# Patient Record
Sex: Male | Born: 1945 | Race: White | Hispanic: No | Smoking: Former smoker
Health system: Southern US, Community
[De-identification: ages and names within clinical notes are randomized; demographics above are authoritative.]

## PROBLEM LIST (undated history)

## (undated) DIAGNOSIS — I252 Old myocardial infarction: Secondary | ICD-10-CM

## (undated) DIAGNOSIS — I509 Heart failure, unspecified: Secondary | ICD-10-CM

## (undated) DIAGNOSIS — I739 Peripheral vascular disease, unspecified: Secondary | ICD-10-CM

## (undated) DIAGNOSIS — K5730 Diverticulosis of large intestine without perforation or abscess without bleeding: Secondary | ICD-10-CM

## (undated) DIAGNOSIS — Z860100 Personal history of colon polyps, unspecified: Secondary | ICD-10-CM

## (undated) DIAGNOSIS — C61 Malignant neoplasm of prostate: Secondary | ICD-10-CM

## (undated) DIAGNOSIS — H9190 Unspecified hearing loss, unspecified ear: Secondary | ICD-10-CM

## (undated) DIAGNOSIS — I2510 Atherosclerotic heart disease of native coronary artery without angina pectoris: Secondary | ICD-10-CM

## (undated) DIAGNOSIS — Z8719 Personal history of other diseases of the digestive system: Secondary | ICD-10-CM

## (undated) DIAGNOSIS — M1990 Unspecified osteoarthritis, unspecified site: Secondary | ICD-10-CM

## (undated) DIAGNOSIS — E049 Nontoxic goiter, unspecified: Secondary | ICD-10-CM

## (undated) DIAGNOSIS — Z955 Presence of coronary angioplasty implant and graft: Secondary | ICD-10-CM

## (undated) DIAGNOSIS — I10 Essential (primary) hypertension: Secondary | ICD-10-CM

## (undated) DIAGNOSIS — Z87442 Personal history of urinary calculi: Secondary | ICD-10-CM

## (undated) DIAGNOSIS — R7989 Other specified abnormal findings of blood chemistry: Secondary | ICD-10-CM

## (undated) DIAGNOSIS — H02409 Unspecified ptosis of unspecified eyelid: Secondary | ICD-10-CM

## (undated) DIAGNOSIS — J449 Chronic obstructive pulmonary disease, unspecified: Secondary | ICD-10-CM

## (undated) HISTORY — DX: Malignant neoplasm of prostate: C61

## (undated) HISTORY — DX: Peripheral vascular disease, unspecified: I73.9

## (undated) HISTORY — DX: Chronic obstructive pulmonary disease, unspecified: J44.9

## (undated) HISTORY — DX: Unspecified ptosis of unspecified eyelid: H02.409

## (undated) HISTORY — PX: CORONARY ARTERY BYPASS GRAFT: SHX141

## (undated) HISTORY — PX: TONSILLECTOMY: SUR1361

## (undated) HISTORY — PX: CARDIOVASCULAR STRESS TEST: SHX262

## (undated) HISTORY — DX: Atherosclerotic heart disease of native coronary artery without angina pectoris: I25.10

## (undated) HISTORY — DX: Essential (primary) hypertension: I10

## (undated) HISTORY — PX: PROSTATE BIOPSY: SHX241

## (undated) HISTORY — DX: Other specified abnormal findings of blood chemistry: R79.89

## (undated) HISTORY — PX: CORONARY ANGIOPLASTY WITH STENT PLACEMENT: SHX49

## (undated) HISTORY — PX: CARDIAC CATHETERIZATION: SHX172

---

## 1971-01-12 HISTORY — PX: APPENDECTOMY: SHX54

## 1981-01-11 HISTORY — PX: TOTAL HIP ARTHROPLASTY: SHX124

## 1988-09-11 HISTORY — PX: OTHER SURGICAL HISTORY: SHX169

## 1994-01-11 HISTORY — PX: EYE SURGERY: SHX253

## 2000-03-26 ENCOUNTER — Emergency Department (HOSPITAL_COMMUNITY): Admission: EM | Admit: 2000-03-26 | Discharge: 2000-03-26 | Payer: Self-pay | Admitting: *Deleted

## 2004-01-12 HISTORY — PX: CATARACT EXTRACTION W/ INTRAOCULAR LENS  IMPLANT, BILATERAL: SHX1307

## 2004-07-17 ENCOUNTER — Inpatient Hospital Stay (HOSPITAL_COMMUNITY): Admission: AD | Admit: 2004-07-17 | Discharge: 2004-07-30 | Payer: Self-pay | Admitting: Cardiology

## 2004-07-17 ENCOUNTER — Encounter: Payer: Self-pay | Admitting: Emergency Medicine

## 2004-07-19 ENCOUNTER — Ambulatory Visit: Payer: Self-pay | Admitting: Cardiology

## 2004-07-20 ENCOUNTER — Encounter: Payer: Self-pay | Admitting: Internal Medicine

## 2004-07-21 ENCOUNTER — Ambulatory Visit: Payer: Self-pay | Admitting: Pulmonary Disease

## 2004-08-12 ENCOUNTER — Ambulatory Visit (HOSPITAL_COMMUNITY): Admission: RE | Admit: 2004-08-12 | Discharge: 2004-08-12 | Payer: Self-pay | Admitting: *Deleted

## 2004-08-12 ENCOUNTER — Ambulatory Visit: Payer: Self-pay | Admitting: Cardiology

## 2004-08-23 ENCOUNTER — Inpatient Hospital Stay (HOSPITAL_COMMUNITY): Admission: EM | Admit: 2004-08-23 | Discharge: 2004-08-29 | Payer: Self-pay | Admitting: Emergency Medicine

## 2004-08-23 ENCOUNTER — Encounter: Payer: Self-pay | Admitting: Emergency Medicine

## 2004-08-23 ENCOUNTER — Ambulatory Visit: Payer: Self-pay | Admitting: Internal Medicine

## 2004-08-27 ENCOUNTER — Encounter: Payer: Self-pay | Admitting: Cardiology

## 2004-09-01 ENCOUNTER — Ambulatory Visit: Payer: Self-pay | Admitting: Cardiology

## 2004-09-04 ENCOUNTER — Ambulatory Visit: Payer: Self-pay | Admitting: *Deleted

## 2004-09-15 ENCOUNTER — Encounter (HOSPITAL_COMMUNITY): Admission: RE | Admit: 2004-09-15 | Discharge: 2004-10-09 | Payer: Self-pay | Admitting: Cardiology

## 2004-10-12 ENCOUNTER — Encounter (HOSPITAL_COMMUNITY): Admission: RE | Admit: 2004-10-12 | Discharge: 2004-11-11 | Payer: Self-pay | Admitting: Cardiology

## 2004-11-13 ENCOUNTER — Encounter (HOSPITAL_COMMUNITY): Admission: RE | Admit: 2004-11-13 | Discharge: 2004-12-13 | Payer: Self-pay

## 2004-12-14 ENCOUNTER — Encounter (HOSPITAL_COMMUNITY): Admission: RE | Admit: 2004-12-14 | Discharge: 2004-12-23 | Payer: Self-pay | Admitting: Cardiology

## 2005-03-01 ENCOUNTER — Emergency Department (HOSPITAL_COMMUNITY): Admission: EM | Admit: 2005-03-01 | Discharge: 2005-03-01 | Payer: Self-pay | Admitting: Emergency Medicine

## 2005-07-09 ENCOUNTER — Ambulatory Visit: Payer: Self-pay | Admitting: Internal Medicine

## 2005-07-13 ENCOUNTER — Ambulatory Visit (HOSPITAL_COMMUNITY): Admission: RE | Admit: 2005-07-13 | Discharge: 2005-07-13 | Payer: Self-pay | Admitting: Internal Medicine

## 2005-08-06 ENCOUNTER — Ambulatory Visit (HOSPITAL_COMMUNITY): Admission: RE | Admit: 2005-08-06 | Discharge: 2005-08-06 | Payer: Self-pay | Admitting: Internal Medicine

## 2005-08-06 ENCOUNTER — Ambulatory Visit: Payer: Self-pay | Admitting: Internal Medicine

## 2005-09-08 ENCOUNTER — Ambulatory Visit: Payer: Self-pay | Admitting: Internal Medicine

## 2005-09-09 ENCOUNTER — Ambulatory Visit: Payer: Self-pay | Admitting: Cardiology

## 2005-12-08 ENCOUNTER — Emergency Department (HOSPITAL_COMMUNITY): Admission: EM | Admit: 2005-12-08 | Discharge: 2005-12-09 | Payer: Self-pay | Admitting: Emergency Medicine

## 2006-10-26 ENCOUNTER — Ambulatory Visit: Payer: Self-pay | Admitting: Cardiology

## 2007-02-07 IMAGING — CR DG CHEST 1V PORT
1 series · 1 of 1 positions shown · non-contrast
Comparison: 07/24/04

CLINICAL DATA: Post CABG
 PORTABLE CHEST ? ONE VIEW:

[view not recorded]
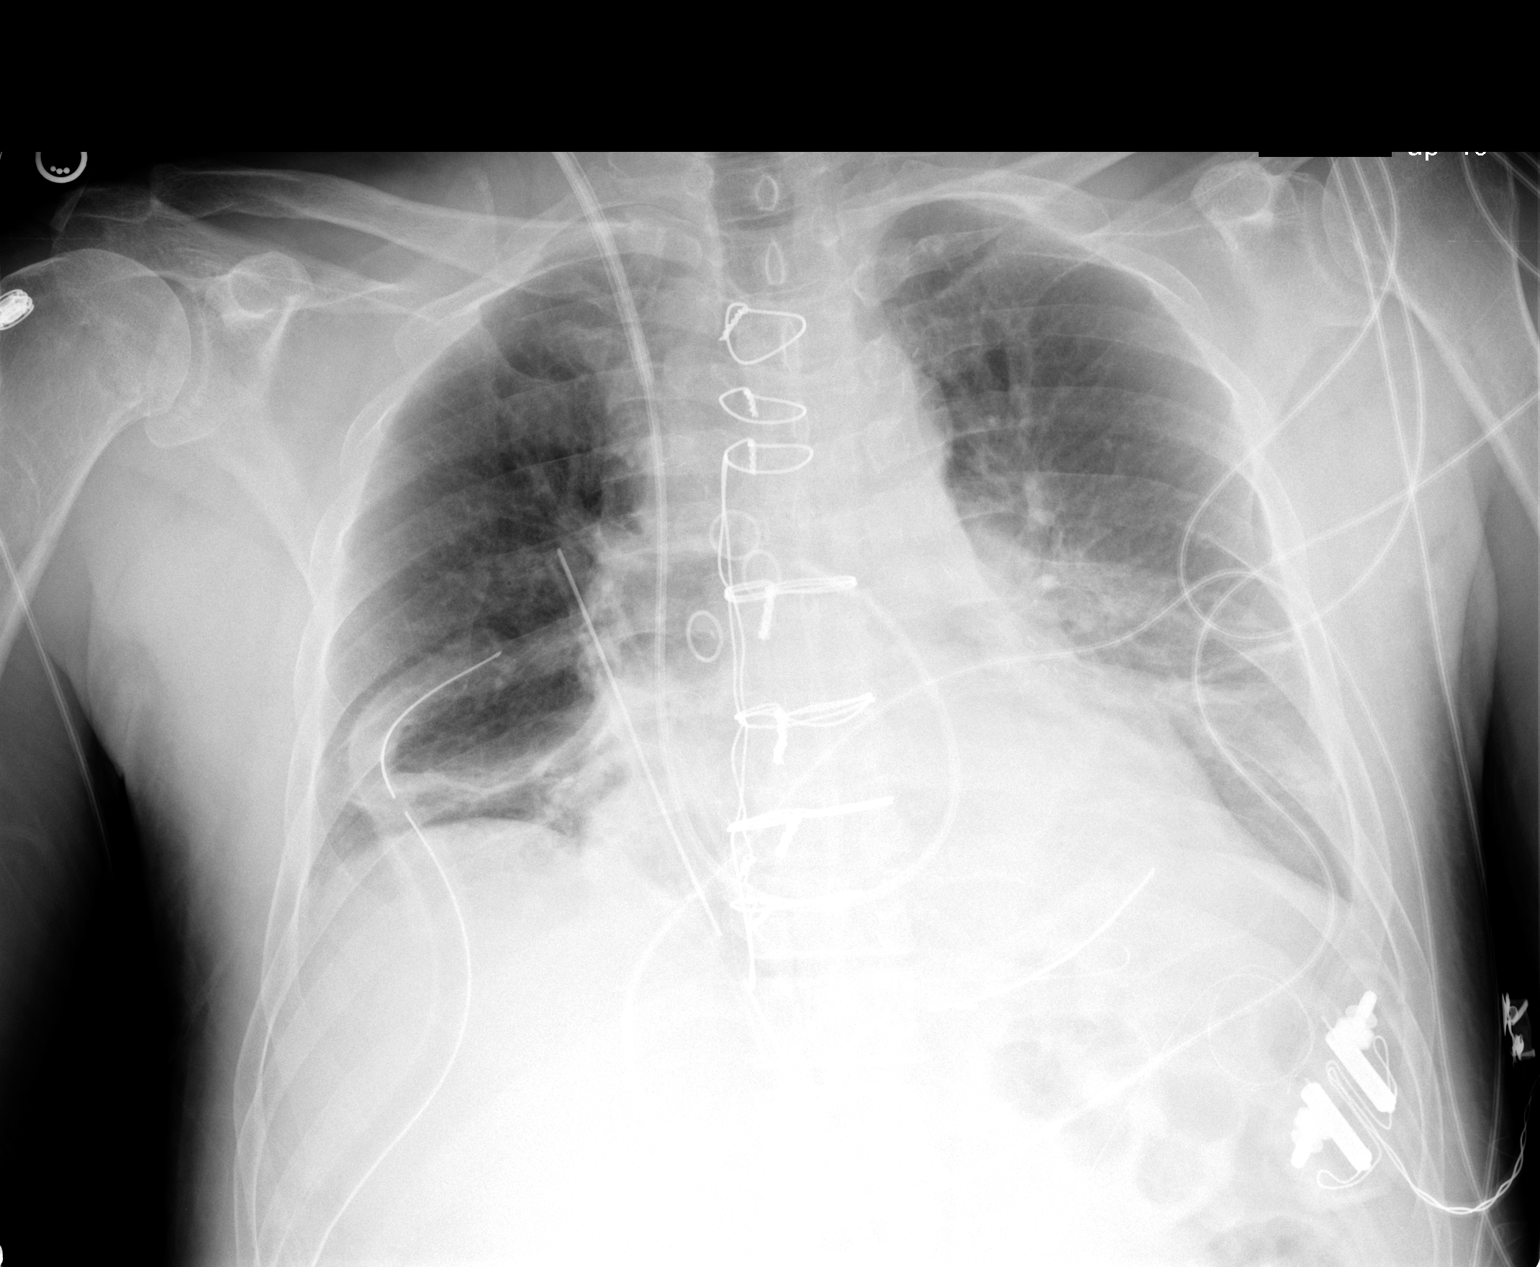

[1 of 1 positions shown; findings below may reference images not displayed]

FINDINGS: Endotracheal tube and NG tube have been removed with increasing basilar atelectasis.  Cardiomegaly, CABG, Swan Ganz catheter, and mediastinal thoracostomy tubes are stable.  Pulmonary vascular congestion and mild interstitial edema is slightly increased.  No evidence of pneumothorax.
IMPRESSION: Endotracheal tube and NG tube removal with mild increase in basilar atelectasis and interstitial edema.

## 2007-02-08 IMAGING — CR DG CHEST 1V PORT
1 series · 1 of 1 positions shown · non-contrast
Comparison: 07/25/04.

CLINICAL DATA: CABG.  
 PORTABLE CHEST - 1 VIEW 07/26/04:

[view not recorded]
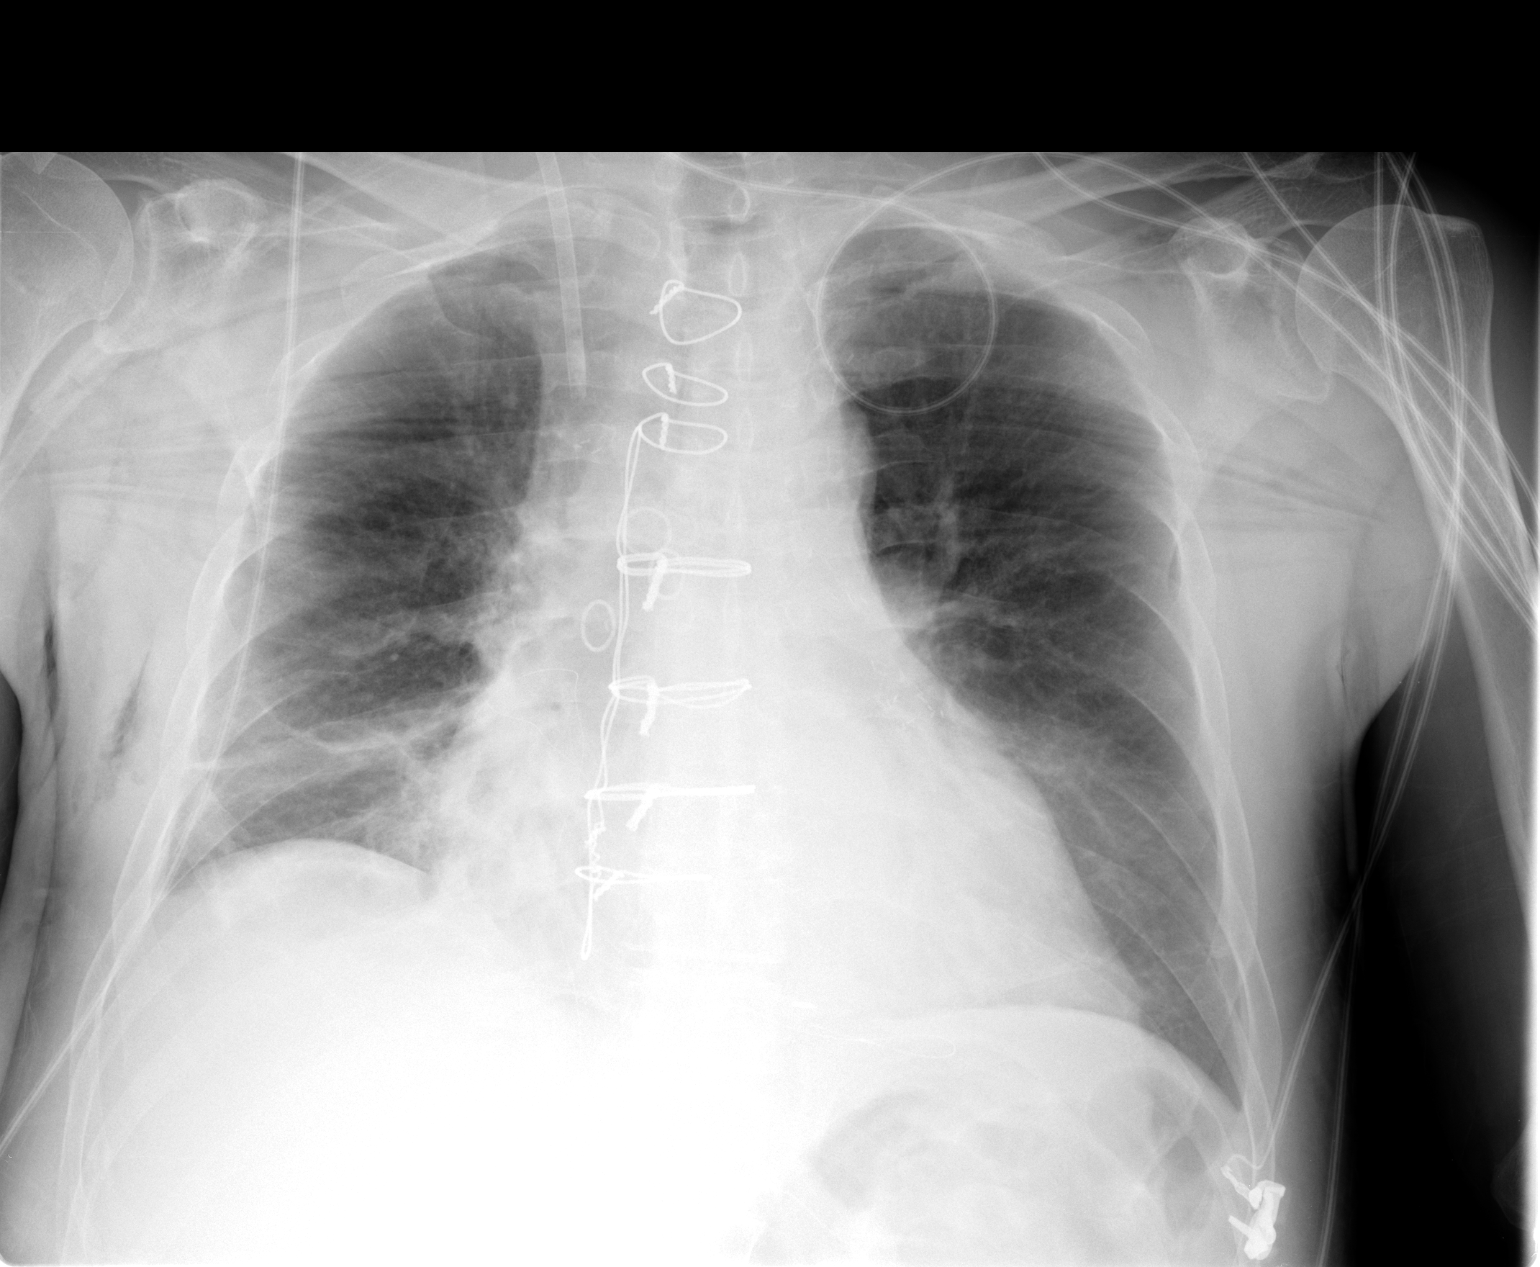

[1 of 1 positions shown; findings below may reference images not displayed]

FINDINGS: Cardiomegaly.  CABG noted.  Improved aeration of the lung bases.  Right thoracostomy tube has been removed.  No evidence of pneumothorax.  Bibasilar subcutaneous emphysema again noted.
IMPRESSION: 1.  Right thoracostomy tube removal with improved basilar aeration. 
 2.  No evidence of pneumothorax.  
 3.  Stable bilateral subcutaneous emphysema.

## 2007-02-09 IMAGING — CR DG CHEST 1V PORT
1 series · 1 of 1 positions shown · non-contrast
Comparison: 07/26/04.

CLINICAL DATA: Coronary artery disease.  Postop from coronary artery bypass grafting.
 PORTABLE [id number] VIEW:

[view not recorded]
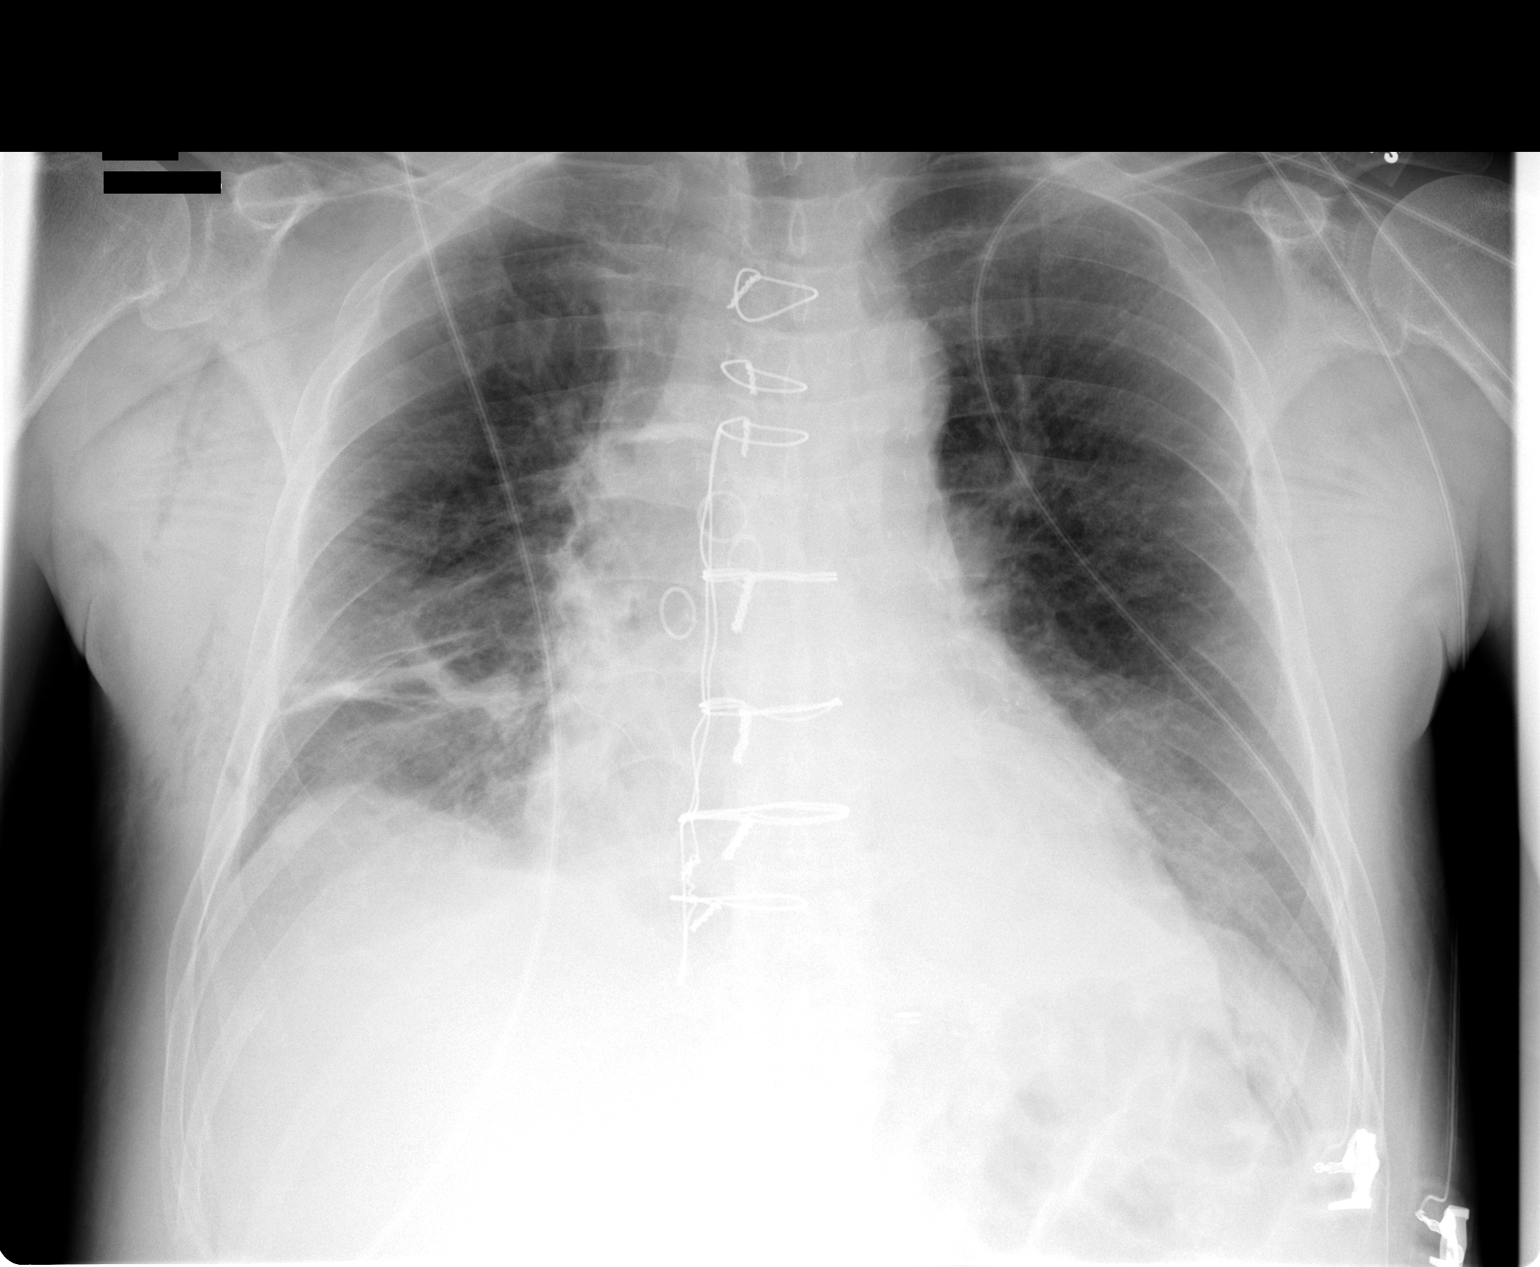

[1 of 1 positions shown; findings below may reference images not displayed]

Mild worsening of atelectasis is seen in the medial left lung base since the prior study.  Right lower lung atelectasis is unchanged.  
 Cardiomegaly is stable.  There is no evidence of congestive heart failure.  No pneumothorax identified.  Right jugular Cordis has been removed.
IMPRESSION: 1.  Mild worsening of atelectasis in the left retrocardiac lung base.  
 2.  Stable right lower lung atelectasis.

## 2007-02-10 IMAGING — CR DG CHEST 2V
2 series · 2 of 2 positions shown · non-contrast
Comparison: 07/27/04.

CLINICAL DATA: CABG.  Chest congestion.  Cough. 
 CHEST - 2 VIEW:

[w chest pa]
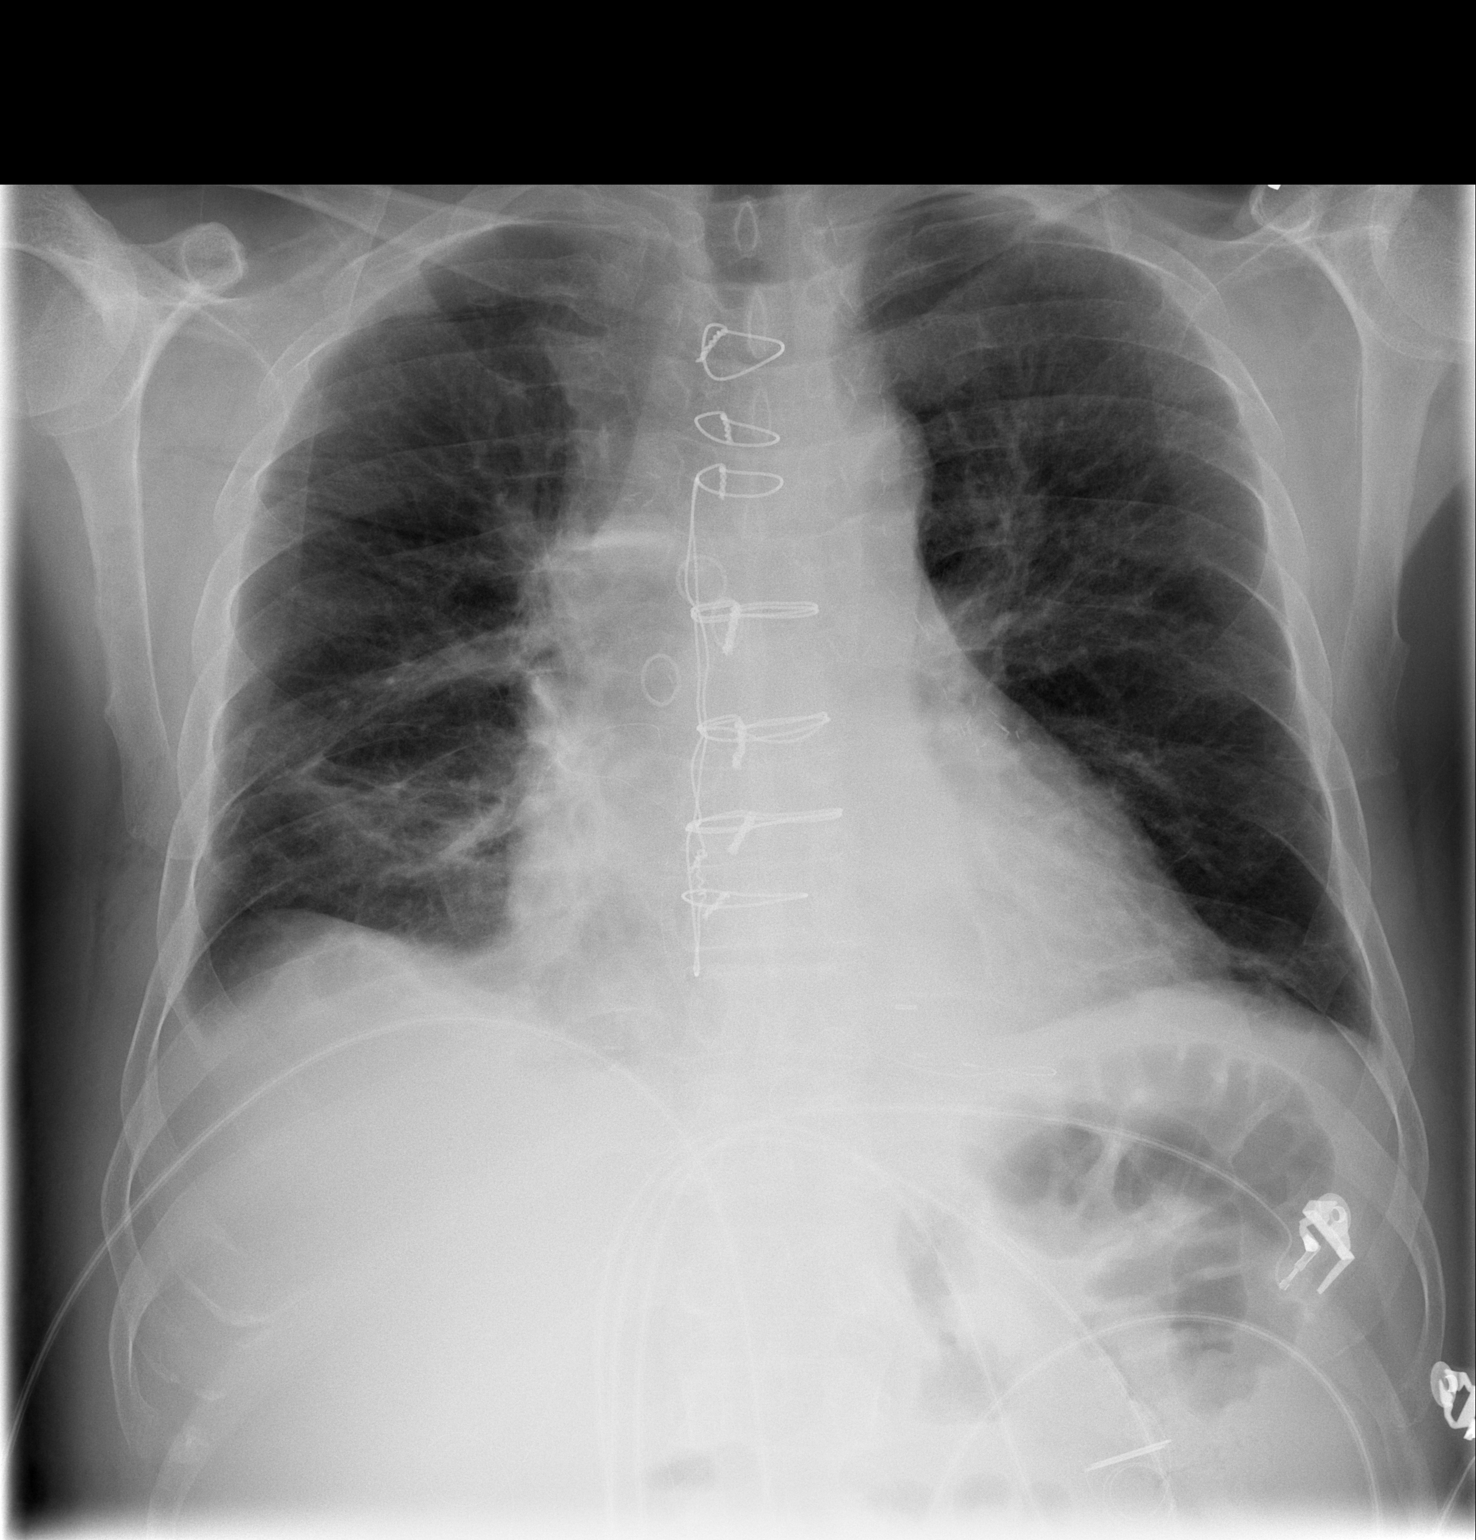

[w chest lat]
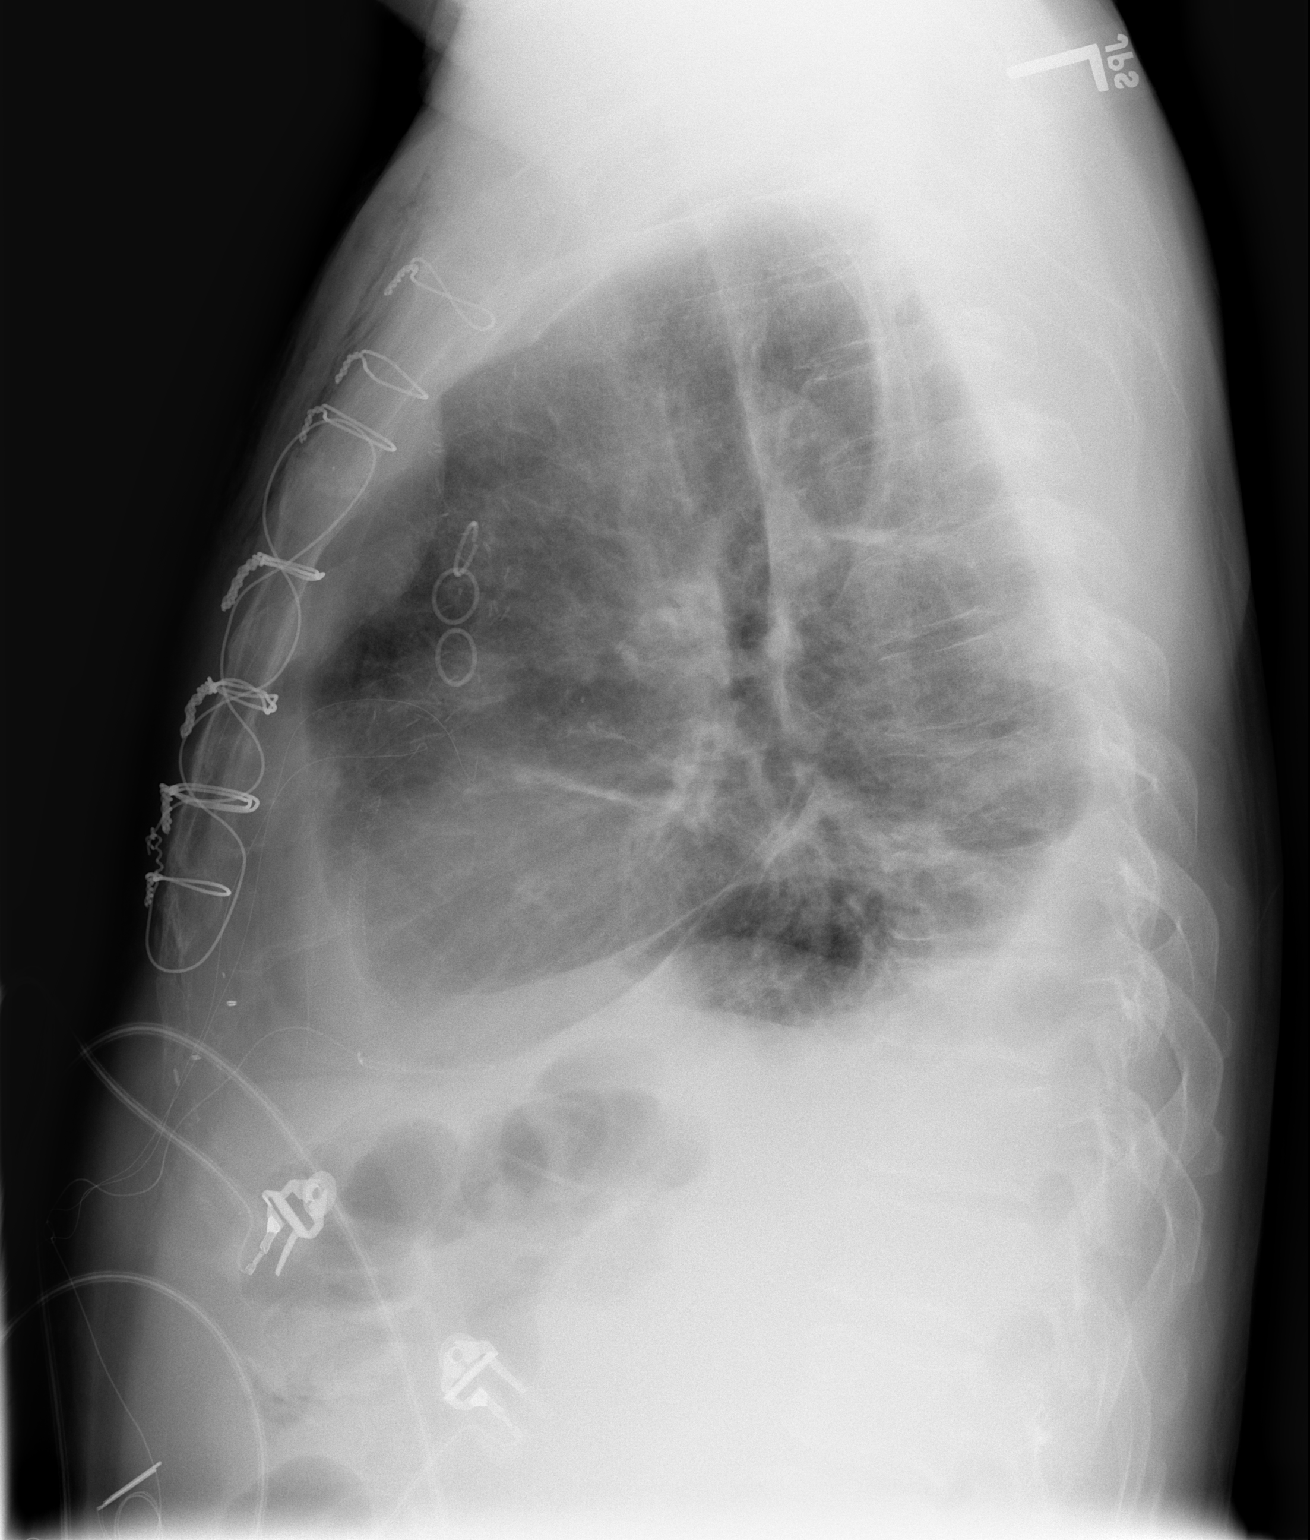

[2 of 2 positions shown; findings below may reference images not displayed]

The patient has had a previous median sternotomy and coronary artery bypass grafting.  There is mild basilar atelectasis, right more than left.  There are small bilateral pleural effusions.  Little change since yesterday's exam.
IMPRESSION: No significant change in effusion and basilar atelectasis.

## 2007-11-14 ENCOUNTER — Ambulatory Visit: Payer: Self-pay | Admitting: Cardiology

## 2008-10-08 ENCOUNTER — Encounter: Payer: Self-pay | Admitting: Cardiology

## 2008-11-04 ENCOUNTER — Encounter (INDEPENDENT_AMBULATORY_CARE_PROVIDER_SITE_OTHER): Payer: Self-pay | Admitting: *Deleted

## 2008-11-04 ENCOUNTER — Encounter: Payer: Self-pay | Admitting: Cardiology

## 2008-11-04 LAB — CONVERTED CEMR LAB
Alkaline Phosphatase: 78 units/L
Alkaline Phosphatase: 78 units/L (ref 39–117)
BUN: 17 mg/dL
CO2: 23 meq/L
Chloride: 106 meq/L
Cholesterol: 92 mg/dL
Cholesterol: 92 mg/dL (ref 0–200)
Creatinine, Ser: 0.99 mg/dL
Glucose, Bld: 97 mg/dL
Glucose, Bld: 97 mg/dL (ref 70–99)
HDL: 38 mg/dL — ABNORMAL LOW (ref >39)
Indirect Bilirubin: 0.7 mg/dL (ref 0.0–0.9)
Sodium: 140 meq/L (ref 135–145)
Total Bilirubin: 1 mg/dL (ref 0.3–1.2)
Total CHOL/HDL Ratio: 2.4
Total Protein: 6.7 g/dL (ref 6.0–8.3)

## 2008-11-06 ENCOUNTER — Encounter (INDEPENDENT_AMBULATORY_CARE_PROVIDER_SITE_OTHER): Payer: Self-pay | Admitting: *Deleted

## 2008-11-25 ENCOUNTER — Encounter: Payer: Self-pay | Admitting: Cardiology

## 2008-12-09 DIAGNOSIS — J449 Chronic obstructive pulmonary disease, unspecified: Secondary | ICD-10-CM

## 2008-12-09 DIAGNOSIS — I10 Essential (primary) hypertension: Secondary | ICD-10-CM

## 2008-12-11 ENCOUNTER — Ambulatory Visit: Payer: Self-pay | Admitting: Cardiology

## 2008-12-23 ENCOUNTER — Encounter (INDEPENDENT_AMBULATORY_CARE_PROVIDER_SITE_OTHER): Payer: Self-pay | Admitting: *Deleted

## 2008-12-25 ENCOUNTER — Encounter: Payer: Self-pay | Admitting: Cardiology

## 2009-09-02 ENCOUNTER — Telehealth (INDEPENDENT_AMBULATORY_CARE_PROVIDER_SITE_OTHER): Payer: Self-pay

## 2009-10-30 ENCOUNTER — Ambulatory Visit (HOSPITAL_COMMUNITY)
Admission: RE | Admit: 2009-10-30 | Discharge: 2009-10-30 | Payer: Self-pay | Source: Home / Self Care | Admitting: Family Medicine

## 2009-10-31 LAB — CONVERTED CEMR LAB
ALT: 13 units/L
Albumin: 3.9 g/dL
BUN: 15 mg/dL
CO2: 23 meq/L
Chloride: 109 meq/L
Glucose, Bld: 104 mg/dL
HCT: 47.3 %
HDL: 32 mg/dL
Hemoglobin: 16.3 g/dL
MCV: 82.3 fL
PSA: 3.68 ng/mL
Platelets: 196 10*3/uL
Potassium: 4.5 meq/L
TSH: 0.874 microintl units/mL
Total Protein: 5.8 g/dL
WBC: 6.7 10*3/uL

## 2009-12-03 ENCOUNTER — Ambulatory Visit: Payer: Self-pay | Admitting: Internal Medicine

## 2009-12-19 ENCOUNTER — Encounter (INDEPENDENT_AMBULATORY_CARE_PROVIDER_SITE_OTHER): Payer: Self-pay | Admitting: *Deleted

## 2009-12-26 ENCOUNTER — Ambulatory Visit (HOSPITAL_COMMUNITY)
Admission: RE | Admit: 2009-12-26 | Discharge: 2009-12-26 | Payer: Self-pay | Source: Home / Self Care | Attending: Internal Medicine | Admitting: Internal Medicine

## 2009-12-26 HISTORY — PX: COLONOSCOPY W/ POLYPECTOMY: SHX1380

## 2009-12-29 ENCOUNTER — Encounter (INDEPENDENT_AMBULATORY_CARE_PROVIDER_SITE_OTHER): Payer: Self-pay | Admitting: *Deleted

## 2009-12-29 ENCOUNTER — Ambulatory Visit: Payer: Self-pay | Admitting: Cardiology

## 2009-12-29 DIAGNOSIS — K635 Polyp of colon: Secondary | ICD-10-CM | POA: Insufficient documentation

## 2009-12-30 ENCOUNTER — Encounter: Payer: Self-pay | Admitting: Cardiology

## 2009-12-31 ENCOUNTER — Encounter: Payer: Self-pay | Admitting: Internal Medicine

## 2010-01-09 ENCOUNTER — Telehealth: Payer: Self-pay | Admitting: Cardiovascular Disease

## 2010-02-01 ENCOUNTER — Encounter: Payer: Self-pay | Admitting: Cardiothoracic Surgery

## 2010-02-12 NOTE — Miscellaneous (Signed)
Summary: LABS CMP,LIVER,11/04/2008  Clinical Lists Changes  Observations: Added new observation of CALCIUM: 9.4 mg/dL (60/45/4098 1:19) Added new observation of ALBUMIN: 4.4 g/dL (14/78/2956 2:13) Added new observation of PROTEIN, TOT: 6.7 g/dL (08/65/7846 9:62) Added new observation of SGPT (ALT): 10 units/L (11/04/2008 8:04) Added new observation of SGOT (AST): 9 units/L (11/04/2008 8:04) Added new observation of ALK PHOS: 78 units/L (11/04/2008 8:04) Added new observation of CREATININE: 0.99 mg/dL (95/28/4132 4:40) Added new observation of BUN: 17 mg/dL (11/07/2534 6:44) Added new observation of BG RANDOM: 97 mg/dL (03/47/4259 5:63) Added new observation of CO2 PLSM/SER: 23 meq/L (11/04/2008 8:04) Added new observation of CL SERUM: 106 meq/L (11/04/2008 8:04) Added new observation of K SERUM: 4.4 meq/L (11/04/2008 8:04) Added new observation of NA: 140 meq/L (11/04/2008 8:04) Added new observation of LDL: 38 mg/dL (87/56/4332 9:51) Added new observation of HDL: 38 mg/dL (88/41/6606 3:01) Added new observation of TRIGLYC TOT: 80 mg/dL (60/10/9321 5:57) Added new observation of CHOLESTEROL: 92 mg/dL (32/20/2542 7:06)

## 2010-02-12 NOTE — Letter (Signed)
Summary: Patient Notice, Colon Biopsy Results  [facility]  [address]   [place], [place] [address]   Phone: [phone]  Fax: [phone]       December 31, 2009   Vincent Carter [address] [place], [place]  [address] 07-23-1945    Dear Vincent Carter,  I am pleased to inform you that the biopsies taken during your recent colonoscopy did not show any evidence of cancer upon pathologic examination.  Additional information/recommendations:  No further action is needed at this time.  Please follow-up with your primary care physician for your other healthcare needs.  You should have a repeat colonoscopy examination  in 10 years.  Please call us if you are having persistent problems or have questions about your condition that have not been fully answered at this time.  Sincerely,    R. Roetta Sessions MD, FACP [facility] Ph: [phone]    Fax: [phone]   Appended Document: Patient Notice, Colon Biopsy Results letter mailed to pt  Appended Document: Patient Notice, Colon Biopsy Results reminder in computer

## 2010-02-12 NOTE — Progress Notes (Signed)
Summary: Refill   Phone Note Call from Patient   Caller: Patient Reason for Call: Refill Medication Summary of Call: pt needs refill on Metoprolol called to [facility]/tg Initial call taken by: Raechel Ache [facility],  September 02, 2009 11:03 AM    Prescriptions: LOPRESSOR 50 MG TABS (METOPROLOL TARTRATE) Take 1/2 tablet by mouth two times a day  #30 x 3   Entered by:   Larita Fife Via LPN   Authorized by:   Kathlen Brunswick, MD, [facility]   Signed by:   Larita Fife Via LPN on 91/47/8295   Method used:   Electronically to        [facility] (retail)       [address] [place], [place]  [address]       Ph: [phone]       Fax: [phone]   RxID:   [id number]

## 2010-02-12 NOTE — Letter (Signed)
Summary: TCS ORDER  TCS ORDER   Imported By: Rexene Alberts 12/03/2009 12:15:20  _____________________________________________________________________  External Attachment:    Type:   Image     Comment:   External Document

## 2010-02-12 NOTE — Assessment & Plan Note (Signed)
Summary: consult for tcs/constipation,diarrhea/ss   Visit Type:  Initial Consult Referring Provider:  Sherwood Gambler Primary Care Provider:  Fusco  Chief Complaint:  diarrhea/ ?TCS.  History of Present Illness: 65 year old gentleman referred out of the courtesy of [facility] for consideration of colonoscopy given his change in bowel habits.  Patient states he has alternating constipation and diarrhea ;may have up to  3 days of nonbloody watery diarrhea ; then   relatively normal bowel function for a week or so and then gets constipated. He has seen a little blood with wiping on occasion; hasn't really had any associated abdominal pain. Last colonoscopy was done back in 2007 by Dr. Karilyn Cota - diverticulosis. He has a history of self-limited bouts of diarrheal illness previously back in the 1990s; he actually had a Giardia infection to account for his diarrhea at that time. No family history of inflammatory bowel disease, polyps or CRC.  Preventive Screening-Counseling & Management  Alcohol-Tobacco     Smoking Status: current     Packs/Day: 0.5  Caffeine-Diet-Exercise     Does Patient Exercise: no  Current Medications (verified): 1)  Lopressor 50 Mg Tabs (Metoprolol Tartrate) .... Take 1/2 Tablet By Mouth Two Times A Day 2)  Aspir-Low 81 Mg Tbec (Aspirin) .... Take 1 Tab Two Times A Day 3)  Fish Oil 300 Mg Caps (Omega-3 Fatty Acids) .... Take 2 Tabs Two Times A Day  Allergies (verified): No Known Drug Allergies  Past History:  Past Medical History: Last updated: 12/11/2008 ASCVD: Coronary artery bypass graft surgery in 1997 with LIMA to the left anterior descending;       redo surgery in 07/2004 for three-vessel disease; cath in 8/06-ejection fraction of 40-45% with       anteroapical hypokinesis; occlusion of the saphenous vein graft to the RI; patent RIMA to left anterior       descending; unsuccessful PCI in totally occluded RI graft. Tobacco abuse-40 pack years  discontinued in 2006; resumed 2009 COPD (ICD-496) HYPERTENSION (ICD-401.9) HYPERLIPIDEMIA (ICD-272.4) Left ptosis secondary to injury Euthyroid with low thyroid-stimulating hormone  Past Surgical History: Last updated: 12/11/2008 Appendectomy ORIF for right tibia and fibular fracture Surgical repair of left ocular injury Coronary artery bypass graft surgery 1997 and 2006  Family History: Last updated: 12/26/08 Father:deceased age 35 due to lung cancer Mother:deceased age 43 yrs heart attack and blood clot Siblings:1 brother alive and well   Social History: Last updated: December 26, 2008 Retired  Widowed  Tobacco Use - No.  Alcohol Use - no Regular Exercise - no Drug Use - no  Risk Factors: Exercise: no (12/03/2009)  Risk Factors: Smoking Status: current (12/03/2009) Packs/Day: 0.5 (12/03/2009)  Social History: Smoking Status:  current Packs/Day:  0.5  Vital Signs:  Patient profile:   65 year old male Height:      62 inches Weight:      145 pounds BMI:     26.62 Temp:     98.5 degrees F oral Pulse rate:   56 / minute BP sitting:   140 / 80  (left arm) Cuff size:   regular  Vitals Entered By: Cloria Spring LPN (December 03, 2009 11:07 AM)  Physical Exam  General:  pleasant alert gentleman resting comfortably Lungs:  clear to auscultation Heart:  regular rate rhythm without murmur gallop well  Impression & Recommendations: Impression: Pleasant 65 year old gentleman with alternating constipation and diarrhea. Occasional paper hematochezia. 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  It  has been some time since he last  had a colonoscopy. I agree, further evaluation is warranted.  Recommendations: Diagnostic colonoscopy the near future. Risks, benefits, limitations, alternatives and imponderables have been reviewed. Patient's questions have been answered. He is agreeable.  Further recommendations after  colonoscopy has been performed.  Appended Document: Orders Update    Clinical Lists Changes  Problems: Added new problem of CHANGE IN BOWELS (ICD-787.99) Added new problem of HEMATOCHEZIA (ICD-578.1) Orders: Added new Service order of New Patient Level III [phone]) - Signed

## 2010-02-12 NOTE — Letter (Signed)
Summary: Patient Notice, Colon Biopsy Results  [facility]  [address]   [place], [place] [address]   Phone: [phone]  Fax: [phone]       December 31, 2009   Vincent Carter [address] [place], [place]  [address] 30-Apr-1945    Dear [name],  I am pleased to inform you that the biopsies taken during your recent colonoscopy did not show any evidence of cancer upon pathologic examination.  Additional information/recommendations:  No further action is needed at this time.  Please follow-up with your primary care physician for your other healthcare needs.  Continue with the treatment plan as outlined on the day of your exam.  You should have a repeat colonoscopy examination  in 10 years.  Please call us if you are having persistent problems or have questions about your condition that have not been fully answered at this time.  Sincerely,    R. Roetta Sessions MD, FACP [facility] Ph: [phone]    Fax: [phone]   Appended Document: Patient Notice, Colon Biopsy Results letter mailed to pt  Appended Document: Patient Notice, Colon Biopsy Results REMINDER IN COMPUTER

## 2010-02-12 NOTE — Letter (Signed)
Summary: Appointment - Reminder 2  [facility] [address]   [place], [place] [address]   Phone: [phone]  Fax: [phone]     December 19, 2009 MRN: [id number]   Vincent Carter [address] [place], [place]  [address]   Dear [name],  Our records indicate that it is time to schedule a follow-up appointment.  Dr.   Dietrich Pates       recommended that you follow up with [place] in   12.2011         . It is very important that we reach you to schedule this appointment. We look forward to participating in your health care needs. Please contact us at the number listed above at your earliest convenience to schedule your appointment.  If you are unable to make an appointment at this time, give [place] a call so we can update our records.     Sincerely,   Glass blower/designer

## 2010-02-12 NOTE — Progress Notes (Signed)
Summary: question on meds   Phone Note From Pharmacy Call back at [phone]   Caller: [facility]*/april Summary of Call: question on NITROGLYCERIN  Initial call taken by: Roe Coombs,  January 09, 2010 2:27 PM  Follow-up for Phone Call        I spoke with April at [facility].  She will fill a prescription for Nitroglycerin 0.4 take as directed for chest pain.  Instead of e-scribed Nitrodur patch take as needed for chest pain. I will forward to Dr. Eden Emms & his nurse Stanton Kidney. Mylo Red RN     Appended Document: question on meds I spoke with pt he did not rceive the patches HE DID RECEIVE THE PILLS IN WHICH HE STATED THE CO PAY WAS ( DOLLARS FOR ONE) BOTTLE- SO THEREFORE I DID NOT HAVE TO RESEND THE RX. I ALSO CALLED THE PHARMACY IT WAS FILLED CORRECTLY AND GIVEN TO THE PT>   Clinical Lists Changes       Appended Document: question on meds S.L. is what should have been scripted

## 2010-02-12 NOTE — Assessment & Plan Note (Signed)
Summary: 1 YR FU PER PATIENT WALK IN/SN  Medications Added NITRO-DUR 0.6 MG/HR PT24 (NITROGLYCERIN) take as needed for chest pain      Allergies Added: NKDA  Visit Type:  Follow-up Referring Provider:  . Primary Provider:  Dr. Sherwood Gambler   History of Present Illness: [name] returns to the office for continued assessment and treatment of coronary disease and cardiovascular risk factors.  Since his last visit, he has done superbly.  He is relatively inactive, but has noted no chest discomfort, no dyspnea, no orthopnea and no PND.  Current Medications (verified): 1)  Lopressor 50 Mg Tabs (Metoprolol Tartrate) .... Take 1/2 Tablet By Mouth Two Times A Day 2)  Aspir-Low 81 Mg Tbec (Aspirin) .... Take 1 Tab Two Times A Day 3)  Fish Oil 300 Mg Caps (Omega-3 Fatty Acids) .... Take 2 Tabs Two Times A Day 4)  Nitro-Dur 0.6 Mg/hr Pt24 (Nitroglycerin) .... Take As Needed For Chest Pain  Allergies (verified): No Known Drug Allergies  Comments:  Nurse/Medical Assistant: patient uses [facility] no meds no list he reviewed meds from previous ov and stated these are the only meds he takes needs refill on nitro  Past History:  PMH, FH, and Social History reviewed and updated.  Past Medical History: ASCVD: Coronary artery bypass graft surgery in 1997 with LIMA to the left anterior descending;       redo surgery in 07/2004 for three-vessel disease; cath in 8/06-ejection fraction of 40-45% with       anteroapical hypokinesis; occlusion of the saphenous vein graft to the RI; patent RIMA to left anterior       descending; unsuccessful PCI in totally occluded RI graft. Tobacco abuse-40 pack years discontinued in 2006; resumed 2009 COPD (ICD-496) HYPERTENSION (ICD-401.9) HYPERLIPIDEMIA (ICD-272.4) Left ptosis secondary to injury Euthyroid with low thyroid-stimulating hormone Colonic polyps-excised in 2011 Diverticulosis-history of diverticulitis  Past Surgical  History: Appendectomy ORIF for right tibia and fibular fracture Surgical repair of left ocular injury Coronary artery bypass graft surgery 1997 and 2006 Colonoscopy-2011; polyps excised  Review of Systems       See history of present illness.  Vital Signs:  Patient profile:   65 year old male Weight:      143 pounds O2 Sat:      96 % on Room air Pulse rate:   56 / minute BP sitting:   126 / 72  (right arm)  Vitals Entered By: Dreama Saa, CNA (December 29, 2009 3:13 PM)  O2 Flow:  Room air  Physical Exam  General:  Proportionate weight and height; well developed; no acute distress: HEENT-left ptosis   Neck-No JVD; no carotid bruits: Lungs-No rales; no rhonchi; no wheezes; prolonged expiratory phase Cardiovascular-normal PMI; normal S1 and S2; fourth heart sound present Abdomen-BS normal; soft and non-tender without masses or organomegaly:  Musculoskeletal-No deformities, no cyanosis or clubbing: Neurologic-Normal cranial nerves; symmetric strength and tone:  Skin-Warm, no significant lesions: Extremities-normal distal pulses; no edema:     Impression & Recommendations:  Problem # 1:  ATHEROSCLEROTIC CARDIOVASCULAR DISEASE (ICD-429.2) Patient has done well following graft occlusion soon after his second CABG surgery 5 years ago.     Problem # 2:  COPD (ICD-496) Cigarette consumption continues, albeit at a reduced rate.  Patient was once again advised to discontinue tobacco use, which he agrees is necessary.  Problem # 3:  HYPERTENSION (ICD-401.9) Blood pressure control is good; current medications will be continued.  Recent laboratory studies were obtained from  [facility] and reviewed.  All values were entirely normal including very low total cholesterol and LDL cholesterol.  Patient is cautioned to call for development of any cardiovascular symptoms.  Otherwise, I will plan to reassess this nice gentleman in one year.  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   Patient Instructions: 1)  Your physician recommends that you schedule a follow-up appointment in: 1 YR

## 2010-05-26 NOTE — Letter (Signed)
November 14, 2007    Patrica Duel, MD  [address]  [place], [place] [address]   RE:  Vincent Carter, Vincent Carter  MRN:  [id number]  /  DOB:  June 29, 1945   Dear Loraine Leriche,   Vincent Carter returns to the office for continued assessment and  treatment of coronary artery disease and cardiovascular risk factors.  Over the past year, he has had no contact whatsoever with physicians or  hospitals.  He does his yard work and other chores around the house  without cardiopulmonary symptoms.  Blood pressure control has been good  as far as he knows.  Unfortunately, he has resumed cigarette smoking,  but claims minimal consumption.   CURRENT MEDICATIONS:  1. Metoprolol 25 mg b.i.d.  2. Aspirin 81 mg daily.  3. Fish oil 2 capsules b.i.d.  4. Gemfibrozil 600 mg b.i.d.   PHYSICAL EXAMINATION:  GENERAL:  A healthy-appearing trim gentleman.  VITAL SIGNS:  The weight is 149, 5 pounds less than last year.  Blood  pressure 125/80, heart rate 65 and regular, and respirations 14.  NECK:  No jugular venous distention; no carotid bruits.  ENDOCRINE:  No thyromegaly.  LUNGS:  Clear.  CARDIAC:  Normal first and second heart sounds; fourth heart sound  present.  ABDOMEN:  Soft and nontender; no bruits; aortic pulsation not palpable.  EXTREMITIES:  Distal pulses intact; no edema.   LABORATORY DATA:  Lipid profile obtained late last year was excellent  with total cholesterol of 103, triglycerides of 59, HDL 47, and LDL 44.   IMPRESSION:  Vincent Carter is doing beautifully.  He will continue his  current medications and return next year for repeat lipid profile and  reassessment.  Hypertension is well controlled as his hyperlipidemia.  He has no symptoms to suggest recurrent myocardial ischemia.    Sincerely,      Gerrit Friends. Dietrich Pates, MD, [facility]  Electronically Signed    RMR/MedQ  DD: 11/14/2007  DT: 11/15/2007  Job #: [id number]

## 2010-05-26 NOTE — Letter (Signed)
October 26, 2006    Patrica Duel, M.D.  [address]  [place] [address]   RE:  DARRYN, KYDD  MRN:  [id number]  /  DOB:  1945/12/04   Dear Loraine Leriche:   The patient returns to the office for management of cardiovascular risk  factors, now two years following his most recent coronary artery bypass  graft procedure.  He continues to do well with essentially no  cardiopulmonary symptoms.  He continues to refrain from cigarette  smoking.  He has gained some weight, but generally feels well.   CURRENT MEDICATIONS:  1. Metoprolol 25 mg b.i.d.  2. Aspirin 162 mg b.i.d.  3. Fish Oil one capsule daily.   PHYSICAL EXAMINATION:  GENERAL:  Short in stature, slightly overweight  gentleman in no acute distress.  VITAL SIGNS:  Weight is 154, 2 pounds more than last year, blood  pressure 110/75, heart rate 60 and regular, respirations 16.  NECK:  No jugular venous distention; no carotid bruits.  HEENT:  Ptosis on the left with previous corneal injury.  LUNGS:  Clear.  HEART:  Normal first and second heart sounds; fourth heart sound  present.  ABDOMEN:  Soft and nontender; no organomegaly.  EXTREMITIES:  Distal pulses intact; no edema.   Most recent lipid profile is from 2007 revealing total cholesterol of  125, triglycerides 206, HDL 32, and LDL 52.   IMPRESSION:  The patient is doing well at the present time.  His lipid  profile does not demand treatment, but HDL is somewhat low.  We will add  gemfibrozil 600 mg b.i.d.  He will increase Fish Oil to two capsules  b.i.d.  He will decrease aspirin to 81 mg daily.  Lipid profile will be  obtained in two months with a return visit in one year.    Sincerely,      Gerrit Friends. Dietrich Pates, MD, [facility]  Electronically Signed    RMR/MedQ  DD: 10/26/2006  DT: 10/27/2006  Job #: [id number]

## 2010-05-29 NOTE — Procedures (Signed)
NAME:  DONTARIUS, SHELEY NO.:  [id number]   MEDICAL RECORD NO.:  [id number]          PATIENT TYPE:  INP   LOCATION:  4735                         FACILITY:  [facility]   PHYSICIAN:  Edward L. Juanetta Gosling, M.D.DATE OF BIRTH:  March 20, 1945   DATE OF PROCEDURE:  DATE OF DISCHARGE:                                EKG INTERPRETATION   On August 23, 2004.  The rhythm is sinus rhythm, rate in the 60s.  The axis  is leftward  but it does not meet criteria for left axis deviation.  There  are marked T-wave abnormalities inferiorly with some ST elevation laterally  suggestive of an acute lateral myocardial infarction and clinical  correlation is suggested. Abnormal electrocardiogram.      Oneal Deputy. Juanetta Gosling, M.D.  Electronically Signed     ELH/MEDQ  D:  08/24/2004  T:  08/24/2004  Job:  [id number]
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
## 2010-05-29 NOTE — Cardiovascular Report (Signed)
NAME:  Vincent Carter, Vincent Carter NO.:  [id number]   MEDICAL RECORD NO.:  [id number]          PATIENT TYPE:  INP   LOCATION:  2901                         FACILITY:  [facility]   PHYSICIAN:  Arturo Morton. Riley Kill, M.D. [facility] OF BIRTH:  [birth year]   DATE OF PROCEDURE:  08/24/2004  DATE OF DISCHARGE:                              CARDIAC CATHETERIZATION   INDICATIONS:  Vincent Carter is a 65 year old gentleman who presented with an  acute inferior wall infarction approximately one month ago.  At that time he  will underwent urgent intervention of the right coronary artery in the  setting of RV infarction and shock.  Just after this, the patient underwent  revascularization surgery.  He had had previous LIMA to the LAD.  At this  time the right internal mammary was placed to left anterior descending  artery and vein grafts were placed to the obtuse marginal, intermediate, and  PDA.  He has had some right arm discomfort in the last two to three days.  He presented last night and was transferred to [facility].  His enzymes  turned positive.  He was brought to the catheterization laboratory for  further evaluation this morning.   PROCEDURE:  1.  Left heart catheterization.  2.  Selective coronary arteriography.  3.  Selective left ventriculography.  4.  Saphenous vein graft angiography x3.  5.  Selective right internal mammary artery angiography.  6.  Attempted percutaneous intervention of a clotted saphenous vein graft to      the immediate.   DESCRIPTION OF PROCEDURE:  The patient was brought to the catheterization  laboratory and prepped and draped in usual fashion.  Through an anterior  puncture the right femoral artery was entered.  A 6-French sheath was  placed.  Views of the left and right coronary arteries were obtained in  multiple angiographic projections.  All three vein grafts were injected.  We  then took a catheter up into the right subclavian.  Had difficulty even  despite getting a mammary artery up near the area of the IM of actually  cannulating the vessel.  The catheter kept slipping back.  We then put a  guiding catheter up near the ostium and did multiple forceful injections to  demonstrate angiographically the findings of the right internal mammary.  The acute findings included an occluded saphenous vein graft to the  intermediate which appeared to be fresh.  Central aortic and left  ventricular pressures were measured with a pigtail and ventriculography was  performed in the RAO projection.  I then reviewed all of the old films and  compared them to the current films.  I  discussed the options with the  patient which included either continuing medical therapy or an attempt at  opening of the vein graft.  Because we were unsure as to the mechanism we  elected to try to pass a wire and open the vein graft at the site of total  occlusion.  A JR4 guiding catheter with side holes was then utilized to  engage the intermediate vein graft ostium.  Heparin and Integrilin had been  previously  given and the heparin was optimized to get the ACT between 200  and 300 seconds.  A high-torque floppy was passed across the area.  We were  unable to get a thrombectomy catheter down this area.  We did pass a 2 mm  Maverick balloon up and down the area and several dilatations were done, but  with absolutely no change in flow.  We tried using a left bypass guide to  get the thrombectomy catheter down but this was also unsuccessful.  We were  eventually able to use an AL1 Cordis guiding catheter to engage the ostium  and then pass the Diver CE thrombectomy catheter down into the distal most  portion of the graft.  The graft was then thrombectomized from distal to  proximal with recovery of some fragments but there was absolutely no change  in flow.  Attempts to open the graft were then abandoned.  The patient  remained stable throughout.  I discussed the case with  his family.  He was  taken to holding area in satisfactory clinical condition after removal all  catheters.   HEMODYNAMIC DATA:  1.  Central aortic pressure 117/78, mean 95.  2.  Left ventricular pressure 111/12.  3.  No gradient on pullback across the aortic valve.   ANGIOGRAPHIC DATA:  1.  Ventriculography was performed in the RAO projection.  The anteroapical      segment appeared to be severely hypokinetic.  This is in the area of      infarction and changed from the previous study.  Ejection fraction would      be estimated in the 40-45% range.  2.  The left main has an 80% distal stenosis.  3.  The LAD has an 80% stenosis and competitive flow distally.  4.  The right internal mammary was not selectively opacified, but was      reasonably well seen and appeared to have good flow into the distal LAD      with good distal flow.  5.  The ramus intermedius has a long 80% stenosis.  It then has a somewhat      upward takeoff that looks compromised.  There is a small flash of      retrograde filling of the graft.  6.  The vein graft to the intermediate is clotted just beyond the ostium  7.  The circumflex marginal is totally occluded.  8.  The graft to the marginal appears to be intact.  It fills a superior      branch which then retrograde fills an inferior branch.  9.  The right coronary artery has about an 80% area of narrowing proximally      and then some narrowing in the stent sites.  There is a 70% stenosis at      the crux.  10. The vein graft to the distal right is intact.   CONCLUSIONS:  1.  Mild/moderate reduction in left ventricular function with an      anteroapical wall motion abnormality.  2.  Fresh occlusion of the saphenous vein graft to the intermediate vessel,      question related to anastomotic closure.  3.  Continued patency of the saphenous vein graft to the posterior      descending artery. 4.  Continued patency of the right internal mammary to the left  anterior      descending.  5.  Unsuccessful attempt to thrombectomize and dilate the vein graft to the  intermediate as described in the above text.   DISPOSITION:  The patient be treated medically.  Hopefully he will be  symptom-free.  He will need close outpatient follow-up.      Arturo Morton. Riley Kill, M.D. [facility]  Electronically Signed     TDS/MEDQ  D:  08/24/2004  T:  08/24/2004  Job:  [id number]   cc:   CV Lab   Scott A. Gerda Diss, MD  [address], [address]  [place]  [place] [address]  Fax: [phone]   Vida Roller, M.D.  Fax: [phone]   Kerin Perna, M.D.  [address]  [place]  [place] [address]
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
## 2010-05-29 NOTE — Op Note (Signed)
NAME:  Vincent Carter, Vincent Carter NO.:  [id number]   MEDICAL RECORD NO.:  [id number]          PATIENT TYPE:  INP   LOCATION:  2001                         FACILITY:  [facility]   PHYSICIAN:  Sheliah Plane, MD    DATE OF BIRTH:  1945-07-04   DATE OF PROCEDURE:  07/24/2004  DATE OF DISCHARGE:                                 OPERATIVE REPORT   PREOPERATIVE DIAGNOSIS:  Acute inferior and right ventricular infarct with 3-  vessel coronary artery disease.   POSTOPERATIVE DIAGNOSIS:  Acute inferior and right ventricular infarct with  3-vessel coronary artery disease.   SURGICAL PROCEDURE:  Redo coronary artery bypass grafting x4 with right  internal mammary as a pedicle graft to the left anterior descending coronary  artery, reverse saphenous vein graft to the intermediate coronary artery,  reverse saphenous vein graft to the obtuse marginal coronary artery, reverse  saphenous vein graft to the posterior descending coronary artery with right  leg vein endoharvesting.   SURGEON:  Sheliah Plane, MD.   FIRST ASSISTANT:  Coral Ceo, P.A.   BRIEF HISTORY:  The patient is a 65 year old male who previously had  undergone off pump coronary artery bypass grating in 1996 by Dr. Andrey Campanile.  The patient presented approximately 1 week prior to surgery with an acute  inferior right ventricular infarct complicated by V-fibrillation arrest in  [place].  He was transferred to [facility], stabilized on a  ventilator and intra-aortic balloon pump and underwent acute angioplasty of  the right coronary artery with multiple stents put into the right coronary  artery.  The vessel was reopened, but the patient still had residual  recurrent 3-vessel coronary artery disease to the left internal mammary  artery, which had been placed previously, was occluded.  After some period  of recovery, the patient was weaned from the ventilator and ultimately  extubated.  Respiratory status improved and  redo coronary artery bypass  grafting was recommended.   A preoperative echo did show evidence of inferior and right ventricular  infarcts with decreased function.  The patient agreed to the procedure and  signed a consent.   DESCRIPTION OF PROCEDURE:  With Swan-Ganz and arterial line monitors in  place, the patient underwent general endotracheal anesthesia without  incident.  The skin of the chest and legs was prepped with Betadine and  draped in the usual sterile manner.  Using the [employer] Endovein Harvesting  System, a vein was harvested from the right thigh and was of good quality  and caliber.  A mediastinotomy was performed with the sagittal saw the  underlying right ventricle was dissected off the posterior table of the  sternum.  The pericardium was opened.  The ascending aorta and the right  atrium were dissected out.  The previously placed mammary artery which was  occluded was identified.  The remainder of the heart was dissected free.  The ascending aorta was cannulated.  The right atrium was cannulated after  systemic catheterization.   The patient was placed on cardiopulmonary bypass at 2.4 L/min/sq m.  The  sites of anastomosis were dissected out. The patient's distal right  coronary  artery was severely diseased.  The proximal posterior descending coronary  artery was suitable for bypass. The posterolateral branch was extremely  small and was not suitable for bypass.  The patient's body temperature was  cooled to 30 degrees.  Aortic cross clamp was applied and 500 mL of cold  blood test cardioplegia was administered.   Attention was turned first to the obtuse marginal coronary artery which was  opened and using a segment of reverse saphenous vein graft distal  anastomosis was performed with a running 7-0 Prolene. The intermediate  coronary artery, in a similar fashion, was opened and admitted a 1.5 mm  probe.  It was partially intramyocardial.  Using a running 7-0  Prolene distal anastomosis was performed.  Additional  cold blood cardioplegia was administered down the vein grafts.   Attention was then turned to the posterior descending coronary artery which  was opened; and admitted a 1-mm probe distally.  Using a running 7-0 Prolene  distal anastomosis was performed using a segment of reverse saphenous vein  graft.  Each of the 3 vein grafts were then anastomosed to the ascending  aorta with a cross-clamp still in place.  The final proximal was left loose  to the area of the aorta.   The right internal mammary artery as a pedicle graft then was crossed at the  midline to the left anterior descending coronary artery where it was  anastomosed with a running 8-0 Prolene.  A bulldog was removed from the  mammary artery and there was appropriate rise in the myocardial septal  temperature.  The aortic cross-clamp was then removed.  __________ in the  heart through the proximal vein graft.  However, with the cross-clamp off  and the heart settled back to its position it was obvious that the mammary  artery was rotated and for this reason the aorta cross-clamp was replaced.  Additional antegrade cardioplegia was administered.  The bulldog was placed  back on the mammary and the anastomosis to the LAD taken down and redone  with a running 8-0 Prolene.  Again, the bulldog was removed from the mammary  artery with prompt rise in the myocardial septal temperature.  The aortic  cross-clamp was removed with additional cross-clamp time of 19 minutes.  The  patient spontaneously converted to a sinus rhythm.  Atrial and ventricular  pacing wires were applied  The patient was then ventilated and weaned of  cardiopulmonary bypass without difficulty.  He remained hemodynamically  stable.  He was decannulated in the usual fashion.  Protamine sulfate was  administered with operative field hemostatic.  Two atrial and 2 ventricular pacing wires and graft marks were  applied.  A right pleural tube and 2  mediastinal tubes were left in place.  The pericardium was loosely  reapproximated over the ascending aorta; and over the right ventricle.  The  sternum was closed in #6 stainless steel wire fashion; closed with  interrupted #0 Vicryl, running 3-0 Vicryl in the subcutaneous tissue, 4-0  subcuticular stitches in the skin edges; dry dressings were applied.  Sponge  and needle counts were reported as correct at the completion of the  procedure.  The patient tolerated the procedure without obvious  complications and was transferred to the surgical intensive care unit for  further postoperative care.   TOTAL PUMP TIME:  184 minutes.       EG/MEDQ  D:  07/28/2004  T:  07/28/2004  Job:  [id number]   cc:  Willa Rough, M.D.   [facility]  [place]  [place]

## 2010-05-29 NOTE — Cardiovascular Report (Signed)
NAME:  Vincent Carter, Vincent Carter NO.:  [id number]   MEDICAL RECORD NO.:  [id number]          PATIENT TYPE:  INP   LOCATION:  2926                         FACILITY:  [facility]   PHYSICIAN:  Salvadore Farber, M.D. LHCDATE OF BIRTH:  1945-10-30   DATE OF PROCEDURE:  07/17/2004  DATE OF DISCHARGE:                              CARDIAC CATHETERIZATION   PROCEDURES:  1.  Left heart catheterization.  2.  Left ventriculography.  3.  Coronary angiography.  4.  Bare metal stent x3 to the right coronary artery.  5.  Placement of intra-aortic balloon pump.  6.  [place] closure of the right common femoral arteriotomy site.   INDICATIONS:  Mr. Francisco is a 65 year old gentleman status post single-  vessel coronary artery bypass grafting with LIMA to the LAD approximately 10  years ago.  He presents tonight with acute inferior myocardial infarction.  Onset of pain was approximately 8 p.m.  He presented to [facility]  at 2044, where electrocardiogram demonstrated inferior ST elevations.  In  the emergency room he was treated with aspirin, heparin, nitroglycerin and  morphine.  While there had a VF arrest, which was promptly resuscitated.  It  was during this resuscitative effort he was placed on mechanical  ventilation.  He was transferred emergently and arrived here at 2200.  He  was intubated, sedated, and paralyzed.  Thus, no further history could be  obtained.  Monitor demonstrated persistent ST elevations in the anterior  leads.  He was unable to provide informed consent.  His family was all en  route to the hospital.  Given his ongoing ST elevations, we elected to  proceed with emergent cardiac catheterization and percutaneous  revascularization in the absence of informed consent.   PROCEDURAL TECHNIQUE:  Under 1% lidocaine local anesthesia, a 6 French  sheath was placed in the right common femoral artery and a 6 French sheath  in the right femoral vein using the  modified Seldinger technique.  Diagnostic angiography was performed using JL4 and JR4 catheters.  The JR4  catheter was also used to selectively engage the left subclavian.   Angiography:  This was performed by hand injection as well.  These images  demonstrated a long 99% stenosis of the proximal and mid-RCA as well as 70%  left main stenosis and atretic LIMA to LAD graft.  A decision was made to  proceed to percutaneous revascularization of the RCA as it bridged the  coronary artery bypass grafting.   The patient had received aspirin at [facility] emergency room.  Additional  heparin was given to achieve and maintain an ACT of greater than 200  seconds.  Double-bolus eptifibatide was administered.  A 6 [place] guide  was advanced over a wire and engaged in the ostium of the left main.  A Pro  Water wire was advanced to the distal RCA without difficulty.  With passage  of the wire, reperfusion was established resulting in TIMI-3 flow.  With  this, the patient became bradycardic and hypotensive.  Blood pressure fell  as far as 60 mmHg systolic.  He was treated  with atropine and dopamine.  An  intra-aortic balloon pump was placed via the left common femoral artery.  Counter pulsation was initiated at 1:1.  With these efforts, the patient's  blood pressure improved to greater than 100 systolic.   Attention was then returned to the RCA.  My initial intent was for balloon  angioplasty alone as a bridge to subsequent surgical revascularization.  Thus, a 3.0 x 20 mm Maverick was inflated to 8 atmospheres for two  inflations of approximately 30 seconds each.  With this, there was no  significant improvement in the stenosis.  The same balloon was reintroduced  and used for two successive inflations of a minute each.  After this, there  was a nonflow-limiting dissection in the midvessel.  It was clear that the  lesion could not be stabilized without stenting.  We therefore proceeded to   stenting.  A single stent would not cover the entirety of the lesion.  I  began by covering the distal portion of the lesion with a 3.0 x 28 mm Vision  deployed at 14 atmospheres.  The proximal portion of this stent was  overlapped using a 3.0 x 12 mm Vision also deployed at 14 atmospheres.  Repeat angiography demonstrated an edge dissection at the distal margin.  Just further downstream from this edge dissection was an additional area of  approximately 70% stenosis.  I elected to cover all this with a 3.0 x 28 mm  Vision.  The entirety of these stents were then post dilated using multiple  inflations of a 3.25 mm PowerSail, all at 18 atmospheres.  After this, the  midportion of the stent where the most severe portion of the original lesion  had been was not fully expanded.  A 3.5 x 20 mm Quantum was then introduced  and three successive inflations at 20 atmospheres performed, covering both  regions of overlap of the stent as well as the area that was not well-  expanded.  With these inflations, expansion was optimized.  Final  angiography demonstrated no residual stenosis in the stented segment, TIMI-3  flow to the distal vasculature with the exception of the most apical portion  of the PDA, which was embolized.  There was no residual dissection.  By the  completion of the procedure, the patient's dopamine had been weaned to off.  He did have episodes of transient atrial fibrillation toward the end of the  procedure, persisting after cessation of the dopamine.   COMPLICATIONS:  None.   FINDINGS:  1.  LV (performed after revascularization):  91/9/17.  EF 65% with mild      inferior hypokinesis.  2.  No aortic stenosis or mitral regurgitation.  3.  Left main:  A 70% distal stenosis extending into the origins of both the      LAD and circumflex.  4.  LAD:  A 70% ostial stenosis in continuity with the left main stenosis.     There is then 80% stenosis of the midvessel approximately 20 mm  from the      ostium.  The remainder of the vessel has diffuse but mild disease.      There is a large first diagonal branch which arises quite proximally.      This has a 90% ostial stenosis.  5.  Circumflex:  Moderate-sized vessel giving rise to one large branching      obtuse marginal.  There is an 80% ostial stenosis and a 70% stenosis  after the first branch.  6.  RCA:  Large, dominant vessel.  There is a 99% stenosis of the midvessel      stented and no residual.  There remains an 80% stenosis at the origin of      the posterior left ventricular branch, which is of moderate size.  The      most apical portion of the PDA was embolized.   IMPRESSION/RECOMMENDATIONS:  Successful revascularization of the culprit  lesion in the right coronary artery with total ischemic time of  approximately 2 hours 15 minutes.  Fortunately, myocardial infarction looks  good including the inferior wall.  He has residual three-vessel disease  involving the left main.  Will ask cardiac surgery to evaluate him for  consideration of redo coronary artery bypass grafting.   In light of planned surgery, will not administer Plavix.  Instead, will  continue eptifibatide until CABG.  Will administer aspirin, beta blocker and  statin.  Amiodarone will be continued at least for the next 12 hours.       WED/MEDQ  D:  07/18/2004  T:  07/18/2004  Job:  [id number]   cc:   Willa Rough, M.D.
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
## 2010-05-29 NOTE — Op Note (Signed)
NAME:  [name], BLEW NO.:  [id number]   MEDICAL RECORD NO.:  [id number]          PATIENT TYPE:  AMB   LOCATION:  DAY                           FACILITY:  [facility]   PHYSICIAN:  Lionel December, M.D.    DATE OF BIRTH:  September 15, 1945   DATE OF PROCEDURE:  08/06/2005  DATE OF DISCHARGE:                                 OPERATIVE REPORT   PROCEDURE:  Colonoscopy.   INDICATION:  [name] is a 65 year old Caucasian male who has been treated for  diverticulitis but continues to complain of intermittent LLQ abdominal pain.  He had abdominopelvic CT on July 13, 2005.  It  shows small sliding hiatal  hernia, sigmoid diverticulosis with mild proximal sigmoid colon wall  thickening, but no other changes to suggest diverticulitis.  He also had  small left inguinal hernia.  He is undergoing diagnostic colonoscopy.  Procedure and risks were reviewed with the patient, and informed consent was  obtained.   MEDICATIONS FOR CONSCIOUS SEDATION:  Demerol 50 mg IV, Versed 12 mg IV.   FINDINGS:  Procedure performed in endoscopy suite.  The patient's vital  signs and O2 saturation were monitored during the procedure and remained  stable.  The patient was placed in left lateral position and a rectal  examination performed.  No abnormality noted on external or digital exam.  Olympus videoscope was placed in rectum and advanced under vision into  sigmoid colon.  A spastic segment of sigmoid colon with few small  diverticula, but there was no exudate or stricture or mass.  He kept  complaining of discomfort as the scope was passed through this area, but he  settled down with more dose of conscious sedation.  Scope was passed into  cecum.  His preparation was felt to be satisfactory.  Cecum was identified  by appendiceal stump which appeared like a submucosal lesion or a lipoma.  The appendiceal stump which was prominent.  Mucosa, however, was normal.  Web was also seen and picture taken for the  record.  As the scope was  withdrawn, colonic mucosa was carefully examined and was otherwise normal.  The sigmoid colon mucosa was examined for the second time on the way out,  and there were no other abnormalities.  Rectal mucosa was normal.  Scope was  retroflexed to examine anorectal junction, and small hemorrhoids were noted  below the dentate line.  Endoscope was straightened and withdrawn.  The  patient tolerated the procedure well.   FINAL DIAGNOSES:  1.  Few scattered diverticula at sigmoid colon with spastic segments but no      evidence of diverticulitis, colitis, mass or stricture.  2.  Small external hemorrhoids.   RECOMMENDATIONS:  1.  He will resume his high-fiber diet and Metamucil as before.  2.  Dicyclomine 10 mg before breakfast and evening meal, prescription given      for 60 with 5 refills.  3.  He will return for OV in 4 weeks.  If he remains with LLQ abdominal      pain, follow-up CT would be considered.      [employer]  Karilyn Cota, M.D.  Electronically Signed     NR/MEDQ  D:  08/06/2005  T:  08/06/2005  Job:  [id number]   cc:   Phillips Odor, M.D.

## 2010-05-29 NOTE — Letter (Signed)
September 09, 2005     Patrica Duel, MD  [address]  [place], [place] [address]   RE:  Vincent Carter, Vincent Carter  MRN:  [id number]  /  DOB:  1945-10-15   Dear Vincent Carter:   Vincent Carter returns to the office for continued assessment and treatment of  coronary disease.  He is now more than one year following CABG surgery  complicated by a postoperative closure of the saphenous vein graft to his  ramus intermedius.  In recent months, he has been quite active with no chest  discomfort, nor dyspnea.  He discontinued cigarette smoking at the time of  his surgery.  Hypertension has been well controlled.  Lipids were quite good  off medication the last time they were checked.  His only current  medications are aspirin and metoprolol 25 mg b.i.d.   PHYSICAL EXAMINATION:  On exam, a pleasant trim gentleman in no acute  distress.  The weight is 152 pounds, 20 pounds more than postoperatively.  Blood pressure 115/75, heart rate 64 and regular.  Respirations 16.  Neck:  No jugular venous distention; normal carotid upstrokes without bruits.  Lungs:  Clear with prolonged expiratory phase.  Cardiac:  Normal first and  second heart sounds; fourth heart sound and minimal basalar systolic  ejection murmur.  Abdomen:  Aortic pulsation not palpable; no masses nor  organomegaly; minor hernia in the lower aspect of his immediate sternotomy  incision.  Extremities:  Minimal, if any, edema; distal pulses intact;  deformity of left lower leg at previous surgical site for repair of severe  fracture.   IMPRESSION:  Vincent Carter is doing beautifully.  We will assess lipids and a  chemistry profile.  Metoprolol will be renewed.  Pneumococcal vaccine will  be administered and influenza vaccine advised when available.  I will see  this nice gentleman again in one year.  He is congratulated on his excellent  control of risk factors to date.    Sincerely,      Vincent Friends. Dietrich Pates, MD, [facility]    RMR/MedQ  DD:  09/09/2005  DT:  09/10/2005  Job #:  [id number]
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
## 2010-12-18 ENCOUNTER — Encounter: Payer: Self-pay | Admitting: Cardiology

## 2010-12-31 ENCOUNTER — Ambulatory Visit (INDEPENDENT_AMBULATORY_CARE_PROVIDER_SITE_OTHER): Payer: Medicare Other | Admitting: Cardiology

## 2010-12-31 ENCOUNTER — Encounter: Payer: Self-pay | Admitting: Cardiology

## 2010-12-31 DIAGNOSIS — R946 Abnormal results of thyroid function studies: Secondary | ICD-10-CM

## 2010-12-31 DIAGNOSIS — Z87891 Personal history of nicotine dependence: Secondary | ICD-10-CM

## 2010-12-31 DIAGNOSIS — F17201 Nicotine dependence, unspecified, in remission: Secondary | ICD-10-CM | POA: Insufficient documentation

## 2010-12-31 DIAGNOSIS — K635 Polyp of colon: Secondary | ICD-10-CM

## 2010-12-31 DIAGNOSIS — R7989 Other specified abnormal findings of blood chemistry: Secondary | ICD-10-CM | POA: Insufficient documentation

## 2010-12-31 DIAGNOSIS — I739 Peripheral vascular disease, unspecified: Secondary | ICD-10-CM

## 2010-12-31 DIAGNOSIS — J449 Chronic obstructive pulmonary disease, unspecified: Secondary | ICD-10-CM

## 2010-12-31 DIAGNOSIS — I2510 Atherosclerotic heart disease of native coronary artery without angina pectoris: Secondary | ICD-10-CM | POA: Insufficient documentation

## 2010-12-31 DIAGNOSIS — I10 Essential (primary) hypertension: Secondary | ICD-10-CM

## 2010-12-31 MED ORDER — NITROGLYCERIN 0.6 MG/HR TD PT24: 1.0000 | MEDICATED_PATCH | TRANSDERMAL | Status: DC | PRN

## 2010-12-31 MED ORDER — METOPROLOL TARTRATE 50 MG PO TABS: 25.0000 mg | ORAL_TABLET | Freq: Two times a day (BID) | ORAL | Status: DC

## 2010-12-31 NOTE — Assessment & Plan Note (Addendum)
Decreased distal pulses, abnormal Doppler, and possible claudication.  Basic principles of peripheral vascular disease were discussed, most notably that intervention is generally reserved until the point that symptoms become troublesome for the patient.  At this point, it does not appear that further diagnostic testing or treatment is necessary.  Beta blocker will be discontinued in case this is causing vasospasm and thus contributing to his symptoms.

## 2010-12-31 NOTE — Assessment & Plan Note (Addendum)
Patient is doing very well from the standpoint of coronary disease with no symptoms to suggest myocardial ischemia.  We will continue to attempt to optimally manage cardiovascular risk factors.  Total cholesterol has been less than 100 in the past, and patient previously developed a skin eruption while being treated with a statin.  We will continue to rely upon dietary rather than pharmacologic therapy for control of lipids.

## 2010-12-31 NOTE — Assessment & Plan Note (Addendum)
Patient denies a history of hypertension, and blood pressure measurements have never been elevated in the office.  Since metoprolol could be exacerbating peripheral vascular disease, that medication will be discontinued.  Patient will monitor blood pressures to determine if there is a significant increase off antihypertensive medication.  A return blood pressure check will be scheduled in one month.

## 2010-12-31 NOTE — Patient Instructions (Signed)
Your physician recommends that you schedule a follow-up appointment in: 1 month for Blood pressure check and 12 month with Dr Dietrich Pates  Your physician has recommended you make the following change in your medication:  STOP Nitroglycerin STOP Metoprolol  Your physician has requested that you regularly monitor and record your blood pressure readings at home. Please use the same machine at the same time of day to check your readings and record them to bring to your follow-up visit.  STOP Smoking

## 2010-12-31 NOTE — Assessment & Plan Note (Signed)
The importance of completely discontinuing tobacco use, especially with respect to peripheral vascular disease, was discussed.  Patient is advised to attempt to quit, seek over-the-counter nicotine replacement if necessary and call the office if unsuccessful for further advice.

## 2010-12-31 NOTE — Progress Notes (Signed)
Patient ID: Vincent Carter, male   DOB: April 05, 1945, 65 y.o.   MRN: [id number] HPI: Return visit for this very pleasant gentleman who is now 5 years post CABG surgery.  In that interval, he has been asymptomatic from a cardiovascular standpoint with good control of risk factors except for tobacco use.  Currently, he is smoking approximately 1/3 pack of cigarettes per day.  He had respiratory symptoms last week which attributes to an upper respiratory infection or influenza.  He is now markedly improved.  He also notes symptoms that are consistent with claudication with discomfort either in the hips or the calves occurring with exercise and relieved by rest.  The level of exercise required to elicit symptoms is quite variable from day to day.  Prior to Admission medications   Medication Sig Start Date End Date Taking? Authorizing Provider  aspirin 81 MG tablet Take 81 mg by mouth 2 (two) times daily.     Yes Historical Provider, MD  metoprolol (LOPRESSOR) 50 MG tablet Take 0.5 tablets (25 mg total) by mouth 2 (two) times daily. 12/31/10  Yes Gerrit Friends. Chanika Byland, MD  nitroGLYCERIN (NITRODUR - DOSED IN MG/24 HR) 0.6 mg/hr Place 1 patch (0.6 mg total) onto the skin as needed. 12/31/10  Yes Gerrit Friends. Dietrich Pates, MD  Omega-3 Fatty Acids (FISH OIL) 300 MG CAPS Take 2 capsules by mouth 2 (two) times daily.     Yes Historical Provider, MD  No Known Allergies    Past medical history, social history, and family history reviewed and updated.  ROS: Denies chest discomfort, orthopnea, PND, pedal edema, or known elevation of blood pressure.  No chronic cough, sputum production, but he does note class II dyspnea on exertion.  PHYSICAL EXAM: BP 134/74  Pulse 61  Wt 68.04 kg (150 lb)  General-Well developed; no acute distress Neck-No JVD; no carotid bruits Lungs-Minor considering expiratory rhonchi with prolonged expiratory phase; resonant to percussion Cardiovascular-normal PMI; normal S1 and S2; S4  present Abdomen-normal bowel sounds; soft and non-tender without masses or organomegaly Musculoskeletal-No deformities, no cyanosis or clubbing Neurologic-Normal cranial nerves; symmetric strength and tone Skin-Warm, no significant lesions Extremities-Decreased pulses, generally 1+.  Wave forms are monophasic by Doppler; no edema  ASSESSMENT AND PLAN:  Chesterton Bing, MD 12/31/2010 11:57 AM

## 2011-01-08 ENCOUNTER — Telehealth: Payer: Self-pay | Admitting: Cardiology

## 2011-01-08 NOTE — Telephone Encounter (Signed)
PT HAS BEEN TAKEN OFF OF METOPROLOL AND STATES THAT HIM BP HAS BEEN RUNNING HIGH HAD EMS COME LAST NIGHT AND CHECKED BP WAS 174/100. WITH HEADACE. HE STATES IT IS NOT AS HIGH THIS AM.  WANTS TO KNOW WHAT TO DO/TMJ

## 2011-01-13 ENCOUNTER — Telehealth: Payer: Self-pay | Admitting: *Deleted

## 2011-01-13 NOTE — Telephone Encounter (Signed)
Spoke with patient and advised him to see Samara Deist @ 220 pm tomorrow to address BP issues.  States that he is feeling better, however BP are still not ideal.

## 2011-01-14 ENCOUNTER — Encounter: Payer: Self-pay | Admitting: Adult Health

## 2011-01-14 ENCOUNTER — Ambulatory Visit (INDEPENDENT_AMBULATORY_CARE_PROVIDER_SITE_OTHER): Payer: Medicare Other | Admitting: Adult Health

## 2011-01-14 ENCOUNTER — Other Ambulatory Visit: Payer: Self-pay | Admitting: Adult Health

## 2011-01-14 ENCOUNTER — Other Ambulatory Visit: Payer: Self-pay | Admitting: Cardiology

## 2011-01-14 VITALS — BP 158/92 | HR 78 | Ht 62.0 in | Wt 149.0 lb

## 2011-01-14 DIAGNOSIS — I739 Peripheral vascular disease, unspecified: Secondary | ICD-10-CM

## 2011-01-14 DIAGNOSIS — I10 Essential (primary) hypertension: Secondary | ICD-10-CM

## 2011-01-14 DIAGNOSIS — I2510 Atherosclerotic heart disease of native coronary artery without angina pectoris: Secondary | ICD-10-CM

## 2011-01-14 MED ORDER — NITROGLYCERIN 0.4 MG SL SUBL: 0.4000 mg | SUBLINGUAL_TABLET | SUBLINGUAL | Status: DC | PRN

## 2011-01-14 MED ORDER — AMLODIPINE BESYLATE 5 MG PO TABS: 5.0000 mg | ORAL_TABLET | Freq: Every day | ORAL | Status: DC

## 2011-01-14 NOTE — Assessment & Plan Note (Signed)
I will begin amlodipine 5 mg, dihydropyridine CCB to assist with BP and with possible vasospastic PVD. I would like to see BP in 130's per [facility] guidelines for patients with CAD. He is to buy a BP cuff and take BP daily. He is to record BP readings and bring to follow-up appointment.

## 2011-01-14 NOTE — Patient Instructions (Signed)
Your physician recommends that you schedule a follow-up appointment in: 1 month  Your physician has requested that you regularly monitor and record your blood pressure readings at home. Please use the same machine at the same time of day to check your readings and record them to bring to your follow-up visit.  Bilateral ABIs  Your physician has recommended you make the following change in your medication:  1 - RESTART Nitroglycerin as needed 2 - START Amlodipine 5 mg daily

## 2011-01-14 NOTE — Progress Notes (Signed)
HPI: Mr. Sedor is a very pleasant 66 y/o patient of Dr. Dietrich Pates we are following for ongoing assessment and treatment of CAD, s/p CABG in 2007, and most recently intermittent claudication symptoms. He was last seen by Dr. Dietrich Pates on 12/31/10 and was found to have abnormal doppler exam of LE with diminished pulses bilaterally, worse on the left. At that time, he was taken off of metoprolol with the thought that he may be having vasospam in the lower vascular system causing the diminished pulses. He comes today for follow-up. He states that he really doesn't feel a lot different concerning his symptoms, but may feel a little better concerning tightness in the calves. He states that the tightness in the calves comes and goes. He usually has some pain in his left heel and great toe, but this has gotten a little better. Otherwise he is concerned that his BP has gotten too high off the metoprolol.    No Known Allergies  Current Outpatient Prescriptions  Medication Sig Dispense Refill  . aspirin 81 MG tablet Take 81 mg by mouth 2 (two) times daily.        . Omega-3 Fatty Acids (FISH OIL) 300 MG CAPS Take 2 capsules by mouth 2 (two) times daily.          Past Medical History  Diagnosis Date  . ASCVD (arteriosclerotic cardiovascular disease)     Coronary artery bypass graft surgery in 1997 with LIMA to the left anterior descending;redo surgery in 07/2004 for three-vessel disease; cath in 8/06-ejection fraction of 40-45% withanteroapical hypokinesis; occlusion of the saphenous vein graft to the RI; patent RIMA to left anteriordescending; unsuccessful PCI in totally occluded RI graft.  . Tobacco abuse     40 pack years discontinued in 2006; resumed 2009  . Chronic obstructive pulmonary disease   . Hypertension   . Ptosis     Left, secondary to injury  . Low TSH level     Clinically euthyroid  . Colonic polyp 2011    Colonoscopy + polypectomy  . Diverticulosis     History of diverticulitis  .  Peripheral vascular disease     Decreased distal pulses and abnormal Doppler    Past Surgical History  Procedure Date  . Appendectomy   . Orif tibia & fibula fractures     Right  . Eye surgery     Left; secondary to injury  . Coronary artery bypass graft 8469,6295  . Colonoscopy w/ polypectomy 2011    MWU:XLKGMW of systems complete and found to be negative unless listed above PHYSICAL EXAM BP 158/92  Pulse 78  Ht 5\' 2"  (1.575 m)  Wt 149 lb (67.586 kg)  BMI 27.25 kg/m2  General: Well developed, well nourished, in no acute distress Head: Eyes PERRLA, No xanthomas.   Normal cephalic and atramatic  Lungs: Clear bilaterally to auscultation and percussion. Heart: HRRR S1 S2, without MRG.  Pulses are 2+ & equal.            No carotid bruit. No JVD.  No abdominal bruits. No femoral bruits. Abdomen: Bowel sounds are positive, abdomen soft and non-tender without masses or                  Hernia's noted. Msk:  Back normal, normal gait. Normal strength and tone for age. Extremities: No clubbing, cyanosis or edema.  DP +1. Doppler pulses reveal a strong pulse in the right DP, and PT. Diminished doppler pulse in the left PT and  DP. Able to palpate the right pulse, but not the left pulse. He has had reconstruction surgery of the left leg with marked deformity of the medial lateral calf.  Neuro: Alert and oriented X 3. Psych:  Good affect, responds appropriately    ASSESSMENT AND PLAN

## 2011-01-14 NOTE — Assessment & Plan Note (Signed)
He continues to have decreased pulses on the left, but markedly improved on the right.  He cannot really tell a difference in his symptoms. He states that it may be a little better but he is not sure. He continues to have tightness in his calves at times. I will check ABI;s for thoroughness. He has had extensive surgery on the left leg and diminished pulse may be related to this, with scar tissue and possible injury to arterial vessels. He will follow-up with Dr. Dietrich Pates when tests are completed.

## 2011-01-15 ENCOUNTER — Ambulatory Visit (HOSPITAL_COMMUNITY)
Admission: RE | Admit: 2011-01-15 | Discharge: 2011-01-15 | Disposition: A | Discharge status: Home / Self Care | Payer: Medicare Other | Source: Ambulatory Visit | Attending: Cardiology | Admitting: Cardiology

## 2011-01-15 DIAGNOSIS — I2510 Atherosclerotic heart disease of native coronary artery without angina pectoris: Secondary | ICD-10-CM

## 2011-01-15 DIAGNOSIS — M79609 Pain in unspecified limb: Secondary | ICD-10-CM | POA: Insufficient documentation

## 2011-01-15 DIAGNOSIS — I10 Essential (primary) hypertension: Secondary | ICD-10-CM | POA: Insufficient documentation

## 2011-02-02 ENCOUNTER — Ambulatory Visit (INDEPENDENT_AMBULATORY_CARE_PROVIDER_SITE_OTHER): Payer: Medicare Other

## 2011-02-02 VITALS — BP 115/72 | HR 73 | Ht 62.0 in | Wt 148.0 lb

## 2011-02-02 DIAGNOSIS — I10 Essential (primary) hypertension: Secondary | ICD-10-CM

## 2011-02-02 NOTE — Progress Notes (Signed)
**Note De-Identified Vincent Carter Obfuscation** Pt. arrives in office for a 1 month BP check. On last OV with Joni Reining, NP on 01-14-11 pt. was advised to have ABI's, start taking Amlodipine 5 mg daily and restart NTG as needed for CP. Pt. has no complaints at this time. His BP today is 115/72 and on last OV his BP was 158/92. Pt. had ABI's,  results were mild left-sided obstructive disease; not severe enough to warrant intervention per Dr. Dietrich Pates. He brought his BP dairy with him to BP check and readings are similar to today's reading.  Pt. advised to continue current medical treatment and that we will contact him with Joni Reining, Np's recommendations.

## 2011-02-05 ENCOUNTER — Other Ambulatory Visit: Payer: Self-pay | Admitting: *Deleted

## 2011-02-15 ENCOUNTER — Ambulatory Visit: Payer: Medicare Other | Admitting: Cardiology

## 2011-03-09 ENCOUNTER — Encounter: Payer: Self-pay | Admitting: Cardiology

## 2011-03-09 ENCOUNTER — Ambulatory Visit (INDEPENDENT_AMBULATORY_CARE_PROVIDER_SITE_OTHER): Payer: Medicare Other | Admitting: Cardiology

## 2011-03-09 DIAGNOSIS — F17200 Nicotine dependence, unspecified, uncomplicated: Secondary | ICD-10-CM

## 2011-03-09 DIAGNOSIS — I739 Peripheral vascular disease, unspecified: Secondary | ICD-10-CM

## 2011-03-09 DIAGNOSIS — I2510 Atherosclerotic heart disease of native coronary artery without angina pectoris: Secondary | ICD-10-CM

## 2011-03-09 DIAGNOSIS — Z720 Tobacco use: Secondary | ICD-10-CM

## 2011-03-09 DIAGNOSIS — I10 Essential (primary) hypertension: Secondary | ICD-10-CM

## 2011-03-09 NOTE — Assessment & Plan Note (Signed)
Importance of discontinuing cigarette smoking discussed at length with patient.

## 2011-03-09 NOTE — Assessment & Plan Note (Signed)
Blood pressure control is good with current regimen, which will be continued.

## 2011-03-09 NOTE — Progress Notes (Signed)
Patient ID: Vincent Carter, male   DOB: 11/30/1945, 66 y.o.   MRN: [id number] HPI: Scheduled return visit for continued assessment and treatment of coronary disease, peripheral vascular disease and cardiovascular risk factors.  Since his last evaluation, Vincent Carter has done quite well.  He has experienced no chest discomfort nor dyspnea despite maintaining a reasonable level of activity.  He monitors blood pressure at home with diastolics well below 90 and systolics typically in the 130s.  He experiences some claudication, but is generally satisfied with his exercise capacity.  Unfortunately, he has not attempted to quit use of tobacco products.  He did accomplish this task in the past for a 3 year intervals without the use of any pharmacologic assistance.  Prior to Admission medications   Medication Sig Start Date End Date Taking? Authorizing Provider  amLODipine (NORVASC) 5 MG tablet Take 1 tablet (5 mg total) by mouth daily. 01/14/11 01/14/12 Yes Joni Reining, NP  aspirin 81 MG tablet Take 81 mg by mouth 2 (two) times daily.     Yes Historical Provider, MD  nitroGLYCERIN (NITROSTAT) 0.4 MG SL tablet Place 1 tablet (0.4 mg total) under the tongue every 5 (five) minutes as needed for chest pain. 01/14/11 01/14/12 Yes Joni Reining, NP  Omega-3 Fatty Acids (FISH OIL) 300 MG CAPS Take 2 capsules by mouth 2 (two) times daily.     Yes Historical Provider, MD  No Known Allergies    Past medical history, social history, and family history reviewed and updated.  ROS: Denies orthopnea, PND, resting dyspnea, palpitations, lightheadedness or syncope.  PHYSICAL EXAM: BP 142/88  Pulse 62  Ht 5\' 2"  (1.575 m)  Wt 68.584 kg (151 lb 3.2 oz)  BMI 27.65 kg/m2  General-Well developed; no acute distress Body habitus-proportionate weight and height Neck-No JVD; no carotid bruits Lungs-Mild inspiratory and expiratory rhonchi; prolonged expiratory phase; resonant to percussion Cardiovascular-normal PMI;  normal S1 and S2; S4 present; minimal systolic murmur Abdomen-normal bowel sounds; soft and non-tender without masses or organomegaly Musculoskeletal-No deformities, no cyanosis or clubbing Neurologic-Normal cranial nerves; symmetric strength and tone Skin-Warm, no significant lesions Extremities-Decreased distal pulses except right dorsalis pedis, which is 2+; no edema  ABIs:  Right-1.09, left-0.76   ASSESSMENT AND PLAN:  [place] Bing, MD 03/09/2011 5:22 PM

## 2011-03-09 NOTE — Assessment & Plan Note (Signed)
Peripheral vascular disease is fairly mild without perfect correlation between disease and symptoms.  No pharmacologic treatment or intervention will be undertaken at present.

## 2011-03-09 NOTE — Patient Instructions (Signed)
Your physician recommends that you schedule a follow-up appointment in: 12 months  Stop Smoking and call us if you are unable to without medication assistance.

## 2011-03-09 NOTE — Assessment & Plan Note (Signed)
Patient continues to do well with respect to coronary disease with no symptoms and generally good control of cardiovascular risk factors except tobacco use.  The importance of stopping smoking was once again discussed with him at length.  He agrees in principle to try, but does not appear to be very committed.

## 2012-02-11 ENCOUNTER — Other Ambulatory Visit: Payer: Self-pay | Admitting: Adult Health

## 2012-02-21 ENCOUNTER — Ambulatory Visit (HOSPITAL_COMMUNITY)
Admission: RE | Admit: 2012-02-21 | Discharge: 2012-02-21 | Disposition: A | Discharge status: Home / Self Care | Payer: Medicare Other | Source: Ambulatory Visit | Attending: Family Medicine | Admitting: Family Medicine

## 2012-02-21 ENCOUNTER — Other Ambulatory Visit (HOSPITAL_COMMUNITY): Payer: Self-pay | Admitting: Family Medicine

## 2012-02-21 DIAGNOSIS — R05 Cough: Secondary | ICD-10-CM | POA: Insufficient documentation

## 2012-02-21 DIAGNOSIS — J209 Acute bronchitis, unspecified: Secondary | ICD-10-CM

## 2012-02-21 DIAGNOSIS — R059 Cough, unspecified: Secondary | ICD-10-CM | POA: Insufficient documentation

## 2012-02-21 DIAGNOSIS — Z951 Presence of aortocoronary bypass graft: Secondary | ICD-10-CM | POA: Insufficient documentation

## 2012-02-21 DIAGNOSIS — R509 Fever, unspecified: Secondary | ICD-10-CM | POA: Insufficient documentation

## 2012-03-08 ENCOUNTER — Encounter: Payer: Self-pay | Admitting: Cardiology

## 2012-03-08 ENCOUNTER — Ambulatory Visit (INDEPENDENT_AMBULATORY_CARE_PROVIDER_SITE_OTHER): Payer: Medicare Other | Admitting: Cardiology

## 2012-03-08 VITALS — BP 130/82 | HR 76 | Ht 62.0 in | Wt 152.0 lb

## 2012-03-08 DIAGNOSIS — F17200 Nicotine dependence, unspecified, uncomplicated: Secondary | ICD-10-CM

## 2012-03-08 DIAGNOSIS — I7090 Unspecified atherosclerosis: Secondary | ICD-10-CM

## 2012-03-08 DIAGNOSIS — R7989 Other specified abnormal findings of blood chemistry: Secondary | ICD-10-CM

## 2012-03-08 DIAGNOSIS — J449 Chronic obstructive pulmonary disease, unspecified: Secondary | ICD-10-CM

## 2012-03-08 DIAGNOSIS — I2510 Atherosclerotic heart disease of native coronary artery without angina pectoris: Secondary | ICD-10-CM

## 2012-03-08 MED ORDER — PRAVASTATIN SODIUM 10 MG PO TABS: 10.0000 mg | ORAL_TABLET | Freq: Every day | ORAL | Status: DC

## 2012-03-08 NOTE — Assessment & Plan Note (Signed)
40 pack years discontinued in 2006; resumed 2009; discontinued 01/2012

## 2012-03-08 NOTE — Assessment & Plan Note (Signed)
Blood pressure control is excellent with current medication, which will be continued. 

## 2012-03-08 NOTE — Progress Notes (Deleted)
Name: Vincent Carter    DOB: 1945-06-17  Age: 67 y.o.  MR#: [id number]       PCP:  Cassell Smiles., MD      Insurance: Payor: Cleatrice Burke MEDICARE  Plan: [employer] MEDICARE COMPLETE  Product Type: *No Product type*    CC:   No chief complaint on file.  MEDICATION LIST PCP LABS ON 02/21/12  VS Filed Vitals:   03/08/12 1325  BP: 130/82  Pulse: 76  Height: 5\' 2"  (1.575 m)  Weight: 152 lb (68.947 kg)  SpO2: 97%    Weights Current Weight  03/08/12 152 lb (68.947 kg)  03/09/11 151 lb 3.2 oz (68.584 kg)  02/02/11 148 lb (67.132 kg)    Blood Pressure  BP Readings from Last 3 Encounters:  03/08/12 130/82  03/09/11 142/88  02/02/11 115/72     Admit date:  (Not on file) Last encounter with RMR:  Visit date not found   Allergy Review of patient's allergies indicates no known allergies.  Current Outpatient Prescriptions  Medication Sig Dispense Refill  . amLODipine (NORVASC) 5 MG tablet TAKE ONE TABLET BY MOUTH DAILY.  30 tablet  6  . aspirin 81 MG tablet Take 81 mg by mouth 2 (two) times daily.        . nitroGLYCERIN (NITROSTAT) 0.4 MG SL tablet Place 1 tablet (0.4 mg total) under the tongue every 5 (five) minutes as needed for chest pain.  100 tablet  3  . Omega-3 Fatty Acids (FISH OIL) 300 MG CAPS Take 2 capsules by mouth 2 (two) times daily.         No current facility-administered medications for this visit.    Discontinued Meds:   There are no discontinued medications.  Patient Active Problem List  Diagnosis  . Hypertension  . Chronic obstructive pulmonary disease  . Colonic polyp  . ASCVD (arteriosclerotic cardiovascular disease)  . Tobacco abuse  . Low TSH level  . Peripheral vascular disease    LABS    Component Value Date/Time   NA 142 10/31/2009   NA 140 11/04/2008 2101   NA 140 11/04/2008   K 4.5 10/31/2009   K 4.4 11/04/2008 2101   K 4.4 11/04/2008   CL 109 10/31/2009   CL 106 11/04/2008 2101   CL 106 11/04/2008   CO2 23 10/31/2009   CO2  23 11/04/2008 2101   CO2 23 11/04/2008   GLUCOSE 104 10/31/2009   GLUCOSE 97 11/04/2008 2101   GLUCOSE 97 11/04/2008   BUN 15 10/31/2009   BUN 17 11/04/2008 2101   BUN 17 11/04/2008   CREATININE 1.05 10/31/2009   CREATININE 0.99 11/04/2008 2101   CREATININE 0.99 11/04/2008   CALCIUM 9.1 10/31/2009   CALCIUM 9.4 11/04/2008 2101   CALCIUM 9.4 11/04/2008   GFRNONAA >60 10/31/2009   CMP     Component Value Date/Time   NA 142 10/31/2009   K 4.5 10/31/2009   CL 109 10/31/2009   CO2 23 10/31/2009   GLUCOSE 104 10/31/2009   BUN 15 10/31/2009   CREATININE 1.05 10/31/2009   CALCIUM 9.1 10/31/2009   PROT 5.8 10/31/2009   ALBUMIN 3.9 10/31/2009   AST 8 10/31/2009   ALT 13 10/31/2009   ALKPHOS 62 10/31/2009   BILITOT 1.0 11/04/2008 2101   GFRNONAA >60 10/31/2009       Component Value Date/Time   WBC 6.7 10/31/2009   HGB 16.3 10/31/2009   HCT 47.3 10/31/2009   MCV 82.3 10/31/2009    Lipid  Panel     Component Value Date/Time   CHOL 95 10/31/2009   TRIG 59 10/31/2009   HDL 32 10/31/2009   CHOLHDL 2.4 Ratio 11/04/2008 2101   VLDL 16 11/04/2008 2101   LDLCALC 51 10/31/2009    ABG No results found for this basename: phart, pco2, pco2art, po2, po2art, hco3, tco2, acidbasedef, o2sat     Lab Results  Component Value Date   TSH 0.874 10/31/2009   BNP (last 3 results) No results found for this basename: PROBNP,  in the last 8760 hours Cardiac Panel (last 3 results) No results found for this basename: CKTOTAL, CKMB, TROPONINI, RELINDX,  in the last 72 hours  Iron/TIBC/Ferritin No results found for this basename: iron, tibc, ferritin     EKG Orders placed in visit on 01/14/11  . EKG 12-LEAD     Prior Assessment and Plan Problem List as of 03/08/2012     ICD-9-CM     Cardiology Problems   Hypertension   Last Assessment & Plan   03/09/2011 Office Visit Written 03/09/2011  5:31 PM by Kathlen Brunswick, MD     Blood pressure control is good with current regimen,  which will be continued.    ASCVD (arteriosclerotic cardiovascular disease)   Last Assessment & Plan   03/09/2011 Office Visit Written 03/09/2011  5:31 PM by Kathlen Brunswick, MD     Patient continues to do well with respect to coronary disease with no symptoms and generally good control of cardiovascular risk factors except tobacco use.  The importance of stopping smoking was once again discussed with him at length.  He agrees in principle to try, but does not appear to be very committed.    Peripheral vascular disease   Last Assessment & Plan   03/09/2011 Office Visit Written 03/09/2011  5:32 PM by Kathlen Brunswick, MD     Peripheral vascular disease is fairly mild without perfect correlation between disease and symptoms.  No pharmacologic treatment or intervention will be undertaken at present.      Other   Chronic obstructive pulmonary disease   Colonic polyp   Tobacco abuse   Last Assessment & Plan   03/09/2011 Office Visit Written 03/09/2011  5:32 PM by Kathlen Brunswick, MD     Importance of discontinuing cigarette smoking discussed at length with patient.    Low TSH level       Imaging: Dg Chest 2 View  02/21/2012  *RADIOLOGY REPORT*  Clinical Data: Cough, congestion, fever.  CHEST - 2 VIEW  Comparison: 10/30/2009  Findings: Lateral film degraded by motion.  The lungs are clear without focal infiltrate, edema, pneumothorax or pleural effusion. Interstitial markings are diffusely coarsened with chronic features. The patient is status post CABG. Imaged bony structures of the thorax are intact.  IMPRESSION: Cardiomegaly with chronic interstitial coarsening.  Stable exam without new or acute interval findings.   Original Report Authenticated By: Kennith Center, M.D.

## 2012-03-08 NOTE — Assessment & Plan Note (Signed)
Currently asymptomatic. Patient congratulated on discontinuing tobacco use and encouraged to continue to abstain.

## 2012-03-08 NOTE — Progress Notes (Signed)
Patient ID: Vincent Carter, male   DOB: 12/20/1945, 67 y.o.   MRN: [id number]  HPI: Schedule return visit for this nice gentleman with hypertension and coronary artery disease. Since his last visit, he has done extremely well. 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 He has not previously been told of pulse irregularity. He discontinued cigarette smoking 6 weeks ago.  Current Outpatient Prescriptions  Medication Sig Dispense Refill  . amLODipine (NORVASC) 5 MG tablet TAKE ONE TABLET BY MOUTH DAILY.  30 tablet  6  . aspirin 81 MG tablet Take 81 mg by mouth 2 (two) times daily.        . nitroGLYCERIN (NITROSTAT) 0.4 MG SL tablet Place 1 tablet (0.4 mg total) under the tongue every 5 (five) minutes as needed for chest pain.  100 tablet  3  . Omega-3 Fatty Acids (FISH OIL) 300 MG CAPS Take 2 capsules by mouth 2 (two) times daily.        . pravastatin (PRAVACHOL) 10 MG tablet Take 1 tablet (10 mg total) by mouth daily.  30 tablet  11   No current facility-administered medications for this visit.   No Known Allergies    Past medical history, social history, and family history reviewed and updated.  ROS: Denies chest pain, dyspnea, lightheadedness, palpitations or syncope. All other systems reviewed and are negative.  PHYSICAL EXAM: BP 130/82  Pulse 76  Ht 5\' 2"  (1.575 m)  Wt 68.947 kg (152 lb)  BMI 27.79 kg/m2  SpO2 97%;  Body mass index is 27.79 kg/(m^2). General-Well developed; no acute distress Body habitus-mildly overweight Neck-No JVD; no carotid bruits Lungs-clear lung fields; resonant to percussion Cardiovascular-normal PMI; normal S1 and S2; S4 present; irregular rhythm Abdomen-normal bowel sounds; soft and non-tender without masses or organomegaly Musculoskeletal-No deformities, no cyanosis or clubbing Neurologic-Normal cranial nerves; symmetric strength and tone Skin-Warm, no significant  lesions Extremities-distal pulses-1+ on left, 1+ right dorsalis pedis, 2+ right posterior tibial;  trace edema  Rhythm Strip: Normal sinus rhythm; frequent monomorphic PVCs  [place] Bing, MD 03/08/2012  2:30 PM  ASSESSMENT AND PLAN

## 2012-03-08 NOTE — Patient Instructions (Addendum)
Your physician recommends that you schedule a follow-up appointment in: 1 year  Your physician has recommended you make the following change in your medication:  1 - START Pravastatin 10 mg daily- if you have adverse symptoms, may decrease to 5 mg daily

## 2012-03-08 NOTE — Assessment & Plan Note (Signed)
Patient has done well since his redo CABG surgery almost 8 years ago. We will continue to attempt to optimally manage cardiovascular risk factors. Discontinuation of smoking is a very positive development in this regard.

## 2012-09-12 ENCOUNTER — Other Ambulatory Visit: Payer: Self-pay

## 2012-09-12 MED ORDER — AMLODIPINE BESYLATE 5 MG PO TABS: ORAL_TABLET | ORAL | Status: DC

## 2013-02-21 ENCOUNTER — Ambulatory Visit (INDEPENDENT_AMBULATORY_CARE_PROVIDER_SITE_OTHER): Payer: Medicare Other | Admitting: Cardiology

## 2013-02-21 ENCOUNTER — Encounter: Payer: Self-pay | Admitting: Cardiology

## 2013-02-21 VITALS — BP 143/72 | HR 69 | Ht 62.0 in | Wt 150.0 lb

## 2013-02-21 DIAGNOSIS — I10 Essential (primary) hypertension: Secondary | ICD-10-CM

## 2013-02-21 DIAGNOSIS — E039 Hypothyroidism, unspecified: Secondary | ICD-10-CM

## 2013-02-21 DIAGNOSIS — E782 Mixed hyperlipidemia: Secondary | ICD-10-CM

## 2013-02-21 DIAGNOSIS — I2510 Atherosclerotic heart disease of native coronary artery without angina pectoris: Secondary | ICD-10-CM

## 2013-02-21 MED ORDER — NITROGLYCERIN 0.4 MG SL SUBL: 0.4000 mg | SUBLINGUAL_TABLET | SUBLINGUAL | Status: DC | PRN

## 2013-02-21 NOTE — Patient Instructions (Addendum)
Your physician recommends that you schedule a follow-up appointment in: [place] physician recommends that you return for lab work in: [place] ( [place], FREE T4)  Your physician has requested that you have an echocardiogram. Echocardiography is a painless test that uses sound waves to create images of your heart. It provides your doctor with information about the size and shape of your heart and how well your heart's chambers and valves are working. This procedure takes approximately one hour. There are no restrictions for this procedure.  WE WILL CALL YOU WITH YOUR TEST RESULTS/INSTRUCTIONS/NEXT STEPS ONCE RECEIVED BY THE PROVIDER

## 2013-02-21 NOTE — Progress Notes (Signed)
Clinical Summary Vincent Carter is a 68 y.o.male former patient of Dr [name], this is our first visit together. He is seen for the following medical problems.  1. CAD - CABG in 1997, redo in 2006 as described belos.  - ejection fraction in 2006 reported at 40-45%, do not see more recent study.   - denies any chest pain, no SOB or DOE. No orthopnea, no PND, no LE edema - compliant with meds. Statins cause headaches and fatigue  2. HTN - checks at home occasionally, typically around 120/60 first thing in AM.  - compliant with meds  Past Medical History  Diagnosis Date  . ASCVD (arteriosclerotic cardiovascular disease)     Coronary artery bypass graft surgery in 1997 with LIMA to the left anterior descending;redo surgery in 07/2004 for three-vessel disease; cath in 8/06-ejection fraction of 40-45% withanteroapical hypokinesis; occlusion of the saphenous vein graft to the RI; patent RIMA to left anteriordescending; unsuccessful PCI in totally occluded RI graft.  . Tobacco abuse, in remission     40 pack years discontinued in 2006; resumed 2009; discontinued again in 01/2012  . Chronic obstructive pulmonary disease   . Hypertension   . Ptosis     Left, secondary to injury  . Low TSH level     Clinically euthyroid  . Colonic polyp 2011    Colonoscopy + polypectomy  . Diverticulosis     History of diverticulitis  . Peripheral vascular disease     Decreased distal pulses and abnormal Doppler     No Known Allergies   Current Outpatient Prescriptions  Medication Sig Dispense Refill  . amLODipine (NORVASC) 5 MG tablet TAKE ONE TABLET BY MOUTH DAILY.  30 tablet  5  . aspirin 81 MG tablet Take 81 mg by mouth 2 (two) times daily.        . nitroGLYCERIN (NITROSTAT) 0.4 MG SL tablet Place 1 tablet (0.4 mg total) under the tongue every 5 (five) minutes as needed for chest pain.  100 tablet  3  . Omega-3 Fatty Acids (FISH OIL) 300 MG CAPS Take 2 capsules by mouth 2 (two) times daily.         . pravastatin (PRAVACHOL) 10 MG tablet Take 1 tablet (10 mg total) by mouth daily.  30 tablet  11   No current facility-administered medications for this visit.     Past Surgical History  Procedure Laterality Date  . Appendectomy    . Orif tibia & fibula fractures      Right  . Eye surgery      Left; secondary to injury  . Coronary artery bypass graft  6237,6283  . Colonoscopy w/ polypectomy  2011     No Known Allergies    Family History  Problem Relation Age of Onset  . Clotting disorder Mother   . Heart attack Mother   . Lung cancer Father      Social History Mr. Heslin reports that he has been smoking.  He does not have any smokeless tobacco history on file. Mr. Messman reports that he does not drink alcohol.   Review of Systems CONSTITUTIONAL: No weight loss, fever, chills, weakness or fatigue.  HEENT: Eyes: No visual loss, blurred vision, double vision or yellow sclerae.No hearing loss, sneezing, congestion, runny nose or sore throat.  SKIN: No rash or itching.  CARDIOVASCULAR: per HPI RESPIRATORY: No shortness of breath, cough or sputum.  GASTROINTESTINAL: No anorexia, nausea, vomiting or diarrhea. No abdominal pain or blood.  GENITOURINARY: No burning on urination, no polyuria NEUROLOGICAL: No headache, dizziness, syncope, paralysis, ataxia, numbness or tingling in the extremities. No change in bowel or bladder control.  MUSCULOSKELETAL: No muscle, back pain, joint pain or stiffness.  LYMPHATICS: No enlarged nodes. No history of splenectomy.  PSYCHIATRIC: No history of depression or anxiety.  ENDOCRINOLOGIC: No reports of sweating, cold or heat intolerance. No polyuria or polydipsia.  .  02/21/13 Clinic EKG: sinus rhythm, PACs  Physical Examination p 69 bp 143/72 Wt 150 lbs BMI 27 Gen: resting comfortably, no acute distress HEENT: no scleral icterus, pupils equal round and reactive, no palptable cervical adenopathy,  CV: RRR, no m/r/g, no JVD, no  carotid bruits Resp: Clear to auscultation bilaterally GI: abdomen is soft, non-tender, non-distended, normal bowel sounds, no hepatosplenomegaly MSK: extremities are warm, no edema.  Skin: warm, no rash Neuro:  no focal deficits Psych: appropriate affect   Assessment and Plan  1. CAD/ICM - will repeat echo, if persistent LV dysfunction will need alterations in medication regimen  2. HTN - at goal per home numbers, continue current meds      Arnoldo Lenis, M.D., F.A.C.C.

## 2013-02-22 LAB — CBC
HCT: 49.5 % (ref 39.0–52.0)
Hemoglobin: 17.1 g/dL — ABNORMAL HIGH (ref 13.0–17.0)
MCH: 28.4 pg (ref 26.0–34.0)
MCHC: 34.5 g/dL (ref 30.0–36.0)
MCV: 82.1 fL (ref 78.0–100.0)
Platelets: 185 K/uL (ref 150–400)
RBC: 6.03 MIL/uL — ABNORMAL HIGH (ref 4.22–5.81)
RDW: 14.6 % (ref 11.5–15.5)
WBC: 7 K/uL (ref 4.0–10.5)

## 2013-02-22 LAB — COMPREHENSIVE METABOLIC PANEL WITH GFR
ALT: 9 U/L (ref 0–53)
AST: 9 U/L (ref 0–37)
Albumin: 4.1 g/dL (ref 3.5–5.2)
Alkaline Phosphatase: 77 U/L (ref 39–117)
BUN: 20 mg/dL (ref 6–23)
CO2: 22 meq/L (ref 19–32)
Calcium: 9.2 mg/dL (ref 8.4–10.5)
Chloride: 107 meq/L (ref 96–112)
Creat: 0.93 mg/dL (ref 0.50–1.35)
Glucose, Bld: 95 mg/dL (ref 70–99)
Potassium: 4.4 meq/L (ref 3.5–5.3)
Sodium: 139 meq/L (ref 135–145)
Total Bilirubin: 1.1 mg/dL (ref 0.2–1.2)
Total Protein: 5.9 g/dL — ABNORMAL LOW (ref 6.0–8.3)

## 2013-02-22 LAB — LIPID PANEL
Cholesterol: 87 mg/dL (ref 0–200)
HDL: 36 mg/dL — ABNORMAL LOW
LDL Cholesterol: 37 mg/dL (ref 0–99)
Total CHOL/HDL Ratio: 2.4 ratio
Triglycerides: 69 mg/dL
VLDL: 14 mg/dL (ref 0–40)

## 2013-02-22 LAB — TSH: TSH: 1.226 u[IU]/mL (ref 0.350–4.500)

## 2013-02-22 LAB — T4, FREE: FREE T4: 1.27 ng/dL (ref 0.80–1.80)

## 2013-02-28 ENCOUNTER — Encounter: Payer: Self-pay | Admitting: *Deleted

## 2013-03-01 ENCOUNTER — Ambulatory Visit (HOSPITAL_COMMUNITY)
Admission: RE | Admit: 2013-03-01 | Discharge: 2013-03-01 | Disposition: A | Discharge status: Home / Self Care | Payer: Medicare Other | Source: Ambulatory Visit | Attending: Cardiology | Admitting: Cardiology

## 2013-03-01 DIAGNOSIS — I10 Essential (primary) hypertension: Secondary | ICD-10-CM | POA: Insufficient documentation

## 2013-03-01 DIAGNOSIS — I359 Nonrheumatic aortic valve disorder, unspecified: Secondary | ICD-10-CM

## 2013-03-01 DIAGNOSIS — J4489 Other specified chronic obstructive pulmonary disease: Secondary | ICD-10-CM | POA: Insufficient documentation

## 2013-03-01 DIAGNOSIS — I2510 Atherosclerotic heart disease of native coronary artery without angina pectoris: Secondary | ICD-10-CM | POA: Insufficient documentation

## 2013-03-01 DIAGNOSIS — E039 Hypothyroidism, unspecified: Secondary | ICD-10-CM

## 2013-03-01 DIAGNOSIS — Z87891 Personal history of nicotine dependence: Secondary | ICD-10-CM | POA: Insufficient documentation

## 2013-03-01 DIAGNOSIS — J449 Chronic obstructive pulmonary disease, unspecified: Secondary | ICD-10-CM | POA: Insufficient documentation

## 2013-03-01 DIAGNOSIS — E782 Mixed hyperlipidemia: Secondary | ICD-10-CM

## 2013-03-01 DIAGNOSIS — E785 Hyperlipidemia, unspecified: Secondary | ICD-10-CM | POA: Insufficient documentation

## 2013-03-01 HISTORY — PX: TRANSTHORACIC ECHOCARDIOGRAM: SHX275

## 2013-03-01 NOTE — Progress Notes (Signed)
*  PRELIMINARY RESULTS* Echocardiogram 2D Echocardiogram has been performed.  [place], [place] 03/01/2013, 1:29 PM

## 2013-03-13 ENCOUNTER — Telehealth: Payer: Self-pay | Admitting: *Deleted

## 2013-03-13 MED ORDER — AMLODIPINE BESYLATE 5 MG PO TABS: ORAL_TABLET | ORAL | Status: DC

## 2013-03-13 NOTE — Telephone Encounter (Signed)
Received fax refill request from [facility]  Rx # Medication: amlodipine 5 mg Qty #30 Sig:   Physician: Arnold Long

## 2013-06-21 ENCOUNTER — Ambulatory Visit (INDEPENDENT_AMBULATORY_CARE_PROVIDER_SITE_OTHER): Payer: Medicare Other | Admitting: Cardiology

## 2013-06-21 ENCOUNTER — Encounter: Payer: Self-pay | Admitting: Cardiology

## 2013-06-21 VITALS — BP 128/62 | HR 87 | Ht 62.0 in | Wt 153.0 lb

## 2013-06-21 DIAGNOSIS — E782 Mixed hyperlipidemia: Secondary | ICD-10-CM

## 2013-06-21 DIAGNOSIS — I10 Essential (primary) hypertension: Secondary | ICD-10-CM

## 2013-06-21 DIAGNOSIS — I2510 Atherosclerotic heart disease of native coronary artery without angina pectoris: Secondary | ICD-10-CM

## 2013-06-21 MED ORDER — CARVEDILOL 3.125 MG PO TABS: 3.1250 mg | ORAL_TABLET | Freq: Two times a day (BID) | ORAL | Status: DC

## 2013-06-21 NOTE — Progress Notes (Signed)
Clinical Summary Vincent Carter is a 68 y.o.male seen today for follow up of the following medical problems.   1. CAD  - CABG in 1997, redo in 2006 as described below.  - ejection fraction in 2006 reported at 40-45%, we repeated echo after last visit, echo 02/2013 LVEF 45-50%.  - denies any chest pain, no SOB or DOE. No orthopnea, no PND, no LE edema   2. HTN   - compliant with meds  3. Hyperlipidemia Last panel 02/2013: TC 87 TG 69 HDL 36 LDL 37 Statins cause headaches and fatigue, currently not on therapy  4. Tobacco - not ready to quit completely, has decreased use since started using e-cig  Past Medical History  Diagnosis Date  . ASCVD (arteriosclerotic cardiovascular disease)     Coronary artery bypass graft surgery in 1997 with LIMA to the left anterior descending;redo surgery in 07/2004 for three-vessel disease; cath in 8/06-ejection fraction of 40-45% withanteroapical hypokinesis; occlusion of the saphenous vein graft to the RI; patent RIMA to left anteriordescending; unsuccessful PCI in totally occluded RI graft.  . Tobacco abuse, in remission     40 pack years discontinued in 2006; resumed 2009; discontinued again in 01/2012  . Chronic obstructive pulmonary disease   . Hypertension   . Ptosis     Left, secondary to injury  . Low TSH level     Clinically euthyroid  . Colonic polyp 2011    Colonoscopy + polypectomy  . Diverticulosis     History of diverticulitis  . Peripheral vascular disease     Decreased distal pulses and abnormal Doppler     No Known Allergies   Current Outpatient Prescriptions  Medication Sig Dispense Refill  . amLODipine (NORVASC) 5 MG tablet TAKE ONE TABLET BY MOUTH DAILY.  30 tablet  5  . aspirin 81 MG tablet Take 81 mg by mouth 2 (two) times daily.        . nitroGLYCERIN (NITROSTAT) 0.4 MG SL tablet Place 1 tablet (0.4 mg total) under the tongue every 5 (five) minutes as needed for chest pain.  25 tablet  6  . Omega-3 Fatty Acids (FISH  OIL) 300 MG CAPS Take 2 capsules by mouth 2 (two) times daily.         No current facility-administered medications for this visit.     Past Surgical History  Procedure Laterality Date  . Appendectomy    . Orif tibia & fibula fractures      Right  . Eye surgery      Left; secondary to injury  . Coronary artery bypass graft  [id number]  . Colonoscopy w/ polypectomy  2011     No Known Allergies    Family History  Problem Relation Age of Onset  . Clotting disorder Mother   . Heart attack Mother   . Lung cancer Father      Social History Vincent Carter reports that he has been smoking.  He does not have any smokeless tobacco history on file. Vincent Carter reports that he does not drink alcohol.   Review of Systems CONSTITUTIONAL: No weight loss, fever, chills, weakness or fatigue.  HEENT: Eyes: No visual loss, blurred vision, double vision or yellow sclerae.No hearing loss, sneezing, congestion, runny nose or sore throat.  SKIN: No rash or itching.  CARDIOVASCULAR: per HPI RESPIRATORY: No shortness of breath, cough or sputum.  GASTROINTESTINAL: No anorexia, nausea, vomiting or diarrhea. No abdominal pain or blood.  GENITOURINARY: No burning on urination,  no polyuria NEUROLOGICAL: No headache, dizziness, syncope, paralysis, ataxia, numbness or tingling in the extremities. No change in bowel or bladder control.  MUSCULOSKELETAL: No muscle, back pain, joint pain or stiffness.  LYMPHATICS: No enlarged nodes. No history of splenectomy.  PSYCHIATRIC: No history of depression or anxiety.  ENDOCRINOLOGIC: No reports of sweating, cold or heat intolerance. No polyuria or polydipsia.  Marland Kitchen   Physical Examination p 87 bp 128/62 Wt 153 lbs BMI 28 Gen: resting comfortably, no acute distress HEENT: no scleral icterus, pupils equal round and reactive, no palptable cervical adenopathy,  CV: RRR, no m/r/g, no JVD, no carotid bruits Resp: Clear to auscultation bilaterally GI: abdomen is  soft, non-tender, non-distended, normal bowel sounds, no hepatosplenomegaly MSK: extremities are warm, no edema.  Skin: warm, no rash Neuro:  no focal deficits Psych: appropriate affect   Diagnostic Studies  02/2013 Echo Study Conclusions  - Study data: Technically difficult study. - Left ventricle: The cavity size was normal. Wall thickness was normal. Systolic function waslow normal tomildly reduced. The estimated ejection fraction was in the range of 45% to 50%. - Aortic valve: Mildly calcified annulus. Trileaflet; mildly thickened leaflets. Mild regurgitation. Valve area: 2.53cm^2(VTI). Valve area: 2.42cm^2 (Vmax). - Mitral valve: Mildly calcified annulus. Mildly thickened leaflets . - Left atrium: The atrium was mildly dilated. - Right ventricle: The cavity size was mildly dilated. - Right atrium: The atrium was mildly dilated. - Atrial septum: No defect or patent foramen ovale was identified.     Assessment and Plan  1. CAD/ICM - repeat echo shows low normal to mildly decreased LV systolic function at 56-43%. - denies any current symptoms - in setting of mildly decreased LV function and CAD, change norvasc to coreg for secondary cardiac benefits  2. HTN  - follow bp's with change described above  3. Hyperlipidemia - intolerant to statins, despite lack of therapy lipid panel looks great.   4. Tobacco abuse - counseled on benefits of cessation, encouraged to quit - continue e-cig, he is not interested in nicotine patches.      Vincent Carter, M.D., F.A.C.C.

## 2013-06-21 NOTE — Patient Instructions (Signed)
Your physician wants you to follow-up in: 1 year with Dr.Branch You will receive a reminder letter in the mail two months in advance. If you don't receive a letter, please call our office to schedule the follow-up appointment.     Your physician has recommended you make the following change in your medication:     STOP Norvasc   START Coreg 3.125 mg twice a day    Thank you for choosing [facility] !

## 2013-07-27 ENCOUNTER — Ambulatory Visit (INDEPENDENT_AMBULATORY_CARE_PROVIDER_SITE_OTHER): Payer: Medicare Other | Admitting: Urology

## 2013-07-27 ENCOUNTER — Other Ambulatory Visit: Payer: Self-pay | Admitting: Urology

## 2013-07-27 DIAGNOSIS — R972 Elevated prostate specific antigen [PSA]: Secondary | ICD-10-CM

## 2013-07-27 DIAGNOSIS — N400 Enlarged prostate without lower urinary tract symptoms: Secondary | ICD-10-CM

## 2013-07-27 DIAGNOSIS — K4090 Unilateral inguinal hernia, without obstruction or gangrene, not specified as recurrent: Secondary | ICD-10-CM

## 2013-09-05 ENCOUNTER — Other Ambulatory Visit: Payer: Self-pay | Admitting: Urology

## 2013-09-05 DIAGNOSIS — R972 Elevated prostate specific antigen [PSA]: Secondary | ICD-10-CM

## 2013-09-08 ENCOUNTER — Encounter (HOSPITAL_COMMUNITY): Payer: Self-pay | Admitting: Emergency Medicine

## 2013-09-08 ENCOUNTER — Observation Stay (HOSPITAL_COMMUNITY)
Admission: EM | Admit: 2013-09-08 | Discharge: 2013-09-09 | Disposition: A | Discharge status: Home / Self Care | Payer: Medicare Other | Attending: Internal Medicine | Admitting: Internal Medicine

## 2013-09-08 ENCOUNTER — Emergency Department (HOSPITAL_COMMUNITY): Payer: Medicare Other

## 2013-09-08 DIAGNOSIS — R42 Dizziness and giddiness: Secondary | ICD-10-CM | POA: Diagnosis present

## 2013-09-08 DIAGNOSIS — I10 Essential (primary) hypertension: Secondary | ICD-10-CM | POA: Diagnosis not present

## 2013-09-08 DIAGNOSIS — I739 Peripheral vascular disease, unspecified: Secondary | ICD-10-CM | POA: Insufficient documentation

## 2013-09-08 DIAGNOSIS — J4489 Other specified chronic obstructive pulmonary disease: Secondary | ICD-10-CM | POA: Insufficient documentation

## 2013-09-08 DIAGNOSIS — H81399 Other peripheral vertigo, unspecified ear: Secondary | ICD-10-CM

## 2013-09-08 DIAGNOSIS — I517 Cardiomegaly: Secondary | ICD-10-CM | POA: Diagnosis present

## 2013-09-08 DIAGNOSIS — J449 Chronic obstructive pulmonary disease, unspecified: Secondary | ICD-10-CM | POA: Insufficient documentation

## 2013-09-08 DIAGNOSIS — I498 Other specified cardiac arrhythmias: Secondary | ICD-10-CM | POA: Diagnosis present

## 2013-09-08 DIAGNOSIS — I499 Cardiac arrhythmia, unspecified: Secondary | ICD-10-CM

## 2013-09-08 DIAGNOSIS — R079 Chest pain, unspecified: Secondary | ICD-10-CM | POA: Diagnosis present

## 2013-09-08 LAB — CBC WITH DIFFERENTIAL/PLATELET
BAND NEUTROPHILS: 0 % (ref 0–10)
Basophils Absolute: 0 10*3/uL (ref 0.0–0.1)
Basophils Relative: 0 % (ref 0–1)
Blasts: 0 %
Eosinophils Absolute: 0 10*3/uL (ref 0.0–0.7)
Eosinophils Relative: 0 % (ref 0–5)
HEMATOCRIT: 50.9 % (ref 39.0–52.0)
HEMOGLOBIN: 17.8 g/dL — AB (ref 13.0–17.0)
Lymphocytes Relative: 31 % (ref 12–46)
Lymphs Abs: 2.7 10*3/uL (ref 0.7–4.0)
MCH: 28.8 pg (ref 26.0–34.0)
MCHC: 35 g/dL (ref 30.0–36.0)
MCV: 82.5 fL (ref 78.0–100.0)
MYELOCYTES: 0 %
Metamyelocytes Relative: 0 %
Monocytes Absolute: 0.3 10*3/uL (ref 0.1–1.0)
Monocytes Relative: 4 % (ref 3–12)
NEUTROS PCT: 65 % (ref 43–77)
Neutro Abs: 5.6 10*3/uL (ref 1.7–7.7)
PROMYELOCYTES ABS: 0 %
Platelets: 168 10*3/uL (ref 150–400)
RBC: 6.17 MIL/uL — ABNORMAL HIGH (ref 4.22–5.81)
RDW: 14.4 % (ref 11.5–15.5)
WBC: 8.6 10*3/uL (ref 4.0–10.5)
nRBC: 0 /100 WBC

## 2013-09-08 LAB — BASIC METABOLIC PANEL
ANION GAP: 13 (ref 5–15)
BUN: 21 mg/dL (ref 6–23)
CHLORIDE: 105 meq/L (ref 96–112)
CO2: 25 mEq/L (ref 19–32)
Calcium: 9.4 mg/dL (ref 8.4–10.5)
Creatinine, Ser: 1.05 mg/dL (ref 0.50–1.35)
GFR calc non Af Amer: 70 mL/min — ABNORMAL LOW (ref >90)
GFR, EST AFRICAN AMERICAN: 82 mL/min — AB (ref >90)
Glucose, Bld: 88 mg/dL (ref 70–99)
POTASSIUM: 3.7 meq/L (ref 3.7–5.3)
Sodium: 143 mEq/L (ref 137–147)

## 2013-09-08 LAB — TROPONIN I: Troponin I: <0.3 ng/mL (ref <0.30)

## 2013-09-08 MED ORDER — ONDANSETRON HCL 4 MG/2ML IJ SOLN
4.0000 mg | INTRAMUSCULAR | Status: DC | PRN
  Administered 2013-09-08: 4 mg via INTRAVENOUS
  Filled 2013-09-08: qty 2

## 2013-09-08 MED ORDER — ACETAMINOPHEN 650 MG RE SUPP: 650.0000 mg | Freq: Four times a day (QID) | RECTAL | Status: DC | PRN

## 2013-09-08 MED ORDER — ASPIRIN 81 MG PO CHEW
CHEWABLE_TABLET | ORAL | Status: AC
  Filled 2013-09-08: qty 4

## 2013-09-08 MED ORDER — ASPIRIN EC 81 MG PO TBEC
81.0000 mg | DELAYED_RELEASE_TABLET | Freq: Two times a day (BID) | ORAL | Status: DC
  Administered 2013-09-09: 81 mg via ORAL
  Filled 2013-09-08 (×3): qty 1

## 2013-09-08 MED ORDER — ACETAMINOPHEN 325 MG PO TABS: 650.0000 mg | ORAL_TABLET | Freq: Four times a day (QID) | ORAL | Status: DC | PRN

## 2013-09-08 MED ORDER — SODIUM CHLORIDE 0.9 % IJ SOLN
3.0000 mL | Freq: Two times a day (BID) | INTRAMUSCULAR | Status: DC
  Administered 2013-09-09 (×2): 3 mL via INTRAVENOUS

## 2013-09-08 MED ORDER — ENOXAPARIN SODIUM 40 MG/0.4ML ~~LOC~~ SOLN
40.0000 mg | SUBCUTANEOUS | Status: DC
  Filled 2013-09-08: qty 0.4

## 2013-09-08 MED ORDER — DOCUSATE SODIUM 100 MG PO CAPS
100.0000 mg | ORAL_CAPSULE | Freq: Every day | ORAL | Status: DC | PRN
  Filled 2013-09-08: qty 1

## 2013-09-08 MED ORDER — SODIUM CHLORIDE 0.9 % IV SOLN
INTRAVENOUS | Status: DC
  Administered 2013-09-09 (×2): via INTRAVENOUS

## 2013-09-08 MED ORDER — ALUM & MAG HYDROXIDE-SIMETH 200-200-20 MG/5ML PO SUSP: 30.0000 mL | Freq: Four times a day (QID) | ORAL | Status: DC | PRN

## 2013-09-08 MED ORDER — CARVEDILOL 3.125 MG PO TABS
3.1250 mg | ORAL_TABLET | Freq: Two times a day (BID) | ORAL | Status: DC
  Filled 2013-09-08 (×3): qty 1

## 2013-09-08 MED ORDER — MECLIZINE HCL 12.5 MG PO TABS
25.0000 mg | ORAL_TABLET | Freq: Once | ORAL | Status: AC
  Administered 2013-09-08: 25 mg via ORAL
  Filled 2013-09-08: qty 2

## 2013-09-08 MED ORDER — ASPIRIN 81 MG PO CHEW
324.0000 mg | CHEWABLE_TABLET | Freq: Once | ORAL | Status: AC
  Administered 2013-09-08: 324 mg via ORAL

## 2013-09-08 MED ORDER — NICOTINE 21 MG/24HR TD PT24
21.0000 mg | MEDICATED_PATCH | Freq: Every day | TRANSDERMAL | Status: DC
  Administered 2013-09-09: 21 mg via TRANSDERMAL
  Filled 2013-09-08: qty 1

## 2013-09-08 NOTE — H&P (Signed)
History and Physical  Vincent Carter YHC:[id number] DOB: 1945-11-15 DOA: 09/08/2013  Referring physician: Dr Thurnell Garbe, ED physician PCP: Glo Herring., MD   Chief Complaint: Chest pain  HPI: Vincent Carter is a 68 y.o. male  With a history of COPD, hypertension, coronary artery disease status post coronary artery bypass graft with a revision and status post MI. He presents to the hospital with chest pain that started earlier today. Chest pain was located in the left chest and accompanied with diaphoresis, lightheadedness, dizziness, nausea. He had been riding his motorcycle with his family in [place] of his son's death. He arrived home when his symptoms started. His family brought him to the hospital to be evaluated. He received aspirin in the emergency room. In questioning the patient, he been having chest pain off and on over the past week or 2 is located in the left chest and lasts for 2-3 minutes at a time. This pain usually occurs when lying down at night.  He also complained of vertigo, which occurred at similar time as the onset of his chest pain. He was recently treated for an otitis media with Augmentin. Denies hearing loss, slurred speech, weakness, sensation changes. Vertigo worse with movement of head. Denies head trauma. The patient received meclizine in the ER, which resolved his vertigo.  Review of Systems:   Pt complains of vertigo.  Pt denies any fevers, chills, abdominal pain, palpitations.  Review of systems are otherwise negative  Past Medical History  Diagnosis Date  . ASCVD (arteriosclerotic cardiovascular disease)     Coronary artery bypass graft surgery in 1997 with LIMA to the left anterior descending;redo surgery in 07/2004 for three-vessel disease; cath in 8/06-ejection fraction of 40-45% withanteroapical hypokinesis; occlusion of the saphenous vein graft to the RI; patent RIMA to left anteriordescending; unsuccessful PCI in totally occluded RI graft.  .  Tobacco abuse, in remission     40 pack years discontinued in 2006; resumed 2009; discontinued again in 01/2012  . Chronic obstructive pulmonary disease   . Hypertension   . Ptosis     Left, secondary to injury  . Low TSH level     Clinically euthyroid  . Colonic polyp 2011    Colonoscopy + polypectomy  . Diverticulosis     History of diverticulitis  . Peripheral vascular disease     Decreased distal pulses and abnormal Doppler   Past Surgical History  Procedure Laterality Date  . Appendectomy    . Orif tibia & fibula fractures      Right  . Eye surgery      Left; secondary to injury  . Coronary artery bypass graft  5176,1607  . Colonoscopy w/ polypectomy  2011   Social History:  reports that he has been smoking.  He does not have any smokeless tobacco history on file. He reports that he does not drink alcohol or use illicit drugs. Patient lives at home & is able to participate in activities of daily living without assistance  Allergies  Allergen Reactions  . Codeine Nausea Only    Family History  Problem Relation Age of Onset  . Clotting disorder Mother   . Heart attack Mother   . Lung cancer Father      Prior to Admission medications   Medication Sig Start Date End Date Taking? Authorizing Provider  amoxicillin-clavulanate (AUGMENTIN) 875-125 MG per tablet Take 1 tablet by mouth 2 (two) times daily. For 10 days(started on 08/31/13) 08/31/13  Yes Historical Provider, MD  aspirin EC 81 MG tablet Take 81 mg by mouth 2 (two) times daily.   Yes Historical Provider, MD  carvedilol (COREG) 3.125 MG tablet Take 1 tablet (3.125 mg total) by mouth 2 (two) times daily. 06/21/13  Yes Arnoldo Lenis, MD  Omega-3 Fatty Acids (FISH OIL) 300 MG CAPS Take 2 capsules by mouth 2 (two) times daily.     Yes Historical Provider, MD  nitroGLYCERIN (NITROSTAT) 0.4 MG SL tablet Place 1 tablet (0.4 mg total) under the tongue every 5 (five) minutes as needed for chest pain. 02/21/13 04/16/14   Arnoldo Lenis, MD    Physical Exam: BP 160/96  Pulse 58  Temp(Src) 97.7 F (36.5 C)  Resp 24  SpO2 96%  General: Middle-aged Caucasian male. Awake and alert and oriented x3. No acute cardiopulmonary distress.  Eyes: Pupils equal, round, reactive to light. Extraocular muscles are intact. Sclerae anicteric and noninjected.  ENT: External auditory canals are patent and tympanic membranes reflect a good cone of light. Moist mucosal membranes. No mucosal lesions. Edentulous.  Neck: Neck supple without lymphadenopathy. No carotid bruits. No masses palpated.  Cardiovascular: Regular rate with normal S1-S2 sounds. No murmurs, rubs, gallops auscultated. No JVD.  Respiratory: Good respiratory effort with no wheezes, rales, rhonchi. Lungs clear to auscultation bilaterally.  Abdomen: Soft, nontender, nondistended. Active bowel sounds. No masses or hepatosplenomegaly  Skin: Dry, warm to touch. 2+ dorsalis pedis and radial pulses. Musculoskeletal: No calf or leg pain. All major joints not erythematous nontender. No chest pain to palpation  Psychiatric: Intact judgment and insight.  Neurologic: No focal neurological deficits. Cranial nerves II through XII are grossly intact.           Labs on Admission:  Basic Metabolic Panel:  Recent Labs Lab 09/08/13 1750  NA 143  K 3.7  CL 105  CO2 25  GLUCOSE 88  BUN 21  CREATININE 1.05  CALCIUM 9.4   Liver Function Tests: No results found for this basename: AST, ALT, ALKPHOS, BILITOT, PROT, ALBUMIN,  in the last 168 hours No results found for this basename: LIPASE, AMYLASE,  in the last 168 hours No results found for this basename: AMMONIA,  in the last 168 hours CBC:  Recent Labs Lab 09/08/13 1750  WBC 8.6  NEUTROABS 5.6  HGB 17.8*  HCT 50.9  MCV 82.5  PLT 168   Cardiac Enzymes:  Recent Labs Lab 09/08/13 1750  TROPONINI <0.30    BNP (last 3 results) No results found for this basename: PROBNP,  in the last 8760  hours CBG: No results found for this basename: GLUCAP,  in the last 168 hours  Radiological Exams on Admission: Dg Chest 2 View  09/08/2013   CLINICAL DATA:  68 year old with chest pain.  EXAM: CHEST  2 VIEW  COMPARISON:  02/21/2012 and prior chest radiographs dating back to 07/27/2004  FINDINGS: Cardiomegaly and CABG changes again identified.  Mild peribronchial thickening is unchanged.  There is no evidence of focal airspace disease, pulmonary edema, suspicious pulmonary nodule/mass, pleural effusion, or pneumothorax. No acute bony abnormalities are identified.  IMPRESSION: Cardiomegaly without evidence of acute cardiopulmonary disease.   Electronically Signed   By: Hassan Rowan M.D.   On: 09/08/2013 18:44   Ct Head Wo Contrast  09/08/2013   CLINICAL DATA:  68 year old male with weakness.  EXAM: CT HEAD WITHOUT CONTRAST  TECHNIQUE: Contiguous axial images were obtained from the base of the skull through the vertex without intravenous contrast.  COMPARISON:  None.  FINDINGS: Mild periventricular white matter hypodensities are nonspecific but may be related to chronic small-vessel white matter ischemic changes.  A remote left basal ganglia lacunar infarct is noted.  No acute intracranial abnormalities are identified, including mass lesion or mass effect, hydrocephalus, extra-axial fluid collection, midline shift, hemorrhage, or acute infarction.  The visualized bony calvarium is unremarkable.  IMPRESSION: No evidence of acute intracranial abnormality.  Mild nonspecific periventricular white matter hypodensities - probably related to chronic small-vessel white matter ischemic changes.  Remote left basal ganglia lacunar infarct.   Electronically Signed   By: Hassan Rowan M.D.   On: 09/08/2013 18:51    EKG: Independently reviewed. Sinus rhythm with normal QRS and PR intervals. 2 PVCs seen. New flat T waves in V5 and V6.  Assessment/Plan Present on Admission:  . Acute chest pain  #1 acute chest pain Due to  the patient's high risk, we'll transfer the patient to [facility] for observation and for cardiac consult for stress testing. We'll continue the patient's troponins to rule out acute coronary syndrome. Continue aspirin. Hold beta blocker for possible stress testing tomorrow.  #2 vertigo Continue meclizine as needed  #3 bigeminy - paroxysmal, asymptomatic  #4 hypertension Hold Coreg tomorrow morning otherwise continue.  DVT prophylaxis: Lovenox  Consultants: Cardiology  Code Status: Full  Family Communication: Daughter in Sports coach, son, grandchildren   Disposition Plan: Home following testing  Time spent: 81 minutes  Vincent Carter, [place] [facility] Pager [phone]  **Disclaimer: This note may have been dictated with voice recognition software. Similar sounding words can inadvertently be transcribed and this note may contain transcription errors which may not have been corrected upon publication of note.**

## 2013-09-08 NOTE — ED Notes (Addendum)
Pt presents to ED c/o chest pain that has lessened since it started. Pt states that he has a high threshold of pain and that it hurt "a good bit." Pt has been feeling weak for several days. Pt in good spirits and NAD noted.

## 2013-09-08 NOTE — ED Notes (Signed)
Pt presents to ED c/o chest pain since 4PM. Pain level "hurts right bad." Pt states he has high pain threshold. Pt states he has had previous heart attacks and this pain is similar.

## 2013-09-08 NOTE — ED Provider Notes (Signed)
CSN: [id number]     Arrival date & time 09/08/13  1740 History   First MD Initiated Contact with Patient 09/08/13 1754     Chief Complaint  Patient presents with  . Chest Pain  . Dizziness     HPI Pt was seen at 1800. Per pt and his family, c/o sudden onset and persistence of constant "dizziness" that began this afternoon PTA. Pt describes the "dizziness" as "everything is moving around" and "feeling like I'm drunk when I try to walk." Has been associated with diaphoresis and nausea. Pt states the "dizziness" worsens with turning his head side to side and changing body positions; improves with "keeping my head still." States he was dx last week with AOM by his PMD and "prescribed antibiotics that I stopped taking after a few days." Pt also c/o gradual onset and improvement in one episode of chest "pain" that began approximately 1600 PTA. Pt described the CP as "pressure" and "like my heart pain." States he took one baby ASA with improvement of his CP. Denies palpitations, no cough, no abd pain, no vomiting/diarrhea, no back pain, no fevers, no focal motor weakness, no tingling/numbness in extremities, no facial droop, no slurred speech, no near syncope/syncope.    Past Medical History  Diagnosis Date  . ASCVD (arteriosclerotic cardiovascular disease)     Coronary artery bypass graft surgery in 1997 with LIMA to the left anterior descending;redo surgery in 07/2004 for three-vessel disease; cath in 8/06-ejection fraction of 40-45% withanteroapical hypokinesis; occlusion of the saphenous vein graft to the RI; patent RIMA to left anteriordescending; unsuccessful PCI in totally occluded RI graft.  . Tobacco abuse, in remission     40 pack years discontinued in 2006; resumed 2009; discontinued again in 01/2012  . Chronic obstructive pulmonary disease   . Hypertension   . Ptosis     Left, secondary to injury  . Low TSH level     Clinically euthyroid  . Colonic polyp 2011    Colonoscopy + polypectomy   . Diverticulosis     History of diverticulitis  . Peripheral vascular disease     Decreased distal pulses and abnormal Doppler   Past Surgical History  Procedure Laterality Date  . Appendectomy    . Orif tibia & fibula fractures      Right  . Eye surgery      Left; secondary to injury  . Coronary artery bypass graft  6948,5462  . Colonoscopy w/ polypectomy  2011   Family History  Problem Relation Age of Onset  . Clotting disorder Mother   . Heart attack Mother   . Lung cancer Father    History  Substance Use Topics  . Smoking status: Current Every Day Smoker -- 1.00 packs/day for 50 years  . Smokeless tobacco: Not on file  . Alcohol Use: No    Review of Systems ROS: Statement: All systems negative except as marked or noted in the HPI; Constitutional: Negative for fever and chills. +generalized weakness/fatigue.; ; Eyes: Negative for eye pain, redness and discharge. ; ; ENMT: Negative for ear pain, hoarseness, nasal congestion, sinus pressure and sore throat. ; ; Cardiovascular: +CP, diaphoresis. Negative for palpitations, dyspnea and peripheral edema. ; ; Respiratory: Negative for cough, wheezing and stridor. ; ; Gastrointestinal: +nausea. Negative for vomiting, diarrhea, abdominal pain, blood in stool, hematemesis, jaundice and rectal bleeding. . ; ; Genitourinary: Negative for dysuria, flank pain and hematuria. ; ; Musculoskeletal: Negative for back pain and neck pain. Negative for swelling  and trauma.; ; Skin: Negative for pruritus, rash, abrasions, blisters, bruising and skin lesion.; ; Neuro: +"dizziness." Negative for headache, lightheadedness and neck stiffness. Negative for weakness, altered level of consciousness , altered mental status, extremity weakness, paresthesias, involuntary movement, seizure and syncope.      Allergies  Codeine  Home Medications   Prior to Admission medications   Medication Sig Start Date End Date Taking? Authorizing Provider   amoxicillin-clavulanate (AUGMENTIN) 875-125 MG per tablet Take 1 tablet by mouth 2 (two) times daily. For 10 days(started on 08/31/13) 08/31/13  Yes Historical Provider, MD  aspirin EC 81 MG tablet Take 81 mg by mouth 2 (two) times daily.   Yes Historical Provider, MD  carvedilol (COREG) 3.125 MG tablet Take 1 tablet (3.125 mg total) by mouth 2 (two) times daily. 06/21/13  Yes Arnoldo Lenis, MD  Omega-3 Fatty Acids (FISH OIL) 300 MG CAPS Take 2 capsules by mouth 2 (two) times daily.     Yes Historical Provider, MD  nitroGLYCERIN (NITROSTAT) 0.4 MG SL tablet Place 1 tablet (0.4 mg total) under the tongue every 5 (five) minutes as needed for chest pain. 02/21/13 04/16/14  Arnoldo Lenis, MD   BP 160/96  Pulse 58  Temp(Src) 97.7 F (36.5 C)  Resp 24  SpO2 96% Physical Exam 1805: Physical examination:  Nursing notes reviewed; Vital signs and O2 SAT reviewed;  Constitutional: Well developed, Well nourished, Well hydrated, In no acute distress; Head:  Normocephalic, atraumatic; Eyes: EOMI, PERRL, No scleral icterus; ENMT: +purulent fluid behind right TM. +clear fluid behind left TM. +edemetous nasal turbinates bilat with clear rhinorrhea. Mouth and pharynx normal, Mucous membranes moist; Neck: Supple, Full range of motion, No lymphadenopathy; Cardiovascular: Regular rate and rhythm, No gallop; Respiratory: Breath sounds clear & equal bilaterally, No wheezes.  Speaking full sentences with ease, Normal respiratory effort/excursion; Chest: Nontender, Movement normal; Abdomen: Soft, Nontender, Nondistended, Normal bowel sounds; Genitourinary: No CVA tenderness; Extremities: Pulses normal, No tenderness, No edema, No calf edema or asymmetry.; Neuro: AA&Ox3, Major CN grossly intact.Speech clear.  No facial droop.  +left horizontal end gaze fatigable nystagmus. Grips equal. Strength 5/5 equal bilat UE's and LE's.  DTR 2/4 equal bilat UE's and LE's.  No gross sensory deficits.  Normal cerebellar testing bilat  UE's (finger-nose) and LE's (heel-shin)..; Skin: Color normal, Warm, Dry.   ED Course  Procedures     EKG Interpretation   Date/Time:  Saturday September 08 2013 17:51:35 EDT Ventricular Rate:  54 PR Interval:  179 QRS Duration: 86 QT Interval:  428 QTC Calculation: 406 R Axis:   99 Text Interpretation:  Sinus rhythm Probable anterolateral infarct, age  indeterm T wave abnormality Inferolateral leads When compared with ECG of  12/08/2005 T wave abnormality Inferolateral leads is now Present Confirmed  by [facility]  MD, Nunzio Cory [id number]) on 09/08/2013 7:02:58 PM      MDM  MDM Reviewed: previous chart, nursing note and vitals Reviewed previous: labs and ECG Interpretation: labs, ECG, x-ray and CT scan    Results for orders placed during the hospital encounter of 09/08/13  CBC WITH DIFFERENTIAL      Result Value Ref Range   WBC 8.6  4.0 - 10.5 K/uL   RBC 6.17 (*) 4.22 - 5.81 MIL/uL   Hemoglobin 17.8 (*) 13.0 - 17.0 g/dL   HCT 50.9  39.0 - 52.0 %   MCV 82.5  78.0 - 100.0 fL   MCH 28.8  26.0 - 34.0 pg   MCHC 35.0  30.0 - 36.0 g/dL   RDW 14.4  11.5 - 15.5 %   Platelets 168  150 - 400 K/uL   Neutrophils Relative % 65  43 - 77 %   Lymphocytes Relative 31  12 - 46 %   Monocytes Relative 4  3 - 12 %   Eosinophils Relative 0  0 - 5 %   Basophils Relative 0  0 - 1 %   Band Neutrophils 0  0 - 10 %   Metamyelocytes Relative 0     Myelocytes 0     Promyelocytes Absolute 0     Blasts 0     nRBC 0  0 /100 WBC   Neutro Abs 5.6  1.7 - 7.7 K/uL   Lymphs Abs 2.7  0.7 - 4.0 K/uL   Monocytes Absolute 0.3  0.1 - 1.0 K/uL   Eosinophils Absolute 0.0  0.0 - 0.7 K/uL   Basophils Absolute 0.0  0.0 - 0.1 K/uL   WBC Morphology ATYPICAL LYMPHOCYTES    BASIC METABOLIC PANEL      Result Value Ref Range   Sodium 143  137 - 147 mEq/L   Potassium 3.7  3.7 - 5.3 mEq/L   Chloride 105  96 - 112 mEq/L   CO2 25  19 - 32 mEq/L   Glucose, Bld 88  70 - 99 mg/dL   BUN 21  6 - 23 mg/dL   Creatinine,  Ser 1.05  0.50 - 1.35 mg/dL   Calcium 9.4  8.4 - 10.5 mg/dL   GFR calc non Af Amer 70 (*) >90 mL/min   GFR calc Af Amer 82 (*) >90 mL/min   Anion gap 13  5 - 15  TROPONIN I      Result Value Ref Range   Troponin I <0.30  <0.30 ng/mL   Dg Chest 2 View 09/08/2013   CLINICAL DATA:  68 year old with chest pain.  EXAM: CHEST  2 VIEW  COMPARISON:  02/21/2012 and prior chest radiographs dating back to 07/27/2004  FINDINGS: Cardiomegaly and CABG changes again identified.  Mild peribronchial thickening is unchanged.  There is no evidence of focal airspace disease, pulmonary edema, suspicious pulmonary nodule/mass, pleural effusion, or pneumothorax. No acute bony abnormalities are identified.  IMPRESSION: Cardiomegaly without evidence of acute cardiopulmonary disease.   Electronically Signed   By: Hassan Rowan M.D.   On: 09/08/2013 18:44   Ct Head Wo Contrast 09/08/2013   CLINICAL DATA:  68 year old male with weakness.  EXAM: CT HEAD WITHOUT CONTRAST  TECHNIQUE: Contiguous axial images were obtained from the base of the skull through the vertex without intravenous contrast.  COMPARISON:  None.  FINDINGS: Mild periventricular white matter hypodensities are nonspecific but may be related to chronic small-vessel white matter ischemic changes.  A remote left basal ganglia lacunar infarct is noted.  No acute intracranial abnormalities are identified, including mass lesion or mass effect, hydrocephalus, extra-axial fluid collection, midline shift, hemorrhage, or acute infarction.  The visualized bony calvarium is unremarkable.  IMPRESSION: No evidence of acute intracranial abnormality.  Mild nonspecific periventricular white matter hypodensities - probably related to chronic small-vessel white matter ischemic changes.  Remote left basal ganglia lacunar infarct.   Electronically Signed   By: Hassan Rowan M.D.   On: 09/08/2013 18:51    2025:  Pt's vertigo improved after Antivert. Pt continues to deny any further CP while in the  ED. Concern regarding pt's significant cardiac hx; will admit. Dx and testing d/w pt and family.  Questions answered.  Verb understanding, agreeable to admit. T/C to [facility]hem[facility] Settle, case discussed, including:  HPI, pertinent PM/SHx, VS/PE, dx testing, ED course and treatment:  Agrees with admission, requests he will come to the ED for evaluation for admission and possible transfer to [facility].     Francine Graven, DO 09/10/13 [phone]

## 2013-09-09 ENCOUNTER — Encounter (HOSPITAL_COMMUNITY): Payer: Self-pay | Admitting: *Deleted

## 2013-09-09 ENCOUNTER — Observation Stay (HOSPITAL_COMMUNITY): Payer: Medicare Other

## 2013-09-09 DIAGNOSIS — I517 Cardiomegaly: Secondary | ICD-10-CM | POA: Diagnosis not present

## 2013-09-09 DIAGNOSIS — I10 Essential (primary) hypertension: Secondary | ICD-10-CM

## 2013-09-09 DIAGNOSIS — R079 Chest pain, unspecified: Secondary | ICD-10-CM

## 2013-09-09 DIAGNOSIS — J449 Chronic obstructive pulmonary disease, unspecified: Secondary | ICD-10-CM | POA: Diagnosis not present

## 2013-09-09 DIAGNOSIS — I739 Peripheral vascular disease, unspecified: Secondary | ICD-10-CM | POA: Diagnosis not present

## 2013-09-09 LAB — BASIC METABOLIC PANEL
Anion gap: 10 (ref 5–15)
BUN: 20 mg/dL (ref 6–23)
CO2: 22 meq/L (ref 19–32)
Calcium: 8 mg/dL — ABNORMAL LOW (ref 8.4–10.5)
Chloride: 111 mEq/L (ref 96–112)
Creatinine, Ser: 0.98 mg/dL (ref 0.50–1.35)
GFR calc Af Amer: >90 mL/min (ref >90)
GFR calc non Af Amer: 82 mL/min — ABNORMAL LOW (ref >90)
GLUCOSE: 103 mg/dL — AB (ref 70–99)
POTASSIUM: 3.8 meq/L (ref 3.7–5.3)
SODIUM: 143 meq/L (ref 137–147)

## 2013-09-09 LAB — CBC
HEMATOCRIT: 45.3 % (ref 39.0–52.0)
HEMOGLOBIN: 15.5 g/dL (ref 13.0–17.0)
MCH: 28.2 pg (ref 26.0–34.0)
MCHC: 34.2 g/dL (ref 30.0–36.0)
MCV: 82.4 fL (ref 78.0–100.0)
Platelets: 174 10*3/uL (ref 150–400)
RBC: 5.5 MIL/uL (ref 4.22–5.81)
RDW: 14.3 % (ref 11.5–15.5)
WBC: 9.4 10*3/uL (ref 4.0–10.5)

## 2013-09-09 LAB — TROPONIN I

## 2013-09-09 MED ORDER — REGADENOSON 0.4 MG/5ML IV SOLN
0.4000 mg | Freq: Once | INTRAVENOUS | Status: AC
  Administered 2013-09-09: 0.4 mg via INTRAVENOUS
  Filled 2013-09-09: qty 5

## 2013-09-09 MED ORDER — ENOXAPARIN SODIUM 40 MG/0.4ML ~~LOC~~ SOLN: 40.0000 mg | SUBCUTANEOUS | Status: DC

## 2013-09-09 MED ORDER — REGADENOSON 0.4 MG/5ML IV SOLN
INTRAVENOUS | Status: AC
  Administered 2013-09-09: 0.4 mg via INTRAVENOUS
  Filled 2013-09-09: qty 5

## 2013-09-09 MED ORDER — TECHNETIUM TC 99M SESTAMIBI - CARDIOLITE
10.0000 | Freq: Once | INTRAVENOUS | Status: AC | PRN
  Administered 2013-09-09: 10 via INTRAVENOUS

## 2013-09-09 MED ORDER — TECHNETIUM TC 99M SESTAMIBI - CARDIOLITE
30.0000 | Freq: Once | INTRAVENOUS | Status: AC | PRN
  Administered 2013-09-09: 30 via INTRAVENOUS

## 2013-09-09 NOTE — Progress Notes (Signed)
Utilization Review Completed.   Josephine Rudnick, RN, BSN Nurse Case Manager  

## 2013-09-09 NOTE — Progress Notes (Signed)
Patient discussed with primary team, known history of CAD presents with chest pain. Will arrange stress testing to further evaluate. Order exercise stress nuclear, will hold coreg this AM. Patient is ordered as NPO.   Zandra Abts MD

## 2013-09-09 NOTE — Progress Notes (Signed)
Stress test reviewed, only evidence of old inferior scar, no current ischemia. LVEF at 49% is within normal parameters for nuclear scans and consistent with prior echo 02/2013. Do not see indication for cardiology consult at this time, continue medical management and may follow up with [place] as outpatient. Would increase his coreg to 6.25mg  bid in setting of systolic dysfunction and also as increased antianginal.   Zandra Abts MD

## 2013-09-09 NOTE — Discharge Instructions (Signed)
Cardiac Diet This diet can help prevent heart disease and stroke. Many factors influence your heart health, including eating and exercise habits. Coronary risk rises a lot with abnormal blood fat (lipid) levels. Cardiac meal planning includes limiting unhealthy fats, increasing healthy fats, and making other small dietary changes. General guidelines are as follows:  Adjust calorie intake to reach and maintain desirable body weight.  Limit total fat intake to less than 30% of total calories. Saturated fat should be less than 7% of calories.  Saturated fats are found in animal products and in some vegetable products. Saturated vegetable fats are found in coconut oil, cocoa butter, palm oil, and palm kernel oil. Read labels carefully to avoid these products as much as possible. Use butter in moderation. Choose tub margarines and oils that have 2 grams of fat or less. Good cooking oils are canola and olive oils.  Practice low-fat cooking techniques. Do not fry food. Instead, broil, bake, boil, steam, grill, roast on a rack, stir-fry, or microwave it. Other fat reducing suggestions include:  Remove the skin from poultry.  Remove all visible fat from meats.  Skim the fat off stews, soups, and gravies before serving them.  Steam vegetables in water or broth instead of sauting them in fat.  Avoid foods with trans fat (or hydrogenated oils), such as commercially fried foods and commercially baked goods. Commercial shortening and deep-frying fats will contain trans fat.  Increase intake of fruits, vegetables, whole grains, and legumes to replace foods high in fat.  Increase consumption of nuts, legumes, and seeds to at least 4 servings weekly. One serving of a legume equals  cup, and 1 serving of nuts or seeds equals  cup.  Choose whole grains more often. Have 3 servings per day (a serving is 1 ounce [oz]).  Eat 4 to 5 servings of vegetables per day. A serving of vegetables is 1 cup of raw leafy  vegetables;  cup of raw or cooked cut-up vegetables;  cup of vegetable juice.  Eat 4 to 5 servings of fruit per day. A serving of fruit is 1 medium whole fruit;  cup of dried fruit;  cup of fresh, frozen, or canned fruit;  cup of 100% fruit juice.  Increase your intake of dietary fiber to 20 to 30 grams per day. Insoluble fiber may help lower your risk of heart disease and may help curb your appetite.  Soluble fiber binds cholesterol to be removed from the blood. Foods high in soluble fiber are dried beans, citrus fruits, oats, apples, bananas, broccoli, Brussels sprouts, and eggplant.  Try to include foods fortified with plant sterols or stanols, such as yogurt, breads, juices, or margarines. Choose several fortified foods to achieve a daily intake of 2 to 3 grams of plant sterols or stanols.  Foods with omega-3 fats can help reduce your risk of heart disease. Aim to have a 3.5 oz portion of fatty fish twice per week, such as salmon, mackerel, albacore tuna, sardines, lake trout, or herring. If you wish to take a fish oil supplement, choose one that contains 1 gram of both DHA and EPA.  Limit processed meats to 2 servings (3 oz portion) weekly.  Limit the sodium in your diet to 1500 milligrams (mg) per day. If you have high blood pressure, talk to a registered dietitian about a DASH (Dietary Approaches to Stop Hypertension) eating plan.  Limit sweets and beverages with added sugar, such as soda, to no more than 5 servings per week. One   serving is:   1 tablespoon sugar.  1 tablespoon jelly or jam.   cup sorbet.  1 cup lemonade.   cup regular soda. CHOOSING FOODS Starches  Allowed: Breads: All kinds (wheat, rye, raisin, white, oatmeal, Italian, French, and English muffin bread). Low-fat rolls: English muffins, frankfurter and hamburger buns, bagels, pita bread, tortillas (not fried). Pancakes, waffles, biscuits, and muffins made with recommended oil.  Avoid: Products made with  saturated or trans fats, oils, or whole milk products. Butter rolls, cheese breads, croissants. Commercial doughnuts, muffins, sweet rolls, biscuits, waffles, pancakes, store-bought mixes. Crackers  Allowed: Low-fat crackers and snacks: Animal, graham, rye, saltine (with recommended oil, no lard), oyster, and matzo crackers. Bread sticks, melba toast, rusks, flatbread, pretzels, and light popcorn.  Avoid: High-fat crackers: cheese crackers, butter crackers, and those made with coconut, palm oil, or trans fat (hydrogenated oils). Buttered popcorn. Cereals  Allowed: Hot or cold whole-grain cereals.  Avoid: Cereals containing coconut, hydrogenated vegetable fat, or animal fat. Potatoes / Pasta / Rice  Allowed: All kinds of potatoes, rice, and pasta (such as macaroni, spaghetti, and noodles).  Avoid: Pasta or rice prepared with cream sauce or high-fat cheese. Chow mein noodles, French fries. Vegetables  Allowed: All vegetables and vegetable juices.  Avoid: Fried vegetables. Vegetables in cream, butter, or high-fat cheese sauces. Limit coconut. Fruit in cream or custard. Protein  Allowed: Limit your intake of meat, seafood, and poultry to no more than 6 oz (cooked weight) per day. All lean, well-trimmed beef, veal, pork, and lamb. All chicken and turkey without skin. All fish and shellfish. Wild game: wild duck, rabbit, pheasant, and venison. Egg whites or low-cholesterol egg substitutes may be used as desired. Meatless dishes: recipes with dried beans, peas, lentils, and tofu (soybean curd). Seeds and nuts: all seeds and most nuts.  Avoid: Prime grade and other heavily marbled and fatty meats, such as short ribs, spare ribs, rib eye roast or steak, frankfurters, sausage, bacon, and high-fat luncheon meats, mutton. Caviar. Commercially fried fish. Domestic duck, goose, venison sausage. Organ meats: liver, gizzard, heart, chitterlings, brains, kidney, sweetbreads. Dairy  Allowed: Low-fat  cheeses: nonfat or low-fat cottage cheese (1% or 2% fat), cheeses made with part skim milk, such as mozzarella, farmers, string, or ricotta. (Cheeses should be labeled no more than 2 to 6 grams fat per oz.). Skim (or 1%) milk: liquid, powdered, or evaporated. Buttermilk made with low-fat milk. Drinks made with skim or low-fat milk or cocoa. Chocolate milk or cocoa made with skim or low-fat (1%) milk. Nonfat or low-fat yogurt.  Avoid: Whole milk cheeses, including colby, cheddar, muenster, Monterey Jack, Havarti, Brie, Camembert, American, Swiss, and blue. Creamed cottage cheese, cream cheese. Whole milk and whole milk products, including buttermilk or yogurt made from whole milk, drinks made from whole milk. Condensed milk, evaporated whole milk, and 2% milk. Soups and Combination Foods  Allowed: Low-fat low-sodium soups: broth, dehydrated soups, homemade broth, soups with the fat removed, homemade cream soups made with skim or low-fat milk. Low-fat spaghetti, lasagna, chili, and Spanish rice if low-fat ingredients and low-fat cooking techniques are used.  Avoid: Cream soups made with whole milk, cream, or high-fat cheese. All other soups. Desserts and Sweets  Allowed: Sherbet, fruit ices, gelatins, meringues, and angel food cake. Homemade desserts with recommended fats, oils, and milk products. Jam, jelly, honey, marmalade, sugars, and syrups. Pure sugar candy, such as gum drops, hard candy, jelly beans, marshmallows, mints, and small amounts of dark chocolate.  Avoid: Commercially prepared   cakes, pies, cookies, frosting, pudding, or mixes for these products. Desserts containing whole milk products, chocolate, coconut, lard, palm oil, or palm kernel oil. Ice cream or ice cream drinks. Candy that contains chocolate, coconut, butter, hydrogenated fat, or unknown ingredients. Buttered syrups. Fats and Oils  Allowed: Vegetable oils: safflower, sunflower, corn, soybean, cottonseed, sesame, canola, olive,  or peanut. Non-hydrogenated margarines. Salad dressing or mayonnaise: homemade or commercial, made with a recommended oil. Low or nonfat salad dressing or mayonnaise.  Limit added fats and oils to 6 to 8 tsp per day (includes fats used in cooking, baking, salads, and spreads on bread). Remember to count the "hidden fats" in foods.  Avoid: Solid fats and shortenings: butter, lard, salt pork, bacon drippings. Gravy containing meat fat, shortening, or suet. Cocoa butter, coconut. Coconut oil, palm oil, palm kernel oil, or hydrogenated oils: these ingredients are often used in bakery products, nondairy creamers, whipped toppings, candy, and commercially fried foods. Read labels carefully. Salad dressings made of unknown oils, sour cream, or cheese, such as blue cheese and Roquefort. Cream, all kinds: half-and-half, light, heavy, or whipping. Sour cream or cream cheese (even if "light" or low-fat). Nondairy cream substitutes: coffee creamers and sour cream substitutes made with palm, palm kernel, hydrogenated oils, or coconut oil. Beverages  Allowed: Coffee (regular or decaffeinated), tea. Diet carbonated beverages, mineral water. Alcohol: Check with your caregiver. Moderation is recommended.  Avoid: Whole milk, regular sodas, and juice drinks with added sugar. Condiments  Allowed: All seasonings and condiments. Cocoa powder. "Cream" sauces made with recommended ingredients.  Avoid: Carob powder made with hydrogenated fats. SAMPLE MENU Breakfast   cup orange juice   cup oatmeal  1 slice toast  1 tsp margarine  1 cup skim milk Lunch  Turkey sandwich with 2 oz turkey, 2 slices bread  Lettuce and tomato slices  Fresh fruit  Carrot sticks  Coffee or tea Snack  Fresh fruit or low-fat crackers Dinner  3 oz lean ground beef  1 baked potato  1 tsp margarine   cup asparagus  Lettuce salad  1 tbs non-creamy dressing   cup peach slices  1 cup skim milk Document Released:  10/07/2007 Document Revised: 06/29/2011 Document Reviewed: 02/27/2013 [employer] Patient Information 2015 [employer]. This information is not intended to replace advice given to you by your health care provider. Make sure you discuss any questions you have with your health care provider.  

## 2013-09-09 NOTE — Discharge Summary (Signed)
, Physician Discharge Summary  Vincent Carter YOV:[id number] DOB: [birth year] DOA: 09/08/2013  PCP: Glo Herring., MD  Admit date: 09/08/2013 Discharge date: 09/09/2013  Time spent: 30 minutes  Recommendations for Outpatient Follow-up:  1. Patient was admitted for chest pain rule out, had stress test which did not reveal reversible ischemia  Discharge Diagnoses:  Active Problems:   Acute chest pain   Vertigo   Bigeminal rhythm   Discharge Condition: Stable  Diet recommendation: Heart healthy  Filed Weights   09/08/13 2248  Weight: 64.456 kg (142 lb 1.6 oz)    History of present illness:  Vincent Carter is a 68 y.o. male  With a history of COPD, hypertension, coronary artery disease status post coronary artery bypass graft with a revision and status post MI. He presents to the hospital with chest pain that started earlier today. Chest pain was located in the left chest and accompanied with diaphoresis, lightheadedness, dizziness, nausea. He had been riding his motorcycle with his family in [place] of his son's death. He arrived home when his symptoms started. His family brought him to the hospital to be evaluated. He received aspirin in the emergency room. In questioning the patient, he been having chest pain off and on over the past week or 2 is located in the left chest and lasts for 2-3 minutes at a time. This pain usually occurs when lying down at night.  He also complained of vertigo, which occurred at similar time as the onset of his chest pain. He was recently treated for an otitis media with Augmentin. Denies hearing loss, slurred speech, weakness, sensation changes. Vertigo worse with movement of head. Denies head trauma. The patient received meclizine in the ER, which resolved his vertigo.   Hospital Course:  Patient is a pleasant 68 year old gentleman with an established history of coronary artery disease status post coronary artery bypass grafting, who was  admitted to the medicine service on 09/08/2013. Presented with complaints of chest pain located in the left side of his chest associated with dizziness, lightheadedness and diaphoresis. He was placed on telemetry with cycling of troponins. Serial troponins remained negative overnight. Cardiology was consulted as patient was set up for stress test. This study did not reveal reversible ischemia however there was a moderately large remote infarct/scarring involving the inferior wall at the mid ventricle and apex. Patient did not have further episodes of chest pain. Symptoms could be related to gastroesophageal reflux disease or have a  Musculoskeletal origin. He also reported having several days of diarrhea last week which could have led to dehydration.   Procedures:  Stress test performed on 09/09/2013 IMPRESSION:  1. No reversible ischemia. Moderately large a remote  infarct/scarring involving the inferior wall at the mid ventricle  and apex, this true ventricular apex an the anteroapical wall.  2. Hypokinesis of the inferior wall in the region of prior  infarct/scarring.  3. Left ventricular ejection fraction 49%   Consultations:  Cardiology  Discharge Exam: Filed Vitals:   09/09/13 1100  BP: 159/87  Pulse:   Temp:   Resp:     General: Patient is in no acute distress awake, alert, oriented Cardiovascular: Regular rate and rhythm normal S1-S2 Respiratory: Clear to auscultation bilaterally no wheezing rhonchi or rales Abdomen: Soft nontender nondistended  Discharge Instructions You were cared for by a hospitalist during your hospital stay. If you have any questions about your discharge medications or the care you received while you were in the hospital after you  are discharged, you can call the unit and asked to speak with the hospitalist on call if the hospitalist that took care of you is not available. Once you are discharged, your primary care physician will handle any further  medical issues. Please note that NO REFILLS for any discharge medications will be authorized once you are discharged, as it is imperative that you return to your primary care physician (or establish a relationship with a primary care physician if you do not have one) for your aftercare needs so that they can reassess your need for medications and monitor your lab values.  Discharge Instructions   Call MD for:  difficulty breathing, headache or visual disturbances    Complete by:  As directed      Call MD for:  extreme fatigue    Complete by:  As directed      Call MD for:  persistant dizziness or light-headedness    Complete by:  As directed      Call MD for:  persistant nausea and vomiting    Complete by:  As directed      Call MD for:  redness, tenderness, or signs of infection (pain, swelling, redness, odor or green/yellow discharge around incision site)    Complete by:  As directed      Call MD for:  severe uncontrolled pain    Complete by:  As directed      Call MD for:  temperature >100.4    Complete by:  As directed      Diet - low sodium heart healthy    Complete by:  As directed      Increase activity slowly    Complete by:  As directed             Medication List    STOP taking these medications       amoxicillin-clavulanate 875-125 MG per tablet  Commonly known as:  AUGMENTIN      TAKE these medications       aspirin EC 81 MG tablet  Take 81 mg by mouth 2 (two) times daily.     carvedilol 3.125 MG tablet  Commonly known as:  COREG  Take 1 tablet (3.125 mg total) by mouth 2 (two) times daily.     Fish Oil 300 MG Caps  Take 2 capsules by mouth 2 (two) times daily.     nitroGLYCERIN 0.4 MG SL tablet  Commonly known as:  NITROSTAT  Place 1 tablet (0.4 mg total) under the tongue every 5 (five) minutes as needed for chest pain.       Allergies  Allergen Reactions  . Codeine Nausea Only      The results of significant diagnostics from this hospitalization  (including imaging, microbiology, ancillary and laboratory) are listed below for reference.    Significant Diagnostic Studies: Dg Chest 2 View  09/08/2013   CLINICAL DATA:  68 year old with chest pain.  EXAM: CHEST  2 VIEW  COMPARISON:  02/21/2012 and prior chest radiographs dating back to 07/27/2004  FINDINGS: Cardiomegaly and CABG changes again identified.  Mild peribronchial thickening is unchanged.  There is no evidence of focal airspace disease, pulmonary edema, suspicious pulmonary nodule/mass, pleural effusion, or pneumothorax. No acute bony abnormalities are identified.  IMPRESSION: Cardiomegaly without evidence of acute cardiopulmonary disease.   Electronically Signed   By: Hassan Rowan M.D.   On: 09/08/2013 18:44   Ct Head Wo Contrast  09/08/2013   CLINICAL DATA:  68 year old male with weakness.  EXAM: CT HEAD WITHOUT CONTRAST  TECHNIQUE: Contiguous axial images were obtained from the base of the skull through the vertex without intravenous contrast.  COMPARISON:  None.  FINDINGS: Mild periventricular white matter hypodensities are nonspecific but may be related to chronic small-vessel white matter ischemic changes.  A remote left basal ganglia lacunar infarct is noted.  No acute intracranial abnormalities are identified, including mass lesion or mass effect, hydrocephalus, extra-axial fluid collection, midline shift, hemorrhage, or acute infarction.  The visualized bony calvarium is unremarkable.  IMPRESSION: No evidence of acute intracranial abnormality.  Mild nonspecific periventricular white matter hypodensities - probably related to chronic small-vessel white matter ischemic changes.  Remote left basal ganglia lacunar infarct.   Electronically Signed   By: Hassan Rowan M.D.   On: 09/08/2013 18:51   Nm Myocar Multi W/spect W/wall Motion / Ef  09/09/2013   CLINICAL DATA:  69 year old male with chest pain  EXAM: MYOCARDIAL IMAGING WITH SPECT (REST AND PHARMACOLOGIC-STRESS)  GATED LEFT VENTRICULAR WALL  MOTION STUDY  LEFT VENTRICULAR EJECTION FRACTION  TECHNIQUE: Standard myocardial SPECT imaging was performed after resting intravenous injection of 10 mCi Tc-68m sestamibi. Subsequently, intravenous infusion of Lexiscan was performed under the supervision of the Cardiology staff. At peak effect of the drug, 30 mCi Tc-17m sestamibi was injected intravenously and standard myocardial SPECT imaging was performed. Quantitative gated imaging was also performed to evaluate left ventricular wall motion, and estimate left ventricular ejection fraction.  COMPARISON:  Prior chest x-ray 09/08/2013  FINDINGS: Perfusion: Moderately large fixed defect noted in the inferior wall extending from the mid ventricle to the apex. The defect also involves the true ventricular apex as well as the anteroapical wall. There is associated hypokinesis of the inferior wall. No decreased activity in the left ventricle only on stress imaging to suggest reversible ischemia or infarction.  Wall Motion: No left ventricular dilatation. Hypokinesis involving the inferior wall and apex.  Left Ventricular Ejection Fraction: 49 %  End diastolic volume 710 ml  End systolic volume 57 ml  IMPRESSION: 1. No reversible ischemia. Moderately large a remote infarct/scarring involving the inferior wall at the mid ventricle and apex, this true ventricular apex an the anteroapical wall.  2. Hypokinesis of the inferior wall in the region of prior infarct/scarring.  3. Left ventricular ejection fraction 49%  4. Intermediate-risk stress test findings*.  *2012 Appropriate Use Criteria for Coronary Revascularization Focused Update: J Am Coll Cardiol. 6269;48(5):462-703. http://[email or web address]   Electronically Signed   By: Jacqulynn Cadet M.D.   On: 09/09/2013 12:58    Microbiology: No results found for this or any previous visit (from the past 240 hour(s)).   Labs: Basic Metabolic Panel:  Recent Labs Lab 09/08/13 1750  09/09/13 0510  NA 143 143  K 3.7 3.8  CL 105 111  CO2 25 22  GLUCOSE 88 103*  BUN 21 20  CREATININE 1.05 0.98  CALCIUM 9.4 8.0*   Liver Function Tests: No results found for this basename: AST, ALT, ALKPHOS, BILITOT, PROT, ALBUMIN,  in the last 168 hours No results found for this basename: LIPASE, AMYLASE,  in the last 168 hours No results found for this basename: AMMONIA,  in the last 168 hours CBC:  Recent Labs Lab 09/08/13 1750 09/09/13 0510  WBC 8.6 9.4  NEUTROABS 5.6  --   HGB 17.8* 15.5  HCT 50.9 45.3  MCV 82.5 82.4  PLT 168 174   Cardiac Enzymes:  Recent Labs Lab 09/08/13 1750 09/08/13 2355 09/09/13 0510 09/09/13  [place] <0.30 <0.30 <0.30 <0.30   BNP: BNP (last 3 results) No results found for this basename: PROBNP,  in the last 8760 hours CBG: No results found for this basename: GLUCAP,  in the last 168 hours     Signed:  Kelvin Cellar  [facility] 09/09/2013, 2:14 PM

## 2013-09-09 NOTE — Progress Notes (Signed)
Treadmill stress test terminated early as patient could not reach target heart rate of 128. He could not follow the fast pace during the 3rd stage of Bruce protocol. Test converted to lexiscan stress test  Patient completed the lexiscan stress test without complication. No CP, some flushing and headache resolved afterward. Pending result  Signed, Almyra Deforest PA Pager: [phone]

## 2013-09-14 ENCOUNTER — Ambulatory Visit (HOSPITAL_COMMUNITY): Admission: RE | Admit: 2013-09-14 | Payer: Medicare Other | Source: Ambulatory Visit

## 2014-02-08 ENCOUNTER — Encounter (HOSPITAL_COMMUNITY): Payer: Self-pay

## 2014-02-08 ENCOUNTER — Ambulatory Visit (HOSPITAL_COMMUNITY)
Admission: RE | Admit: 2014-02-08 | Discharge: 2014-02-08 | Disposition: A | Discharge status: Home / Self Care | Payer: Medicare Other | Source: Ambulatory Visit | Attending: Urology | Admitting: Urology

## 2014-02-08 DIAGNOSIS — C61 Malignant neoplasm of prostate: Secondary | ICD-10-CM | POA: Diagnosis not present

## 2014-02-08 DIAGNOSIS — R972 Elevated prostate specific antigen [PSA]: Secondary | ICD-10-CM | POA: Insufficient documentation

## 2014-02-08 MED ORDER — LIDOCAINE HCL (PF) 1 % IJ SOLN
INTRAMUSCULAR | Status: AC
  Administered 2014-02-08: 5 mL
  Filled 2014-02-08: qty 5

## 2014-02-08 MED ORDER — LIDOCAINE HCL (PF) 2 % IJ SOLN
INTRAMUSCULAR | Status: AC
  Administered 2014-02-08: 10 mL
  Filled 2014-02-08: qty 10

## 2014-02-08 MED ORDER — CEFTRIAXONE SODIUM 1 G IJ SOLR
INTRAMUSCULAR | Status: AC
  Administered 2014-02-08: 1000 mg via INTRAMUSCULAR
  Filled 2014-02-08: qty 10

## 2014-02-08 NOTE — Discharge Instructions (Signed)
Transrectal Ultrasound-Guided Biopsy °A transrectal ultrasound-guided biopsy is a procedure to remove samples of tissue from your prostate using ultrasound images to guide the procedure. The procedure is usually done to evaluate the prostate gland of men who have an elevated prostate-specific antigen (PSA). PSA is a blood test to screen for prostate cancer. The biopsy samples are taken to check for prostate cancer.  °LET YOUR HEALTH CARE PROVIDER KNOW ABOUT: °· Any allergies you have. °· All medicines you are taking, including vitamins, herbs, eye drops, creams, and over-the-counter medicines. °· Previous problems you or members of your family have had with the use of anesthetics. °· Any blood disorders you have. °· Previous surgeries you have had. °· Medical conditions you have. °RISKS AND COMPLICATIONS °Generally, this is a safe procedure. However, as with any procedure, problems can occur. Possible problems include: °· Infection of your prostate. °· Bleeding from your rectum or blood in your urine. °· Difficulty urinating. °· Nerve damage (this is usually temporary). °· Damage to surrounding structures such as blood vessels, organs, and muscles, which would require other procedures. °BEFORE THE PROCEDURE °· Do not eat or drink anything after midnight on the night before the procedure or as directed by your health care provider. °· Take medicines only as directed by your health care provider. °· Your health care provider may have you stop taking certain medicines 5-7 days before the procedure. °· You will be given an enema before the procedure. During an enema, a liquid is injected into your rectum to clear out waste. °· You may have lab tests the day of your procedure.   °· Plan to have someone take you home after the procedure. °PROCEDURE  °· You will be given medicine to help you relax (sedative) before the procedure. An IV tube will be inserted into one of your veins and used to give fluids and  medicine. °· You will be given antibiotic medicine to reduce the risk of an infection. °· You will be placed on your side for the procedure. °· A probe with lubricated gel will be placed into your rectum, and images will be taken of your prostate and surrounding structures. °· Numbing medicine will be injected into the prostate before the biopsy samples are taken. °· A biopsy needle will then be inserted and guided to your prostate with the use of the ultrasound images. °· Samples of prostate tissue will be taken, and the needle will then be removed. °· The biopsy samples will be sent to a lab to be analyzed. Results are usually back in 2-3 days. °AFTER THE PROCEDURE °· You will be taken to a recovery area where you will be monitored. °· You may have some discomfort in the rectal area. You will be given pain medicines to control this. °· You may be allowed to go home the same day, or you may need to stay in the hospital overnight. °Document Released: 05/14/2013 Document Reviewed: 08/16/2012 °Exit[employer]® Patient Information ©2015 [employer]. This information is not intended to replace advice given to you by your health care provider. Make sure you discuss any questions you have with your health care provider. ° °

## 2014-02-21 DIAGNOSIS — C61 Malignant neoplasm of prostate: Secondary | ICD-10-CM | POA: Diagnosis not present

## 2014-02-21 DIAGNOSIS — N5201 Erectile dysfunction due to arterial insufficiency: Secondary | ICD-10-CM | POA: Diagnosis not present

## 2014-03-05 ENCOUNTER — Encounter: Payer: Self-pay | Admitting: Radiation Oncology

## 2014-03-05 DIAGNOSIS — C61 Malignant neoplasm of prostate: Secondary | ICD-10-CM | POA: Insufficient documentation

## 2014-03-05 NOTE — Progress Notes (Signed)
GU Location of Tumor / Histology: prostatic adenocarcinoma  If Prostate Cancer, Gleason Score is (3 + 4) and PSA is (4.71)  Vincent Carter was referred to [name] by [name] for a rising PSA on 06/12/2013.  Biopsies of prostate (if applicable) revealed:    Past/Anticipated interventions by urology, if any: biopsy, discussed options, referred to Dr. Tammi Klippel  Past/Anticipated interventions by medical oncology, if any: no  Weight changes, if any: no  Bowel/Bladder complaints, if any: nocturia x 1; denies dysuria or hematuria.  Nausea/Vomiting, if any: no  Pain issues, if any:  no  SAFETY ISSUES:  Prior radiation? no  Pacemaker/ICD? no  Possible current pregnancy? no  Is the patient on methotrexate? no  Current Complaints / other details:  69 year old male. Widowed.

## 2014-03-05 NOTE — Progress Notes (Signed)
Radiation Oncology         [phone]) [phone] ________________________________  Initial outpatient Consultation  Name: Vincent Carter MRN: [id number]  Date: 03/06/2014  DOB: [birth year]  TK:WIOXB,DZHGDJME J., MD  [name], MD   REFERRING PHYSICIAN: [name], MD  DIAGNOSIS: 68 y.o. gentleman with stage T1c adenocarcinoma of the prostate with a Gleason's score of 3+4 and a PSA of 4.71    ICD-9-CM ICD-10-CM   1. Malignant neoplasm of prostate [name] to [place] L Stelly is a 69 y.o. gentleman.  He was noted to have an elevated PSA of 4.71 by his primary care physician, [name].  Accordingly, he was referred for evaluation in urology by [name] on 07/27/13,  digital rectal examination was performed at that time revealing no nodules.  The patient proceeded to transrectal ultrasound with 12 biopsies of the prostate on 02/08/14.  The prostate volume measured 38 cc.  Out of 12 core biopsies, 4 were positive.  The maximum Gleason score was 3+4, and this was seen in the right lateral base.    The patient reviewed the biopsy results with his urologist and he has kindly been referred today for discussion of potential radiation treatment options.  PREVIOUS RADIATION THERAPY: No  PAST MEDICAL HISTORY:  has a past medical history of ASCVD (arteriosclerotic cardiovascular disease); Tobacco abuse, in remission; Chronic obstructive pulmonary disease; Hypertension; Ptosis; Low TSH level; Colonic polyp (2011); Diverticulosis; Peripheral vascular disease; and Prostate cancer.    PAST SURGICAL HISTORY: Past Surgical History  Procedure Laterality Date  . Appendectomy    . Orif tibia & fibula fractures      Right  . Eye surgery      Left; secondary to injury  . Coronary artery bypass graft  [id number]  . Colonoscopy w/ polypectomy  2011  . Prostate biopsy      FAMILY HISTORY: family history includes Cancer in his  brother; Clotting disorder in his mother; Heart attack in his mother; Lung cancer in his father.  SOCIAL HISTORY:  reports that he has been smoking Cigarettes.  He has a 50 pack-year smoking history. He has never used smokeless tobacco. He reports that he does not drink alcohol or use illicit drugs.  ALLERGIES: Codeine  MEDICATIONS:  Current Outpatient Prescriptions  Medication Sig Dispense Refill  . aspirin EC 81 MG tablet Take 81 mg by mouth 2 (two) times daily.    . carvedilol (COREG) 3.125 MG tablet Take 1 tablet (3.125 mg total) by mouth 2 (two) times daily. 180 tablet 3  . fluticasone (FLONASE) 50 MCG/ACT nasal spray Place into both nostrils daily.    . nitroGLYCERIN (NITROSTAT) 0.4 MG SL tablet Place 1 tablet (0.4 mg total) under the tongue every 5 (five) minutes as needed for chest pain. 25 tablet 6  . Omega-3 Fatty Acids (FISH OIL) 300 MG CAPS Take 2 capsules by mouth 2 (two) times daily.       No current facility-administered medications for this encounter.    REVIEW OF SYSTEMS:  A 15 point review of systems is documented in the electronic medical record. This was obtained by the nursing staff. However, I reviewed this with the patient to discuss relevant findings and make appropriate changes.  A comprehensive review of systems was negative..  The patient completed an IPSS and IIEF questionnaire.  His IPSS score was 4 indicating mild urinary outflow obstructive symptoms.  He indicated that his  erectile function has had no exposure.   PHYSICAL EXAM: This patient is in no acute distress.  He is alert and oriented.   height is 5' (1.524 m) and weight is 153 lb (69.4 kg). His oral temperature is 98 F (36.7 C). His blood pressure is 167/87 and his pulse is 63. His respiration is 16 and oxygen saturation is 100%.  He exhibits no respiratory distress or labored breathing.  He appears neurologically intact.  His mood is pleasant.  His affect is appropriate.  Please note the digital rectal  exam findings described above.  KPS = 100  100 - Normal; no complaints; no evidence of disease. 90   - Able to carry on normal activity; minor signs or symptoms of disease. 80   - Normal activity with effort; some signs or symptoms of disease. 29   - Cares for self; unable to carry on normal activity or to do active work. 60   - Requires occasional assistance, but is able to care for most of his personal needs. 50   - Requires considerable assistance and frequent medical care. 17   - Disabled; requires special care and assistance. 51   - Severely disabled; hospital admission is indicated although death not imminent. 49   - Very sick; hospital admission necessary; active supportive treatment necessary. 10   - Moribund; fatal processes progressing rapidly. 0     - Dead  Karnofsky DA, Abelmann [place], Craver LS and Burchenal [facility] [phone]) The use of the nitrogen mustards in the palliative treatment of carcinoma: with particular reference to bronchogenic carcinoma Cancer 1 634-56   LABORATORY DATA:  Lab Results  Component Value Date   WBC 9.4 09/09/2013   HGB 15.5 09/09/2013   HCT 45.3 09/09/2013   MCV 82.4 09/09/2013   PLT 174 09/09/2013   Lab Results  Component Value Date   NA 143 09/09/2013   K 3.8 09/09/2013   CL 111 09/09/2013   CO2 22 09/09/2013   Lab Results  Component Value Date   ALT 9 02/22/2013   AST 9 02/22/2013   ALKPHOS 77 02/22/2013   BILITOT 1.1 02/22/2013     RADIOGRAPHY: No results found.    IMPRESSION: This gentleman is a 69 y.o. gentleman with stage T1c adenocarcinoma of the prostate with a Gleason's score of 3+4 and a PSA of 4.71.  His T-Stage, Gleason's Score, and PSA put him into the intermediate risk group.  Accordingly he is eligible for a variety of potential treatment options including radical prostatectomy, external beam radiotherapy, or prostate seed implant.  PLAN:Today I reviewed the findings and workup thus far.  We discussed the natural history of  prostate cancer.  We reviewed the the implications of T-stage, Gleason's Score, and PSA on decision-making and outcomes in prostate cancer.  We discussed radiation treatment in the management of prostate cancer with regard to the logistics and delivery of external beam radiation treatment as well as the logistics and delivery of prostate brachytherapy.  We compared and contrasted each of these approaches and also compared these against prostatectomy.  The patient expressed interest in prostate brachytherapy.  I filled out a patient counseling form for him with relevant treatment diagrams and we retained a copy for our records.   The patient would like to proceed with prostate brachytherapy.  I will share my findings with [name] and move forward with scheduling the procedure in the near future.     I enjoyed meeting with him today, and will  look forward to participating in the care of this very nice gentleman.   I spent 60 minutes face to face with the patient and more than 50% of that time was spent in counseling and/or coordination of care.   ------------------------------------------------  Sheral Apley. Tammi Klippel, M.D.

## 2014-03-06 ENCOUNTER — Ambulatory Visit
Admission: RE | Admit: 2014-03-06 | Discharge: 2014-03-06 | Disposition: A | Discharge status: Home / Self Care | Payer: Medicare Other | Source: Ambulatory Visit | Attending: Radiation Oncology | Admitting: Radiation Oncology

## 2014-03-06 ENCOUNTER — Encounter: Payer: Self-pay | Admitting: Radiation Oncology

## 2014-03-06 VITALS — BP 167/87 | HR 63 | Temp 98.0°F | Resp 16 | Ht 60.0 in | Wt 153.0 lb

## 2014-03-06 DIAGNOSIS — C61 Malignant neoplasm of prostate: Secondary | ICD-10-CM | POA: Insufficient documentation

## 2014-03-06 DIAGNOSIS — J449 Chronic obstructive pulmonary disease, unspecified: Secondary | ICD-10-CM | POA: Diagnosis not present

## 2014-03-06 DIAGNOSIS — Z7982 Long term (current) use of aspirin: Secondary | ICD-10-CM | POA: Diagnosis not present

## 2014-03-06 DIAGNOSIS — F17210 Nicotine dependence, cigarettes, uncomplicated: Secondary | ICD-10-CM | POA: Insufficient documentation

## 2014-03-06 DIAGNOSIS — I10 Essential (primary) hypertension: Secondary | ICD-10-CM | POA: Insufficient documentation

## 2014-03-06 DIAGNOSIS — Z7951 Long term (current) use of inhaled steroids: Secondary | ICD-10-CM | POA: Diagnosis not present

## 2014-03-06 DIAGNOSIS — Z510 Encounter for antineoplastic radiation therapy: Secondary | ICD-10-CM | POA: Insufficient documentation

## 2014-03-06 DIAGNOSIS — I739 Peripheral vascular disease, unspecified: Secondary | ICD-10-CM | POA: Insufficient documentation

## 2014-03-06 DIAGNOSIS — I2510 Atherosclerotic heart disease of native coronary artery without angina pectoris: Secondary | ICD-10-CM | POA: Insufficient documentation

## 2014-03-06 NOTE — Progress Notes (Signed)
See progress note under physician encounter. 

## 2014-03-06 NOTE — Addendum Note (Signed)
Encounter addended by: Lora Paula, MD on: 03/06/2014  4:16 PM<BR>     Documentation filed: Clinical Notes

## 2014-03-06 NOTE — Progress Notes (Signed)
Patient denies dysuria. Reports hematuria resolved approximately one week following biopsy. Reports bowel patterns alternate from constipation to diarrhea related to effects of diverticulitis. Reports an occasional delay in the flow of urine but, "once it starts it strong and steady." Denies difficulty emptying his bladder, urgency or frequency. Reports since his son died on year ago he hasn't slept but, an hour or so each night. Reports nocturia x2. Reports night sweats. Denies weight loss. Denies pain. BP slightly elevated.IPSS 4. SHIM "no exposure."

## 2014-03-07 ENCOUNTER — Telehealth: Payer: Self-pay | Admitting: *Deleted

## 2014-03-07 NOTE — Telephone Encounter (Signed)
CALLED PATIENT TO INFORM OF PRE-SEED APPT., SPOKE WITH PATIENT'S DAUGHTER IN-LAW (LISA) AND SHE IS AWARE OF THIS APPT.

## 2014-03-08 ENCOUNTER — Encounter: Payer: Self-pay | Admitting: *Deleted

## 2014-03-08 NOTE — Progress Notes (Signed)
[place] Psychosocial Distress Screening Clinical Social Work  Clinical Social Work was referred by distress screening protocol.  The patient scored a 5 on the Psychosocial Distress Thermometer which indicates moderate distress. Clinical Social Worker attempted to contact patient to assess for distress and other psychosocial needs.   ONCBCN DISTRESS SCREENING 03/06/2014  Screening Type Initial Screening  Distress experienced in past week (1-10) 5  Family Problem type Other (comment)  Emotional problem type Depression  Information Concerns Type Lack of info about treatment  Physical Problem type Constipation/diarrhea  Physician notified of physical symptoms Yes  Referral to clinical psychology No  Referral to clinical social work No  Referral to dietition No  Referral to financial advocate No  Referral to support programs No  Referral to palliative care No    Clinical Social Worker follow up needed: Yes.    If yes, follow up plan:  CSW left voicemail to return call when convenient.  Polo Riley, MSW, LCSW, OSW-C Clinical Social Worker [facility] [phone]

## 2014-03-13 ENCOUNTER — Telehealth: Payer: Self-pay | Admitting: *Deleted

## 2014-03-13 NOTE — Telephone Encounter (Signed)
Called patient to inform of implant date, lvm for a return call 

## 2014-03-15 ENCOUNTER — Telehealth: Payer: Self-pay | Admitting: *Deleted

## 2014-03-15 NOTE — Telephone Encounter (Signed)
Called patient to inform of implant, lvm for a return call

## 2014-03-21 ENCOUNTER — Telehealth: Payer: Self-pay | Admitting: *Deleted

## 2014-03-21 NOTE — Telephone Encounter (Signed)
CALLED PATIENT TO REMIND OF PRE-SEED APPT. FOR 03-22-14 @ 1 PM, SPOKE WITH PATIENT AND HE IS AWARE OF THIS APPT.

## 2014-03-22 ENCOUNTER — Ambulatory Visit
Admission: RE | Admit: 2014-03-22 | Discharge: 2014-03-22 | Disposition: A | Discharge status: Home / Self Care | Payer: Medicare Other | Source: Ambulatory Visit | Attending: Radiation Oncology | Admitting: Radiation Oncology

## 2014-03-22 DIAGNOSIS — J449 Chronic obstructive pulmonary disease, unspecified: Secondary | ICD-10-CM | POA: Diagnosis not present

## 2014-03-22 DIAGNOSIS — Z510 Encounter for antineoplastic radiation therapy: Secondary | ICD-10-CM | POA: Diagnosis not present

## 2014-03-22 DIAGNOSIS — C61 Malignant neoplasm of prostate: Secondary | ICD-10-CM

## 2014-03-22 DIAGNOSIS — I10 Essential (primary) hypertension: Secondary | ICD-10-CM | POA: Diagnosis not present

## 2014-03-22 DIAGNOSIS — Z7982 Long term (current) use of aspirin: Secondary | ICD-10-CM | POA: Diagnosis not present

## 2014-03-22 DIAGNOSIS — F17210 Nicotine dependence, cigarettes, uncomplicated: Secondary | ICD-10-CM | POA: Diagnosis not present

## 2014-03-22 DIAGNOSIS — I739 Peripheral vascular disease, unspecified: Secondary | ICD-10-CM | POA: Diagnosis not present

## 2014-03-22 DIAGNOSIS — Z7951 Long term (current) use of inhaled steroids: Secondary | ICD-10-CM | POA: Diagnosis not present

## 2014-03-22 DIAGNOSIS — I2510 Atherosclerotic heart disease of native coronary artery without angina pectoris: Secondary | ICD-10-CM | POA: Diagnosis not present

## 2014-03-22 NOTE — Progress Notes (Signed)
  Radiation Oncology         [phone] ________________________________  Name: Vincent Carter MRN: [id number]  Date: 03/22/2014  DOB: [birth year]  SIMULATION AND TREATMENT PLANNING NOTE PUBIC ARCH STUDY  UO:HFGBM,SXJDBZMC J., MD  Irine Seal, MD  DIAGNOSIS: 69 y.o. gentleman with stage T1c adenocarcinoma of the prostate with a Gleason's score of 3+4 and a PSA of 4.71     ICD-9-CM ICD-10-CM   1. Malignant neoplasm of prostate 185 C61     COMPLEX SIMULATION:  The patient presented today for evaluation for possible prostate seed implant. He was brought to the radiation planning suite and placed supine on the CT couch. A 3-dimensional image study set was obtained in upload to the planning computer. There, on each axial slice, I contoured the prostate gland. Then, using three-dimensional radiation planning tools I reconstructed the prostate in view of the structures from the transperineal needle pathway to assess for possible pubic arch interference. In doing so, I did not appreciate any pubic arch interference. Also, the patient's prostate volume was estimated based on the drawn structure. The volume was 39 cc.  Given the pubic arch appearance and prostate volume, patient remains a good candidate to proceed with prostate seed implant. Today, he freely provided informed written consent to proceed.    PLAN: The patient will undergo prostate seed implant.   ________________________________  Sheral Apley. Tammi Klippel, M.D.

## 2014-04-24 ENCOUNTER — Telehealth: Payer: Self-pay | Admitting: *Deleted

## 2014-04-24 NOTE — Telephone Encounter (Signed)
CALLED PATIENT TO REMIND OF LAB FOR IMPLANT, SPOKE WITH PATIENT'S DAUGHTER- LISA AND THEY ARE AWARE OF THIS APPT.

## 2014-04-25 DIAGNOSIS — I739 Peripheral vascular disease, unspecified: Secondary | ICD-10-CM | POA: Diagnosis not present

## 2014-04-25 DIAGNOSIS — M1990 Unspecified osteoarthritis, unspecified site: Secondary | ICD-10-CM | POA: Diagnosis not present

## 2014-04-25 DIAGNOSIS — I4891 Unspecified atrial fibrillation: Secondary | ICD-10-CM | POA: Diagnosis not present

## 2014-04-25 DIAGNOSIS — I10 Essential (primary) hypertension: Secondary | ICD-10-CM | POA: Diagnosis not present

## 2014-04-25 DIAGNOSIS — Z955 Presence of coronary angioplasty implant and graft: Secondary | ICD-10-CM | POA: Diagnosis not present

## 2014-04-25 DIAGNOSIS — Z7982 Long term (current) use of aspirin: Secondary | ICD-10-CM | POA: Diagnosis not present

## 2014-04-25 DIAGNOSIS — Z87442 Personal history of urinary calculi: Secondary | ICD-10-CM | POA: Diagnosis not present

## 2014-04-25 DIAGNOSIS — F17210 Nicotine dependence, cigarettes, uncomplicated: Secondary | ICD-10-CM | POA: Diagnosis not present

## 2014-04-25 DIAGNOSIS — I2510 Atherosclerotic heart disease of native coronary artery without angina pectoris: Secondary | ICD-10-CM | POA: Diagnosis not present

## 2014-04-25 DIAGNOSIS — Z96649 Presence of unspecified artificial hip joint: Secondary | ICD-10-CM | POA: Diagnosis not present

## 2014-04-25 DIAGNOSIS — C61 Malignant neoplasm of prostate: Secondary | ICD-10-CM | POA: Diagnosis not present

## 2014-04-25 DIAGNOSIS — I252 Old myocardial infarction: Secondary | ICD-10-CM | POA: Diagnosis not present

## 2014-04-25 DIAGNOSIS — J449 Chronic obstructive pulmonary disease, unspecified: Secondary | ICD-10-CM | POA: Diagnosis not present

## 2014-04-25 DIAGNOSIS — R972 Elevated prostate specific antigen [PSA]: Secondary | ICD-10-CM | POA: Diagnosis not present

## 2014-04-25 DIAGNOSIS — Z886 Allergy status to analgesic agent status: Secondary | ICD-10-CM | POA: Diagnosis not present

## 2014-04-25 LAB — COMPREHENSIVE METABOLIC PANEL
ALBUMIN: 4 g/dL (ref 3.5–5.2)
ALK PHOS: 74 U/L (ref 39–117)
ALT: 14 U/L (ref 0–53)
ANION GAP: 3 — AB (ref 5–15)
AST: 12 U/L (ref 0–37)
BUN: 17 mg/dL (ref 6–23)
CO2: 26 mmol/L (ref 19–32)
CREATININE: 1 mg/dL (ref 0.50–1.35)
Calcium: 8.8 mg/dL (ref 8.4–10.5)
Chloride: 108 mmol/L (ref 96–112)
GFR calc Af Amer: 86 mL/min — ABNORMAL LOW (ref >90)
GFR calc non Af Amer: 74 mL/min — ABNORMAL LOW (ref >90)
Glucose, Bld: 100 mg/dL — ABNORMAL HIGH (ref 70–99)
Potassium: 3.9 mmol/L (ref 3.5–5.1)
SODIUM: 137 mmol/L (ref 135–145)
Total Bilirubin: 1.1 mg/dL (ref 0.3–1.2)
Total Protein: 6.5 g/dL (ref 6.0–8.3)

## 2014-04-25 LAB — PROTIME-INR
INR: 1.01 (ref 0.00–1.49)
PROTHROMBIN TIME: 13.4 s (ref 11.6–15.2)

## 2014-04-25 LAB — CBC
HCT: 49.9 % (ref 39.0–52.0)
HEMOGLOBIN: 16.6 g/dL (ref 13.0–17.0)
MCH: 28.3 pg (ref 26.0–34.0)
MCHC: 33.3 g/dL (ref 30.0–36.0)
MCV: 85.2 fL (ref 78.0–100.0)
Platelets: 167 10*3/uL (ref 150–400)
RBC: 5.86 MIL/uL — ABNORMAL HIGH (ref 4.22–5.81)
RDW: 14.5 % (ref 11.5–15.5)
WBC: 7.7 10*3/uL (ref 4.0–10.5)

## 2014-04-25 LAB — APTT: aPTT: 39 seconds — ABNORMAL HIGH (ref 24–37)

## 2014-04-29 ENCOUNTER — Encounter (HOSPITAL_BASED_OUTPATIENT_CLINIC_OR_DEPARTMENT_OTHER): Payer: Self-pay | Admitting: *Deleted

## 2014-04-30 ENCOUNTER — Encounter (HOSPITAL_BASED_OUTPATIENT_CLINIC_OR_DEPARTMENT_OTHER): Payer: Self-pay | Admitting: *Deleted

## 2014-04-30 NOTE — Progress Notes (Addendum)
SPOKE W/ DAUGHTER, PT HOH OVER THERE PHONE, WITH PT BY HER SIDE. NPO AFTER MN. ARRIVE AT 0600. CURRENT LAB RESULTS, EKG, AND CXR IN CHART AND EPIC. WILL TAKE COREG AM DOS W/ SIPS OF WATER AND DO FLEET ENEMA.  REVIEWED CHART W/ DR CARIGNAN MDA , OK TO PROCEED.

## 2014-04-30 NOTE — H&P (Signed)
Active Problems Problems  1. Benign prostate hyperplasia (N40.0) 2. Inguinal hernia, left (K40.90) 3. Prostate cancer (C61) 4. PSA elevation (R97.2)  History of Present Illness Vincent Carter returns today in f/u from his recent prostate biopsy that was done for an elevated PSA.  He was found to have T1c Nx Mx Gleason 7 prostate cancer. His CAPRA score is 2 and his IPSS is 2. He had Gleason 7(3+4) in 40% of the left base lateral core and Gleason 6(3+3) in 5% of the left mid lateral core, 90% in the left base lateral core and 5% in the left mid lateral core. His prostate volume is 12ml and the PSA was 4.71 prior to his visit in July.   Past Medical History Problems  1. History of atrial fibrillation (Z86.79) 2. History of cardiac arrhythmia (Z86.79) 3. History of cardiac disorder (Z86.79) 4. History of glaucoma (Z86.69) 5. History of hypertension (Z86.79) 6. History of myocardial infarction (I25.2) 7. History of renal calculi (X41.287)  Surgical History Problems  1. History of Appendectomy 2. History of CABG (CABG) 3. History of Cath Stent Placement 4. History of Eye Surgery 5. History of Leg Repair 6. History of Tonsillectomy 7. History of Total Hip Replacement  Current Meds 1. Aspirin 81 MG Oral Tablet;  Therapy: (Recorded:17Jul2015) to Recorded 2. Coreg TABS;  Therapy: (Recorded:17Jul2015) to Recorded 3. Fish Oil CAPS;  Therapy: (Recorded:17Jul2015) to Recorded 4. Levofloxacin 500 MG Oral Tablet; Take one tablet daily starting day before procedure;  Therapy: [id number] to (Evaluate:20Jul2015)  Requested for: [id number]; Last  Rx:17Jul2015 Ordered  Allergies Medication  1. Codeine Derivatives  Family History Problems  1. Family history of Death : Mother, Father 2. Family history of Emphysema lung : Father 3. Family history of malignant neoplasm of prostate (Q94.76) : Brother 4. Family history of stroke (Z82.3) : Mother 5. History of urostomy : Mother  Social  History Problems  1. Caffeine use (F15.90)   4 p/d 2. Number of children   1 son 1 son deceased 3. Retired 4. Smokes cigarettes (F17.210)   1 ppd for 52yrs. 5. Widowed  Results/Data  Path report reviewed.  IPSS: The IPSS today is 2     Assessment Assessed  1. Prostate cancer (C61) 2. 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  He is not sexually active and didn't fill out the SHIM.   He has cardiovascular comorbities and a long smoking history.   Plan Prostate cancer  1. Radiation Oncology Referral Referral  Referral  Status: Hold For - Appointment,Records   Requested for: [id number] 2. Follow-up Office  Follow-up  Status: Hold For - Appointment,Date of Service  Requested  for: [phone]  I have discussed the options and risks as outlined in my handout on the treatment of prostate cancer and will not recapitulate the details.     I discussed Active surveillance and reviewed the risks and benefits.  He has intermediate risk disease with low volume Gleason 7(3+4) disease but another core with 90% Gleason 6 and he has a history of ASCAD and tobacco abuse.  He doesn't meet the strict criteria for AS but with his comorbities it would not be unreasonable to pursue that option even though that is not my recommendation.     I discussed Robotic Prostatectomy and reviewed the risks and benefits.  He is certainly a candidate for that procedure, but with  his comorbities, age and smoking history he would be at increased risk for complications and adverse outcomes. I don't feel that treatment is his best options.    I discussed EXRT and reviewed the risks and benefits. He is a good candidate for EXRT and I reviewed the use of the gold seed markers. This would be the least invasive option for him.    I discussed Brachytherapy and reviewed the risks  and benefits. He is potentially a good candidate for this procedure but he does have small volume Gleason 7 disease and I would defer a decision about the appropriateness of this option to radiation oncology. Otherwise he has a very low IPSS and prostate volume of 89ml whichh makes him low risk for voiding symptoms post treatment.     I discussed cryotherapy and reviewed the risks and benefits. He is potentially a candidate for this treatment but I don't have much experience with it. It does require more prolonged catheterization and has a more adverse impact on erectile function.  I told him if that is of interest to him I would refer him to one of my partners who does that treatment.    I mentioned HIFU but didn't go into detail since it is only recent approved for use in the [place] and I have no experience with the treatment.     After a review of the options, he is interested in being considered for brachytherapy so I will send him to see Dr. Ledon Snare for a consultation.     Time spent with the patient today was 47 minutes with >50% in face to face consultation.   Discussion/Summary CC: Collene Mares PA

## 2014-05-01 ENCOUNTER — Encounter (HOSPITAL_BASED_OUTPATIENT_CLINIC_OR_DEPARTMENT_OTHER): Payer: Self-pay | Admitting: *Deleted

## 2014-05-01 ENCOUNTER — Telehealth: Payer: Self-pay | Admitting: *Deleted

## 2014-05-01 DIAGNOSIS — C61 Malignant neoplasm of prostate: Secondary | ICD-10-CM | POA: Diagnosis not present

## 2014-05-01 NOTE — Anesthesia Preprocedure Evaluation (Addendum)
Anesthesia Evaluation  Patient identified by MRN, date of birth, ID band Patient awake    Reviewed: Allergy & Precautions, H&P , NPO status , Patient's Chart, lab work & pertinent test results, reviewed documented beta blocker date and time   Airway Mallampati: II  TM Distance: >3 FB Neck ROM: full    Dental  (+) Edentulous Upper, Edentulous Lower, Dental Advisory Given   Pulmonary COPDCurrent Smoker,  Moderate COPD breath sounds clear to auscultation  Pulmonary exam normal       Cardiovascular Exercise Tolerance: Good hypertension, Pt. on home beta blockers + CAD, + Past MI and + CABG Rhythm:regular Rate:Normal  myoview 8/15 no ischemia EF 49%   Neuro/Psych Ptosis due to injury negative neurological ROS  negative psych ROS   GI/Hepatic negative GI ROS, Neg liver ROS,   Endo/Other  negative endocrine ROSlow TSH level but euthyroid  Renal/GU negative Renal ROS  negative genitourinary   Musculoskeletal   Abdominal   Peds  Hematology negative hematology ROS (+)   Anesthesia Other Findings   Reproductive/Obstetrics negative OB ROS                            Anesthesia Physical Anesthesia Plan  ASA: III  Anesthesia Plan: General   Post-op Pain Management:    Induction: Intravenous  Airway Management Planned: LMA  Additional Equipment:   Intra-op Plan:   Post-operative Plan:   Informed Consent: I have reviewed the patients History and Physical, chart, labs and discussed the procedure including the risks, benefits and alternatives for the proposed anesthesia with the patient or authorized representative who has indicated his/her understanding and acceptance.   Dental Advisory Given  Plan Discussed with: CRNA and Surgeon  Anesthesia Plan Comments:         Anesthesia Quick Evaluation

## 2014-05-01 NOTE — Telephone Encounter (Signed)
CALLED PATIENT TO REMIND OF PROCEDURE FOR 05-02-14, SPOKE WITH PATIENT AND HE IS AWARE OF THIS PROCEDURE.

## 2014-05-02 ENCOUNTER — Encounter (HOSPITAL_BASED_OUTPATIENT_CLINIC_OR_DEPARTMENT_OTHER): Payer: Self-pay

## 2014-05-02 ENCOUNTER — Ambulatory Visit (HOSPITAL_BASED_OUTPATIENT_CLINIC_OR_DEPARTMENT_OTHER): Payer: Medicare Other | Admitting: Anesthesiology

## 2014-05-02 ENCOUNTER — Encounter (HOSPITAL_BASED_OUTPATIENT_CLINIC_OR_DEPARTMENT_OTHER)
Admission: RE | Disposition: A | Discharge status: Home / Self Care | Payer: Self-pay | Source: Ambulatory Visit | Attending: Urology

## 2014-05-02 ENCOUNTER — Ambulatory Visit (HOSPITAL_BASED_OUTPATIENT_CLINIC_OR_DEPARTMENT_OTHER)
Admission: RE | Admit: 2014-05-02 | Discharge: 2014-05-02 | Disposition: A | Discharge status: Home / Self Care | Payer: Medicare Other | Source: Ambulatory Visit | Attending: Urology | Admitting: Urology

## 2014-05-02 ENCOUNTER — Ambulatory Visit (HOSPITAL_COMMUNITY): Payer: Medicare Other

## 2014-05-02 DIAGNOSIS — Z886 Allergy status to analgesic agent status: Secondary | ICD-10-CM | POA: Insufficient documentation

## 2014-05-02 DIAGNOSIS — Z96649 Presence of unspecified artificial hip joint: Secondary | ICD-10-CM | POA: Diagnosis not present

## 2014-05-02 DIAGNOSIS — R972 Elevated prostate specific antigen [PSA]: Secondary | ICD-10-CM | POA: Diagnosis not present

## 2014-05-02 DIAGNOSIS — C61 Malignant neoplasm of prostate: Secondary | ICD-10-CM | POA: Insufficient documentation

## 2014-05-02 DIAGNOSIS — Z7982 Long term (current) use of aspirin: Secondary | ICD-10-CM | POA: Diagnosis not present

## 2014-05-02 DIAGNOSIS — I10 Essential (primary) hypertension: Secondary | ICD-10-CM | POA: Diagnosis not present

## 2014-05-02 DIAGNOSIS — I252 Old myocardial infarction: Secondary | ICD-10-CM | POA: Insufficient documentation

## 2014-05-02 DIAGNOSIS — M1990 Unspecified osteoarthritis, unspecified site: Secondary | ICD-10-CM | POA: Insufficient documentation

## 2014-05-02 DIAGNOSIS — J449 Chronic obstructive pulmonary disease, unspecified: Secondary | ICD-10-CM | POA: Diagnosis not present

## 2014-05-02 DIAGNOSIS — I4891 Unspecified atrial fibrillation: Secondary | ICD-10-CM | POA: Diagnosis not present

## 2014-05-02 DIAGNOSIS — I739 Peripheral vascular disease, unspecified: Secondary | ICD-10-CM | POA: Diagnosis not present

## 2014-05-02 DIAGNOSIS — Z955 Presence of coronary angioplasty implant and graft: Secondary | ICD-10-CM | POA: Diagnosis not present

## 2014-05-02 DIAGNOSIS — I2510 Atherosclerotic heart disease of native coronary artery without angina pectoris: Secondary | ICD-10-CM | POA: Diagnosis not present

## 2014-05-02 DIAGNOSIS — F17210 Nicotine dependence, cigarettes, uncomplicated: Secondary | ICD-10-CM | POA: Diagnosis not present

## 2014-05-02 DIAGNOSIS — Z87442 Personal history of urinary calculi: Secondary | ICD-10-CM | POA: Diagnosis not present

## 2014-05-02 HISTORY — DX: Diverticulosis of large intestine without perforation or abscess without bleeding: K57.30

## 2014-05-02 HISTORY — DX: Old myocardial infarction: I25.2

## 2014-05-02 HISTORY — DX: Personal history of colonic polyps: Z86.010

## 2014-05-02 HISTORY — DX: Nontoxic goiter, unspecified: E04.9

## 2014-05-02 HISTORY — DX: Personal history of other diseases of the digestive system: Z87.19

## 2014-05-02 HISTORY — DX: Unspecified hearing loss, unspecified ear: H91.90

## 2014-05-02 HISTORY — DX: Unspecified osteoarthritis, unspecified site: M19.90

## 2014-05-02 HISTORY — PX: RADIOACTIVE SEED IMPLANT: SHX5150

## 2014-05-02 HISTORY — DX: Personal history of urinary calculi: Z87.442

## 2014-05-02 HISTORY — DX: Presence of coronary angioplasty implant and graft: Z95.5

## 2014-05-02 HISTORY — DX: Personal history of colon polyps, unspecified: Z86.0100

## 2014-05-02 SURGERY — INSERTION, RADIATION SOURCE, PROSTATE
Anesthesia: General | Site: Prostate

## 2014-05-02 MED ORDER — TAMSULOSIN HCL 0.4 MG PO CAPS: 0.4000 mg | ORAL_CAPSULE | Freq: Every day | ORAL | Status: DC

## 2014-05-02 MED ORDER — OXYCODONE HCL 5 MG PO TABS
ORAL_TABLET | ORAL | Status: AC
  Filled 2014-05-02: qty 1

## 2014-05-02 MED ORDER — HYDROCODONE-ACETAMINOPHEN 5-325 MG PO TABS
1.0000 | ORAL_TABLET | Freq: Four times a day (QID) | ORAL | Status: DC | PRN
  Filled 2014-05-02: qty 1

## 2014-05-02 MED ORDER — LACTATED RINGERS IV SOLN
INTRAVENOUS | Status: DC
  Administered 2014-05-02 (×3): via INTRAVENOUS
  Filled 2014-05-02: qty 1000

## 2014-05-02 MED ORDER — CIPROFLOXACIN IN D5W 400 MG/200ML IV SOLN
INTRAVENOUS | Status: AC
  Filled 2014-05-02: qty 200

## 2014-05-02 MED ORDER — CIPROFLOXACIN IN D5W 400 MG/200ML IV SOLN
400.0000 mg | INTRAVENOUS | Status: AC
  Administered 2014-05-02: 400 mg via INTRAVENOUS
  Filled 2014-05-02: qty 200

## 2014-05-02 MED ORDER — KETOROLAC TROMETHAMINE 30 MG/ML IJ SOLN
INTRAMUSCULAR | Status: DC | PRN
  Administered 2014-05-02: 15 mg via INTRAVENOUS

## 2014-05-02 MED ORDER — FENTANYL CITRATE (PF) 100 MCG/2ML IJ SOLN
25.0000 ug | INTRAMUSCULAR | Status: DC | PRN
  Filled 2014-05-02: qty 1

## 2014-05-02 MED ORDER — SODIUM CHLORIDE 0.9 % IV SOLN
250.0000 mL | INTRAVENOUS | Status: DC | PRN
  Filled 2014-05-02: qty 250

## 2014-05-02 MED ORDER — ONDANSETRON HCL 4 MG/2ML IJ SOLN
INTRAMUSCULAR | Status: DC | PRN
  Administered 2014-05-02: 4 mg via INTRAVENOUS

## 2014-05-02 MED ORDER — SODIUM CHLORIDE 0.9 % IJ SOLN
3.0000 mL | Freq: Two times a day (BID) | INTRAMUSCULAR | Status: DC
  Filled 2014-05-02: qty 3

## 2014-05-02 MED ORDER — PROPOFOL 10 MG/ML IV BOLUS
INTRAVENOUS | Status: DC | PRN
  Administered 2014-05-02: 30 mg via INTRAVENOUS
  Administered 2014-05-02: 20 mg via INTRAVENOUS
  Administered 2014-05-02: 150 mg via INTRAVENOUS

## 2014-05-02 MED ORDER — LIDOCAINE HCL (CARDIAC) 20 MG/ML IV SOLN
INTRAVENOUS | Status: DC | PRN
  Administered 2014-05-02: 40 mg via INTRAVENOUS
  Administered 2014-05-02: 60 mg via INTRAVENOUS

## 2014-05-02 MED ORDER — STERILE WATER FOR IRRIGATION IR SOLN
Status: DC | PRN
  Administered 2014-05-02: 3000 mL
  Administered 2014-05-02: 500 mL

## 2014-05-02 MED ORDER — MIDAZOLAM HCL 2 MG/2ML IJ SOLN
INTRAMUSCULAR | Status: AC
  Filled 2014-05-02: qty 2

## 2014-05-02 MED ORDER — ACETAMINOPHEN 650 MG RE SUPP
650.0000 mg | RECTAL | Status: DC | PRN
  Filled 2014-05-02: qty 1

## 2014-05-02 MED ORDER — HYDROCODONE-ACETAMINOPHEN 5-325 MG PO TABS: 1.0000 | ORAL_TABLET | Freq: Four times a day (QID) | ORAL | Status: DC | PRN

## 2014-05-02 MED ORDER — PHENYLEPHRINE HCL 10 MG/ML IJ SOLN
INTRAMUSCULAR | Status: DC | PRN
  Administered 2014-05-02 (×3): 40 ug via INTRAVENOUS

## 2014-05-02 MED ORDER — BELLADONNA ALKALOIDS-OPIUM 16.2-60 MG RE SUPP
RECTAL | Status: AC
  Filled 2014-05-02: qty 1

## 2014-05-02 MED ORDER — ACETAMINOPHEN 325 MG PO TABS
650.0000 mg | ORAL_TABLET | ORAL | Status: DC | PRN
  Filled 2014-05-02: qty 2

## 2014-05-02 MED ORDER — DEXAMETHASONE SODIUM PHOSPHATE 4 MG/ML IJ SOLN
INTRAMUSCULAR | Status: DC | PRN
  Administered 2014-05-02: 10 mg via INTRAVENOUS

## 2014-05-02 MED ORDER — EPHEDRINE SULFATE 50 MG/ML IJ SOLN
INTRAMUSCULAR | Status: DC | PRN
  Administered 2014-05-02 (×5): 10 mg via INTRAVENOUS

## 2014-05-02 MED ORDER — CIPROFLOXACIN HCL 500 MG PO TABS: 500.0000 mg | ORAL_TABLET | Freq: Two times a day (BID) | ORAL | Status: DC

## 2014-05-02 MED ORDER — ACETAMINOPHEN 10 MG/ML IV SOLN
INTRAVENOUS | Status: DC | PRN
  Administered 2014-05-02: 1000 mg via INTRAVENOUS

## 2014-05-02 MED ORDER — FLEET ENEMA 7-19 GM/118ML RE ENEM
1.0000 | ENEMA | Freq: Once | RECTAL | Status: DC
  Filled 2014-05-02: qty 1

## 2014-05-02 MED ORDER — MIDAZOLAM HCL 5 MG/5ML IJ SOLN
INTRAMUSCULAR | Status: DC | PRN
  Administered 2014-05-02: 1 mg via INTRAVENOUS

## 2014-05-02 MED ORDER — PROMETHAZINE HCL 25 MG PO TABS: 25.0000 mg | ORAL_TABLET | Freq: Four times a day (QID) | ORAL | Status: DC | PRN

## 2014-05-02 MED ORDER — IOHEXOL 350 MG/ML SOLN
INTRAVENOUS | Status: DC | PRN
  Administered 2014-05-02: 7 mL

## 2014-05-02 MED ORDER — SODIUM CHLORIDE 0.9 % IJ SOLN
3.0000 mL | INTRAMUSCULAR | Status: DC | PRN
  Filled 2014-05-02: qty 3

## 2014-05-02 MED ORDER — FENTANYL CITRATE (PF) 100 MCG/2ML IJ SOLN
INTRAMUSCULAR | Status: DC | PRN
  Administered 2014-05-02 (×5): 25 ug via INTRAVENOUS
  Administered 2014-05-02: 50 ug via INTRAVENOUS
  Administered 2014-05-02: 25 ug via INTRAVENOUS

## 2014-05-02 MED ORDER — LACTATED RINGERS IV SOLN
INTRAVENOUS | Status: DC
  Filled 2014-05-02: qty 1000

## 2014-05-02 MED ORDER — FENTANYL CITRATE (PF) 100 MCG/2ML IJ SOLN
INTRAMUSCULAR | Status: AC
  Filled 2014-05-02: qty 6

## 2014-05-02 MED ORDER — OXYCODONE HCL 5 MG PO TABS
5.0000 mg | ORAL_TABLET | ORAL | Status: DC | PRN
  Administered 2014-05-02: 5 mg via ORAL
  Filled 2014-05-02: qty 2

## 2014-05-02 SURGICAL SUPPLY — 25 items
BAG URINE DRAINAGE (UROLOGICAL SUPPLIES) ×3 IMPLANT
BLADE CLIPPER SURG (BLADE) ×3 IMPLANT
CATH FOLEY 2WAY SLVR  5CC 16FR (CATHETERS) ×2
CATH FOLEY 2WAY SLVR 5CC 16FR (CATHETERS) ×1 IMPLANT
CATH ROBINSON RED A/P 20FR (CATHETERS) ×3 IMPLANT
CLOTH BEACON ORANGE TIMEOUT ST (SAFETY) ×3 IMPLANT
COVER BACK TABLE 60X90IN (DRAPES) ×3 IMPLANT
COVER MAYO STAND STRL (DRAPES) ×3 IMPLANT
DRSG TEGADERM 4X4.75 (GAUZE/BANDAGES/DRESSINGS) ×3 IMPLANT
DRSG TEGADERM 8X12 (GAUZE/BANDAGES/DRESSINGS) ×3 IMPLANT
GLOVE BIO SURGEON STRL SZ7.5 (GLOVE) IMPLANT
GLOVE ECLIPSE 8.0 STRL XLNG CF (GLOVE) ×12 IMPLANT
GLOVE SURG SS PI 8.0 STRL IVOR (GLOVE) ×9 IMPLANT
GOWN STRL REUS W/ TWL LRG LVL3 (GOWN DISPOSABLE) ×1 IMPLANT
GOWN STRL REUS W/ TWL XL LVL3 (GOWN DISPOSABLE) ×2 IMPLANT
GOWN STRL REUS W/TWL LRG LVL3 (GOWN DISPOSABLE) ×2
GOWN STRL REUS W/TWL XL LVL3 (GOWN DISPOSABLE) ×4
HOLDER FOLEY CATH W/STRAP (MISCELLANEOUS) ×3 IMPLANT
PACK CYSTO (CUSTOM PROCEDURE TRAY) ×3 IMPLANT
SPONGE GAUZE 4X4 12PLY STER LF (GAUZE/BANDAGES/DRESSINGS) ×3 IMPLANT
SYRINGE 10CC LL (SYRINGE) ×3 IMPLANT
UNDERPAD 30X30 INCONTINENT (UNDERPADS AND DIAPERS) ×6 IMPLANT
WATER STERILE IRR 3000ML UROMA (IV SOLUTION) ×3 IMPLANT
WATER STERILE IRR 500ML POUR (IV SOLUTION) ×3 IMPLANT
radioactive seed ×237 IMPLANT

## 2014-05-02 NOTE — Anesthesia Procedure Notes (Signed)
Procedure Name: LMA Insertion Date/Time: 05/02/2014 7:54 AM Performed by: Mechele Claude Pre-anesthesia Checklist: Patient identified, Emergency Drugs available, Suction available and Patient being monitored Patient Re-evaluated:Patient Re-evaluated prior to inductionOxygen Delivery Method: Circle System Utilized Preoxygenation: Pre-oxygenation with 100% oxygen Intubation Type: IV induction Ventilation: Mask ventilation without difficulty LMA: LMA inserted LMA Size: 4.0 Number of attempts: 1 Airway Equipment and Method: bite block Placement Confirmation: positive ETCO2 Tube secured with: Tape Dental Injury: Teeth and Oropharynx as per pre-operative assessment

## 2014-05-02 NOTE — Op Note (Signed)
PATIENT:  Vincent Carter  PRE-OPERATIVE DIAGNOSIS:  Adenocarcinoma of the prostate  POST-OPERATIVE DIAGNOSIS:  Same  PROCEDURE:  Procedure(s): 1. I-125 radioactive seed implantation 2. Cystoscopy  SURGEON:  Surgeon(s): Irine Seal MD  Radiation oncologist: Dr. Tyler Pita  ANESTHESIA:  General  EBL:  Minimal  DRAINS: 43 French Foley catheter  INDICATION: [name] is a 70 y.o. with Stage T1c, Gleason 7 prostate cancer who has elected brachytherapy for treatment.  Description of procedure: After informed consent the patient was brought to the major OR, placed on the table and administered general anesthesia. He was then moved to the modified lithotomy position with his perineum perpendicular to the floor. His perineum and genitalia were then sterilely prepped. An official timeout was then performed. A 16 French Foley catheter was then placed in the bladder and filled with dilute contrast, a rectal tube was placed in the rectum and the transrectal ultrasound probe was placed in the rectum and affixed to the stand. He was then sterilely draped.  The sterile grid was installed.   Anchor needles were then placed.   Real time ultrasonography was used along with the seed planning software spot-pro version 3.1-00. This was used to develop the seed plan including the number of needles as well as number of seeds required for complete and adequate coverage. Real-time ultrasonography was then used along with the previously developed plan and the Nucletron device to implant a total of 79 seeds using 25 needles for a target dose of 145 Gy. This proceeded without difficulty or complication.  A Foley catheter was then removed as well as the transrectal ultrasound probe and rectal probe. Flexible cystoscopy was then performed using the 17 French flexible scope which revealed a normal urethra throughout its length down to the sphincter which appeared intact. 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 The bladder was then entered and fully and systematically.  The ureteral orifices were noted to be of normal configuration and position. The mucosa revealed no evidence of tumors. There were also no stones identified within the bladder. There was a very small clot noted.  No seeds or spacers were seen and/or removed from the bladder.  The cystoscope was then removed.  The drapes were removed.  The perineum was cleaned and dressed.  He was taken out of the lithotomy position and was awakened and taken to recovery room in stable and satisfactory condition. He tolerated procedure well and there were no intraoperative complications.

## 2014-05-02 NOTE — Progress Notes (Signed)
  Radiation Oncology         [phone] ________________________________  Name: WILHELM GANAWAY MRN: [id number]  Date: 05/02/2014  DOB: 1945/04/01       Prostate Seed Implant  IR:WERXV,QMGQQPYP J., MD  No ref. provider found  DIAGNOSIS: 69 y.o. gentleman with stage T1c adenocarcinoma of the prostate with a Gleason's score of 3+4 and a PSA of 4.71  PROCEDURE: Insertion of radioactive I-125 seeds into the prostate gland.  RADIATION DOSE: 145 Gy, definitive therapy.  TECHNIQUE: YOHAN SAMONS was brought to the operating room with the urologist. He was placed in the dorsolithotomy position. He was catheterized and a rectal tube was inserted. The perineum was shaved, prepped and draped. The ultrasound probe was then introduced into the rectum to see the prostate gland.  TREATMENT DEVICE: A needle grid was attached to the ultrasound probe stand and anchor needles were placed.  3D PLANNING: The prostate was imaged in 3D using a sagittal sweep of the prostate probe. These images were transferred to the planning computer. There, the prostate, urethra and rectum were defined on each axial reconstructed image. Then, the software created an optimized 3D plan and a few seed positions were adjusted. The quality of the plan was reviewed using [facility] information for the target and the following two organs at risk:  Urethra and Rectum.  Then the accepted plan was uploaded to the seed Selectron afterloading unit.  PROSTATE VOLUME STUDY:  Using transrectal ultrasound the volume of the prostate was verified to be 45.5 cc.  SPECIAL TREATMENT PROCEDURE/SUPERVISION AND HANDLING: The Nucletron FIRST system was used to place the needles under sagittal guidance. A total of 25 needles were used to deposit 79 seeds in the prostate gland. The individual seed activity was 0.615 mCi.  COMPLEX SIMULATION: At the end of the procedure, an anterior radiograph of the pelvis was obtained to document seed positioning and  count. Cystoscopy was performed to check the urethra and bladder.  MICRODOSIMETRY: At the end of the procedure, the patient was emitting 0.003 mR/hr at 1 meter. Accordingly, he was considered safe for hospital discharge.  PLAN: The patient will return to the radiation oncology clinic for post implant CT dosimetry in three weeks.   ________________________________  Sheral Apley Tammi Klippel, M.D.

## 2014-05-02 NOTE — Anesthesia Postprocedure Evaluation (Signed)
  Anesthesia Post-op Note  Patient: Vincent Carter  Procedure(s) Performed: Procedure(s) (LRB): RADIOACTIVE SEED IMPLANT (N/A)  Patient Location: PACU  Anesthesia Type: General  Level of Consciousness: awake and alert   Airway and Oxygen Therapy: Patient Spontanous Breathing  Post-op Pain: mild  Post-op Assessment: Post-op Vital signs reviewed, Patient's Cardiovascular Status Stable, Respiratory Function Stable, Patent Airway and No signs of Nausea or vomiting  Last Vitals:  Filed Vitals:   05/02/14 1045  BP: 145/80  Pulse: 73  Temp:   Resp: 20    Post-op Vital Signs: stable   Complications: No apparent anesthesia complications

## 2014-05-02 NOTE — Discharge Instructions (Addendum)
Brachytherapy for Prostate Cancer, Care After Refer to this sheet in the next few weeks. These instructions provide you with information on caring for yourself after your procedure. Your health care provider may also give you more specific instructions. Your treatment has been planned according to current medical practices, but problems sometimes occur. Call your health care provider if you have any problems or questions after your procedure. WHAT TO EXPECT AFTER THE PROCEDURE The area behind the scrotum will probably be tender and bruised. For a short period of time you may have: 1. Difficulty passing urine. You may need a catheter for a few days to a month. 2. Blood in the urine or semen. 3. A feeling of constipation because of prostate swelling. 4. Frequent feeling of an urgent need to urinate. For a long period of time you may have:  Inflammation of the rectum. This happens in about 2% of people who have the procedure.  Erection problems. These vary with age and occur in about 15-40% of men.  Difficulty urinating. This is caused by scarring in the urethra.  Diarrhea. HOME CARE INSTRUCTIONS   Take medicines only as directed by your health care provider.  You will probably have a catheter in your bladder for several days. You will have blood in the urine bag and should drink a lot of fluids to keep it a light red color.  Keep all follow-up visits as directed by your health care provider. If you have a catheter, it will be removed during one of these visits.  Try not to sit directly on the area behind the scrotum. A soft cushion can decrease the discomfort. Ice packs may also be helpful for the discomfort. Do not put ice directly on the skin.  Shower and wash the area behind the scrotum gently. Do not sit in a tub.  If you have had the brachytherapy that uses the seeds, limit your close contact with children and pregnant women for 2 months because of the radiation still in the prostate.  After that period of time, the levels drop off quickly. SEEK IMMEDIATE MEDICAL CARE IF:   You have a fever.  You have chills.  You have shortness of breath.  You have chest pain.  You have thick blood, like tomato juice, in the urine bag.  Your catheter is blocked so urine cannot get into the bag. Your bladder area or lower abdomen may be swollen.  There is excessive bleeding from your rectum. It is normal to have a little blood mixed with your stool.  There is severe discomfort in the treated area that does not go away with pain medicine.  You have abdominal discomfort.  You have severe nausea or vomiting.  You develop any new or unusual symptoms. Document Released: 01/30/2010 Document Revised: 05/14/2013 Document Reviewed: 06/20/2012 Radioactive Seed Implant Home Care Instructions   Activity:    Rest for the remainder of the day.  Do not drive or operate equipment today.  You may resume normal  activities in a few days as instructed by your physician, without risk of harmful radiation exposure to those around you, provided you follow the time and distance precautions on the Radiation Oncology Instruction Sheet.   Meals: Drink plenty of lipuids and eat light foods, such as gelatin or soup this evening .  You may return to normal meal plan tomorrow.  Return To Work: You may return to work as instructed by [employer].  Call your physician if any of these symptoms  occur:   Persistent or heavy bleeding  Urine stream diminishes or stops completely after catheter is removed  Fever equal to or greater than 101 degrees F  Cloudy urine with a strong foul odor  Severe pain  You may feel some burning pain and/or hesitancy when you urinate after the catheter is removed.  These symptoms may increase over the next few weeks, but should diminish within forur to six weeks.  Applying moist heat to the lower abdomen or a hot tub bath may help relieve the pain.  If the discomfort  becomes severe, please call your physician for additional medications.    ExitCare Patient Information 2015 [place]. This information is not intended to replace advice given to you by your health care provider. Make sure you discuss any questions you have with your health care provider.   Post Anesthesia Home Care Instructions  Activity: Get plenty of rest for the remainder of the day. A responsible adult should stay with you for 24 hours following the procedure.  For the next 24 hours, DO NOT: -Drive a car -Paediatric nurse -Drink alcoholic beverages -Take any medication unless instructed by your physician -Make any legal decisions or sign important papers.  Meals: Start with liquid foods such as gelatin or soup. Progress to regular foods as tolerated. Avoid greasy, spicy, heavy foods. If nausea and/or vomiting occur, drink only clear liquids until the nausea and/or vomiting subsides. Call your physician if vomiting continues.  Special Instructions/Symptoms: Your throat may feel dry or sore from the anesthesia or the breathing tube placed in your throat during surgery. If this causes discomfort, gargle with warm salt water. The discomfort should disappear within 24 hours.  If you had a scopolamine patch placed behind your ear for the management of post- operative nausea and/or vomiting:  1. The medication in the patch is effective for 72 hours, after which it should be removed.  Wrap patch in a tissue and discard in the trash. Wash hands thoroughly with soap and water. 2. You may remove the patch earlier than 72 hours if you experience unpleasant side effects which may include dry mouth, dizziness or visual disturbances. 3. Avoid touching the patch. Wash your hands with soap and water after contact with the patch.

## 2014-05-02 NOTE — Interval H&P Note (Signed)
History and Physical Interval Note:  He has no new complaints or questions.  05/02/2014 7:17 AM  Vincent Carter  has presented today for surgery, with the diagnosis of PROSTATE CANCER  The various methods of treatment have been discussed with the patient and family. After consideration of risks, benefits and other options for treatment, the patient has consented to  Procedure(s) with comments: RADIOACTIVE SEED IMPLANT (N/A) - DR PORTABLE as a surgical intervention .  The patient's history has been reviewed, patient examined, no change in status, stable for surgery.  I have reviewed the patient's chart and labs.  Questions were answered to the patient's satisfaction.     Jassica Zazueta J

## 2014-05-02 NOTE — Transfer of Care (Signed)
Last Vitals:  Filed Vitals:   05/02/14 0628  BP: 139/74  Pulse: 66  Temp: 36.5 C  Resp: 20    Immediate Anesthesia Transfer of Care Note  Patient: Vincent Carter  Procedure(s) Performed: Procedure(s) (LRB): RADIOACTIVE SEED IMPLANT (N/A)  Patient Location: PACU  Anesthesia Type: General  Level of Consciousness: awake, alert  and oriented  Airway & Oxygen Therapy: Patient Spontanous Breathing and Patient connected to face mask oxygen  Post-op Assessment: Report given to PACU RN and Post -op Vital signs reviewed and stable  Post vital signs: Reviewed and stable  Complications: No apparent anesthesia complications

## 2014-05-03 ENCOUNTER — Encounter (HOSPITAL_BASED_OUTPATIENT_CLINIC_OR_DEPARTMENT_OTHER): Payer: Self-pay | Admitting: Urology

## 2014-05-06 DIAGNOSIS — L259 Unspecified contact dermatitis, unspecified cause: Secondary | ICD-10-CM | POA: Diagnosis not present

## 2014-05-06 DIAGNOSIS — T7840XA Allergy, unspecified, initial encounter: Secondary | ICD-10-CM | POA: Diagnosis not present

## 2014-05-06 DIAGNOSIS — Z6827 Body mass index (BMI) 27.0-27.9, adult: Secondary | ICD-10-CM | POA: Diagnosis not present

## 2014-05-06 DIAGNOSIS — E663 Overweight: Secondary | ICD-10-CM | POA: Diagnosis not present

## 2014-05-23 DIAGNOSIS — C61 Malignant neoplasm of prostate: Secondary | ICD-10-CM | POA: Diagnosis not present

## 2014-05-29 ENCOUNTER — Telehealth: Payer: Self-pay | Admitting: *Deleted

## 2014-05-29 NOTE — Telephone Encounter (Signed)
Called patient to remind of appts. For 05-30-14, spoke with patient and he is aware of these appts.

## 2014-05-29 NOTE — Telephone Encounter (Signed)
xxxx 

## 2014-05-30 ENCOUNTER — Encounter: Payer: Self-pay | Admitting: Radiation Oncology

## 2014-05-30 ENCOUNTER — Ambulatory Visit
Admit: 2014-05-30 | Discharge: 2014-05-30 | Disposition: A | Discharge status: Home / Self Care | Payer: Medicare Other | Attending: Radiation Oncology | Admitting: Radiation Oncology

## 2014-05-30 ENCOUNTER — Ambulatory Visit: Payer: Medicare Other | Admitting: Radiation Oncology

## 2014-05-30 ENCOUNTER — Ambulatory Visit
Admission: RE | Admit: 2014-05-30 | Discharge: 2014-05-30 | Disposition: A | Discharge status: Home / Self Care | Payer: Medicare Other | Source: Ambulatory Visit | Attending: Radiation Oncology | Admitting: Radiation Oncology

## 2014-05-30 VITALS — BP 134/63 | HR 55 | Resp 16 | Wt 150.8 lb

## 2014-05-30 DIAGNOSIS — F17210 Nicotine dependence, cigarettes, uncomplicated: Secondary | ICD-10-CM | POA: Diagnosis not present

## 2014-05-30 DIAGNOSIS — I2510 Atherosclerotic heart disease of native coronary artery without angina pectoris: Secondary | ICD-10-CM | POA: Diagnosis not present

## 2014-05-30 DIAGNOSIS — J449 Chronic obstructive pulmonary disease, unspecified: Secondary | ICD-10-CM | POA: Diagnosis not present

## 2014-05-30 DIAGNOSIS — C61 Malignant neoplasm of prostate: Secondary | ICD-10-CM

## 2014-05-30 DIAGNOSIS — I10 Essential (primary) hypertension: Secondary | ICD-10-CM | POA: Diagnosis not present

## 2014-05-30 DIAGNOSIS — Z7982 Long term (current) use of aspirin: Secondary | ICD-10-CM | POA: Diagnosis not present

## 2014-05-30 DIAGNOSIS — Z7951 Long term (current) use of inhaled steroids: Secondary | ICD-10-CM | POA: Diagnosis not present

## 2014-05-30 DIAGNOSIS — I739 Peripheral vascular disease, unspecified: Secondary | ICD-10-CM | POA: Diagnosis not present

## 2014-05-30 DIAGNOSIS — Z510 Encounter for antineoplastic radiation therapy: Secondary | ICD-10-CM | POA: Diagnosis not present

## 2014-05-30 NOTE — Progress Notes (Signed)
Radiation Oncology         [phone] ________________________________  Name: Vincent Carter MRN: [id number]  Date: 05/30/2014  DOB: May 24, 1945    Follow-Up Visit Note  CC: Glo Herring., MD  Irine Seal, MD  Diagnosis:  Stage T1c adenocarcinoma of the prostate with a Gleason's score of 3+4 and a PSA of 4.71    ICD-9-CM ICD-10-CM   1. Malignant neoplasm of prostate 185 C61     Interval Since Last Radiation:  3  weeks  Narrative:  The patient returns today for routine follow-up.  He is complaining of increased urinary frequency and urinary hesitation symptoms. He filled out a questionnaire regarding urinary function today providing and overall IPSS score of 26 characterizing his symptoms as severe.  His pre-implant score was 4. He denies any bowel symptoms.  Weight and vitals stable. Denies pain. Reports often he feel like he has to void but, "nothing comes out." Reports when he does void he feel like he completely empties but, shortly there after needs to void again persistently. Reports consistent dysuria. Pre seed IPSS 4. Post seed IPSS 26. Reports he constantly feels uncomfortable but, continues to live his daily life. Reports he does feel weak. Reports he has been battle constipation since seeds were implanted. Reports he finally had a bowel movement last night after using stool softeners and increasing fiber. Reports bowel habits haven't returned to normal since seed implantation. Denies hematuria. Denies blood in stool. Seen by Ralene Muskrat NP last Thursday reference symptoms. U/A returned normal in the office thus, no new orders given to patient. Questions if Flomax 0.4 can be increased. Reports following surgery blisters appeared on his low back. Seen by PCP. Tx with Valtrex and blister resolved.  Currently takes his Flomax in the morning, advised to switch to taking it at night. Reports frequency. Reports nocturia x4. Discussed taking Flomax twice a day, one in the morning and  one at night. Advised to let his urologist know if he increases his dose. Were curious about having patient ride with children on a 5 hour trip to [place], advised for the pt to sit in the front seat with children in the 3rd row seating of the SUV.  ALLERGIES:  is allergic to codeine and statins.  Meds: Current Outpatient Prescriptions  Medication Sig Dispense Refill  . aspirin EC 81 MG tablet Take 81 mg by mouth 2 (two) times daily.    . carvedilol (COREG) 3.125 MG tablet Take 1 tablet (3.125 mg total) by mouth 2 (two) times daily. 180 tablet 3  . ciprofloxacin (CIPRO) 500 MG tablet Take 1 tablet (500 mg total) by mouth 2 (two) times daily. 6 tablet 0  . clotrimazole-betamethasone (LOTRISONE) cream     . fluticasone (FLONASE) 50 MCG/ACT nasal spray Place into both nostrils daily as needed.     Marland Kitchen HYDROcodone-acetaminophen (NORCO) 5-325 MG per tablet Take 1 tablet by mouth every 6 (six) hours as needed for moderate pain. 20 tablet 0  . mupirocin ointment (BACTROBAN) 2 %     . nitroGLYCERIN (NITROSTAT) 0.4 MG SL tablet Place 0.4 mg under the tongue every 5 (five) minutes as needed for chest pain.    . Omega-3 Fatty Acids (FISH OIL) 300 MG CAPS Take 2 capsules by mouth 2 (two) times daily.      . promethazine (PHENERGAN) 25 MG tablet Take 1 tablet (25 mg total) by mouth every 6 (six) hours as needed for nausea or vomiting. 15 tablet 0  . tamsulosin (  FLOMAX) 0.4 MG CAPS capsule Take 1 capsule (0.4 mg total) by mouth daily. 30 capsule 1  . valACYclovir (VALTREX) 1000 MG tablet      No current facility-administered medications for this encounter.    Physical Findings: The patient is in no acute distress. Patient is alert and oriented.  blood pressure is 134/63 and his pulse is 55. His respiration is 16. .  No significant changes.  Lab Findings: Lab Results  Component Value Date   WBC 7.7 04/25/2014   HGB 16.6 04/25/2014   HCT 49.9 04/25/2014   MCV 85.2 04/25/2014   PLT 167 04/25/2014      Radiographic Findings:  Patient underwent CT imaging in our clinic for post implant dosimetry. The CT appears to demonstrate an adequate distribution of radioactive seeds throughout the prostate gland. There no seeds in her near the rectum. I suspect the final radiation plan and dosimetry will show appropriate coverage of the prostate gland.   Impression: The patient is recovering from the effects of radiation. His urinary symptoms should gradually improve over the next 4-6 months. We talked about this today. He is encouraged by his improvement already and is otherwise please with his outcome.   Plan: Today, I spent time talking to the patient about his prostate seed implant and resolving urinary symptoms. We also talked about long-term follow-up for prostate cancer following seed implant. He understands that ongoing PSA determinations and digital rectal exams will help perform surveillance to rule out disease recurrence. He understands what to expect with his PSA measures. Patient was also educated today about some of the long-term effects from radiation including a small risk for rectal bleeding and possibly erectile dysfunction. We talked about some of the general management approaches to these potential complications. However, I did encourage the patient to contact our office or return at any point if he has questions or concerns related to his previous radiation and prostate cancer.  This document serves as a record of services personally performed by Tyler Pita, MD. It was created on his behalf by Arlyce Harman, a trained medical scribe. The creation of this record is based on the scribe's personal observations and the provider's statements to them. This document has been checked and approved by the attending provider.     _____________________________________  Sheral Apley. Tammi Klippel, M.D.

## 2014-05-30 NOTE — Progress Notes (Addendum)
Weight and vitals stable. Denies pain. Reports often he feel like he has to void but, "nothing comes out." Reports when he does void he feel like he completely empties but, shortly there after needs to void again persistently. Reports consistent dysuria.  Pre seed IPSS 4. Post seed IPSS 26. Reports he constantly feels uncomfortable but, continues to live his daily life. Reports he does feel weak. Reports he has been battle constipation since seeds were implanted. Reports he finally had a bowel movement last night after using stool softeners and increasing fiber. Reports bowel habits haven't returned to normal since seed implantation. Denies hematuria. Denies blood in stool. Seen by Ralene Muskrat NP last Thursday reference symptoms. U/A returned normal in the office thus, no new orders given to patient. Questions if Flomax 0.4 can be increased. Reports following surgery blisters appeared on his low back. Seen by PCP. Tx with Valtrex and blister resolved.

## 2014-05-30 NOTE — Progress Notes (Signed)
  Radiation Oncology         [phone] ________________________________  Name: Vincent Carter MRN: [id number]  Date: 05/30/2014  DOB: [birth year]  COMPLEX SIMULATION NOTE  NARRATIVE:  The patient was brought to the [place] today following prostate seed implantation approximately one month ago.  Identity was confirmed.  All relevant records and images related to the planned course of therapy were reviewed.  Then, the patient was set-up supine.  CT images were obtained.  The CT images were loaded into the planning software.  Then the prostate and rectum were contoured.  Treatment planning then occurred.  The implanted iodine 125 seeds were identified by the physics staff for projection of radiation distribution  I have requested : 3D Simulation  I have requested a DVH of the following structures: Prostate and rectum.    ________________________________  Sheral Apley Tammi Klippel, M.D.

## 2014-05-30 NOTE — Addendum Note (Signed)
Encounter addended by: Heywood Footman, RN on: 05/30/2014 11:28 AM<BR>     Documentation filed: Vitals Section

## 2014-06-20 ENCOUNTER — Ambulatory Visit: Payer: Medicare Other | Attending: Radiation Oncology

## 2014-06-20 DIAGNOSIS — C61 Malignant neoplasm of prostate: Secondary | ICD-10-CM | POA: Insufficient documentation

## 2014-06-23 ENCOUNTER — Encounter: Payer: Self-pay | Admitting: Radiation Oncology

## 2014-06-23 NOTE — Progress Notes (Addendum)
  Radiation Oncology         [phone] ________________________________  Name: KIMBALL APPLEBY MRN: [id number]  Date: 06/20/2014  DOB: Jun 03, 1945  3D Planning Note   Prostate Brachytherapy Post-Implant Dosimetry  Diagnosis: Stage T1c adenocarcinoma of the prostate with a Gleason's score of 3+4 and a PSA of 4.71  Narrative: On a previous date, ZHAIRE LOCKER returned following prostate seed implantation for post implant planning. He underwent CT scan complex simulation to delineate the three-dimensional structures of the pelvis and demonstrate the radiation distribution.  Since that time, the seed localization, and complex isodose planning with dose volume histograms have now been completed.  Results:   Prostate Coverage - The dose of radiation delivered to the 90% or more of the prostate gland (D90) was 112.67% of the prescription dose. This exceeds our goal of greater than 90%. Rectal Sparing - The volume of rectal tissue receiving the prescription dose or higher was 0.0 cc. This falls under our thresholds tolerance of 1.0 cc.  Impression: The prostate seed implant appears to show adequate target coverage and appropriate rectal sparing.  Plan:  The patient will continue to follow with urology for ongoing PSA determinations. I would anticipate a high likelihood for local tumor control with minimal risk for rectal morbidity.  ________________________________  Sheral Apley Tammi Klippel, M.D.

## 2014-06-25 NOTE — Addendum Note (Signed)
Encounter addended by: Heywood Footman, RN on: 06/25/2014  2:33 PM<BR>     Documentation filed: Inpatient Document Flowsheet

## 2014-07-16 ENCOUNTER — Other Ambulatory Visit: Payer: Self-pay

## 2014-07-16 ENCOUNTER — Telehealth: Payer: Self-pay | Admitting: Cardiology

## 2014-07-16 MED ORDER — CARVEDILOL 3.125 MG PO TABS: 3.1250 mg | ORAL_TABLET | Freq: Two times a day (BID) | ORAL | Status: DC

## 2014-07-16 NOTE — Telephone Encounter (Signed)
Pt has 2 wks left of his BP Meds--no apt times available till Sept.

## 2014-07-16 NOTE — Telephone Encounter (Signed)
rx refilled.

## 2014-07-24 DIAGNOSIS — C61 Malignant neoplasm of prostate: Secondary | ICD-10-CM | POA: Diagnosis not present

## 2014-07-31 DIAGNOSIS — C61 Malignant neoplasm of prostate: Secondary | ICD-10-CM | POA: Diagnosis not present

## 2014-07-31 DIAGNOSIS — R3912 Poor urinary stream: Secondary | ICD-10-CM | POA: Diagnosis not present

## 2014-07-31 DIAGNOSIS — N400 Enlarged prostate without lower urinary tract symptoms: Secondary | ICD-10-CM | POA: Diagnosis not present

## 2014-08-13 ENCOUNTER — Ambulatory Visit (INDEPENDENT_AMBULATORY_CARE_PROVIDER_SITE_OTHER): Payer: Medicare Other | Admitting: Cardiology

## 2014-08-13 ENCOUNTER — Encounter: Payer: Self-pay | Admitting: Cardiology

## 2014-08-13 VITALS — BP 132/70 | HR 67 | Ht 62.0 in | Wt 152.0 lb

## 2014-08-13 DIAGNOSIS — I2510 Atherosclerotic heart disease of native coronary artery without angina pectoris: Secondary | ICD-10-CM | POA: Diagnosis not present

## 2014-08-13 DIAGNOSIS — I10 Essential (primary) hypertension: Secondary | ICD-10-CM | POA: Diagnosis not present

## 2014-08-13 NOTE — Patient Instructions (Signed)
Your physician wants you to follow-up in: [place] DR. BRANCH. You will receive a reminder letter in the mail two months in advance. If you don't receive a letter, please call our office to schedule the follow-up appointment.  Your physician recommends that you continue on your current medications as directed. Please refer to the Current Medication list given to you today.  PLEASE HAVE LAB WORK DONE : FASTING LIPID PANEL  Thanks for choosing [facility]!!!

## 2014-08-13 NOTE — Progress Notes (Signed)
Patient ID: Vincent Carter, male   DOB: 1945/07/24, 69 y.o.   MRN: [id number]     Clinical Summary Mr. Butterfield is a 68 y.o.male seen today for follow up of the following medical problems.   1. CAD  - CABG in 1997, redo in 2006 as described below.  - ejection fraction in 2006 reported at 40-45%. Repeat echo 02/2013 LVEF 45-50%.   - denies any chest pain, no SOB or DOE.  - compliant with meds  2. HTN  - checks at home occasionally, typically around 130s/60s - compliant with meds  3. Hyperlipidemia Last panel 02/2013: TC 87 TG 69 HDL 36 LDL 37 Statins cause headaches and fatigue, currently not on therapy  4. Tobacco - not ready to quit completely, has decreased use since started using e-cig  5. Prostate CA - followed by urology  Past Medical History  Diagnosis Date  . ASCVD (arteriosclerotic cardiovascular disease) cardiologist--  is now dr Roderic [facility] (was dr [name])     CABG in 1997 -- LIMA to LAD;  cath 07-17-2012 PCI and BMStenting;   RE-DO CABG X4  in  07-24-2004 three-vessel disease; cath in 08-24-2004 post CABG ef 40-45% w/ anteroapical hypokinesis; occlusion SVG  to  RI; patent RIMA to LAD; unsuccessful PCI in totally occluded RI graft.;  last myoview 09-09-2013, no ischemia, moderate inferior wall defect, ef 49%  . Chronic obstructive pulmonary disease   . Hypertension   . Ptosis     Left, secondary to injury  . Low TSH level     Clinically euthyroid  . Peripheral vascular disease     Decreased distal pulses and abnormal Doppler  . Euthyroid goiter     with low TSH   . Prostate cancer urologist--  dr Jenny Reichmann wrenn    T1c, Gleason 3+4=7,  PSA 4.71,  vol 38cc  . History of kidney stones   . History of colon polyps     hyperplastic 2011  . Diverticulosis of colon   . History of diverticulitis   . History of ST elevation myocardial infarction (STEMI)     07-17-2004   w/ extensive RV infact per cath report  . S/p bare metal coronary artery stent     x3   to RCA  07-17-2004  . Arthritis   . HOH (hard of hearing)     no hearing aid     Allergies  Allergen Reactions  . Codeine Nausea Only    Pill form of codeine causes nausea;   . Statins Other (See Comments)    myalgia     Current Outpatient Prescriptions  Medication Sig Dispense Refill  . aspirin EC 81 MG tablet Take 81 mg by mouth 2 (two) times daily.    . carvedilol (COREG) 3.125 MG tablet Take 1 tablet (3.125 mg total) by mouth 2 (two) times daily. 180 tablet 0  . ciprofloxacin (CIPRO) 500 MG tablet Take 1 tablet (500 mg total) by mouth 2 (two) times daily. 6 tablet 0  . clotrimazole-betamethasone (LOTRISONE) cream     . fluticasone (FLONASE) 50 MCG/ACT nasal spray Place into both nostrils daily as needed.     Marland Kitchen HYDROcodone-acetaminophen (NORCO) 5-325 MG per tablet Take 1 tablet by mouth every 6 (six) hours as needed for moderate pain. 20 tablet 0  . mupirocin ointment (BACTROBAN) 2 %     . nitroGLYCERIN (NITROSTAT) 0.4 MG SL tablet Place 0.4 mg under the tongue every 5 (five) minutes as needed for chest pain.    Marland Kitchen  Omega-3 Fatty Acids (FISH OIL) 300 MG CAPS Take 2 capsules by mouth 2 (two) times daily.      . promethazine (PHENERGAN) 25 MG tablet Take 1 tablet (25 mg total) by mouth every 6 (six) hours as needed for nausea or vomiting. 15 tablet 0  . tamsulosin (FLOMAX) 0.4 MG CAPS capsule Take 1 capsule (0.4 mg total) by mouth daily. 30 capsule 1  . valACYclovir (VALTREX) 1000 MG tablet      No current facility-administered medications for this visit.     Past Surgical History  Procedure Laterality Date  . Colonoscopy w/ polypectomy  12-26-2009  . Prostate biopsy    . Coronary artery bypass graft  1997   dr Merleen Nicely    LIMA to LAD  . Coronary artery bypass graft  07-24-2004  dr gerhardt    RIMA to LAD,  SVG to OM,  SVG to PDA,  SVG to RI  . Total hip arthroplasty Left 1983    RECONSTRUCTION/ THA/  SKIN AND BONE GRAFTS--  pt fell from 5 stories off [place]  .  Transthoracic echocardiogram  [id number]    diastolic dysfunction/  ef 45-50%/  mild AV calcification without stenosis,  mild AR/  mild LAE and RAE/  trivial MR and TR    . Cardiovascular stress test  09-09-2013  dr Roderic [facility]    no reversible ischemia,  moderately large defect noted in the inferior wall extending from  mid ventricle to apex also involves true ventricle apex and anteroapical wall/  hypokinesis of inferior wall in region of prior infarct/scarring/  LVEF 49%  . Coronary angioplasty with stent placement  07-17-2004   dr Sabino Snipes    PCI w/ BMS x3 to RCA and balloon pump placement/  LVSF after revascularization , ef 65% w/ mild inferior hypokinesis/  dLM  70% extending into origins of LAD (70% ostial, 80% mid, D1 branch 90% ostial)  CFX (80% ostial OM, 70% after first branch) /   99% mRCA stented and no residual;  remains 80% origin of posterior LV branch/  most apical portion of PDA was embolized   . Cardiac catheterization  08-24-2004  dr Lia Foyer    post re-do CABG/  Unsuccessful attempt PCI of clotted SVG to RI/  anteroapical segment severely hypokinetic,  ef 40-45%/  fresh occlusion SVG to RI,  continued patency of SVG to PAD and RIMA to LAD /   dLM 80%  . Tonsillectomy  age 36  . Appendectomy  1973  . Eye surgery Left 1996    repair ocular injury  . Cataract extraction w/ intraocular lens  implant, bilateral  2006  . Orif right tib-fib fx  1990's  . Radioactive seed implant N/A 05/02/2014    Procedure: RADIOACTIVE SEED IMPLANT;  Surgeon: Irine Seal, MD;  Location: [facility];  Service: Urology;  Laterality: N/A;     Allergies  Allergen Reactions  . Codeine Nausea Only    Pill form of codeine causes nausea;   . Statins Other (See Comments)    myalgia      Family History  Problem Relation Age of Onset  . Clotting disorder Mother   . Heart attack Mother   . Lung cancer Father   . Cancer Brother     prostate     Social History Mr. Strick  reports that he has been smoking Cigarettes.  He has a 55 pack-year smoking history. He has never used smokeless tobacco. Mr. Chelf reports that  he does not drink alcohol.   Review of Systems CONSTITUTIONAL: No weight loss, fever, chills, weakness or fatigue.  HEENT: Eyes: No visual loss, blurred vision, double vision or yellow sclerae.No hearing loss, sneezing, congestion, runny nose or sore throat.  SKIN: No rash or itching.  CARDIOVASCULAR: per HPI RESPIRATORY: No shortness of breath, cough or sputum.  GASTROINTESTINAL: No anorexia, nausea, vomiting or diarrhea. No abdominal pain or blood.  GENITOURINARY: No burning on urination, no polyuria NEUROLOGICAL: No headache, dizziness, syncope, paralysis, ataxia, numbness or tingling in the extremities. No change in bowel or bladder control.  MUSCULOSKELETAL: No muscle, back pain, joint pain or stiffness.  LYMPHATICS: No enlarged nodes. No history of splenectomy.  PSYCHIATRIC: No history of depression or anxiety.  ENDOCRINOLOGIC: No reports of sweating, cold or heat intolerance. No polyuria or polydipsia.  Marland Kitchen   Physical Examination Filed Vitals:   08/13/14 1251  BP: 132/70  Pulse: 67   Filed Vitals:   08/13/14 1251  Height: 5\' 2"  (1.575 m)  Weight: 152 lb (68.947 kg)    Gen: resting comfortably, no acute distress HEENT: no scleral icterus, pupils equal round and reactive, no palptable cervical adenopathy,  CV: RRR, no m/r/g, no JVD Resp: Clear to auscultation bilaterally GI: abdomen is soft, non-tender, non-distended, normal bowel sounds, no hepatosplenomegaly MSK: extremities are warm, no edema.  Skin: warm, no rash Neuro:  no focal deficits Psych: appropriate affect   Diagnostic Studies  02/2013 Echo Study Conclusions  - Study data: Technically difficult study. - Left ventricle: The cavity size was normal. Wall thickness was normal. Systolic function waslow normal tomildly reduced. The estimated ejection fraction was  in the range of 45% to 50%. - Aortic valve: Mildly calcified annulus. Trileaflet; mildly thickened leaflets. Mild regurgitation. Valve area: 2.53cm^2(VTI). Valve area: 2.42cm^2 (Vmax). - Mitral valve: Mildly calcified annulus. Mildly thickened leaflets . - Left atrium: The atrium was mildly dilated. - Right ventricle: The cavity size was mildly dilated. - Right atrium: The atrium was mildly dilated. - Atrial septum: No defect or patent foramen ovale was identified.      Assessment and Plan   1. CAD/ICM - low normal to mildly reduced LVEF by recent echo. No current symptoms, continue current meds  2. HTN  - at goal, continue current meds  3. Hyperlipidemia - intolerant to statins, despite lack of therapy lipid panel looks great.   4. Tobacco abuse - counseled on benefits of cessation, encouraged to quit     F/u 1 year. Repeat lipid panel  Arnoldo Lenis, M.D.

## 2014-08-14 LAB — LIPID PANEL
CHOL/HDL RATIO: 2.5 ratio (ref <=5.0)
Cholesterol: 84 mg/dL — ABNORMAL LOW (ref 125–200)
HDL: 34 mg/dL — AB (ref >=40)
LDL CALC: 35 mg/dL (ref <130)
TRIGLYCERIDES: 77 mg/dL (ref <150)
VLDL: 15 mg/dL (ref <30)

## 2014-08-19 ENCOUNTER — Telehealth: Payer: Self-pay

## 2014-08-19 NOTE — Telephone Encounter (Signed)
-----   Message from Arnoldo Lenis, MD sent at 08/19/2014  1:39 PM EDT ----- Cholesterol test looks good.  Zandra Abts MD

## 2014-08-19 NOTE — Telephone Encounter (Signed)
PT made aware of test results. He voiced understanding.

## 2014-10-29 ENCOUNTER — Other Ambulatory Visit: Payer: Self-pay | Admitting: Cardiology

## 2015-02-12 DIAGNOSIS — C61 Malignant neoplasm of prostate: Secondary | ICD-10-CM | POA: Diagnosis not present

## 2015-03-21 ENCOUNTER — Ambulatory Visit (INDEPENDENT_AMBULATORY_CARE_PROVIDER_SITE_OTHER): Payer: Medicare Other | Admitting: Urology

## 2015-03-21 DIAGNOSIS — C61 Malignant neoplasm of prostate: Secondary | ICD-10-CM

## 2015-03-21 DIAGNOSIS — N400 Enlarged prostate without lower urinary tract symptoms: Secondary | ICD-10-CM | POA: Diagnosis not present

## 2015-03-21 DIAGNOSIS — R3912 Poor urinary stream: Secondary | ICD-10-CM

## 2015-04-07 DIAGNOSIS — H547 Unspecified visual loss: Secondary | ICD-10-CM | POA: Diagnosis not present

## 2015-04-07 DIAGNOSIS — H4611 Retrobulbar neuritis, right eye: Secondary | ICD-10-CM | POA: Diagnosis not present

## 2015-04-07 DIAGNOSIS — Z961 Presence of intraocular lens: Secondary | ICD-10-CM | POA: Diagnosis not present

## 2015-04-30 ENCOUNTER — Other Ambulatory Visit: Payer: Self-pay | Admitting: Cardiology

## 2015-07-07 DIAGNOSIS — C61 Malignant neoplasm of prostate: Secondary | ICD-10-CM | POA: Diagnosis not present

## 2015-07-18 ENCOUNTER — Ambulatory Visit (INDEPENDENT_AMBULATORY_CARE_PROVIDER_SITE_OTHER): Payer: Medicare Other | Admitting: Urology

## 2015-07-18 DIAGNOSIS — C61 Malignant neoplasm of prostate: Secondary | ICD-10-CM | POA: Diagnosis not present

## 2015-07-18 DIAGNOSIS — N401 Enlarged prostate with lower urinary tract symptoms: Secondary | ICD-10-CM | POA: Diagnosis not present

## 2015-09-16 ENCOUNTER — Encounter: Payer: Self-pay | Admitting: Cardiology

## 2015-09-16 ENCOUNTER — Ambulatory Visit (INDEPENDENT_AMBULATORY_CARE_PROVIDER_SITE_OTHER): Payer: Medicare Other | Admitting: Cardiology

## 2015-09-16 VITALS — BP 138/78 | HR 74 | Ht 62.0 in | Wt 155.0 lb

## 2015-09-16 DIAGNOSIS — I2510 Atherosclerotic heart disease of native coronary artery without angina pectoris: Secondary | ICD-10-CM

## 2015-09-16 DIAGNOSIS — I10 Essential (primary) hypertension: Secondary | ICD-10-CM | POA: Diagnosis not present

## 2015-09-16 DIAGNOSIS — E785 Hyperlipidemia, unspecified: Secondary | ICD-10-CM

## 2015-09-16 DIAGNOSIS — R002 Palpitations: Secondary | ICD-10-CM | POA: Diagnosis not present

## 2015-09-16 DIAGNOSIS — Z136 Encounter for screening for cardiovascular disorders: Secondary | ICD-10-CM

## 2015-09-16 MED ORDER — LISINOPRIL 2.5 MG PO TABS: 2.5000 mg | ORAL_TABLET | Freq: Every day | ORAL | 3 refills | Status: DC

## 2015-09-16 MED ORDER — VARENICLINE TARTRATE 0.5 MG X 11 & 1 MG X 42 PO MISC
ORAL | 0 refills | Status: DC
Start: 2015-09-16 — End: 2016-10-01

## 2015-09-16 NOTE — Patient Instructions (Signed)
Medication Instructions:  Start lisinopril 2.5 mg daily  Start chantix Take 0.5 mg daily for 3 days  Take 0.5 mg two times daily for 4 days  Take 1 mg two times daily for 11 weeks   Labwork: Your physician recommends that you return for lab work in: today  Cbc cmet tsh Fasting lipid  Magnesium  a1c   Testing/Procedures: Your physician has requested that you have an abdominal aorta duplex. During this test, an ultrasound is used to evaluate the aorta. Allow 30 minutes for this exam. Do not eat after midnight the day before and avoid carbonated beverages   Follow-Up: Your physician wants you to follow-up in: 1 year.  You will receive a reminder letter in the mail two months in advance. If you don't receive a letter, please call our office to schedule the follow-up appointment.   Any Other Special Instructions Will Be Listed Below (If Applicable).  I have given you a prescription for Chantix & a savings card    If you need a refill on your cardiac medications before your next appointment, please call your pharmacy.

## 2015-09-16 NOTE — Progress Notes (Signed)
Clinical Summary Mr. Gorena is a 70 y.o.male seen today for follow up of the following medical problems.   1. CAD  - CABG in 1997, redo in 2006 as described below.  - ejection fraction in 2006 reported at 40-45%. Repeat echo 02/2013 LVEF 45-50%.    - no recent chest pain. No SOB or DOE. Compliant with meds  2. HTN  - typically around 130s/70s at home - compliant with meds  3. Hyperlipidemia Last panel 08/2014: TC 84 TG 77 HDL 34 LDL 35 - Statins cause headaches and fatigue, currently not on therapy  4. Tobacco - down to 1/2 pack a day. Nicotine patches caused nausea.   5. Prostate CA - followed by urology Past Medical History:  Diagnosis Date  . Arthritis   . ASCVD (arteriosclerotic cardiovascular disease) cardiologist--  is now dr Roderic Palau [place] (was dr [name])    CABG in 1997 -- LIMA to LAD;  cath 07-17-2012 PCI and BMStenting;   RE-DO CABG X4  in  07-24-2004 three-vessel disease; cath in 08-24-2004 post CABG ef 40-45% w/ anteroapical hypokinesis; occlusion SVG  to  RI; patent RIMA to LAD; unsuccessful PCI in totally occluded RI graft.;  last myoview 09-09-2013, no ischemia, moderate inferior wall defect, ef 49%  . Chronic obstructive pulmonary disease ([place])   . Diverticulosis of colon   . Euthyroid goiter    with low TSH   . History of colon polyps    hyperplastic 2011  . History of diverticulitis   . History of kidney stones   . History of ST elevation myocardial infarction (STEMI)    07-17-2004   w/ extensive RV infact per cath report  . HOH (hard of hearing)    no hearing aid  . Hypertension   . Low TSH level    Clinically euthyroid  . Peripheral vascular disease (HCC)    Decreased distal pulses and abnormal Doppler  . Prostate cancer [facility]) urologist--  dr Jenny Reichmann wrenn   T1c, Gleason 3+4=7,  PSA 4.71,  vol 38cc  . Ptosis    Left, secondary to injury  . S/p bare metal coronary artery stent    x3  to RCA  07-17-2004     Allergies  Allergen  Reactions  . Codeine Nausea Only    Pill form of codeine causes nausea;   . Statins Other (See Comments)    myalgia     Current Outpatient Prescriptions  Medication Sig Dispense Refill  . aspirin EC 81 MG tablet Take 81 mg by mouth 2 (two) times daily.    . carvedilol (COREG) 3.125 MG tablet TAKE 1 TABLET BY MOUTH TWICE DAILY. 180 tablet 3  . ciprofloxacin (CIPRO) 500 MG tablet Take 1 tablet (500 mg total) by mouth 2 (two) times daily. 6 tablet 0  . clotrimazole-betamethasone (LOTRISONE) cream     . fluticasone (FLONASE) 50 MCG/ACT nasal spray Place into both nostrils daily as needed.     Marland Kitchen HYDROcodone-acetaminophen (NORCO) 5-325 MG per tablet Take 1 tablet by mouth every 6 (six) hours as needed for moderate pain. 20 tablet 0  . mupirocin ointment (BACTROBAN) 2 %     . nitroGLYCERIN (NITROSTAT) 0.4 MG SL tablet Place 0.4 mg under the tongue every 5 (five) minutes as needed for chest pain.    . Omega-3 Fatty Acids (FISH OIL) 300 MG CAPS Take 2 capsules by mouth 2 (two) times daily.      . promethazine (PHENERGAN) 25 MG tablet Take 1 tablet (  25 mg total) by mouth every 6 (six) hours as needed for nausea or vomiting. 15 tablet 0  . tamsulosin (FLOMAX) 0.4 MG CAPS capsule Take 1 capsule (0.4 mg total) by mouth daily. 30 capsule 1  . valACYclovir (VALTREX) 1000 MG tablet      No current facility-administered medications for this visit.      Past Surgical History:  Procedure Laterality Date  . APPENDECTOMY  1973  . CARDIAC CATHETERIZATION  08-24-2004  dr Lia Foyer   post re-do CABG/  Unsuccessful attempt PCI of clotted SVG to RI/  anteroapical segment severely hypokinetic,  ef 40-45%/  fresh occlusion SVG to RI,  continued patency of SVG to PAD and RIMA to LAD /   dLM 80%  . CARDIOVASCULAR STRESS TEST  09-09-2013  dr Roderic Palau [place]   no reversible ischemia,  moderately large defect noted in the inferior wall extending from  mid ventricle to apex also involves true ventricle apex and  anteroapical wall/  hypokinesis of inferior wall in region of prior infarct/scarring/  LVEF 49%  . CATARACT EXTRACTION W/ INTRAOCULAR LENS  IMPLANT, BILATERAL  2006  . COLONOSCOPY W/ POLYPECTOMY  12-26-2009  . CORONARY ANGIOPLASTY WITH STENT PLACEMENT  07-17-2004   dr Sabino Snipes   PCI w/ BMS x3 to RCA and balloon pump placement/  LVSF after revascularization , ef 65% w/ mild inferior hypokinesis/  dLM  70% extending into origins of LAD (70% ostial, 80% mid, D1 Dilyn Osoria 90% ostial)  CFX (80% ostial OM, 70% after first Sunday Klos) /   99% mRCA stented and no residual;  remains 80% origin of posterior LV Kalia Vahey/  most apical portion of PDA was embolized   . CORONARY ARTERY BYPASS GRAFT  1997   dr Merleen Nicely   LIMA to LAD  . CORONARY ARTERY BYPASS GRAFT  07-24-2004  dr gerhardt   RIMA to LAD,  SVG to OM,  SVG to PDA,  SVG to RI  . EYE SURGERY Left 1996   repair ocular injury  . ORIF RIGHT TIB-FIB FX  1990's  . PROSTATE BIOPSY    . RADIOACTIVE SEED IMPLANT N/A 05/02/2014   Procedure: RADIOACTIVE SEED IMPLANT;  Surgeon: Irine Seal, MD;  Location: [facility];  Service: Urology;  Laterality: N/A;  . TONSILLECTOMY  age 58  . TOTAL HIP ARTHROPLASTY Left 1983   RECONSTRUCTION/ THA/  SKIN AND BONE GRAFTS--  pt fell from 5 stories off [place]  . TRANSTHORACIC ECHOCARDIOGRAM  0000000   diastolic dysfunction/  ef 45-50%/  mild AV calcification without stenosis,  mild AR/  mild LAE and RAE/  trivial MR and TR       Allergies  Allergen Reactions  . Codeine Nausea Only    Pill form of codeine causes nausea;   . Statins Other (See Comments)    myalgia      Family History  Problem Relation Age of Onset  . Clotting disorder Mother   . Heart attack Mother   . Lung cancer Father   . Cancer Brother     prostate     Social History Mr. Salaiz reports that he has been smoking Cigarettes.  He started smoking about 57 years ago. He has a 55.00 pack-year smoking history. He has  never used smokeless tobacco. Mr. Mckeone reports that he does not drink alcohol.   Review of Systems CONSTITUTIONAL: No weight loss, fever, chills, weakness or fatigue.  HEENT: Eyes: No visual loss, blurred vision, double vision or yellow sclerae.No  hearing loss, sneezing, congestion, runny nose or sore throat.  SKIN: No rash or itching.  CARDIOVASCULAR: per HPI RESPIRATORY: No shortness of breath, cough or sputum.  GASTROINTESTINAL: No anorexia, nausea, vomiting or diarrhea. No abdominal pain or blood.  GENITOURINARY: No burning on urination, no polyuria NEUROLOGICAL: No headache, dizziness, syncope, paralysis, ataxia, numbness or tingling in the extremities. No change in bowel or bladder control.  MUSCULOSKELETAL: No muscle, back pain, joint pain or stiffness.  LYMPHATICS: No enlarged nodes. No history of splenectomy.  PSYCHIATRIC: No history of depression or anxiety.  ENDOCRINOLOGIC: No reports of sweating, cold or heat intolerance. No polyuria or polydipsia.  Marland Kitchen   Physical Examination  Filed Weights   09/16/15 0808  Weight: 155 lb (70.3 kg)   Vitals:   09/16/15 0808  BP: 138/78  Pulse: 74   Vitals:   09/16/15 0808  Weight: 155 lb (70.3 kg)  Height: 5\' 2"  (1.575 m)    Gen: resting comfortably, no acute distress HEENT: no scleral icterus, pupils equal round and reactive, no palptable cervical adenopathy,  CV: RRR, no m/r/g, no jvd Resp: Clear to auscultation bilaterally GI: abdomen is soft, non-tender, non-distended, normal bowel sounds, no hepatosplenomegaly MSK: extremities are warm, no edema.  Skin: warm, no rash Neuro:  no focal deficits Psych: appropriate affect   Diagnostic Studies 02/2013 Echo Study Conclusions  - Study data: Technically difficult study. - Left ventricle: The cavity size was normal. Wall thickness was normal. Systolic function waslow normal tomildly reduced. The estimated ejection fraction was in the range of 45% to 50%. - Aortic  valve: Mildly calcified annulus. Trileaflet; mildly thickened leaflets. Mild regurgitation. Valve area: 2.53cm^2(VTI). Valve area: 2.42cm^2 (Vmax). - Mitral valve: Mildly calcified annulus. Mildly thickened leaflets . - Left atrium: The atrium was mildly dilated. - Right ventricle: The cavity size was mildly dilated. - Right atrium: The atrium was mildly dilated. - Atrial septum: No defect or patent foramen ovale was identified.    Assessment and Plan  1. CAD/ICM - no current symptoms, continue current meds - we will start low dose ACE-I in setting of CAD - EKG in clinic wihtout acute ischemic changes  2. HTN  - at goal, he will continue current meds  3. Hyperlipidemia - intolerant to statins, despite lack of therapy lipids look good.Contniue to monitor  4. Tobacco abuse - encouraged to quit, he will try chantix.    5. AAA screen - male over the age of 46 with smoking history, will obtain AAA screening US  F/u 1 year. Repeat labs      Arnoldo Lenis, M.D.

## 2015-10-01 DIAGNOSIS — E782 Mixed hyperlipidemia: Secondary | ICD-10-CM | POA: Diagnosis not present

## 2015-10-01 DIAGNOSIS — K5732 Diverticulitis of large intestine without perforation or abscess without bleeding: Secondary | ICD-10-CM | POA: Diagnosis not present

## 2015-10-01 DIAGNOSIS — I10 Essential (primary) hypertension: Secondary | ICD-10-CM | POA: Diagnosis not present

## 2015-10-01 DIAGNOSIS — Z1389 Encounter for screening for other disorder: Secondary | ICD-10-CM | POA: Diagnosis not present

## 2015-10-01 DIAGNOSIS — R3129 Other microscopic hematuria: Secondary | ICD-10-CM | POA: Diagnosis not present

## 2015-10-01 DIAGNOSIS — I7389 Other specified peripheral vascular diseases: Secondary | ICD-10-CM | POA: Diagnosis not present

## 2015-10-01 DIAGNOSIS — J449 Chronic obstructive pulmonary disease, unspecified: Secondary | ICD-10-CM | POA: Diagnosis not present

## 2015-10-06 ENCOUNTER — Ambulatory Visit (HOSPITAL_COMMUNITY)
Admission: RE | Admit: 2015-10-06 | Discharge: 2015-10-06 | Disposition: A | Discharge status: Home / Self Care | Payer: Medicare Other | Source: Ambulatory Visit | Attending: Cardiology | Admitting: Cardiology

## 2015-10-06 DIAGNOSIS — Z136 Encounter for screening for cardiovascular disorders: Secondary | ICD-10-CM | POA: Diagnosis not present

## 2015-10-16 DIAGNOSIS — K4090 Unilateral inguinal hernia, without obstruction or gangrene, not specified as recurrent: Secondary | ICD-10-CM | POA: Diagnosis not present

## 2015-10-16 DIAGNOSIS — I7389 Other specified peripheral vascular diseases: Secondary | ICD-10-CM | POA: Diagnosis not present

## 2015-10-16 DIAGNOSIS — I10 Essential (primary) hypertension: Secondary | ICD-10-CM | POA: Diagnosis not present

## 2015-10-20 DIAGNOSIS — C61 Malignant neoplasm of prostate: Secondary | ICD-10-CM | POA: Diagnosis not present

## 2015-10-24 ENCOUNTER — Ambulatory Visit (INDEPENDENT_AMBULATORY_CARE_PROVIDER_SITE_OTHER): Payer: Medicare Other | Admitting: Urology

## 2015-10-24 DIAGNOSIS — Z8546 Personal history of malignant neoplasm of prostate: Secondary | ICD-10-CM

## 2015-10-24 DIAGNOSIS — N401 Enlarged prostate with lower urinary tract symptoms: Secondary | ICD-10-CM

## 2015-10-24 DIAGNOSIS — R3912 Poor urinary stream: Secondary | ICD-10-CM | POA: Diagnosis not present

## 2015-10-28 DIAGNOSIS — R109 Unspecified abdominal pain: Secondary | ICD-10-CM | POA: Diagnosis not present

## 2016-04-07 ENCOUNTER — Emergency Department (HOSPITAL_COMMUNITY)
Admission: EM | Admit: 2016-04-07 | Discharge: 2016-04-08 | Disposition: A | Discharge status: Home / Self Care | Payer: Medicare Other | Attending: Physician Assistant | Admitting: Physician Assistant

## 2016-04-07 ENCOUNTER — Encounter (HOSPITAL_COMMUNITY): Payer: Self-pay | Admitting: Emergency Medicine

## 2016-04-07 DIAGNOSIS — I25810 Atherosclerosis of coronary artery bypass graft(s) without angina pectoris: Secondary | ICD-10-CM | POA: Insufficient documentation

## 2016-04-07 DIAGNOSIS — F17210 Nicotine dependence, cigarettes, uncomplicated: Secondary | ICD-10-CM | POA: Diagnosis not present

## 2016-04-07 DIAGNOSIS — Z955 Presence of coronary angioplasty implant and graft: Secondary | ICD-10-CM | POA: Diagnosis not present

## 2016-04-07 DIAGNOSIS — Z951 Presence of aortocoronary bypass graft: Secondary | ICD-10-CM | POA: Insufficient documentation

## 2016-04-07 DIAGNOSIS — I10 Essential (primary) hypertension: Secondary | ICD-10-CM | POA: Diagnosis not present

## 2016-04-07 DIAGNOSIS — Z8546 Personal history of malignant neoplasm of prostate: Secondary | ICD-10-CM | POA: Diagnosis not present

## 2016-04-07 DIAGNOSIS — J449 Chronic obstructive pulmonary disease, unspecified: Secondary | ICD-10-CM | POA: Insufficient documentation

## 2016-04-07 DIAGNOSIS — I110 Hypertensive heart disease with heart failure: Secondary | ICD-10-CM | POA: Diagnosis not present

## 2016-04-07 DIAGNOSIS — I499 Cardiac arrhythmia, unspecified: Secondary | ICD-10-CM | POA: Diagnosis not present

## 2016-04-07 DIAGNOSIS — Z96642 Presence of left artificial hip joint: Secondary | ICD-10-CM | POA: Insufficient documentation

## 2016-04-07 DIAGNOSIS — Z7982 Long term (current) use of aspirin: Secondary | ICD-10-CM | POA: Diagnosis not present

## 2016-04-07 DIAGNOSIS — R112 Nausea with vomiting, unspecified: Secondary | ICD-10-CM

## 2016-04-07 DIAGNOSIS — I509 Heart failure, unspecified: Secondary | ICD-10-CM | POA: Diagnosis not present

## 2016-04-07 DIAGNOSIS — H8111 Benign paroxysmal vertigo, right ear: Secondary | ICD-10-CM

## 2016-04-07 DIAGNOSIS — R001 Bradycardia, unspecified: Secondary | ICD-10-CM | POA: Diagnosis not present

## 2016-04-07 DIAGNOSIS — R42 Dizziness and giddiness: Secondary | ICD-10-CM | POA: Diagnosis present

## 2016-04-07 DIAGNOSIS — I252 Old myocardial infarction: Secondary | ICD-10-CM | POA: Diagnosis not present

## 2016-04-07 DIAGNOSIS — Z79899 Other long term (current) drug therapy: Secondary | ICD-10-CM | POA: Diagnosis not present

## 2016-04-07 LAB — BASIC METABOLIC PANEL
Anion gap: 9 (ref 5–15)
BUN: 17 mg/dL (ref 6–20)
CO2: 21 mmol/L — ABNORMAL LOW (ref 22–32)
Calcium: 8.9 mg/dL (ref 8.9–10.3)
Chloride: 109 mmol/L (ref 101–111)
Creatinine, Ser: 0.95 mg/dL (ref 0.61–1.24)
GFR calc Af Amer: >60 mL/min (ref >60)
GFR calc non Af Amer: >60 mL/min (ref >60)
GLUCOSE: 135 mg/dL — AB (ref 65–99)
Potassium: 5 mmol/L (ref 3.5–5.1)
Sodium: 139 mmol/L (ref 135–145)

## 2016-04-07 LAB — CBC
HEMATOCRIT: 49.7 % (ref 39.0–52.0)
Hemoglobin: 16.6 g/dL (ref 13.0–17.0)
MCH: 28.1 pg (ref 26.0–34.0)
MCHC: 33.4 g/dL (ref 30.0–36.0)
MCV: 84.1 fL (ref 78.0–100.0)
Platelets: 163 10*3/uL (ref 150–400)
RBC: 5.91 MIL/uL — AB (ref 4.22–5.81)
RDW: 15 % (ref 11.5–15.5)
WBC: 11.9 10*3/uL — ABNORMAL HIGH (ref 4.0–10.5)

## 2016-04-07 MED ORDER — MECLIZINE HCL 25 MG PO TABS
25.0000 mg | ORAL_TABLET | Freq: Once | ORAL | Status: AC
  Administered 2016-04-07: 25 mg via ORAL
  Filled 2016-04-07: qty 1

## 2016-04-07 MED ORDER — ONDANSETRON HCL 4 MG/2ML IJ SOLN
4.0000 mg | Freq: Once | INTRAMUSCULAR | Status: AC
  Administered 2016-04-07: 4 mg via INTRAVENOUS
  Filled 2016-04-07: qty 2

## 2016-04-07 NOTE — ED Provider Notes (Signed)
[facility] DEPT Provider Note   CSN: [id number] Arrival date & time: 04/07/16  2132     History   Chief Complaint Chief Complaint  Patient presents with  . Emesis    HPI Vincent Carter is a 70 y.o. male.  HPI  71 year old male with history of CAD status post CABG in 1997, redo of his CABG in 2006, with multiple stents in place, CHF with LVEF of 40%, COPD (not on home O2), who presents with sudden onset dizziness described as the room spinning as well as vomiting. Patient reports the symptoms began approximately 2 hours ago. Vomiting is accompanied by diaphoresis. He denies any chest pain, fatigue, focal weakness, or shortness of breath. Dizziness occurs with turning of the head from side to side, but is not present at rest. Denies neck pain. States that he has had vertigo in the past but it was decades ago. Denies blurry vision. No prior history of stroke. States that he felt well prior to onset of symptoms. No acute hearing loss or tinnitus. Other than head movement, there are no other aggravating or relieving factors or associated symptoms.  Past Medical History:  Diagnosis Date  . Arthritis   . ASCVD (arteriosclerotic cardiovascular disease) cardiologist--  is now dr Roderic Palau branch (was dr [name])    CABG in 1997 -- LIMA to LAD;  cath 07-17-2012 PCI and BMStenting;   RE-DO CABG X4  in  07-24-2004 three-vessel disease; cath in 08-24-2004 post CABG ef 40-45% w/ anteroapical hypokinesis; occlusion SVG  to  RI; patent RIMA to LAD; unsuccessful PCI in totally occluded RI graft.;  last myoview 09-09-2013, no ischemia, moderate inferior wall defect, ef 49%  . Chronic obstructive pulmonary disease ([place])   . Diverticulosis of colon   . Euthyroid goiter    with low TSH   . History of colon polyps    hyperplastic 2011  . History of diverticulitis   . History of kidney stones   . History of ST elevation myocardial infarction (STEMI)    07-17-2004   w/ extensive RV infact per cath  report  . HOH (hard of hearing)    no hearing aid  . Hypertension   . Low TSH level    Clinically euthyroid  . Peripheral vascular disease (HCC)    Decreased distal pulses and abnormal Doppler  . Prostate cancer [facility]) urologist--  dr Jenny Reichmann wrenn   T1c, Gleason 3+4=7,  PSA 4.71,  vol 38cc  . Ptosis    Left, secondary to injury  . S/p bare metal coronary artery stent    x3  to RCA  07-17-2004    Patient Active Problem List   Diagnosis Date Noted  . Malignant neoplasm of prostate ([place]) 03/05/2014  . Acute chest pain 09/08/2013  . Vertigo 09/08/2013  . Bigeminal rhythm 09/08/2013  . ASCVD (arteriosclerotic cardiovascular disease)   . Tobacco abuse, in remission   . Low TSH level   . Peripheral vascular disease ([place])   . Colonic polyp 12/29/2009  . Hypertension 12/09/2008  . Chronic obstructive pulmonary disease ([place]) 12/09/2008    Past Surgical History:  Procedure Laterality Date  . APPENDECTOMY  1973  . CARDIAC CATHETERIZATION  08-24-2004  dr Lia Foyer   post re-do CABG/  Unsuccessful attempt PCI of clotted SVG to RI/  anteroapical segment severely hypokinetic,  ef 40-45%/  fresh occlusion SVG to RI,  continued patency of SVG to PAD and RIMA to LAD /   dLM 80%  . CARDIOVASCULAR STRESS TEST  09-09-2013  dr Roderic Palau branch   no reversible ischemia,  moderately large defect noted in the inferior wall extending from  mid ventricle to apex also involves true ventricle apex and anteroapical wall/  hypokinesis of inferior wall in region of prior infarct/scarring/  LVEF 49%  . CATARACT EXTRACTION W/ INTRAOCULAR LENS  IMPLANT, BILATERAL  2006  . COLONOSCOPY W/ POLYPECTOMY  12-26-2009  . CORONARY ANGIOPLASTY WITH STENT PLACEMENT  07-17-2004   dr Sabino Snipes   PCI w/ BMS x3 to RCA and balloon pump placement/  LVSF after revascularization , ef 65% w/ mild inferior hypokinesis/  dLM  70% extending into origins of LAD (70% ostial, 80% mid, D1 branch 90% ostial)  CFX (80% ostial OM, 70% after  first branch) /   99% mRCA stented and no residual;  remains 80% origin of posterior LV branch/  most apical portion of PDA was embolized   . CORONARY ARTERY BYPASS GRAFT  1997   dr Merleen Nicely   LIMA to LAD  . CORONARY ARTERY BYPASS GRAFT  07-24-2004  dr gerhardt   RIMA to LAD,  SVG to OM,  SVG to PDA,  SVG to RI  . EYE SURGERY Left 1996   repair ocular injury  . ORIF RIGHT TIB-FIB FX  1990's  . PROSTATE BIOPSY    . RADIOACTIVE SEED IMPLANT N/A 05/02/2014   Procedure: RADIOACTIVE SEED IMPLANT;  Surgeon: Irine Seal, MD;  Location: [facility];  Service: Urology;  Laterality: N/A;  . TONSILLECTOMY  age 17  . TOTAL HIP ARTHROPLASTY Left 1983   RECONSTRUCTION/ THA/  SKIN AND BONE GRAFTS--  pt fell from 5 stories off [place]  . TRANSTHORACIC ECHOCARDIOGRAM  [id number]   diastolic dysfunction/  ef 45-50%/  mild AV calcification without stenosis,  mild AR/  mild LAE and RAE/  trivial MR and TR         Home Medications    Prior to Admission medications   Medication Sig Start Date End Date Taking? Authorizing Provider  aspirin EC 81 MG tablet Take 81 mg by mouth 2 (two) times daily.   Yes Historical Provider, MD  carvedilol (COREG) 3.125 MG tablet TAKE 1 TABLET BY MOUTH TWICE DAILY. 04/30/15  Yes Arnoldo Lenis, MD  lisinopril (PRINIVIL,ZESTRIL) 2.5 MG tablet Take 1 tablet (2.5 mg total) by mouth daily. 09/16/15  Yes Arnoldo Lenis, MD  naproxen sodium (ANAPROX) 220 MG tablet Take 220 mg by mouth 2 (two) times daily as needed (for pain).   Yes Historical Provider, MD  Omega-3 Fatty Acids (FISH OIL) 300 MG CAPS Take 2 capsules by mouth 2 (two) times daily.     Yes Historical Provider, MD  ciprofloxacin (CIPRO) 500 MG tablet Take 1 tablet (500 mg total) by mouth 2 (two) times daily. Patient not taking: Reported on 04/07/2016 05/02/14   Irine Seal, MD  HYDROcodone-acetaminophen [facility]) 5-325 MG per tablet Take 1 tablet by mouth every 6 (six) hours as needed for moderate  pain. Patient not taking: Reported on 04/07/2016 05/02/14   Irine Seal, MD  meclizine (ANTIVERT) 12.5 MG tablet Take 1 tablet (12.5 mg total) by mouth 3 (three) times daily as needed for dizziness. 04/08/16 04/11/16  Damyra Luscher Algernon Huxley, MD  nitroGLYCERIN (NITROSTAT) 0.4 MG SL tablet Place 0.4 mg under the tongue every 5 (five) minutes as needed for chest pain.    Historical Provider, MD  ondansetron (ZOFRAN ODT) 4 MG disintegrating tablet Take 1 tablet (4 mg total) by mouth every  8 (eight) hours as needed for nausea or vomiting. 04/08/16   Ustin Cruickshank Algernon Huxley, MD  promethazine (PHENERGAN) 25 MG tablet Take 1 tablet (25 mg total) by mouth every 6 (six) hours as needed for nausea or vomiting. Patient not taking: Reported on 04/07/2016 05/02/14   Irine Seal, MD  tamsulosin (FLOMAX) 0.4 MG CAPS capsule Take 1 capsule (0.4 mg total) by mouth daily. Patient not taking: Reported on 04/07/2016 05/02/14   Irine Seal, MD  varenicline (CHANTIX STARTING MONTH PAK) 0.5 MG X 11 & 1 MG X 42 tablet Take one 0.5 mg tablet by mouth once daily for 3 days, then increase to one 0.5 mg tablet twice daily for 4 days, then increase to one 1 mg tablet twice daily. Patient not taking: Reported on 04/07/2016 09/16/15   Arnoldo Lenis, MD    Family History Family History  Problem Relation Age of Onset  . Clotting disorder Mother   . Heart attack Mother   . Lung cancer Father   . Cancer Brother     prostate    Social History Social History  Substance Use Topics  . Smoking status: Current Every Day Smoker    Packs/day: 1.00    Years: 55.00    Types: Cigarettes    Start date: 08/13/1958  . Smokeless tobacco: Never Used  . Alcohol use No     Allergies   Codeine and Statins   Review of Systems Review of Systems  Constitutional: Negative for chills and fever.  HENT: Negative for congestion.   Eyes: Negative for visual disturbance.  Respiratory: Negative for cough and shortness of breath.   Cardiovascular:  Negative for chest pain and palpitations.  Gastrointestinal: Positive for nausea and vomiting. Negative for abdominal pain and diarrhea.  Genitourinary: Negative for dysuria and frequency.  Musculoskeletal: Negative for arthralgias, back pain, myalgias, neck pain and neck stiffness.  Skin: Negative for rash.  Neurological: Positive for dizziness. Negative for seizures, syncope, facial asymmetry, speech difficulty, weakness, light-headedness, numbness and headaches.  Psychiatric/Behavioral: Negative for agitation, behavioral problems and confusion.     Physical Exam Updated Vital Signs BP (!) 150/81   Pulse (!) 58   Temp 97.4 F (36.3 C) (Oral)   Resp (!) 22   SpO2 95%   Physical Exam  Constitutional: He is oriented to person, place, and time. He appears well-developed and well-nourished. No distress.  HENT:  Head: Normocephalic and atraumatic.  Right Ear: External ear normal.  Left Ear: External ear normal.  Nose: Nose normal.  Mouth/Throat: Oropharynx is clear and moist. No oropharyngeal exudate.  TMs normal in appearance bilaterally  Eyes: Conjunctivae are normal. Pupils are equal, round, and reactive to light. Right eye exhibits no discharge. Left eye exhibits no discharge. No scleral icterus.  Neck: Normal range of motion. Neck supple.  Cardiovascular: Normal rate, regular rhythm, normal heart sounds and intact distal pulses.  Exam reveals no gallop and no friction rub.   No murmur heard. Pulmonary/Chest: Effort normal and breath sounds normal. No respiratory distress. He has no wheezes. He has no rales.  Abdominal: Soft. Bowel sounds are normal. He exhibits no distension. There is no tenderness. There is no guarding.  Musculoskeletal: Normal range of motion. He exhibits no edema or tenderness.  Neurological: He is alert and oriented to person, place, and time. No cranial nerve deficit. He exhibits normal muscle tone.  Face symmetric, tongue midline. 5/5 strength in the  proximal and distal upper and lower extremities bilaterally, with intact sensation  to light touch. Normal finger to nose, heel to shin, and rapid alternating movements. Normal speech. Normal gait. Dix-hallpike positive on the R.    Skin: Skin is warm and dry. No rash noted. He is not diaphoretic.  Psychiatric: He has a normal mood and affect. His behavior is normal. Judgment and thought content normal.     ED Treatments / Results  Labs (all labs ordered are listed, but only abnormal results are displayed) Labs Reviewed  CBC - Abnormal; Notable for the following:       Result Value   WBC 11.9 (*)    RBC 5.91 (*)    All other components within normal limits  BASIC METABOLIC PANEL - Abnormal; Notable for the following:    CO2 21 (*)    Glucose, Bld 135 (*)    All other components within normal limits  I-STAT TROPOININ, ED    EKG  EKG Interpretation  Date/Time:  Wednesday April 07 2016 21:35:40 EDT Ventricular Rate:  60 PR Interval:  172 QRS Duration: 86 QT Interval:  448 QTC Calculation: 448 R Axis:   84 Text Interpretation:  Marked sinus bradycardia with frequent Premature ventricular complexes Septal infarct , age undetermined ST & T wave abnormality, consider inferior ischemia Abnormal ECG Since last tracing rate slower Confirmed by Gerald Leitz ([id number]) on 04/07/2016 11:48:46 PM       Radiology No results found.  Procedures Procedures (including critical care time)  Medications Ordered in ED Medications  ondansetron (ZOFRAN) injection 4 mg (4 mg Intravenous Given 04/07/16 2224)  meclizine (ANTIVERT) tablet 25 mg (25 mg Oral Given 04/07/16 2224)  ondansetron (ZOFRAN-ODT) disintegrating tablet 4 mg (4 mg Oral Given 04/08/16 0036)     Initial Impression / Assessment and Plan / ED Course  I have reviewed the triage vital signs and the nursing notes.  Pertinent labs & imaging results that were available during my care of the patient were reviewed by me and  considered in my medical decision making (see chart for details).     Patient initially bradycardic with heart rate in the high 40s, but is normotensive. Heart rate improved to the mid to high 60s without intervention. Reports dizziness with turning the head from side to side. Neuro exam nonfocal as above. Given positional nature of symptoms with alleviation at rest, I have a low suspicion for central pathology and suspect benign paroxysmal positional vertigo.  EKG with T wave inversions in the inferior and anterior leads that are unchanged from prior. Troponin obtained 3 hours after onset of symptoms that is not elevated and I doubt cardiac ischemia. CBC with mild leukocytosis to 11.9. Patient denies infectious symptoms. BMP with normal electrolytes.  Patient given Zofran and meclizine, and on reassessment reports that his symptoms have greatly improved. He is able to ambulate without assistance. He has a primary care doctor that he can follow up with within the next 3 days if symptoms have not improved. Prescription written for Zofran as well as meclizine. Patient given strict return precautions for neurologic deficits, chest pain, shortness of breath. He was discharged in good condition.  Care of patient overseen by my attending, Dr. Thomasene Lot.  Final Clinical Impressions(s) / ED Diagnoses   Final diagnoses:  Benign paroxysmal positional vertigo of right ear  Non-intractable vomiting with nausea, unspecified vomiting type    New Prescriptions Discharge Medication List as of 04/08/2016 12:29 AM    START taking these medications   Details  meclizine (ANTIVERT) 12.5 MG tablet Take  1 tablet (12.5 mg total) by mouth 3 (three) times daily as needed for dizziness., Starting Thu 04/08/2016, Until Sun 04/11/2016, Print    ondansetron (ZOFRAN ODT) 4 MG disintegrating tablet Take 1 tablet (4 mg total) by mouth every 8 (eight) hours as needed for nausea or vomiting., Starting Thu 04/08/2016, Print          Resa Rinks Algernon Huxley, MD 04/08/16 0131    Courteney Julio Alm, MD 04/10/16 [id number]

## 2016-04-07 NOTE — ED Notes (Signed)
Pt states his dizziness/lightheadedness has improved after receiving meclizine.

## 2016-04-07 NOTE — ED Triage Notes (Signed)
Per EMS, pt from home with sudden onset diaphoresis, n/v, starting at 2015 tonight. Pt has extensive cardiac hx, denies chest pain. EMS reports initial HR fluctuating between 35-60 bpm. BP-148/90, CBG-130. Given 4mg  zofran PTA

## 2016-04-08 LAB — I-STAT TROPONIN, ED: Troponin i, poc: 0 ng/mL (ref 0.00–0.08)

## 2016-04-08 MED ORDER — ONDANSETRON 4 MG PO TBDP
4.0000 mg | ORAL_TABLET | Freq: Once | ORAL | Status: AC
  Administered 2016-04-08: 4 mg via ORAL
  Filled 2016-04-08: qty 1

## 2016-04-08 MED ORDER — MECLIZINE HCL 12.5 MG PO TABS: 12.5000 mg | ORAL_TABLET | Freq: Three times a day (TID) | ORAL | 0 refills | Status: AC | PRN

## 2016-04-08 MED ORDER — ONDANSETRON 4 MG PO TBDP: 4.0000 mg | ORAL_TABLET | Freq: Three times a day (TID) | ORAL | 0 refills | Status: DC | PRN

## 2016-04-08 NOTE — ED Notes (Signed)
I-Stat Troponin: 0.00 

## 2016-04-09 DIAGNOSIS — Z1389 Encounter for screening for other disorder: Secondary | ICD-10-CM | POA: Diagnosis not present

## 2016-04-09 DIAGNOSIS — C61 Malignant neoplasm of prostate: Secondary | ICD-10-CM | POA: Diagnosis not present

## 2016-04-09 DIAGNOSIS — I10 Essential (primary) hypertension: Secondary | ICD-10-CM | POA: Diagnosis not present

## 2016-04-26 ENCOUNTER — Other Ambulatory Visit: Payer: Self-pay | Admitting: Cardiology

## 2016-05-28 ENCOUNTER — Ambulatory Visit (INDEPENDENT_AMBULATORY_CARE_PROVIDER_SITE_OTHER): Payer: Medicare Other | Admitting: Urology

## 2016-05-28 DIAGNOSIS — N400 Enlarged prostate without lower urinary tract symptoms: Secondary | ICD-10-CM | POA: Diagnosis not present

## 2016-05-28 DIAGNOSIS — Z8546 Personal history of malignant neoplasm of prostate: Secondary | ICD-10-CM | POA: Diagnosis not present

## 2016-10-01 ENCOUNTER — Encounter: Payer: Self-pay | Admitting: Cardiology

## 2016-10-01 ENCOUNTER — Ambulatory Visit (INDEPENDENT_AMBULATORY_CARE_PROVIDER_SITE_OTHER): Payer: Medicare Other | Admitting: Cardiology

## 2016-10-01 VITALS — BP 180/88 | HR 75 | Ht 62.0 in | Wt 154.0 lb

## 2016-10-01 DIAGNOSIS — I10 Essential (primary) hypertension: Secondary | ICD-10-CM | POA: Diagnosis not present

## 2016-10-01 DIAGNOSIS — E782 Mixed hyperlipidemia: Secondary | ICD-10-CM

## 2016-10-01 DIAGNOSIS — I255 Ischemic cardiomyopathy: Secondary | ICD-10-CM

## 2016-10-01 DIAGNOSIS — Z79899 Other long term (current) drug therapy: Secondary | ICD-10-CM | POA: Diagnosis not present

## 2016-10-01 DIAGNOSIS — I2510 Atherosclerotic heart disease of native coronary artery without angina pectoris: Secondary | ICD-10-CM

## 2016-10-01 MED ORDER — NITROGLYCERIN 0.4 MG SL SUBL: 0.4000 mg | SUBLINGUAL_TABLET | SUBLINGUAL | 3 refills | Status: DC | PRN

## 2016-10-01 NOTE — Progress Notes (Signed)
Clinical Summary Vincent Carter is a 71 y.o.male seen today for follow up of the following medical problems.   1. CAD  - CABG in 1997, redo in 2006 as described below.  - ejection fraction in 2006 reported at 40-45%. Repeat echo 02/2013 LVEF 45-50%.    - denies any chest pain, no SOB/DOE - compliant with meds   2. HTN  - typically around 130s/70s at home - compliant with meds  - checks at home 2-3 times a week. 120-130/70s   3. Hyperlipidemia - Statins cause headaches and fatigue, currently not on therapy - no recent labs in our system.    4. AAA screen - negatvie [place] 09/2015 Past Medical History:  Diagnosis Date  . Arthritis   . ASCVD (arteriosclerotic cardiovascular disease) cardiologist--  is now dr Roderic Palau branch (was dr [name])    CABG in 1997 -- LIMA to LAD;  cath 07-17-2012 PCI and BMStenting;   RE-DO CABG X4  in  07-24-2004 three-vessel disease; cath in 08-24-2004 post CABG ef 40-45% w/ anteroapical hypokinesis; occlusion SVG  to  RI; patent RIMA to LAD; unsuccessful PCI in totally occluded RI graft.;  last myoview 09-09-2013, no ischemia, moderate inferior wall defect, ef 49%  . Chronic obstructive pulmonary disease ([place])   . Diverticulosis of colon   . Euthyroid goiter    with low TSH   . History of colon polyps    hyperplastic 2011  . History of diverticulitis   . History of kidney stones   . History of ST elevation myocardial infarction (STEMI)    07-17-2004   w/ extensive RV infact per cath report  . HOH (hard of hearing)    no hearing aid  . Hypertension   . Low TSH level    Clinically euthyroid  . Peripheral vascular disease (HCC)    Decreased distal pulses and abnormal Doppler  . Prostate cancer [facility]) urologist--  dr Jenny Reichmann wrenn   T1c, Gleason 3+4=7,  PSA 4.71,  vol 38cc  . Ptosis    Left, secondary to injury  . S/p bare metal coronary artery stent    x3  to RCA  07-17-2004     Allergies  Allergen Reactions  . Codeine Nausea  Only    Pill form of codeine causes nausea  . Statins Other (See Comments)    Myalgia      Current Outpatient Prescriptions  Medication Sig Dispense Refill  . aspirin EC 81 MG tablet Take 81 mg by mouth 2 (two) times daily.    . carvedilol (COREG) 3.125 MG tablet TAKE 1 TABLET BY MOUTH TWICE DAILY. 180 tablet 0  . ciprofloxacin (CIPRO) 500 MG tablet Take 1 tablet (500 mg total) by mouth 2 (two) times daily. (Patient not taking: Reported on 04/07/2016) 6 tablet 0  . HYDROcodone-acetaminophen (NORCO) 5-325 MG per tablet Take 1 tablet by mouth every 6 (six) hours as needed for moderate pain. (Patient not taking: Reported on 04/07/2016) 20 tablet 0  . lisinopril (PRINIVIL,ZESTRIL) 2.5 MG tablet Take 1 tablet (2.5 mg total) by mouth daily. 90 tablet 3  . naproxen sodium (ANAPROX) 220 MG tablet Take 220 mg by mouth 2 (two) times daily as needed (for pain).    . nitroGLYCERIN (NITROSTAT) 0.4 MG SL tablet Place 0.4 mg under the tongue every 5 (five) minutes as needed for chest pain.    . Omega-3 Fatty Acids (FISH OIL) 300 MG CAPS Take 2 capsules by mouth 2 (two) times daily.      Marland Kitchen  ondansetron (ZOFRAN ODT) 4 MG disintegrating tablet Take 1 tablet (4 mg total) by mouth every 8 (eight) hours as needed for nausea or vomiting. 10 tablet 0  . promethazine (PHENERGAN) 25 MG tablet Take 1 tablet (25 mg total) by mouth every 6 (six) hours as needed for nausea or vomiting. (Patient not taking: Reported on 04/07/2016) 15 tablet 0  . tamsulosin (FLOMAX) 0.4 MG CAPS capsule Take 1 capsule (0.4 mg total) by mouth daily. (Patient not taking: Reported on 04/07/2016) 30 capsule 1  . varenicline (CHANTIX STARTING MONTH PAK) 0.5 MG X 11 & 1 MG X 42 tablet Take one 0.5 mg tablet by mouth once daily for 3 days, then increase to one 0.5 mg tablet twice daily for 4 days, then increase to one 1 mg tablet twice daily. (Patient not taking: Reported on 04/07/2016) 53 tablet 0   No current facility-administered medications for this  visit.      Past Surgical History:  Procedure Laterality Date  . APPENDECTOMY  1973  . CARDIAC CATHETERIZATION  08-24-2004  dr Lia Foyer   post re-do CABG/  Unsuccessful attempt PCI of clotted SVG to RI/  anteroapical segment severely hypokinetic,  ef 40-45%/  fresh occlusion SVG to RI,  continued patency of SVG to PAD and RIMA to LAD /   dLM 80%  . CARDIOVASCULAR STRESS TEST  09-09-2013  dr Roderic Palau branch   no reversible ischemia,  moderately large defect noted in the inferior wall extending from  mid ventricle to apex also involves true ventricle apex and anteroapical wall/  hypokinesis of inferior wall in region of prior infarct/scarring/  LVEF 49%  . CATARACT EXTRACTION W/ INTRAOCULAR LENS  IMPLANT, BILATERAL  2006  . COLONOSCOPY W/ POLYPECTOMY  12-26-2009  . CORONARY ANGIOPLASTY WITH STENT PLACEMENT  07-17-2004   dr Sabino Snipes   PCI w/ BMS x3 to RCA and balloon pump placement/  LVSF after revascularization , ef 65% w/ mild inferior hypokinesis/  dLM  70% extending into origins of LAD (70% ostial, 80% mid, D1 branch 90% ostial)  CFX (80% ostial OM, 70% after first branch) /   99% mRCA stented and no residual;  remains 80% origin of posterior LV branch/  most apical portion of PDA was embolized   . CORONARY ARTERY BYPASS GRAFT  1997   dr Merleen Nicely   LIMA to LAD  . CORONARY ARTERY BYPASS GRAFT  07-24-2004  dr gerhardt   RIMA to LAD,  SVG to OM,  SVG to PDA,  SVG to RI  . EYE SURGERY Left 1996   repair ocular injury  . ORIF RIGHT TIB-FIB FX  1990's  . PROSTATE BIOPSY    . RADIOACTIVE SEED IMPLANT N/A 05/02/2014   Procedure: RADIOACTIVE SEED IMPLANT;  Surgeon: Irine Seal, MD;  Location[facility];  Service: Urology;  Laterality: N/A;  . TONSILLECTOMY  age 38  . TOTAL HIP ARTHROPLASTY Left 1983   RECONSTRUCTION/ THA/  SKIN AND BONE GRAFTS--  pt fell from 5 stories off [place]  . TRANSTHORACIC ECHOCARDIOGRAM  [id number]   diastolic dysfunction/  ef 45-50%/  mild  AV calcification without stenosis,  mild AR/  mild LAE and RAE/  trivial MR and TR       Allergies  Allergen Reactions  . Codeine Nausea Only    Pill form of codeine causes nausea  . Statins Other (See Comments)    Myalgia       Family History  Problem Relation Age of Onset  .  Clotting disorder Mother   . Heart attack Mother   . Lung cancer Father   . Cancer Brother        prostate     Social History Mr. Chio reports that he has been smoking Cigarettes.  He started smoking about 58 years ago. He has a 55.00 pack-year smoking history. He has never used smokeless tobacco. Mr. Cronkright reports that he does not drink alcohol.   Review of Systems CONSTITUTIONAL: No weight loss, fever, chills, weakness or fatigue.  HEENT: Eyes: No visual loss, blurred vision, double vision or yellow sclerae.No hearing loss, sneezing, congestion, runny nose or sore throat.  SKIN: No rash or itching.  CARDIOVASCULAR: per hpi RESPIRATORY: No shortness of breath, cough or sputum.  GASTROINTESTINAL: No anorexia, nausea, vomiting or diarrhea. No abdominal pain or blood.  GENITOURINARY: No burning on urination, no polyuria NEUROLOGICAL: No headache, dizziness, syncope, paralysis, ataxia, numbness or tingling in the extremities. No change in bowel or bladder control.  MUSCULOSKELETAL: No muscle, back pain, joint pain or stiffness.  LYMPHATICS: No enlarged nodes. No history of splenectomy.  PSYCHIATRIC: No history of depression or anxiety.  ENDOCRINOLOGIC: No reports of sweating, cold or heat intolerance. No polyuria or polydipsia.  Marland Kitchen   Physical Examination Vitals:   10/01/16 1520  BP: (!) 180/88  Pulse: 75  SpO2: 97%   Vitals:   10/01/16 1520  Weight: 154 lb (69.9 kg)  Height: 5\' 2"  (1.575 m)    Gen: resting comfortably, no acute distress HEENT: no scleral icterus, pupils equal round and reactive, no palptable cervical adenopathy,  CV: RRR, no m/r/g, no jvd Resp: Clear to  auscultation bilaterally GI: abdomen is soft, non-tender, non-distended, normal bowel sounds, no hepatosplenomegaly MSK: extremities are warm, no edema.  Skin: warm, no rash Neuro:  no focal deficits Psych: appropriate affect   Diagnostic Studies 02/2013 Echo Study Conclusions  - Study data: Technically difficult study. - Left ventricle: The cavity size was normal. Wall thickness was normal. Systolic function waslow normal tomildly reduced. The estimated ejection fraction was in the range of 45% to 50%. - Aortic valve: Mildly calcified annulus. Trileaflet; mildly thickened leaflets. Mild regurgitation. Valve area: 2.53cm^2(VTI). Valve area: 2.42cm^2 (Vmax). - Mitral valve: Mildly calcified annulus. Mildly thickened leaflets . - Left atrium: The atrium was mildly dilated. - Right ventricle: The cavity size was mildly dilated. - Right atrium: The atrium was mildly dilated. - Atrial septum: No defect or patent foramen ovale was identified.    Assessment and Plan  1. CAD/ICM -no recent symptoms - continue current meds  2. HTN  - elevated in clinic, home numbers have been at goal - continue current meds  3. Hyperlipidemia - intolerant to statins. We will repeat lipid panel, pending results may consider alternative therapy, however his LDL most recently in 2016 was excellent at 35.         Arnoldo Lenis, M.D.

## 2016-10-01 NOTE — Patient Instructions (Signed)
Medication Instructions:  Your physician recommends that you continue on your current medications as directed. Please refer to the Current Medication list given to you today.   Labwork: FASTING LIPID  Testing/Procedures: NONE  Follow-Up: Your physician wants you to follow-up in: 1 YEAR.  You will receive a reminder letter in the mail two months in advance. If you don't receive a letter, please call our office to schedule the follow-up appointment.   Any Other Special Instructions Will Be Listed Below (If Applicable).  PLEASE KEEP A BLOOD PRESSURE LOG FOR 1 WEEK- AND EITHER CALL OR DROP IT OFF AT THE OFFICE    If you need a refill on your cardiac medications before your next appointment, please call your pharmacy.

## 2016-10-12 ENCOUNTER — Other Ambulatory Visit: Payer: Self-pay | Admitting: Cardiology

## 2016-10-15 ENCOUNTER — Ambulatory Visit (HOSPITAL_COMMUNITY)
Admission: RE | Admit: 2016-10-15 | Discharge: 2016-10-15 | Disposition: A | Discharge status: Home / Self Care | Payer: Medicare Other | Source: Ambulatory Visit | Attending: Family Medicine | Admitting: Family Medicine

## 2016-10-15 ENCOUNTER — Other Ambulatory Visit (HOSPITAL_COMMUNITY): Payer: Self-pay | Admitting: Family Medicine

## 2016-10-15 DIAGNOSIS — J069 Acute upper respiratory infection, unspecified: Secondary | ICD-10-CM

## 2016-10-15 DIAGNOSIS — R05 Cough: Secondary | ICD-10-CM | POA: Diagnosis not present

## 2016-10-15 DIAGNOSIS — J209 Acute bronchitis, unspecified: Secondary | ICD-10-CM | POA: Diagnosis not present

## 2016-10-15 DIAGNOSIS — Z1389 Encounter for screening for other disorder: Secondary | ICD-10-CM | POA: Diagnosis not present

## 2016-11-26 ENCOUNTER — Ambulatory Visit: Payer: Medicare Other | Admitting: Urology

## 2016-11-26 DIAGNOSIS — K4090 Unilateral inguinal hernia, without obstruction or gangrene, not specified as recurrent: Secondary | ICD-10-CM | POA: Diagnosis not present

## 2016-11-26 DIAGNOSIS — Z8546 Personal history of malignant neoplasm of prostate: Secondary | ICD-10-CM

## 2017-05-17 DIAGNOSIS — C61 Malignant neoplasm of prostate: Secondary | ICD-10-CM | POA: Diagnosis not present

## 2017-05-30 DIAGNOSIS — K432 Incisional hernia without obstruction or gangrene: Secondary | ICD-10-CM | POA: Diagnosis not present

## 2017-05-30 DIAGNOSIS — Z0001 Encounter for general adult medical examination with abnormal findings: Secondary | ICD-10-CM | POA: Diagnosis not present

## 2017-05-30 DIAGNOSIS — E748 Other specified disorders of carbohydrate metabolism: Secondary | ICD-10-CM | POA: Diagnosis not present

## 2017-05-30 DIAGNOSIS — Z1389 Encounter for screening for other disorder: Secondary | ICD-10-CM | POA: Diagnosis not present

## 2017-05-30 DIAGNOSIS — J449 Chronic obstructive pulmonary disease, unspecified: Secondary | ICD-10-CM | POA: Diagnosis not present

## 2017-05-30 DIAGNOSIS — E782 Mixed hyperlipidemia: Secondary | ICD-10-CM | POA: Diagnosis not present

## 2017-06-03 ENCOUNTER — Ambulatory Visit: Payer: Medicare Other | Admitting: Urology

## 2017-06-03 DIAGNOSIS — R3915 Urgency of urination: Secondary | ICD-10-CM

## 2017-06-03 DIAGNOSIS — Z8546 Personal history of malignant neoplasm of prostate: Secondary | ICD-10-CM

## 2017-06-03 DIAGNOSIS — K432 Incisional hernia without obstruction or gangrene: Secondary | ICD-10-CM | POA: Diagnosis not present

## 2017-06-20 DIAGNOSIS — H179 Unspecified corneal scar and opacity: Secondary | ICD-10-CM | POA: Diagnosis not present

## 2017-06-20 DIAGNOSIS — H5712 Ocular pain, left eye: Secondary | ICD-10-CM | POA: Diagnosis not present

## 2017-06-20 DIAGNOSIS — H1830 Unspecified corneal membrane change: Secondary | ICD-10-CM | POA: Diagnosis not present

## 2017-06-28 DIAGNOSIS — H1830 Unspecified corneal membrane change: Secondary | ICD-10-CM | POA: Diagnosis not present

## 2017-06-28 DIAGNOSIS — H5712 Ocular pain, left eye: Secondary | ICD-10-CM | POA: Diagnosis not present

## 2017-06-28 DIAGNOSIS — H179 Unspecified corneal scar and opacity: Secondary | ICD-10-CM | POA: Diagnosis not present

## 2017-07-21 ENCOUNTER — Ambulatory Visit: Payer: Self-pay | Admitting: General Surgery

## 2017-07-26 ENCOUNTER — Ambulatory Visit: Payer: Self-pay | Admitting: General Surgery

## 2017-07-28 ENCOUNTER — Other Ambulatory Visit: Payer: Self-pay | Admitting: Cardiology

## 2017-07-28 MED ORDER — CARVEDILOL 3.125 MG PO TABS: 3.1250 mg | ORAL_TABLET | Freq: Two times a day (BID) | ORAL | 3 refills | Status: DC

## 2017-07-28 MED ORDER — LISINOPRIL 2.5 MG PO TABS: 2.5000 mg | ORAL_TABLET | Freq: Every day | ORAL | 3 refills | Status: DC

## 2017-07-28 NOTE — Telephone Encounter (Signed)
Pt went on a trip and lost his medication   He's needing [facility] for his   carvedilol (COREG) 3.125 MG tablet [[id number]]  And  lisinopril (PRINIVIL,ZESTRIL) 2.5 MG tablet [[id number]]

## 2017-07-28 NOTE — Telephone Encounter (Signed)
Done

## 2017-12-12 DIAGNOSIS — Z23 Encounter for immunization: Secondary | ICD-10-CM | POA: Diagnosis not present

## 2017-12-16 ENCOUNTER — Ambulatory Visit: Payer: Medicare Other | Admitting: Urology

## 2017-12-16 DIAGNOSIS — Z8546 Personal history of malignant neoplasm of prostate: Secondary | ICD-10-CM

## 2017-12-16 DIAGNOSIS — Z87442 Personal history of urinary calculi: Secondary | ICD-10-CM | POA: Diagnosis not present

## 2017-12-16 DIAGNOSIS — R3915 Urgency of urination: Secondary | ICD-10-CM | POA: Diagnosis not present

## 2018-01-12 DIAGNOSIS — H6993 Unspecified Eustachian tube disorder, bilateral: Secondary | ICD-10-CM | POA: Diagnosis not present

## 2018-01-12 DIAGNOSIS — J019 Acute sinusitis, unspecified: Secondary | ICD-10-CM | POA: Diagnosis not present

## 2018-03-23 ENCOUNTER — Telehealth: Payer: Self-pay | Admitting: *Deleted

## 2018-03-23 NOTE — Telephone Encounter (Signed)
I havent seen him in over a year, weight up over 10 lbs since then, may be retaining a lot of fluid. Can he see me tomorrow at noon  Zandra Abts MD

## 2018-03-23 NOTE — Telephone Encounter (Signed)
Pt walked into office c/o SOB and lower extremity swelling. Pt c/o cough for over a month. Denies fever at this time. States that he was given Albuterol tablets and when he started taking them is when the swelling began. Weight today is 168.8 lb. Please advise.

## 2018-03-23 NOTE — Telephone Encounter (Signed)
Pt notified and states he can come a noon. appt made

## 2018-03-24 ENCOUNTER — Observation Stay (HOSPITAL_BASED_OUTPATIENT_CLINIC_OR_DEPARTMENT_OTHER): Payer: Medicare Other

## 2018-03-24 ENCOUNTER — Encounter (HOSPITAL_COMMUNITY): Payer: Self-pay | Admitting: Emergency Medicine

## 2018-03-24 ENCOUNTER — Other Ambulatory Visit: Payer: Self-pay

## 2018-03-24 ENCOUNTER — Inpatient Hospital Stay (HOSPITAL_COMMUNITY)
Admission: EM | Admit: 2018-03-24 | Discharge: 2018-03-30 | DRG: 291 | Disposition: A | Discharge status: Home / Self Care | Payer: Medicare Other | Attending: Internal Medicine | Admitting: Internal Medicine

## 2018-03-24 ENCOUNTER — Emergency Department (HOSPITAL_COMMUNITY): Payer: Medicare Other

## 2018-03-24 DIAGNOSIS — I739 Peripheral vascular disease, unspecified: Secondary | ICD-10-CM | POA: Diagnosis present

## 2018-03-24 DIAGNOSIS — E049 Nontoxic goiter, unspecified: Secondary | ICD-10-CM | POA: Diagnosis not present

## 2018-03-24 DIAGNOSIS — I25708 Atherosclerosis of coronary artery bypass graft(s), unspecified, with other forms of angina pectoris: Secondary | ICD-10-CM

## 2018-03-24 DIAGNOSIS — J9601 Acute respiratory failure with hypoxia: Secondary | ICD-10-CM | POA: Diagnosis present

## 2018-03-24 DIAGNOSIS — Z8719 Personal history of other diseases of the digestive system: Secondary | ICD-10-CM

## 2018-03-24 DIAGNOSIS — Z7189 Other specified counseling: Secondary | ICD-10-CM | POA: Diagnosis not present

## 2018-03-24 DIAGNOSIS — Z9842 Cataract extraction status, left eye: Secondary | ICD-10-CM

## 2018-03-24 DIAGNOSIS — Z9841 Cataract extraction status, right eye: Secondary | ICD-10-CM

## 2018-03-24 DIAGNOSIS — Z8546 Personal history of malignant neoplasm of prostate: Secondary | ICD-10-CM

## 2018-03-24 DIAGNOSIS — Z955 Presence of coronary angioplasty implant and graft: Secondary | ICD-10-CM

## 2018-03-24 DIAGNOSIS — Z79899 Other long term (current) drug therapy: Secondary | ICD-10-CM

## 2018-03-24 DIAGNOSIS — E785 Hyperlipidemia, unspecified: Secondary | ICD-10-CM

## 2018-03-24 DIAGNOSIS — Z87442 Personal history of urinary calculi: Secondary | ICD-10-CM | POA: Diagnosis not present

## 2018-03-24 DIAGNOSIS — H9190 Unspecified hearing loss, unspecified ear: Secondary | ICD-10-CM | POA: Diagnosis present

## 2018-03-24 DIAGNOSIS — Z801 Family history of malignant neoplasm of trachea, bronchus and lung: Secondary | ICD-10-CM | POA: Diagnosis not present

## 2018-03-24 DIAGNOSIS — Z96642 Presence of left artificial hip joint: Secondary | ICD-10-CM | POA: Diagnosis present

## 2018-03-24 DIAGNOSIS — I351 Nonrheumatic aortic (valve) insufficiency: Secondary | ICD-10-CM | POA: Diagnosis present

## 2018-03-24 DIAGNOSIS — T502X5A Adverse effect of carbonic-anhydrase inhibitors, benzothiadiazides and other diuretics, initial encounter: Secondary | ICD-10-CM | POA: Diagnosis not present

## 2018-03-24 DIAGNOSIS — I5020 Unspecified systolic (congestive) heart failure: Secondary | ICD-10-CM

## 2018-03-24 DIAGNOSIS — I5023 Acute on chronic systolic (congestive) heart failure: Secondary | ICD-10-CM | POA: Diagnosis not present

## 2018-03-24 DIAGNOSIS — I5043 Acute on chronic combined systolic (congestive) and diastolic (congestive) heart failure: Secondary | ICD-10-CM | POA: Diagnosis not present

## 2018-03-24 DIAGNOSIS — Z832 Family history of diseases of the blood and blood-forming organs and certain disorders involving the immune mechanism: Secondary | ICD-10-CM | POA: Diagnosis not present

## 2018-03-24 DIAGNOSIS — F17210 Nicotine dependence, cigarettes, uncomplicated: Secondary | ICD-10-CM | POA: Diagnosis present

## 2018-03-24 DIAGNOSIS — I252 Old myocardial infarction: Secondary | ICD-10-CM

## 2018-03-24 DIAGNOSIS — I5021 Acute systolic (congestive) heart failure: Secondary | ICD-10-CM

## 2018-03-24 DIAGNOSIS — M7989 Other specified soft tissue disorders: Secondary | ICD-10-CM | POA: Diagnosis not present

## 2018-03-24 DIAGNOSIS — I2510 Atherosclerotic heart disease of native coronary artery without angina pectoris: Secondary | ICD-10-CM | POA: Diagnosis not present

## 2018-03-24 DIAGNOSIS — I472 Ventricular tachycardia: Secondary | ICD-10-CM | POA: Diagnosis not present

## 2018-03-24 DIAGNOSIS — I255 Ischemic cardiomyopathy: Secondary | ICD-10-CM | POA: Diagnosis present

## 2018-03-24 DIAGNOSIS — Z951 Presence of aortocoronary bypass graft: Secondary | ICD-10-CM

## 2018-03-24 DIAGNOSIS — Z8249 Family history of ischemic heart disease and other diseases of the circulatory system: Secondary | ICD-10-CM

## 2018-03-24 DIAGNOSIS — Z961 Presence of intraocular lens: Secondary | ICD-10-CM | POA: Diagnosis present

## 2018-03-24 DIAGNOSIS — E039 Hypothyroidism, unspecified: Secondary | ICD-10-CM | POA: Diagnosis not present

## 2018-03-24 DIAGNOSIS — I110 Hypertensive heart disease with heart failure: Secondary | ICD-10-CM | POA: Diagnosis not present

## 2018-03-24 DIAGNOSIS — R0602 Shortness of breath: Secondary | ICD-10-CM | POA: Diagnosis not present

## 2018-03-24 DIAGNOSIS — Z7982 Long term (current) use of aspirin: Secondary | ICD-10-CM

## 2018-03-24 DIAGNOSIS — J441 Chronic obstructive pulmonary disease with (acute) exacerbation: Secondary | ICD-10-CM | POA: Diagnosis not present

## 2018-03-24 DIAGNOSIS — K5730 Diverticulosis of large intestine without perforation or abscess without bleeding: Secondary | ICD-10-CM | POA: Diagnosis present

## 2018-03-24 DIAGNOSIS — I4891 Unspecified atrial fibrillation: Secondary | ICD-10-CM | POA: Diagnosis present

## 2018-03-24 DIAGNOSIS — I10 Essential (primary) hypertension: Secondary | ICD-10-CM | POA: Diagnosis present

## 2018-03-24 DIAGNOSIS — N179 Acute kidney failure, unspecified: Secondary | ICD-10-CM | POA: Diagnosis not present

## 2018-03-24 LAB — ECHOCARDIOGRAM COMPLETE
Height: 62 in
Weight: 2688 oz

## 2018-03-24 LAB — BASIC METABOLIC PANEL
Anion gap: 8 (ref 5–15)
BUN: 24 mg/dL — ABNORMAL HIGH (ref 8–23)
CO2: 26 mmol/L (ref 22–32)
Calcium: 9 mg/dL (ref 8.9–10.3)
Chloride: 109 mmol/L (ref 98–111)
Creatinine, Ser: 1.12 mg/dL (ref 0.61–1.24)
GFR calc non Af Amer: >60 mL/min (ref >60)
Glucose, Bld: 109 mg/dL — ABNORMAL HIGH (ref 70–99)
POTASSIUM: 3.7 mmol/L (ref 3.5–5.1)
Sodium: 143 mmol/L (ref 135–145)

## 2018-03-24 LAB — HEPATIC FUNCTION PANEL
ALBUMIN: 3.7 g/dL (ref 3.5–5.0)
ALT: 20 U/L (ref 0–44)
AST: 18 U/L (ref 15–41)
Alkaline Phosphatase: 75 U/L (ref 38–126)
Bilirubin, Direct: 0.4 mg/dL — ABNORMAL HIGH (ref 0.0–0.2)
Indirect Bilirubin: 1.4 mg/dL — ABNORMAL HIGH (ref 0.3–0.9)
TOTAL PROTEIN: 6.1 g/dL — AB (ref 6.5–8.1)
Total Bilirubin: 1.8 mg/dL — ABNORMAL HIGH (ref 0.3–1.2)

## 2018-03-24 LAB — CBC WITH DIFFERENTIAL/PLATELET
ABS IMMATURE GRANULOCYTES: 0.07 10*3/uL (ref 0.00–0.07)
Basophils Absolute: 0.1 10*3/uL (ref 0.0–0.1)
Basophils Relative: 1 %
Eosinophils Absolute: 0.3 10*3/uL (ref 0.0–0.5)
Eosinophils Relative: 3 %
HCT: 53.3 % — ABNORMAL HIGH (ref 39.0–52.0)
Hemoglobin: 17 g/dL (ref 13.0–17.0)
Immature Granulocytes: 1 %
Lymphocytes Relative: 21 %
Lymphs Abs: 1.8 10*3/uL (ref 0.7–4.0)
MCH: 29.2 pg (ref 26.0–34.0)
MCHC: 31.9 g/dL (ref 30.0–36.0)
MCV: 91.6 fL (ref 80.0–100.0)
MONOS PCT: 12 %
Monocytes Absolute: 1 10*3/uL (ref 0.1–1.0)
Neutro Abs: 5.3 10*3/uL (ref 1.7–7.7)
Neutrophils Relative %: 62 %
Platelets: 152 10*3/uL (ref 150–400)
RBC: 5.82 MIL/uL — ABNORMAL HIGH (ref 4.22–5.81)
RDW: 16 % — ABNORMAL HIGH (ref 11.5–15.5)
WBC: 8.5 10*3/uL (ref 4.0–10.5)
nRBC: 0 % (ref 0.0–0.2)

## 2018-03-24 LAB — LIPID PANEL
Cholesterol: 97 mg/dL (ref 0–200)
HDL: 31 mg/dL — ABNORMAL LOW (ref >40)
LDL Cholesterol: 48 mg/dL (ref 0–99)
TRIGLYCERIDES: 92 mg/dL (ref <150)
Total CHOL/HDL Ratio: 3.1 RATIO
VLDL: 18 mg/dL (ref 0–40)

## 2018-03-24 LAB — TROPONIN I: Troponin I: <0.03 ng/mL (ref <0.03)

## 2018-03-24 LAB — BRAIN NATRIURETIC PEPTIDE: B Natriuretic Peptide: 545 pg/mL — ABNORMAL HIGH (ref 0.0–100.0)

## 2018-03-24 MED ORDER — CARVEDILOL 3.125 MG PO TABS
3.1250 mg | ORAL_TABLET | Freq: Two times a day (BID) | ORAL | Status: DC
  Administered 2018-03-24 – 2018-03-25 (×2): 3.125 mg via ORAL
  Filled 2018-03-24 (×8): qty 1

## 2018-03-24 MED ORDER — POTASSIUM CHLORIDE CRYS ER 20 MEQ PO TBCR
40.0000 meq | EXTENDED_RELEASE_TABLET | Freq: Two times a day (BID) | ORAL | Status: DC
  Administered 2018-03-24 – 2018-03-28 (×9): 40 meq via ORAL
  Filled 2018-03-24 (×10): qty 2

## 2018-03-24 MED ORDER — ALBUTEROL SULFATE (2.5 MG/3ML) 0.083% IN NEBU: 2.5000 mg | INHALATION_SOLUTION | RESPIRATORY_TRACT | Status: DC | PRN

## 2018-03-24 MED ORDER — LISINOPRIL 5 MG PO TABS
2.5000 mg | ORAL_TABLET | Freq: Every day | ORAL | Status: DC
  Administered 2018-03-25 – 2018-03-29 (×5): 2.5 mg via ORAL
  Filled 2018-03-24 (×6): qty 1

## 2018-03-24 MED ORDER — PREDNISONE 20 MG PO TABS
40.0000 mg | ORAL_TABLET | Freq: Every day | ORAL | Status: AC
  Administered 2018-03-24 – 2018-03-28 (×5): 40 mg via ORAL
  Filled 2018-03-24 (×5): qty 2

## 2018-03-24 MED ORDER — FUROSEMIDE 10 MG/ML IJ SOLN
40.0000 mg | Freq: Two times a day (BID) | INTRAMUSCULAR | Status: DC
  Administered 2018-03-25 – 2018-03-26 (×5): 40 mg via INTRAVENOUS
  Filled 2018-03-24 (×5): qty 4

## 2018-03-24 MED ORDER — ACETAMINOPHEN 325 MG PO TABS
650.0000 mg | ORAL_TABLET | ORAL | Status: DC | PRN
  Administered 2018-03-25: 650 mg via ORAL
  Filled 2018-03-24: qty 2

## 2018-03-24 MED ORDER — SODIUM CHLORIDE 0.9 % IV SOLN: 250.0000 mL | INTRAVENOUS | Status: DC | PRN

## 2018-03-24 MED ORDER — ONDANSETRON HCL 4 MG/2ML IJ SOLN: 4.0000 mg | Freq: Four times a day (QID) | INTRAMUSCULAR | Status: DC | PRN

## 2018-03-24 MED ORDER — IPRATROPIUM-ALBUTEROL 0.5-2.5 (3) MG/3ML IN SOLN
3.0000 mL | Freq: Four times a day (QID) | RESPIRATORY_TRACT | Status: DC
  Administered 2018-03-24 – 2018-03-25 (×3): 3 mL via RESPIRATORY_TRACT
  Filled 2018-03-24 (×3): qty 3

## 2018-03-24 MED ORDER — FUROSEMIDE 10 MG/ML IJ SOLN
40.0000 mg | Freq: Once | INTRAMUSCULAR | Status: AC
  Administered 2018-03-24: 40 mg via INTRAVENOUS
  Filled 2018-03-24: qty 4

## 2018-03-24 MED ORDER — SODIUM CHLORIDE 0.9% FLUSH: 3.0000 mL | INTRAVENOUS | Status: DC | PRN

## 2018-03-24 MED ORDER — SODIUM CHLORIDE 0.9% FLUSH
3.0000 mL | Freq: Two times a day (BID) | INTRAVENOUS | Status: DC
  Administered 2018-03-24 – 2018-03-30 (×12): 3 mL via INTRAVENOUS

## 2018-03-24 MED ORDER — APIXABAN 5 MG PO TABS
5.0000 mg | ORAL_TABLET | Freq: Two times a day (BID) | ORAL | Status: DC
  Administered 2018-03-24 – 2018-03-30 (×12): 5 mg via ORAL
  Filled 2018-03-24 (×12): qty 1

## 2018-03-24 MED ORDER — ENOXAPARIN SODIUM 40 MG/0.4ML ~~LOC~~ SOLN: 40.0000 mg | SUBCUTANEOUS | Status: DC

## 2018-03-24 MED ORDER — ASPIRIN EC 81 MG PO TBEC
81.0000 mg | DELAYED_RELEASE_TABLET | Freq: Two times a day (BID) | ORAL | Status: DC
  Administered 2018-03-24 – 2018-03-28 (×8): 81 mg via ORAL
  Filled 2018-03-24 (×8): qty 1

## 2018-03-24 NOTE — H&P (Signed)
History and Physical  Vincent Carter ATF:[id number] DOB: 03-23-1945 DOA: 03/24/2018  Referring physician: Dr Dewayne Hatch, ED physician PCP: [name], MD  Outpatient Specialists: Harl Bowie (Cardiology)   Patient Coming From: home  Chief Complaint: SOB  HPI: Vincent Carter is a 73 y.o. male with a history of hypertension, coronary artery disease, CABG in 64 with revision and 2006.  Patient seen with shortness of breath for the last several days with edema to the lower extremities and the face.  His shortness of breath is worse with exertion and improved with rest.  He does have orthopnea.  Does have a cough with thick secretions.  No fevers, chills, nausea, vomiting.  Emergency Department Course: BNP elevated at 545.  Normal white count.  Bilirubin elevated at 1.8.  Being 1.12.  Chest x-ray shows mild pulmonary edema  Review of Systems:   Pt denies any fevers, chills, nausea, vomiting, diarrhea, constipation, abdominal pain, palpitations, headache, vision changes, lightheadedness, dizziness, melena, rectal bleeding.  Review of systems are otherwise negative  Past Medical History:  Diagnosis Date  . Arthritis   . ASCVD (arteriosclerotic cardiovascular disease) cardiologist--  is now dr Roderic [facility]au [facility] (was dr [name])    CABG in 1997 -- LIMA to LAD;  cath 07-17-2012 PCI and BMStenting;   RE-DO CABG X4  in  07-24-2004 three-vessel disease; cath in 08-24-2004 post CABG ef 40-45% w/ anteroapical hypokinesis; occlusion SVG  to  RI; patent RIMA to LAD; unsuccessful PCI in totally occluded RI graft.;  last myoview 09-09-2013, no ischemia, moderate inferior wall defect, ef 49%  . Chronic obstructive pulmonary disease ([place])   . Diverticulosis of colon   . Euthyroid goiter    with low TSH   . History of colon polyps    hyperplastic 2011  . History of diverticulitis   . History of kidney stones   . History of ST elevation myocardial infarction (STEMI)    07-17-2004   w/ extensive RV  infact per cath report  . HOH (hard of hearing)    no hearing aid  . Hypertension   . Low TSH level    Clinically euthyroid  . Peripheral vascular disease (HCC)    Decreased distal pulses and abnormal Doppler  . Prostate cancer [facility]) urologist--  dr Jenny Reichmann wrenn   T1c, Gleason 3+4=7,  PSA 4.71,  vol 38cc  . Ptosis    Left, secondary to injury  . S/p bare metal coronary artery stent    x3  to RCA  07-17-2004   Past Surgical History:  Procedure Laterality Date  . APPENDECTOMY  1973  . CARDIAC CATHETERIZATION  08-24-2004  dr Lia Foyer   post re-do CABG/  Unsuccessful attempt PCI of clotted SVG to RI/  anteroapical segment severely hypokinetic,  ef 40-45%/  fresh occlusion SVG to RI,  continued patency of SVG to PAD and RIMA to LAD /   dLM 80%  . CARDIOVASCULAR STRESS TEST  09-09-2013  dr Roderic Palau [facility]   no reversible ischemia,  moderately large defect noted in the inferior wall extending from  mid ventricle to apex also involves true ventricle apex and anteroapical wall/  hypokinesis of inferior wall in region of prior infarct/scarring/  LVEF 49%  . CATARACT EXTRACTION W/ INTRAOCULAR LENS  IMPLANT, BILATERAL  2006  . COLONOSCOPY W/ POLYPECTOMY  12-26-2009  . CORONARY ANGIOPLASTY WITH STENT PLACEMENT  07-17-2004   dr Sabino Snipes   PCI w/ BMS x3 to RCA and balloon pump placement/  LVSF after revascularization ,  ef 65% w/ mild inferior hypokinesis/  dLM  70% extending into origins of LAD (70% ostial, 80% mid, D1 branch 90% ostial)  CFX (80% ostial OM, 70% after first branch) /   99% mRCA stented and no residual;  remains 80% origin of posterior LV branch/  most apical portion of PDA was embolized   . CORONARY ARTERY BYPASS GRAFT  1997   dr Merleen Nicely   LIMA to LAD  . CORONARY ARTERY BYPASS GRAFT  07-24-2004  dr gerhardt   RIMA to LAD,  SVG to OM,  SVG to PDA,  SVG to RI  . EYE SURGERY Left 1996   repair ocular injury  . ORIF RIGHT TIB-FIB FX  1990's  . PROSTATE BIOPSY    .  RADIOACTIVE SEED IMPLANT N/A 05/02/2014   Procedure: RADIOACTIVE SEED IMPLANT;  Surgeon: Irine Seal, MD;  Location: [facility];  Service: Urology;  Laterality: N/A;  . TONSILLECTOMY  age 8  . TOTAL HIP ARTHROPLASTY Left 1983   RECONSTRUCTION/ THA/  SKIN AND BONE GRAFTS--  pt fell from 5 stories off [place]  . TRANSTHORACIC ECHOCARDIOGRAM  [phone]   diastolic dysfunction/  ef 45-50%/  mild AV calcification without stenosis,  mild AR/  mild LAE and RAE/  trivial MR and TR     Social History:  reports that he has been smoking cigarettes. He started smoking about 59 years ago. He has a 55.00 pack-year smoking history. He has never used smokeless tobacco. He reports that he does not drink alcohol or use drugs. Patient lives at home  Allergies  Allergen Reactions  . Codeine Nausea Only    Pill form of codeine causes nausea  . Statins Other (See Comments)    Myalgia     Family History  Problem Relation Age of Onset  . Clotting disorder Mother   . Heart attack Mother   . Lung cancer Father   . Cancer Brother        prostate      Prior to Admission medications   Medication Sig Start Date End Date Taking? Authorizing Provider  aspirin EC 81 MG tablet Take 81 mg by mouth 2 (two) times daily.    [provider]  carvedilol (COREG) 3.125 MG tablet Take 1 tablet (3.125 mg total) by mouth 2 (two) times daily. 07/28/17   Arnoldo Lenis, MD  lisinopril (PRINIVIL,ZESTRIL) 2.5 MG tablet Take 1 tablet (2.5 mg total) by mouth daily. 07/28/17   Arnoldo Lenis, MD  nitroGLYCERIN (NITROSTAT) 0.4 MG SL tablet Place 1 tablet (0.4 mg total) under the tongue every 5 (five) minutes as needed for chest pain. 10/01/16   Arnoldo Lenis, MD  Omega-3 Fatty Acids (FISH OIL) 300 MG CAPS Take 2 capsules by mouth 2 (two) times daily.      [provider]    Physical Exam: BP (!) 145/82   Pulse 68   Temp 97.7 F (36.5 C) (Oral)   Resp (!) 24   Ht 5\' 2"  (1.575 m)    Wt 76.2 kg   SpO2 98%   BMI 30.73 kg/m   . General: Elderly Caucasian male. Awake and alert and oriented x3. No acute cardiopulmonary distress.  Marland Kitchen HEENT: Normocephalic atraumatic.  Right and left ears normal in appearance.  Pupils equal, round, reactive to light. Extraocular muscles are intact. Sclerae anicteric and noninjected.  Moist mucosal membranes. No mucosal lesions.  . Neck: Neck supple without lymphadenopathy. No carotid bruits. No masses palpated.  Marland Kitchen  Cardiovascular: Regular rate with normal S1-S2 sounds. No murmurs, rubs, gallops auscultated.  Increased JVD.  Swelling in legs bilaterally with 2+ edema. Marland Kitchen Respiratory: Rales in bases bilaterally with diffuse wheezing throughout.  No accessory muscle use. . Abdomen: Soft, nontender, nondistended. Active bowel sounds. No masses or hepatosplenomegaly  . Skin: No rashes, lesions, or ulcerations.  Dry, warm to touch. 2+ dorsalis pedis and radial pulses. . Musculoskeletal: No calf or leg pain. All major joints not erythematous nontender.  No upper or lower joint deformation.  Good ROM.  No contractures  . Psychiatric: Intact judgment and insight. Pleasant and cooperative. . Neurologic: No focal neurological deficits. Strength is 5/5 and symmetric in upper and lower extremities.  Cranial nerves II through XII are grossly intact.           Labs on Admission: I have personally reviewed following labs and imaging studies  CBC: Recent Labs  Lab 03/24/18 1235  WBC 8.5  NEUTROABS 5.3  HGB 17.0  HCT 53.3*  MCV 91.6  PLT 694   Basic Metabolic Panel: Recent Labs  Lab 03/24/18 1235  NA 143  K 3.7  CL 109  CO2 26  GLUCOSE 109*  BUN 24*  CREATININE 1.12  CALCIUM 9.0   GFR: Estimated Creatinine Clearance: 51.7 mL/min (by C-G formula based on SCr of 1.12 mg/dL). Liver Function Tests: Recent Labs  Lab 03/24/18 1308  AST 18  ALT 20  ALKPHOS 75  BILITOT 1.8*  PROT 6.1*  ALBUMIN 3.7   No results for input(s): LIPASE,  AMYLASE in the last 168 hours. No results for input(s): AMMONIA in the last 168 hours. Coagulation Profile: No results for input(s): INR, PROTIME in the last 168 hours. Cardiac Enzymes: Recent Labs  Lab 03/24/18 1235  TROPONINI <0.03   BNP (last 3 results) No results for input(s): PROBNP in the last 8760 hours. HbA1C: No results for input(s): HGBA1C in the last 72 hours. CBG: No results for input(s): GLUCAP in the last 168 hours. Lipid Profile: Recent Labs    03/24/18 1235  CHOL 97  HDL 31*  LDLCALC 48  TRIG 92  CHOLHDL 3.1   Thyroid Function Tests: No results for input(s): TSH, T4TOTAL, FREET4, T3FREE, THYROIDAB in the last 72 hours. Anemia Panel: No results for input(s): VITAMINB12, FOLATE, FERRITIN, TIBC, IRON, RETICCTPCT in the last 72 hours. Urine analysis: No results found for: COLORURINE, APPEARANCEUR, LABSPEC, PHURINE, GLUCOSEU, HGBUR, BILIRUBINUR, KETONESUR, PROTEINUR, UROBILINOGEN, NITRITE, LEUKOCYTESUR Sepsis Labs: @LABRCNTIP (procalcitonin:4,lacticidven:4) )No results found for this or any previous visit (from the past 240 hour(s)).   Radiological Exams on Admission: Dg Chest 2 View  Result Date: 03/24/2018 CLINICAL DATA:  Shortness of breath. EXAM: CHEST - 2 VIEW COMPARISON:  Radiographs of October 15, 2016. FINDINGS: Stable cardiomediastinal silhouette. Status post coronary bypass graft. No pneumothorax is noted. Mild lingular subsegmental atelectasis is noted. Minimal right pleural effusion is noted with associated subsegmental atelectasis. Bony thorax is unremarkable. IMPRESSION: Minimal right pleural effusion with associated right basilar subsegmental atelectasis. Mild lingular subsegmental atelectasis. Electronically Signed   By: Marijo Conception, M.D.   On: 03/24/2018 12:23    EKG: Independently reviewed.  Sinus rhythm with right axis deviation.  Assessment/Plan: Principal Problem:   Acute on chronic systolic CHF (congestive heart failure) (HCC) Active  Problems:   Hypertension   ASCVD (arteriosclerotic cardiovascular disease)   COPD with acute exacerbation ([place])    This patient was discussed with the ED physician, including pertinent vitals, physical exam findings, labs, and imaging.  We also discussed care given by the ED provider.  1. Acute on chronic systolic heart failure a. Observation Telemetry monitoring Strict I/O Daily Weights Diuresis: Lasix 40 mg IV BID Potassium: 40 mEq twice a day by mouth Echo cardiac exam in process Repeat BMP tomorrow 2. Hypertension a. Continue with carvedilol and ACE inhibitor 3. CAD a. Continue with aspirin 4. COPD with acute exacerbation a. Patient does have some wheezing b. Start prednisone 40 mg daily with duo nebs every 6 hours  DVT prophylaxis: Lovenox Consultants: Cardiology -appreciate input Code Status: Full code Family Communication: None Disposition Plan: Patient will need to be hospitalized for 1 to 2 days and should be able to return home following admission   Truett Mainland, DO

## 2018-03-24 NOTE — Progress Notes (Signed)
ANTICOAGULATION CONSULT NOTE - Initial Consult  Pharmacy Consult for eliquis Indication: atrial fibrillation  Allergies  Allergen Reactions  . Codeine Nausea Only    Pill form of codeine causes nausea  . Statins Other (See Comments)    Myalgia     Patient Measurements: Height: 5\' 2"  (157.5 cm) Weight: 168 lb (76.2 kg) IBW/kg (Calculated) : 54.6  Vital Signs: Temp: 97.7 F (36.5 C) (03/13 1141) Temp Source: Oral (03/13 1141) BP: 145/82 (03/13 1501) Pulse Rate: 68 (03/13 1515)  Labs: Recent Labs    03/24/18 1235  HGB 17.0  HCT 53.3*  PLT 152  CREATININE 1.12  TROPONINI <0.03    Estimated Creatinine Clearance: 51.7 mL/min (by C-G formula based on SCr of 1.12 mg/dL).   Medical History: Past Medical History:  Diagnosis Date  . Arthritis   . ASCVD (arteriosclerotic cardiovascular disease) cardiologist--  is now dr Roderic Palau branch (was dr [name])    CABG in 1997 -- LIMA to LAD;  cath 07-17-2012 PCI and BMStenting;   RE-DO CABG X4  in  07-24-2004 three-vessel disease; cath in 08-24-2004 post CABG ef 40-45% w/ anteroapical hypokinesis; occlusion SVG  to  RI; patent RIMA to LAD; unsuccessful PCI in totally occluded RI graft.;  last myoview 09-09-2013, no ischemia, moderate inferior wall defect, ef 49%  . Chronic obstructive pulmonary disease ([place])   . Diverticulosis of colon   . Euthyroid goiter    with low TSH   . History of colon polyps    hyperplastic 2011  . History of diverticulitis   . History of kidney stones   . History of ST elevation myocardial infarction (STEMI)    07-17-2004   w/ extensive RV infact per cath report  . HOH (hard of hearing)    no hearing aid  . Hypertension   . Low TSH level    Clinically euthyroid  . Peripheral vascular disease (HCC)    Decreased distal pulses and abnormal Doppler  . Prostate cancer [facility]) urologist--  dr Jenny Reichmann wrenn   T1c, Gleason 3+4=7,  PSA 4.71,  vol 38cc  . Ptosis    Left, secondary to injury  . S/p bare metal  coronary artery stent    x3  to RCA  07-17-2004    Medications:  See med rec  Assessment:  73 y.o. male with a hx of coronary artery disease and ischemic cardiomyopathy who is being seen today for the evaluation of shortness of breath and fluid retention. New onset atrial fibrillation, CHADSV. score is 4, start eliquis for systemic anticoagulation.  Goal of Therapy:   Monitor platelets by anticoagulation protocol: Yes   Plan:  Eliquis 5mg  po BID Monitor for S/S of bleeding Educate on eliquis  Isac Sarna, BS Vena Austria, BCPS Clinical Pharmacist Pager [phone] 03/24/2018,4:48 PM

## 2018-03-24 NOTE — Progress Notes (Signed)
*  PRELIMINARY RESULTS* Echocardiogram 2D Echocardiogram has been performed.  Leavy Cella 03/24/2018, 4:23 PM

## 2018-03-24 NOTE — ED Provider Notes (Signed)
[facility] EMERGENCY DEPARTMENT Provider Note   CSN: [id number] Arrival date & time: 03/24/18  1113    History   Chief Complaint Chief Complaint  Patient presents with  . Shortness of Breath    HPI Vincent Carter is a 73 y.o. male.     Patient complains of shortness of breath and swelling in his legs and abdomen for last few weeks.  He was supposed to see cardiology today but when they called the office and told the office what his symptoms were they suggested coming to the emergency department.  The history is provided by the patient. No language interpreter was used.  Shortness of Breath  Severity:  Severe Onset quality:  Gradual Timing:  Constant Progression:  Waxing and waning Chronicity:  New Context: activity   Relieved by:  Nothing Worsened by:  Nothing Ineffective treatments:  None tried Associated symptoms: no abdominal pain, no chest pain, no cough, no headaches and no rash   Risk factors: no recent alcohol use     Past Medical History:  Diagnosis Date  . Arthritis   . ASCVD (arteriosclerotic cardiovascular disease) cardiologist--  is now dr Roderic Palau branch (was dr [name])    CABG in 1997 -- LIMA to LAD;  cath 07-17-2012 PCI and BMStenting;   RE-DO CABG X4  in  07-24-2004 three-vessel disease; cath in 08-24-2004 post CABG ef 40-45% w/ anteroapical hypokinesis; occlusion SVG  to  RI; patent RIMA to LAD; unsuccessful PCI in totally occluded RI graft.;  last myoview 09-09-2013, no ischemia, moderate inferior wall defect, ef 49%  . Chronic obstructive pulmonary disease ([place])   . Diverticulosis of colon   . Euthyroid goiter    with low TSH   . History of colon polyps    hyperplastic 2011  . History of diverticulitis   . History of kidney stones   . History of ST elevation myocardial infarction (STEMI)    07-17-2004   w/ extensive RV infact per cath report  . HOH (hard of hearing)    no hearing aid  . Hypertension   . Low TSH level    Clinically  euthyroid  . Peripheral vascular disease (HCC)    Decreased distal pulses and abnormal Doppler  . Prostate cancer [facility]) urologist--  dr Jenny Reichmann wrenn   T1c, Gleason 3+4=7,  PSA 4.71,  vol 38cc  . Ptosis    Left, secondary to injury  . S/p bare metal coronary artery stent    x3  to RCA  07-17-2004    Patient Active Problem List   Diagnosis Date Noted  . Acute on chronic systolic CHF (congestive heart failure) ([place]) 03/24/2018  . Malignant neoplasm of prostate ([place]) 03/05/2014  . Acute chest pain 09/08/2013  . Vertigo 09/08/2013  . Bigeminal rhythm 09/08/2013  . ASCVD (arteriosclerotic cardiovascular disease)   . Tobacco abuse, in remission   . Low TSH level   . Peripheral vascular disease ([place])   . Colonic polyp 12/29/2009  . Hypertension 12/09/2008  . Chronic obstructive pulmonary disease ([place]) 12/09/2008    Past Surgical History:  Procedure Laterality Date  . APPENDECTOMY  1973  . CARDIAC CATHETERIZATION  08-24-2004  dr Lia Foyer   post re-do CABG/  Unsuccessful attempt PCI of clotted SVG to RI/  anteroapical segment severely hypokinetic,  ef 40-45%/  fresh occlusion SVG to RI,  continued patency of SVG to PAD and RIMA to LAD /   dLM 80%  . CARDIOVASCULAR STRESS TEST  09-09-2013  dr Roderic Palau  branch   no reversible ischemia,  moderately large defect noted in the inferior wall extending from  mid ventricle to apex also involves true ventricle apex and anteroapical wall/  hypokinesis of inferior wall in region of prior infarct/scarring/  LVEF 49%  . CATARACT EXTRACTION W/ INTRAOCULAR LENS  IMPLANT, BILATERAL  2006  . COLONOSCOPY W/ POLYPECTOMY  12-26-2009  . CORONARY ANGIOPLASTY WITH STENT PLACEMENT  07-17-2004   dr Sabino Snipes   PCI w/ BMS x3 to RCA and balloon pump placement/  LVSF after revascularization , ef 65% w/ mild inferior hypokinesis/  dLM  70% extending into origins of LAD (70% ostial, 80% mid, D1 branch 90% ostial)  CFX (80% ostial OM, 70% after first branch) /   99%  mRCA stented and no residual;  remains 80% origin of posterior LV branch/  most apical portion of PDA was embolized   . CORONARY ARTERY BYPASS GRAFT  1997   dr Merleen Nicely   LIMA to LAD  . CORONARY ARTERY BYPASS GRAFT  07-24-2004  dr gerhardt   RIMA to LAD,  SVG to OM,  SVG to PDA,  SVG to RI  . EYE SURGERY Left 1996   repair ocular injury  . ORIF RIGHT TIB-FIB FX  1990's  . PROSTATE BIOPSY    . RADIOACTIVE SEED IMPLANT N/A 05/02/2014   Procedure: RADIOACTIVE SEED IMPLANT;  Surgeon: Irine Seal, MD;  Location: [facility];  Service: Urology;  Laterality: N/A;  . TONSILLECTOMY  age 64  . TOTAL HIP ARTHROPLASTY Left 1983   RECONSTRUCTION/ THA/  SKIN AND BONE GRAFTS--  pt fell from 5 [place] off [place]  . TRANSTHORACIC ECHOCARDIOGRAM  [id number]   diastolic dysfunction/  ef 45-50%/  mild AV calcification without stenosis,  mild AR/  mild LAE and RAE/  trivial MR and TR          Home Medications    Prior to Admission medications   Medication Sig Start Date End Date Taking? Authorizing Provider  aspirin EC 81 MG tablet Take 81 mg by mouth 2 (two) times daily.    [provider]  carvedilol (COREG) 3.125 MG tablet Take 1 tablet (3.125 mg total) by mouth 2 (two) times daily. 07/28/17   Arnoldo Lenis, MD  lisinopril (PRINIVIL,ZESTRIL) 2.5 MG tablet Take 1 tablet (2.5 mg total) by mouth daily. 07/28/17   Arnoldo Lenis, MD  nitroGLYCERIN (NITROSTAT) 0.4 MG SL tablet Place 1 tablet (0.4 mg total) under the tongue every 5 (five) minutes as needed for chest pain. 10/01/16   Arnoldo Lenis, MD  Omega-3 Fatty Acids (FISH OIL) 300 MG CAPS Take 2 capsules by mouth 2 (two) times daily.      [provider]    Family History Family History  Problem Relation Age of Onset  . Clotting disorder Mother   . Heart attack Mother   . Lung cancer Father   . Cancer Brother        prostate    Social History Social History   Tobacco Use  . Smoking  status: Current Every Day Smoker    Packs/day: 1.00    Years: 55.00    Pack years: 55.00    Types: Cigarettes    Start date: 08/13/1958  . Smokeless tobacco: Never Used  Substance Use Topics  . Alcohol use: No    Alcohol/week: 0.0 standard drinks  . Drug use: No     Allergies   Codeine and Statins   Review  of Systems Review of Systems  Constitutional: Negative for appetite change and fatigue.  HENT: Negative for congestion, ear discharge and sinus pressure.   Eyes: Negative for discharge.  Respiratory: Positive for shortness of breath. Negative for cough.   Cardiovascular: Negative for chest pain.  Gastrointestinal: Negative for abdominal pain and diarrhea.  Genitourinary: Negative for frequency and hematuria.  Musculoskeletal: Negative for back pain.  Skin: Negative for rash.  Neurological: Negative for seizures and headaches.  Psychiatric/Behavioral: Negative for hallucinations.     Physical Exam Updated Vital Signs BP (!) 145/82   Pulse 68   Temp 97.7 F (36.5 C) (Oral)   Resp (!) 24   Ht 5\' 2"  (1.575 m)   Wt 76.2 kg   SpO2 98%   BMI 30.73 kg/m   Physical Exam Constitutional:      Appearance: Normal appearance. He is well-developed.  HENT:     Head: Normocephalic.     Nose: Nose normal.  Eyes:     General: No scleral icterus.    Conjunctiva/sclera: Conjunctivae normal.  Neck:     Musculoskeletal: Neck supple.     Thyroid: No thyromegaly.  Cardiovascular:     Rate and Rhythm: Normal rate and regular rhythm.     Heart sounds: No murmur. No friction rub. No gallop.   Pulmonary:     Breath sounds: No stridor. No wheezing or rales.  Chest:     Chest wall: No tenderness.  Abdominal:     General: There is no distension.     Tenderness: There is no abdominal tenderness. There is no rebound.  Musculoskeletal: Normal range of motion.  Lymphadenopathy:     Cervical: No cervical adenopathy.  Skin:    Findings: No erythema or rash.  Neurological:      Mental Status: He is alert and oriented to person, place, and time.     Motor: No abnormal muscle tone.     Coordination: Coordination normal.  Psychiatric:        Behavior: Behavior normal.      ED Treatments / Results  Labs (all labs ordered are listed, but only abnormal results are displayed) Labs Reviewed  CBC WITH DIFFERENTIAL/PLATELET - Abnormal; Notable for the following components:      Result Value   RBC 5.82 (*)    HCT 53.3 (*)    RDW 16.0 (*)    All other components within normal limits  BASIC METABOLIC PANEL - Abnormal; Notable for the following components:   Glucose, Bld 109 (*)    BUN 24 (*)    All other components within normal limits  BRAIN NATRIURETIC PEPTIDE - Abnormal; Notable for the following components:   B Natriuretic Peptide 545.0 (*)    All other components within normal limits  HEPATIC FUNCTION PANEL - Abnormal; Notable for the following components:   Total Protein 6.1 (*)    Total Bilirubin 1.8 (*)    Bilirubin, Direct 0.4 (*)    Indirect Bilirubin 1.4 (*)    All other components within normal limits  LIPID PANEL - Abnormal; Notable for the following components:   HDL 31 (*)    All other components within normal limits  TROPONIN I    EKG EKG Interpretation  Date/Time:  Friday March 24 2018 11:48:13 EDT Ventricular Rate:  103 PR Interval:    QRS Duration: 96 QT Interval:  362 QTC Calculation: 474 R Axis:   108 Text Interpretation:  Atrial fibrillation with rapid ventricular response Rightward axis Cannot rule  out Anteroseptal infarct , age undetermined Abnormal ECG Confirmed by [name] Ferguson ([id number]) on 03/24/2018 1:29:12 PM   Radiology Dg Chest 2 View  Result Date: 03/24/2018 CLINICAL DATA:  Shortness of breath. EXAM: CHEST - 2 VIEW COMPARISON:  Radiographs of October 15, 2016. FINDINGS: Stable cardiomediastinal silhouette. Status post coronary bypass graft. No pneumothorax is noted. Mild lingular subsegmental atelectasis is noted.  Minimal right pleural effusion is noted with associated subsegmental atelectasis. Bony thorax is unremarkable. IMPRESSION: Minimal right pleural effusion with associated right basilar subsegmental atelectasis. Mild lingular subsegmental atelectasis. Electronically Signed   By: Marijo Conception, M.D.   On: 03/24/2018 12:23    Procedures Procedures (including critical care time)  Medications Ordered in ED Medications  furosemide (LASIX) injection 40 mg (has no administration in time range)  furosemide (LASIX) injection 40 mg (40 mg Intravenous Given 03/24/18 1304)     Initial Impression / Assessment and Plan / ED Course  I have reviewed the triage vital signs and the nursing notes.  Pertinent labs & imaging results that were available during my care of the patient were reviewed by me and considered in my medical decision making (see chart for details).        Patient with anasarca and congestive heart failure he will be admitted to the hospital for diuresis Final Clinical Impressions(s) / ED Diagnoses   Final diagnoses:  Systolic congestive heart failure, unspecified HF chronicity [facility])    ED Discharge Orders    None       [name] Ferguson, MD 03/24/18 1542

## 2018-03-24 NOTE — ED Triage Notes (Signed)
C/o SOB for last several days.  Edema to lower extremities  And face, per daughter-in-law.  Denies pain.  Productive  Cough thick white secretions.

## 2018-03-24 NOTE — Consult Note (Signed)
Cardiology Consultation:   Patient ID: Vincent Carter MRN: [id number]; DOB: [birth year]  Admit date: 03/24/2018 Date of Consult: 03/24/2018  Primary Care Provider: [name], MD Primary Cardiologist: Carlyle Dolly, MD  Primary Electrophysiologist:  None    Patient Profile:   Vincent Carter is a 73 y.o. male with a hx of coronary artery disease and ischemic cardiomyopathy who is being seen today for the evaluation of shortness of breath and fluid retention at the request of Dr. Roderic Palau.  History of Present Illness:   Vincent Carter is a 73 year old male with a history of coronary artery disease and CABG in 1997 with a redo in 2006.  Echocardiogram in February 2015 showed mildly reduced left ventricular systolic function, LVEF 45 to 50%.  He was last seen in the office by Dr. Harl Bowie on 10/01/2016.  He also has hypertension and hyperlipidemia.  He told me that he had the flu back in January and since that time he has become progressively more short of breath with exertion.  He said it was most notable over the past week.  He has become orthopneic but denies paroxysmal nocturnal dyspnea.  He denies exertional chest pain.  He has developed progressive bilateral leg edema above the knees.  He denies palpitations.  He has a history of vertigo but denies syncope.  He is here with his daughter-in-law, Vincent Carter, who works as a Chartered certified accountant and [employer].  She reports that he has put out an additional 650 cc of urine.  BNP elevated at 545.  Total bilirubin elevated at 1.8 with direct bilirubin of 0.4 and indirect of 1.4.  BUN elevated at 24.  Initial troponin is normal.  Hemoglobin is normal.  Chest x-ray shows minimal right pleural effusion with associated right basilar subsegmental atelectasis and mild lingular subsegmental atelectasis.    Past Medical History:  Diagnosis Date  . Arthritis   . ASCVD (arteriosclerotic cardiovascular disease) cardiologist--  is now dr  Roderic Palau branch (was dr Vincent Carter)    CABG in 1997 -- LIMA to LAD;  cath 07-17-2012 PCI and BMStenting;   RE-DO CABG X4  in  07-24-2004 three-vessel disease; cath in 08-24-2004 post CABG ef 40-45% w/ anteroapical hypokinesis; occlusion SVG  to  RI; patent RIMA to LAD; unsuccessful PCI in totally occluded RI graft.;  last myoview 09-09-2013, no ischemia, moderate inferior wall defect, ef 49%  . Chronic obstructive pulmonary disease ([place])   . Diverticulosis of colon   . Euthyroid goiter    with low TSH   . History of colon polyps    hyperplastic 2011  . History of diverticulitis   . History of kidney stones   . History of ST elevation myocardial infarction (STEMI)    07-17-2004   w/ extensive RV infact per cath report  . HOH (hard of hearing)    no hearing aid  . Hypertension   . Low TSH level    Clinically euthyroid  . Peripheral vascular disease (HCC)    Decreased distal pulses and abnormal Doppler  . Prostate cancer [facility]) urologist--  dr Jenny Reichmann wrenn   T1c, Gleason 3+4=7,  PSA 4.71,  vol 38cc  . Ptosis    Left, secondary to injury  . S/p bare metal coronary artery stent    x3  to RCA  07-17-2004    Past Surgical History:  Procedure Laterality Date  . APPENDECTOMY  1973  . CARDIAC CATHETERIZATION  08-24-2004  dr Lia Foyer   post re-do CABG/  Unsuccessful attempt  PCI of clotted SVG to RI/  anteroapical segment severely hypokinetic,  ef 40-45%/  fresh occlusion SVG to RI,  continued patency of SVG to PAD and RIMA to LAD /   dLM 80%  . CARDIOVASCULAR STRESS TEST  09-09-2013  dr Roderic Palau branch   no reversible ischemia,  moderately large defect noted in the inferior wall extending from  mid ventricle to apex also involves true ventricle apex and anteroapical wall/  hypokinesis of inferior wall in region of prior infarct/scarring/  LVEF 49%  . CATARACT EXTRACTION W/ INTRAOCULAR LENS  IMPLANT, BILATERAL  2006  . COLONOSCOPY W/ POLYPECTOMY  12-26-2009  . CORONARY ANGIOPLASTY WITH STENT  PLACEMENT  07-17-2004   dr Sabino Snipes   PCI w/ BMS x3 to RCA and balloon pump placement/  LVSF after revascularization , ef 65% w/ mild inferior hypokinesis/  dLM  70% extending into origins of LAD (70% ostial, 80% mid, D1 branch 90% ostial)  CFX (80% ostial OM, 70% after first branch) /   99% mRCA stented and no residual;  remains 80% origin of posterior LV branch/  most apical portion of PDA was embolized   . CORONARY ARTERY BYPASS GRAFT  1997   dr Merleen Nicely   LIMA to LAD  . CORONARY ARTERY BYPASS GRAFT  07-24-2004  dr gerhardt   RIMA to LAD,  SVG to OM,  SVG to PDA,  SVG to RI  . EYE SURGERY Left 1996   repair ocular injury  . ORIF RIGHT TIB-FIB FX  1990's  . PROSTATE BIOPSY    . RADIOACTIVE SEED IMPLANT N/A 05/02/2014   Procedure: RADIOACTIVE SEED IMPLANT;  Surgeon: Irine Seal, MD;  Location: [facility];  Service: Urology;  Laterality: N/A;  . TONSILLECTOMY  age 78  . TOTAL HIP ARTHROPLASTY Left 1983   RECONSTRUCTION/ THA/  SKIN AND BONE GRAFTS--  pt fell from 5 [place] off [place]  . TRANSTHORACIC ECHOCARDIOGRAM  [id number]   diastolic dysfunction/  ef 45-50%/  mild AV calcification without stenosis,  mild AR/  mild LAE and RAE/  trivial MR and TR         Inpatient Medications: Scheduled Meds:  Continuous Infusions:  PRN Meds:   Allergies:    Allergies  Allergen Reactions  . Codeine Nausea Only    Pill form of codeine causes nausea  . Statins Other (See Comments)    Myalgia     Social History:   Social History   Socioeconomic History  . Marital status: Widowed    Spouse name: Not on file  . Number of children: Not on file  . Years of education: Not on file  . Highest education level: Not on file  Occupational History  . Occupation: Retired  Scientific laboratory technician  . Financial resource strain: Not on file  . Food insecurity:    Worry: Not on file    Inability: Not on file  . Transportation needs:    Medical: Not on file    Non-medical: Not  on file  Tobacco Use  . Smoking status: Current Every Day Smoker    Packs/day: 1.00    Years: 55.00    Pack years: 55.00    Types: Cigarettes    Start date: 08/13/1958  . Smokeless tobacco: Never Used  Substance and Sexual Activity  . Alcohol use: No    Alcohol/week: 0.0 standard drinks  . Drug use: No  . Sexual activity: Not on file  Lifestyle  . Physical activity:  Days per week: Not on file    Minutes per session: Not on file  . Stress: Not on file  Relationships  . Social connections:    Talks on phone: Not on file    Gets together: Not on file    Attends religious service: Not on file    Active member of club or organization: Not on file    Attends meetings of clubs or organizations: Not on file    Relationship status: Not on file  . Intimate partner violence:    Fear of current or ex partner: Not on file    Emotionally abused: Not on file    Physically abused: Not on file    Forced sexual activity: Not on file  Other Topics Concern  . Not on file  Social History Narrative   Widowed   No regular exercise    Family History:   2 Family History  Problem Relation Age of Onset  . Clotting disorder Mother   . Heart attack Mother   . Lung cancer Father   . Cancer Brother        prostate     ROS:  Please see the history of present illness.   All other ROS reviewed and negative.     Physical Exam/Data:   Vitals:   03/24/18 1300 03/24/18 1345 03/24/18 1401 03/24/18 1411  BP: 135/85  (!) 165/103   Pulse: 90 (!) 46 96 (!) 55  Resp: (!) 28 (!) 35 (!) 23 (!) 32  Temp:      TempSrc:      SpO2: 97% 97% 93% 99%  Weight:      Height:        Intake/Output Summary (Last 24 hours) at 03/24/2018 1429 Last data filed at 03/24/2018 1412 Gross per 24 hour  Intake -  Output 850 ml  Net -850 ml   Last 3 Weights 03/24/2018 10/01/2016 09/16/2015  Weight (lbs) 168 lb 154 lb 155 lb  Weight (kg) 76.204 kg 69.854 kg 70.308 kg     Body mass index is 30.73 kg/m.  General:   Well nourished, well developed, in no acute distress  HEENT: normal Lymph: no adenopathy Neck: +JVD Endocrine:  No thryomegaly Cardiac:  normal S1, S2; irregular rhythm, mildly tachycardic; no murmur  Lungs: Diminished sounds at bilateral bases. Abd: soft, nontender, no hepatomegaly  Ext: 2+ pitting bilateral lower extremity edema to the mid thighs. Musculoskeletal:  No deformities, BUE and BLE strength normal and equal Skin: warm and dry  Neuro:  CNs 2-12 intact, no focal abnormalities noted Psych:  Normal affect   EKG:  The EKG was personally reviewed and demonstrates: Atrial fibrillation Telemetry:  Telemetry was personally reviewed and demonstrates: Atrial fibrillation  Relevant CV Studies:  02/2013 Echo Study Conclusions  - Study data: Technically difficult study. - Left ventricle: The cavity size was normal. Wall thickness was normal. Systolic function waslow normal tomildly reduced. The estimated ejection fraction was in the range of 45% to 50%. - Aortic valve: Mildly calcified annulus. Trileaflet; mildly thickened leaflets. Mild regurgitation. Valve area: 2.53cm^2(VTI). Valve area: 2.42cm^2 (Vmax). - Mitral valve: Mildly calcified annulus. Mildly thickened leaflets . - Left atrium: The atrium was mildly dilated. - Right ventricle: The cavity size was mildly dilated. - Right atrium: The atrium was mildly dilated. - Atrial septum: No defect or patent foramen ovale was identified.  Laboratory Data:  Chemistry Recent Labs  Lab 03/24/18 1235  NA 143  K 3.7  CL 109  CO2 26  GLUCOSE 109*  BUN 24*  CREATININE 1.12  CALCIUM 9.0  GFRNONAA >60  GFRAA >60  ANIONGAP 8    Recent Labs  Lab 03/24/18 1308  PROT 6.1*  ALBUMIN 3.7  AST 18  ALT 20  ALKPHOS 75  BILITOT 1.8*   Hematology Recent Labs  Lab 03/24/18 1235  WBC 8.5  RBC 5.82*  HGB 17.0  HCT 53.3*  MCV 91.6  MCH 29.2  MCHC 31.9  RDW 16.0*  PLT 152   Cardiac Enzymes Recent Labs  Lab  03/24/18 1235  TROPONINI <0.03   No results for input(s): TROPIPOC in the last 168 hours.  BNP Recent Labs  Lab 03/24/18 1235  BNP 545.0*    DDimer No results for input(s): DDIMER in the last 168 hours.  Radiology/Studies:  Dg Chest 2 View  Result Date: 03/24/2018 CLINICAL DATA:  Shortness of breath. EXAM: CHEST - 2 VIEW COMPARISON:  Radiographs of October 15, 2016. FINDINGS: Stable cardiomediastinal silhouette. Status post coronary bypass graft. No pneumothorax is noted. Mild lingular subsegmental atelectasis is noted. Minimal right pleural effusion is noted with associated subsegmental atelectasis. Bony thorax is unremarkable. IMPRESSION: Minimal right pleural effusion with associated right basilar subsegmental atelectasis. Mild lingular subsegmental atelectasis. Electronically Signed   By: Marijo Conception, M.D.   On: 03/24/2018 12:23    Assessment and Plan:   1.  Acute on chronic systolic heart failure: LVEF 45 to 50% by echocardiogram in February 2015.  I will obtain an echocardiogram to evaluate for interval changes in cardiac structure and function.  He has put out a total of 1.5 L of urine thus far after 1 dose of IV Lasix 40 mg.  I will start 40 mg twice daily with the next dose to be given at 8 PM today.  Continue carvedilol and lisinopril.  2.  Coronary artery disease: Symptomatically stable.  History of CABG in 1997 and redo in 2006. I will order a 2-D echocardiogram with Doppler to evaluate cardiac structure, function, and regional wall motion.  Continue carvedilol.  No aspirin as I am initiating systemic anticoagulation for new onset atrial fibrillation.  He is reportedly statin intolerant and takes fish oil.  3.  Hypertension: Blood pressure is significantly elevated.  Resume carvedilol and lisinopril.  4.  Hyperlipidemia: I will check lipids.  He is reportedly statin intolerant and takes fish oil.  He may be a candidate for Repatha.  5.  New onset atrial fibrillation: I will  resume home dose of carvedilol for rate control.  I will start Eliquis for systemic anticoagulation. CHADSVASC score is 4.    For questions or updates, please contact [place] Please consult [email or web address] for contact info under     Signed, Kate Sable, MD  03/24/2018 2:29 PM

## 2018-03-25 DIAGNOSIS — I351 Nonrheumatic aortic (valve) insufficiency: Secondary | ICD-10-CM | POA: Diagnosis present

## 2018-03-25 DIAGNOSIS — J9601 Acute respiratory failure with hypoxia: Secondary | ICD-10-CM | POA: Diagnosis present

## 2018-03-25 DIAGNOSIS — I252 Old myocardial infarction: Secondary | ICD-10-CM | POA: Diagnosis not present

## 2018-03-25 DIAGNOSIS — E049 Nontoxic goiter, unspecified: Secondary | ICD-10-CM | POA: Diagnosis present

## 2018-03-25 DIAGNOSIS — Z96642 Presence of left artificial hip joint: Secondary | ICD-10-CM | POA: Diagnosis present

## 2018-03-25 DIAGNOSIS — I5023 Acute on chronic systolic (congestive) heart failure: Secondary | ICD-10-CM | POA: Diagnosis not present

## 2018-03-25 DIAGNOSIS — I10 Essential (primary) hypertension: Secondary | ICD-10-CM | POA: Diagnosis not present

## 2018-03-25 DIAGNOSIS — I5043 Acute on chronic combined systolic (congestive) and diastolic (congestive) heart failure: Secondary | ICD-10-CM | POA: Diagnosis not present

## 2018-03-25 DIAGNOSIS — N179 Acute kidney failure, unspecified: Secondary | ICD-10-CM | POA: Diagnosis not present

## 2018-03-25 DIAGNOSIS — E039 Hypothyroidism, unspecified: Secondary | ICD-10-CM | POA: Diagnosis present

## 2018-03-25 DIAGNOSIS — M7989 Other specified soft tissue disorders: Secondary | ICD-10-CM | POA: Diagnosis present

## 2018-03-25 DIAGNOSIS — Z832 Family history of diseases of the blood and blood-forming organs and certain disorders involving the immune mechanism: Secondary | ICD-10-CM | POA: Diagnosis not present

## 2018-03-25 DIAGNOSIS — I255 Ischemic cardiomyopathy: Secondary | ICD-10-CM | POA: Diagnosis present

## 2018-03-25 DIAGNOSIS — I4891 Unspecified atrial fibrillation: Secondary | ICD-10-CM | POA: Diagnosis not present

## 2018-03-25 DIAGNOSIS — I739 Peripheral vascular disease, unspecified: Secondary | ICD-10-CM | POA: Diagnosis present

## 2018-03-25 DIAGNOSIS — I5020 Unspecified systolic (congestive) heart failure: Secondary | ICD-10-CM | POA: Diagnosis not present

## 2018-03-25 DIAGNOSIS — I2510 Atherosclerotic heart disease of native coronary artery without angina pectoris: Secondary | ICD-10-CM | POA: Diagnosis present

## 2018-03-25 DIAGNOSIS — T502X5A Adverse effect of carbonic-anhydrase inhibitors, benzothiadiazides and other diuretics, initial encounter: Secondary | ICD-10-CM | POA: Diagnosis not present

## 2018-03-25 DIAGNOSIS — Z801 Family history of malignant neoplasm of trachea, bronchus and lung: Secondary | ICD-10-CM | POA: Diagnosis not present

## 2018-03-25 DIAGNOSIS — I110 Hypertensive heart disease with heart failure: Secondary | ICD-10-CM | POA: Diagnosis not present

## 2018-03-25 DIAGNOSIS — K5730 Diverticulosis of large intestine without perforation or abscess without bleeding: Secondary | ICD-10-CM | POA: Diagnosis present

## 2018-03-25 DIAGNOSIS — H9190 Unspecified hearing loss, unspecified ear: Secondary | ICD-10-CM | POA: Diagnosis present

## 2018-03-25 DIAGNOSIS — Z8719 Personal history of other diseases of the digestive system: Secondary | ICD-10-CM | POA: Diagnosis not present

## 2018-03-25 DIAGNOSIS — J441 Chronic obstructive pulmonary disease with (acute) exacerbation: Secondary | ICD-10-CM | POA: Diagnosis not present

## 2018-03-25 DIAGNOSIS — F17210 Nicotine dependence, cigarettes, uncomplicated: Secondary | ICD-10-CM | POA: Diagnosis present

## 2018-03-25 DIAGNOSIS — E785 Hyperlipidemia, unspecified: Secondary | ICD-10-CM | POA: Diagnosis not present

## 2018-03-25 DIAGNOSIS — Z87442 Personal history of urinary calculi: Secondary | ICD-10-CM | POA: Diagnosis not present

## 2018-03-25 LAB — HEPATIC FUNCTION PANEL
ALK PHOS: 78 U/L (ref 38–126)
ALT: 20 U/L (ref 0–44)
AST: 15 U/L (ref 15–41)
Albumin: 3.7 g/dL (ref 3.5–5.0)
Bilirubin, Direct: 0.4 mg/dL — ABNORMAL HIGH (ref 0.0–0.2)
Indirect Bilirubin: 1 mg/dL — ABNORMAL HIGH (ref 0.3–0.9)
Total Bilirubin: 1.4 mg/dL — ABNORMAL HIGH (ref 0.3–1.2)
Total Protein: 6.4 g/dL — ABNORMAL LOW (ref 6.5–8.1)

## 2018-03-25 LAB — BASIC METABOLIC PANEL
Anion gap: 9 (ref 5–15)
BUN: 21 mg/dL (ref 8–23)
CO2: 25 mmol/L (ref 22–32)
Calcium: 8.7 mg/dL — ABNORMAL LOW (ref 8.9–10.3)
Chloride: 105 mmol/L (ref 98–111)
Creatinine, Ser: 1.02 mg/dL (ref 0.61–1.24)
GFR calc Af Amer: >60 mL/min (ref >60)
GFR calc non Af Amer: >60 mL/min (ref >60)
GLUCOSE: 117 mg/dL — AB (ref 70–99)
Potassium: 3.7 mmol/L (ref 3.5–5.1)
Sodium: 139 mmol/L (ref 135–145)

## 2018-03-25 LAB — TSH: TSH: 0.594 u[IU]/mL (ref 0.350–4.500)

## 2018-03-25 MED ORDER — FUROSEMIDE 10 MG/ML IJ SOLN
40.0000 mg | Freq: Once | INTRAMUSCULAR | Status: AC
  Administered 2018-03-25: 40 mg via INTRAVENOUS
  Filled 2018-03-25: qty 4

## 2018-03-25 MED ORDER — BUDESONIDE 0.25 MG/2ML IN SUSP
0.2500 mg | Freq: Two times a day (BID) | RESPIRATORY_TRACT | Status: DC
  Administered 2018-03-25 – 2018-03-30 (×11): 0.25 mg via RESPIRATORY_TRACT
  Filled 2018-03-25 (×12): qty 2

## 2018-03-25 MED ORDER — DM-GUAIFENESIN ER 30-600 MG PO TB12
1.0000 | ORAL_TABLET | Freq: Two times a day (BID) | ORAL | Status: DC
  Administered 2018-03-25 – 2018-03-30 (×11): 1 via ORAL
  Filled 2018-03-25 (×11): qty 1

## 2018-03-25 MED ORDER — GUAIFENESIN-DM 100-10 MG/5ML PO SYRP
5.0000 mL | ORAL_SOLUTION | ORAL | Status: DC | PRN
  Administered 2018-03-25 – 2018-03-30 (×6): 5 mL via ORAL
  Filled 2018-03-25 (×6): qty 5

## 2018-03-25 MED ORDER — DILTIAZEM HCL 25 MG/5ML IV SOLN
10.0000 mg | Freq: Once | INTRAVENOUS | Status: AC
  Administered 2018-03-25: 10 mg via INTRAVENOUS
  Filled 2018-03-25: qty 5

## 2018-03-25 MED ORDER — IPRATROPIUM-ALBUTEROL 0.5-2.5 (3) MG/3ML IN SOLN
3.0000 mL | RESPIRATORY_TRACT | Status: DC
  Administered 2018-03-25 – 2018-03-27 (×11): 3 mL via RESPIRATORY_TRACT
  Filled 2018-03-25 (×12): qty 3

## 2018-03-25 MED ORDER — CARVEDILOL 3.125 MG PO TABS
6.2500 mg | ORAL_TABLET | Freq: Two times a day (BID) | ORAL | Status: DC
  Administered 2018-03-25 – 2018-03-26 (×2): 6.25 mg via ORAL
  Filled 2018-03-25 (×2): qty 2

## 2018-03-25 NOTE — Progress Notes (Addendum)
PROGRESS NOTE                                                                                                                                                                                                             Patient Demographics:    Vincent Carter, is a 73 y.o. male, DOB - [birth year], VHQ:[id number]  Admit date - 03/24/2018   Admitting Physician Truett Mainland, DO  Outpatient Primary MD for the patient is [name], MD  LOS - 0  Outpatient Specialists: None  Chief Complaint  Patient presents with  . 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  Patient found to be in acute on chronic systolic CHF, COPD with acute exacerbation and new onset A. fib.  Patient admitted to medical floor on telemetry and cardiology consulted.   Subjective:   Reports his breathing has not improved since admission and still coughing and wheezing.  Negative balance of 2.3 L since admission.   Assessment  & Plan :    Principal Problem:   Acute on chronic systolic CHF (congestive heart failure) (HCC) EF of 45% as per echo in 2015.  On IV Lasix 40 mg twice daily.  I have ordered another dose of IV Lasix 40 mg for this afternoon since patient still quite symptomatic.  Repeat 2D echo shows EF of 40-45% with LVH.  Increase Coreg dose to 6.25 mg twice daily.  Continue aspirin, statin and lisinopril.  LDL of 48.  Continue potassium supplement. Strict I's/O and daily weight. Cardiology consult appreciated.  Will follow on 3/16.  Active Problems: Acute respiratory failure with hypoxia (HCC) COPD with acute exacerbation. Patient still quite wheezy and dyspneic.  Increase DuoNeb  to every 4 hours.  Continue oral prednisone and add Pulmicort neb twice daily. Mucinex twice daily.  Hold off on antibiotic for now. Continue O2 via nasal cannula.   New onset A. fib with RVR (HCC) Heart rate in the 120s.  Increased Coreg dose to 6.25 mg twice daily.  Ordered one-time dose of IV Cardizem 10 mg.  CHADS2VASC of 4.  Started on  Eliquis by cardiology. Patient has history of hypothyroidism (not on any medication currently).  Check TSH.   Tobacco abuse Counseled strongly on smoking cessation.  Add nicotine patch.  Status: Inpatient Patient presenting with acute on chronic systolic CHF now with acute hypoxic respiratory failure, ongoing CHF symptoms, COPD with acute exacerbation and new onset A. fib with RVR.  Patient needs to be monitored closely in the inpatient setting for at least >2 midnight for IV diuresis, oxygen therapy, scheduled nebulizer therapy, adequate heart rate control for rapid A. fib.  code Status : Full code  Family Communication  : None at bedside  Disposition Plan  : Home pending clinical improvement, possibly in the next 72 hours  Barriers For Discharge : Active symptoms  Consults  : Cardiology  Procedures  : 2D echo  DVT Prophylaxis  : Eliquis  Lab Results  Component Value Date   PLT 152 03/24/2018    Antibiotics  :   Anti-infectives (From admission, onward)   None        Objective:   Vitals:   03/25/18 0800 03/25/18 0925 03/25/18 0932 03/25/18 1133  BP:      Pulse:      Resp:      Temp:      TempSrc:      SpO2: 95% 98% 97% 96%  Weight:      Height:        Wt Readings from Last 3 Encounters:  03/25/18 73.6 kg  10/01/16 69.9 kg  09/16/15 70.3 kg     Intake/Output Summary (Last 24 hours) at 03/25/2018 1151 Last data filed at 03/25/2018 0942 Gross per 24 hour  Intake 835 ml  Output 3175 ml  Net -2340 ml     Physical Exam  Gen: In some respiratory distress, fatigued HEENT: no pallor, moist mucosa, supple neck, JVD +  Chest: Diffuse wheezing bilaterally CVS: S1-S2 irregularly irregular, tachycardic, no murmurs rub or gallop GI: soft, NT, ND, BS+, scrotal swelling + Musculoskeletal: 3+ pitting edema bilaterally CNS: Alert and oriented, nonfocal    Data Review:    CBC Recent Labs  Lab 03/24/18 1235  WBC 8.5  HGB 17.0  HCT 53.3*  PLT 152  MCV 91.6  MCH 29.2  MCHC 31.9  RDW 16.0*  LYMPHSABS 1.8  MONOABS 1.0  EOSABS 0.3  BASOSABS 0.1    Chemistries  Recent Labs  Lab 03/24/18 1235 03/24/18 1308 03/25/18 0626  NA 143  --  139  K 3.7  --  3.7  CL 109  --  105  CO2 26  --  25  GLUCOSE 109*  --  117*  BUN 24*  --  21  CREATININE 1.12  --  1.02  CALCIUM 9.0  --  8.7*  AST  --  18 15  ALT  --  20 20  ALKPHOS  --  75 78  BILITOT  --  1.8* 1.4*   ------------------------------------------------------------------------------------------------------------------ Recent Labs    03/24/18 1235  CHOL 97  HDL 31*  LDLCALC 48  TRIG 92  CHOLHDL 3.1    No results found for: HGBA1C ------------------------------------------------------------------------------------------------------------------ No results for input(s): TSH, T4TOTAL, T3FREE, THYROIDAB in the last 72 hours.  Invalid input(s): FREET3 ------------------------------------------------------------------------------------------------------------------ No results for input(s): VITAMINB12, FOLATE, FERRITIN, TIBC, IRON, RETICCTPCT in the last 72 hours.  Coagulation profile No results for input(s): INR, PROTIME in the last 168 hours.  No results for input(s): DDIMER in the last 72 hours.  Cardiac Enzymes Recent Labs  Lab  03/24/18 1235  TROPONINI <0.03   ------------------------------------------------------------------------------------------------------------------    Component Value Date/Time   BNP 545.0 (H) 03/24/2018 1235    Inpatient Medications  Scheduled Meds: . apixaban  5 mg Oral BID  . aspirin EC  81 mg  Oral BID  . budesonide (PULMICORT) nebulizer solution  0.25 mg Nebulization BID  . carvedilol  6.25 mg Oral BID  . furosemide  40 mg Intravenous BID  . furosemide  40 mg Intravenous Once  . ipratropium-albuterol  3 mL Nebulization Q4H  . lisinopril  2.5 mg Oral Daily  . potassium chloride  40 mEq Oral BID  . predniSONE  40 mg Oral Q breakfast  . sodium chloride flush  3 mL Intravenous Q12H   Continuous Infusions: . sodium chloride     PRN Meds:.sodium chloride, acetaminophen, albuterol, guaiFENesin-dextromethorphan, ondansetron (ZOFRAN) IV, sodium chloride flush  Micro Results No results found for this or any previous visit (from the past 240 hour(s)).  Radiology Reports Dg Chest 2 View  Result Date: 03/24/2018 CLINICAL DATA:  Shortness of breath. EXAM: CHEST - 2 VIEW COMPARISON:  Radiographs of October 15, 2016. FINDINGS: Stable cardiomediastinal silhouette. Status post coronary bypass graft. No pneumothorax is noted. Mild lingular subsegmental atelectasis is noted. Minimal right pleural effusion is noted with associated subsegmental atelectasis. Bony thorax is unremarkable. IMPRESSION: Minimal right pleural effusion with associated right basilar subsegmental atelectasis. Mild lingular subsegmental atelectasis. Electronically Signed   By: Marijo Conception, M.D.   On: 03/24/2018 12:23    Time Spent in minutes  35   Joselin Crandell M.D on 03/25/2018 at 11:51 AM  Between 7am to 7pm - Pager - [phone]  After 7pm go to [email or web address] - password [facility]  [facility] -  Office  [phone]

## 2018-03-25 NOTE — Progress Notes (Signed)
Nutrition Brief Note  RD consulted as part of COPD Gold protocol.  Wt Readings from Last 15 Encounters:  03/25/18 73.6 kg  10/01/16 69.9 kg  09/16/15 70.3 kg  08/13/14 68.9 kg  05/30/14 68.4 kg  05/02/14 68 kg  03/06/14 69.4 kg  09/08/13 64.5 kg  06/21/13 69.4 kg  02/21/13 68 kg  03/08/12 68.9 kg  03/09/11 68.6 kg  02/02/11 67.1 kg  01/14/11 67.6 kg  12/31/10 68 kg   RD operating remotely on weekends. Per chart, though he has a history of COPD, pts SOB appears to be more r/t HF. Pt denied having any n/v/d on admission to ED. His MST screen was negative for unintentional weight or decreased appetite.   Per chart, he is currently eating 75% of meals. His weight is up, though this is likely d/t edema. Per chart, prior to this admission, his weight had been 67-69 kg for >5 years.   Without any apparent decrease in PO intake or discernible weight loss, no nutrition interventions are warranted at this time. Per chart, it appears pts HF exacerbation was triggered by acute illness (flu), however if poor diet is felt to be playing role, please consult RD for diet education.   Burtis Junes RD, LDN, CNSC Clinical Nutrition Available Tues-Sat via Pager: [phone] 03/25/2018 12:20 PM

## 2018-03-26 LAB — BASIC METABOLIC PANEL
Anion gap: 8 (ref 5–15)
BUN: 24 mg/dL — ABNORMAL HIGH (ref 8–23)
CO2: 28 mmol/L (ref 22–32)
Calcium: 9.2 mg/dL (ref 8.9–10.3)
Chloride: 105 mmol/L (ref 98–111)
Creatinine, Ser: 1.25 mg/dL — ABNORMAL HIGH (ref 0.61–1.24)
GFR calc Af Amer: >60 mL/min (ref >60)
GFR calc non Af Amer: 56 mL/min — ABNORMAL LOW (ref >60)
GLUCOSE: 95 mg/dL (ref 70–99)
Potassium: 4.8 mmol/L (ref 3.5–5.1)
Sodium: 141 mmol/L (ref 135–145)

## 2018-03-26 LAB — MAGNESIUM: Magnesium: 2.2 mg/dL (ref 1.7–2.4)

## 2018-03-26 MED ORDER — FUROSEMIDE 10 MG/ML IJ SOLN
40.0000 mg | Freq: Once | INTRAMUSCULAR | Status: AC
  Administered 2018-03-26: 40 mg via INTRAVENOUS
  Filled 2018-03-26: qty 4

## 2018-03-26 MED ORDER — DILTIAZEM HCL 25 MG/5ML IV SOLN
10.0000 mg | Freq: Four times a day (QID) | INTRAVENOUS | Status: DC | PRN
  Administered 2018-03-28: 10 mg via INTRAVENOUS
  Filled 2018-03-26: qty 5

## 2018-03-26 MED ORDER — CARVEDILOL 12.5 MG PO TABS
12.5000 mg | ORAL_TABLET | Freq: Two times a day (BID) | ORAL | Status: DC
  Administered 2018-03-26: 12.5 mg via ORAL
  Filled 2018-03-26 (×3): qty 1

## 2018-03-26 NOTE — Progress Notes (Signed)
CCMD called at 0620 and stated patient had a 6 beat run of wide QRS complex. MD notified. No further orders given.

## 2018-03-26 NOTE — Progress Notes (Addendum)
PROGRESS NOTE                                                                                                                                                                                                             Patient Demographics:    Vincent Carter, is a 73 y.o. male, DOB - July 07, 1945, OIN:[id number]  Admit date - 03/24/2018   Admitting Physician Truett Mainland, DO  Outpatient Primary MD for the patient is [name], MD  LOS - 1  Outpatient Specialists: None  Chief Complaint  Patient presents with  . 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  Patient found to be in acute on chronic systolic CHF, COPD with acute exacerbation and new onset A. fib.  Patient admitted to medical floor on telemetry and cardiology consulted.   Subjective:   Reports he is able to breathe better this morning but still not anywhere close to baseline.  Assessment  & Plan :    Principal Problem:   Acute on chronic systolic CHF (congestive heart failure) (HCC) Continue IV Lasix 40 mg twice daily.  Ordered another 40 mg dose for this afternoon.  Negative balance of -4.2 L since admission.   Repeat 2D echo shows EF of 40-45% with LVH.  Increase Coreg dose to 6.25 mg twice daily.  Continue aspirin, statin and lisinopril.  LDL of 48.  Continue potassium supplement. Strict I's/O and daily weight. Cardiology consult appreciated.  Will follow on 3/16.  Active Problems: Acute respiratory failure with hypoxia (HCC) COPD with acute exacerbation. Maintaining sats on 2 L, wheezing better this morning.  Continue DuoNeb every 4 hour, oral prednisone and Pulmicort neb.  Continue  Mucinex twice daily.  Does not require antibiotic.   New onset A. fib with RVR (HCC) Heart rate still in the 120s.  Increase Coreg dose further to 12.5 mg twice daily.  Placed on IV Cardizem as needed.  If not controlled we will place him on scheduled Cardizem as well. CHADS2VASC of 4.  Started on Eliquis by cardiology. Patient has history of hypothyroidism (not on any medication  currently).  TSH normal.  2D echo reviewed.  Acute kidney injury Mild secondary to diuresis.  Creatinine at 1.25 elevated from baseline of 0.9.  Monitor on current dose of Lasix.  Wide QRS Reportedly had 6 runs of wide QRS complex on the monitor.  Potassium and magnesium normal.  Continue to monitor.   Tobacco abuse Counseled strongly on smoking cessation.  Continue nicotine patch.    code Status : Full code  Family Communication  : None at bedside  Disposition Plan  : Home pending improvement in his respiratory symptoms, CHF and rapid A. fib.  Barriers For Discharge : Active symptoms  Consults  : Cardiology  Procedures  : 2D echo  DVT Prophylaxis  : Eliquis  Lab Results  Component Value Date   PLT 152 03/24/2018    Antibiotics  :   Anti-infectives (From admission, onward)   None        Objective:   Vitals:   03/25/18 1932 03/25/18 2137 03/26/18 0601 03/26/18 0844  BP:  129/83 (!) 129/93   Pulse: 64 100 65   Resp: 20 20 20    Temp:  98 F (36.7 C) 97.7 F (36.5 C)   TempSrc:  Oral Oral   SpO2: 98% 96% 99% 95%  Weight:   71.6 kg   Height:        Wt Readings from Last 3 Encounters:  03/26/18 71.6 kg  10/01/16 69.9 kg  09/16/15 70.3 kg     Intake/Output Summary (Last 24 hours) at 03/26/2018 1053 Last data filed at 03/26/2018 0900 Gross per 24 hour  Intake 723 ml  Output 2400 ml  Net -1677 ml    Physical exam Not in distress HEENT: Moist mucosa, supple neck, JVD + Chest: Scattered wheezing bilaterally CVs: S1-S2 irregularly irregular, tachycardic, no murmurs rub or  gallop GI: Soft, nondistended, nontender, bowel sounds present, scrotal swelling improved Musculoskeletal: 2+ pitting edema bilaterally    Data Review:    CBC Recent Labs  Lab 03/24/18 1235  WBC 8.5  HGB 17.0  HCT 53.3*  PLT 152  MCV 91.6  MCH 29.2  MCHC 31.9  RDW 16.0*  LYMPHSABS 1.8  MONOABS 1.0  EOSABS 0.3  BASOSABS 0.1    Chemistries  Recent Labs  Lab 03/24/18 1235 03/24/18 1308 03/25/18 0626 03/26/18 0626  NA 143  --  139 141  K 3.7  --  3.7 4.8  CL 109  --  105 105  CO2 26  --  25 28  GLUCOSE 109*  --  117* 95  BUN 24*  --  21 24*  CREATININE 1.12  --  1.02 1.25*  CALCIUM 9.0  --  8.7* 9.2  MG  --   --   --  2.2  AST  --  18 15  --   ALT  --  20 20  --   ALKPHOS  --  75 78  --   BILITOT  --  1.8* 1.4*  --    ------------------------------------------------------------------------------------------------------------------ Recent Labs    03/24/18 1235  CHOL 97  HDL 31*  LDLCALC 48  TRIG 92  CHOLHDL 3.1    No results found for: HGBA1C ------------------------------------------------------------------------------------------------------------------ Recent Labs    03/25/18 0626  TSH 0.594   ------------------------------------------------------------------------------------------------------------------ No results for input(s): VITAMINB12, FOLATE, FERRITIN, TIBC, IRON, RETICCTPCT in the last 72 hours.  Coagulation profile No results for input(s): INR, PROTIME in the last 168 hours.  No results for input(s): DDIMER in the last 72 hours.  Cardiac Enzymes Recent Labs  Lab 03/24/18 1235  TROPONINI <0.03   ------------------------------------------------------------------------------------------------------------------    Component Value Date/Time   BNP 545.0 (H) 03/24/2018 1235    Inpatient Medications  Scheduled Meds: . apixaban  5 mg Oral BID  . aspirin EC  81 mg Oral BID  . budesonide (PULMICORT) nebulizer solution  0.25 mg  Nebulization BID  . carvedilol  12.5 mg Oral BID  . dextromethorphan-guaiFENesin  1 tablet Oral BID  . furosemide  40 mg Intravenous BID  . furosemide  40 mg Intravenous Once  . ipratropium-albuterol  3 mL Nebulization Q4H  . lisinopril  2.5 mg Oral Daily  . potassium chloride  40 mEq Oral BID  . predniSONE  40 mg Oral Q breakfast  . sodium chloride flush  3 mL Intravenous Q12H   Continuous Infusions: . sodium chloride     PRN Meds:.sodium chloride, acetaminophen, albuterol, diltiazem, guaiFENesin-dextromethorphan, ondansetron (ZOFRAN) IV, sodium chloride flush  Micro Results No results found for this or any previous visit (from the past 240 hour(s)).  Radiology Reports Dg Chest 2 View  Result Date: 03/24/2018 CLINICAL DATA:  Shortness of breath. EXAM: CHEST - 2 VIEW COMPARISON:  Radiographs of October 15, 2016. FINDINGS: Stable cardiomediastinal silhouette. Status post coronary bypass graft. No pneumothorax is noted. Mild lingular subsegmental atelectasis is noted. Minimal right pleural effusion is noted with associated subsegmental atelectasis. Bony thorax is unremarkable. IMPRESSION: Minimal right pleural effusion with associated right basilar subsegmental atelectasis. Mild lingular subsegmental atelectasis. Electronically Signed   By: Marijo Conception, M.D.   On: 03/24/2018 12:23    Time Spent in minutes  35   Iceis Knab M.D on 03/26/2018 at 10:53 AM  Between 7am to 7pm - Pager - [phone]  After 7pm go to [email or web address] - password [facility]  [facility] -  Office  [phone]

## 2018-03-27 DIAGNOSIS — I5020 Unspecified systolic (congestive) heart failure: Secondary | ICD-10-CM

## 2018-03-27 LAB — BASIC METABOLIC PANEL
ANION GAP: 9 (ref 5–15)
BUN: 34 mg/dL — ABNORMAL HIGH (ref 8–23)
CO2: 29 mmol/L (ref 22–32)
Calcium: 8.7 mg/dL — ABNORMAL LOW (ref 8.9–10.3)
Chloride: 101 mmol/L (ref 98–111)
Creatinine, Ser: 1.47 mg/dL — ABNORMAL HIGH (ref 0.61–1.24)
GFR calc Af Amer: 54 mL/min — ABNORMAL LOW (ref >60)
GFR calc non Af Amer: 46 mL/min — ABNORMAL LOW (ref >60)
Glucose, Bld: 66 mg/dL — ABNORMAL LOW (ref 70–99)
Potassium: 3.7 mmol/L (ref 3.5–5.1)
Sodium: 139 mmol/L (ref 135–145)

## 2018-03-27 MED ORDER — CARVEDILOL 3.125 MG PO TABS
18.7500 mg | ORAL_TABLET | Freq: Two times a day (BID) | ORAL | Status: DC
  Administered 2018-03-27 – 2018-03-30 (×8): 18.75 mg via ORAL
  Filled 2018-03-27 (×7): qty 1
  Filled 2018-03-27 (×2): qty 2

## 2018-03-27 MED ORDER — IPRATROPIUM-ALBUTEROL 0.5-2.5 (3) MG/3ML IN SOLN
3.0000 mL | Freq: Three times a day (TID) | RESPIRATORY_TRACT | Status: DC
  Administered 2018-03-27 – 2018-03-30 (×9): 3 mL via RESPIRATORY_TRACT
  Filled 2018-03-27 (×11): qty 3

## 2018-03-27 MED ORDER — FUROSEMIDE 10 MG/ML IJ SOLN
40.0000 mg | Freq: Every day | INTRAMUSCULAR | Status: DC
  Administered 2018-03-27 – 2018-03-28 (×2): 40 mg via INTRAVENOUS
  Filled 2018-03-27: qty 4

## 2018-03-27 NOTE — Progress Notes (Signed)
PROGRESS NOTE                                                                                                                                                                                                             Patient Demographics:    Vincent Carter, is a 73 y.o. male, DOB - 1945/11/13, BEE:[id number]  Admit date - 03/24/2018   Admitting Physician Truett Mainland, DO  Outpatient Primary MD for the patient is Vincent School, MD  LOS - 2  Outpatient Specialists: None  Chief Complaint  Patient presents with  . 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  Patient found to be in acute on chronic systolic CHF, COPD with acute exacerbation and new onset A. fib.  Patient admitted to medical floor on telemetry and cardiology consulted.   Subjective:   Continues to have some wheezing and cough. No chest pain.  Assessment  & Plan :    Principal Problem:   Acute on chronic systolic CHF (congestive heart failure) ([place]) He is currently diuresing with IV lasix.  Negative balance of -5.7 L since admission.   Repeat 2D echo shows EF of 40-45% with LVH. Continue aspirin, BB, statin and lisinopril.  LDL of 48.  Continue potassium supplement. Strict I's/O and daily weight. Cardiology consult appreciated.    Active Problems: Acute respiratory failure with hypoxia (HCC) COPD with acute exacerbation. Maintaining sats on 2 L, wheezing better this morning.  Continue DuoNeb every 4 hour, oral prednisone and Pulmicort neb.  Continue Mucinex twice daily.  Does not require antibiotic.  New onset A. fib with RVR ([place]) Still having bursts of tachycardia.  Coreg dose increased  today.  Placed on IV Cardizem as needed.  If not controlled we will place him on scheduled Cardizem as well. CHADS2VASC of 4.  Started on Eliquis by cardiology. Patient has history of hypothyroidism (not on any medication currently).  TSH normal.  2D echo reviewed.  Acute kidney injury Mild secondary to diuresis.  Creatinine at 1.47 elevated from baseline of 0.9.  Lasix dose has been decreased. Continue to monitor.  Tobacco abuse Counseled strongly on smoking cessation.  Continue nicotine patch.    code Status : Full code  Family Communication  : None at bedside  Disposition Plan  : Home pending improvement in his respiratory symptoms, CHF and rapid A. fib.  Barriers For Discharge : Active symptoms  Consults  : Cardiology  Procedures  : 2D echo  DVT Prophylaxis  : Eliquis  Lab Results  Component Value Date   PLT 152 03/24/2018    Antibiotics  :   Anti-infectives (From admission, onward)   None        Objective:   Vitals:   03/27/18 0721 03/27/18 0934 03/27/18 1257 03/27/18 1415  BP:    100/72  Pulse:    60  Resp:    20  Temp:    (!) 97.4 F (36.3 C)  TempSrc:    Oral  SpO2: 97%  94% 91%  Weight:  70.3 kg    Height:        Wt Readings from Last 3 Encounters:  03/27/18 70.3 kg  10/01/16 69.9 kg  09/16/15 70.3 kg     Intake/Output Summary (Last 24 hours) at 03/27/2018 1836 Last data filed at 03/27/2018 1600 Gross per 24 hour  Intake 240 ml  Output 2450 ml  Net -2210 ml    Physical exam General exam: Alert, awake, oriented x 3 Respiratory system: bilateral wheezing. Respiratory effort normal. Cardiovascular system:RRR. No murmurs, rubs, gallops. Gastrointestinal system: Abdomen is nondistended, soft and nontender. No organomegaly or masses felt. Normal bowel sounds heard. Central nervous system: Alert and oriented. No focal neurological deficits. Extremities: 1+ edema bilaterally Skin: No rashes, lesions or ulcers Psychiatry: Judgement and insight  appear normal. Mood & affect appropriate.      Data Review:    CBC Recent Labs  Lab 03/24/18 1235  WBC 8.5  HGB 17.0  HCT 53.3*  PLT 152  MCV 91.6  MCH 29.2  MCHC 31.9  RDW 16.0*  LYMPHSABS 1.8  MONOABS 1.0  EOSABS 0.3  BASOSABS 0.1    Chemistries  Recent Labs  Lab 03/24/18 1235 03/24/18 1308 03/25/18 0626 03/26/18 0626 03/27/18 0551  NA 143  --  139 141 139  K 3.7  --  3.7 4.8 3.7  CL 109  --  105 105 101  CO2 26  --  25 28 29   GLUCOSE 109*  --  117* 95 66*  BUN 24*  --  21 24* 34*  CREATININE 1.12  --  1.02 1.25* 1.47*  CALCIUM 9.0  --  8.7* 9.2 8.7*  MG  --   --   --  2.2  --   AST  --  18 15  --   --   ALT  --  20 20  --   --   ALKPHOS  --  75 78  --   --   BILITOT  --  1.8* 1.4*  --   --    ------------------------------------------------------------------------------------------------------------------ No results for input(s): CHOL, HDL, LDLCALC, TRIG, CHOLHDL, LDLDIRECT in the last 72 hours.  No results found for: HGBA1C ------------------------------------------------------------------------------------------------------------------ Recent Labs    03/25/18 0626  TSH 0.594   ------------------------------------------------------------------------------------------------------------------ No results for input(s): VITAMINB12, FOLATE, FERRITIN, TIBC, IRON, RETICCTPCT in the last 72 hours.  Coagulation profile No results for input(s): INR, PROTIME in the last 168 hours.  No results for input(s): DDIMER in the last 72 hours.  Cardiac Enzymes Recent Labs  Lab 03/24/18 1235  TROPONINI <0.03   ------------------------------------------------------------------------------------------------------------------  Component Value Date/Time   BNP 545.0 (H) 03/24/2018 1235    Inpatient Medications  Scheduled Meds: . apixaban  5 mg Oral BID  . aspirin EC  81 mg Oral BID  . budesonide (PULMICORT) nebulizer solution  0.25 mg Nebulization BID  .  carvedilol  18.75 mg Oral BID WC  . dextromethorphan-guaiFENesin  1 tablet Oral BID  . furosemide  40 mg Intravenous Daily  . ipratropium-albuterol  3 mL Nebulization TID  . lisinopril  2.5 mg Oral Daily  . potassium chloride  40 mEq Oral BID  . predniSONE  40 mg Oral Q breakfast  . sodium chloride flush  3 mL Intravenous Q12H   Continuous Infusions: . sodium chloride     PRN Meds:.sodium chloride, acetaminophen, albuterol, diltiazem, guaiFENesin-dextromethorphan, ondansetron (ZOFRAN) IV, sodium chloride flush  Micro Results No results found for this or any previous visit (from the past 240 hour(s)).  Radiology Reports Dg Chest 2 View  Result Date: 03/24/2018 CLINICAL DATA:  Shortness of breath. EXAM: CHEST - 2 VIEW COMPARISON:  Radiographs of October 15, 2016. FINDINGS: Stable cardiomediastinal silhouette. Status post coronary bypass graft. No pneumothorax is noted. Mild lingular subsegmental atelectasis is noted. Minimal right pleural effusion is noted with associated subsegmental atelectasis. Bony thorax is unremarkable. IMPRESSION: Minimal right pleural effusion with associated right basilar subsegmental atelectasis. Mild lingular subsegmental atelectasis. Electronically Signed   By: Marijo Conception, M.D.   On: 03/24/2018 12:23    Time Spent in minutes  35   Kathie Dike M.D on 03/27/2018 at 6:36 PM  Between 7am to 7pm   After 7pm go to [email or web address]   [facility] -  Office  [phone]

## 2018-03-27 NOTE — Plan of Care (Signed)

## 2018-03-27 NOTE — Evaluation (Signed)
Physical Therapy Evaluation Patient Details Name: Vincent Carter MRN: [id number] DOB: Feb 25, 1945 Today's Date: 03/27/2018   History of Present Illness  Vincent Carter is a 73 y.o. male with a history of hypertension, coronary artery disease, CABG in 8 with revision and 2006.  Patient seen with shortness of breath for the last several days with edema to the lower extremities and the face.  His shortness of breath is worse with exertion and improved with rest.  He does have orthopnea.  Does have a cough with thick secretions.  No fevers, chills, nausea, vomiting.    Clinical Impression  Physical therapy evaluation completed, patient is at baseline and no further PT services recommended at this time. Pt on room air throughout treatment session with O2 ranging from 91-95%. Pt with moderate shortness of breath during gait training, improves with standing rest break, with normal gait pattern, no loss of balance and able to ascend/descend sloped surfaces independently without AD. Patient discharged to care of nursing for ambulation daily as tolerated for length of stay.     Follow Up Recommendations No PT follow up    Equipment Recommendations  None recommended by PT    Recommendations for Other Services       Precautions / Restrictions Precautions Precautions: None Restrictions Weight Bearing Restrictions: No      Mobility  Bed Mobility Overal bed mobility: Independent                Transfers Overall transfer level: Independent Equipment used: None                Ambulation/Gait Ambulation/Gait assistance: Independent Gait Distance (Feet): 250 Feet Assistive device: None Gait Pattern/deviations: WFL(Within Functional Limits) Gait velocity: slightly decreased   General Gait Details: Pt on room air with O2 sat 92-95%, moderate shortness of breath, 1 standing rest break, no loss of balance with ascending/descending sloped surfaces  Stairs             Wheelchair Mobility    Modified Rankin (Stroke Patients Only)       Balance Overall balance assessment: No apparent balance deficits (not formally assessed)                                           Pertinent Vitals/Pain Pain Assessment: No/denies pain    Home Living Family/patient expects to be discharged to:: Private residence Living Arrangements: Alone Available Help at Discharge: Family(Daughter in law lives next door) Type of Home: Mobile home Home Access: Stairs to enter Entrance Stairs-Rails: Right;Left;Can reach both Technical brewer of Steps: 4 Home Layout: One level Home Equipment: None      Prior Function Level of Independence: Independent         Comments: Pt reports Ind with ADLs, drives, ambulates community distances without AD     Hand Dominance        Extremity/Trunk Assessment   Upper Extremity Assessment Upper Extremity Assessment: Overall WFL for tasks assessed    Lower Extremity Assessment Lower Extremity Assessment: Overall WFL for tasks assessed    Cervical / Trunk Assessment Cervical / Trunk Assessment: Normal  Communication   Communication: No difficulties  Cognition Arousal/Alertness: Awake/alert Behavior During Therapy: WFL for tasks assessed/performed Overall Cognitive Status: Within Functional Limits for tasks assessed  General Comments      Exercises     Assessment/Plan    PT Assessment Patent does not need any further PT services  PT Problem List         PT Treatment Interventions      PT Goals (Current goals can be found in the Care Plan section)  Acute Rehab PT Goals Patient Stated Goal: return home PT Goal Formulation: With patient Time For Goal Achievement: 03/27/18 Potential to Achieve Goals: Good    Frequency     Barriers to discharge        Co-evaluation               AM-PAC PT "6 Clicks" Mobility   Outcome Measure Help needed turning from your back to your side while in a flat bed without using bedrails?: None Help needed moving from lying on your back to sitting on the side of a flat bed without using bedrails?: None Help needed moving to and from a bed to a chair (including a wheelchair)?: None Help needed standing up from a chair using your arms (e.g., wheelchair or bedside chair)?: None Help needed to walk in hospital room?: None Help needed climbing 3-5 steps with a railing? : None 6 Click Score: 24    End of Session Equipment Utilized During Treatment: Gait belt Activity Tolerance: Patient tolerated treatment well Patient left: in bed;with call bell/phone within reach;with nursing/sitter in room Nurse Communication: Mobility status PT Visit Diagnosis: Other abnormalities of gait and mobility (R26.89)    Time: 0211-1552 PT Time Calculation (min) (ACUTE ONLY): 13 min   Charges:   PT Evaluation $PT Eval Low Complexity: 1 Low PT Treatments $Gait Training: 8-22 mins        10:20 AM, 03/27/18 Talbot Grumbling, DPT Physical Therapist with [facility] [phone] office

## 2018-03-27 NOTE — Progress Notes (Signed)
OT Screen Note  Patient Details Name: Vincent Carter MRN: [id number] DOB: 1945/07/17   Cancelled Treatment:    Reason Eval/Treat Not Completed: OT screened, no needs identified, will sign off.  Patient presenting on baseline level of modified independence to independent for ADL tasks. Patient lives alone with no DME. 5/5 BUE strength assessed. Patient voices no concerns with returning home. No additional OT services needed at discharge. Thank-you.   Ailene Ravel, OTR/L,CBIS  [phone]  03/27/2018, 10:29 AM

## 2018-03-27 NOTE — Care Management Important Message (Signed)
Important Message  Patient Details  Name: Vincent Carter MRN: [id number] Date of Birth: 1945-05-28   Medicare Important Message Given:  Yes    Tommy Medal 03/27/2018, 3:36 PM

## 2018-03-27 NOTE — Progress Notes (Addendum)
Progress Note  Patient Name: Vincent Carter Date of Encounter: 03/27/2018  Primary Cardiologist: Carlyle Dolly, MD    Subjective   Breathing a little better. Doesn't feel heart racing  Inpatient Medications    Scheduled Meds: . apixaban  5 mg Oral BID  . aspirin EC  81 mg Oral BID  . budesonide (PULMICORT) nebulizer solution  0.25 mg Nebulization BID  . carvedilol  12.5 mg Oral BID  . dextromethorphan-guaiFENesin  1 tablet Oral BID  . furosemide  40 mg Intravenous BID  . ipratropium-albuterol  3 mL Nebulization Q4H  . lisinopril  2.5 mg Oral Daily  . potassium chloride  40 mEq Oral BID  . predniSONE  40 mg Oral Q breakfast  . sodium chloride flush  3 mL Intravenous Q12H   Continuous Infusions: . sodium chloride     PRN Meds: sodium chloride, acetaminophen, albuterol, diltiazem, guaiFENesin-dextromethorphan, ondansetron (ZOFRAN) IV, sodium chloride flush   Vital Signs    Vitals:   03/26/18 2339 03/27/18 0406 03/27/18 0511 03/27/18 0721  BP:   112/80   Pulse:   91   Resp:      Temp:   98.4 F (36.9 C)   TempSrc:   Oral   SpO2: 96% 96% 93% 97%  Weight:      Height:        Intake/Output Summary (Last 24 hours) at 03/27/2018 0801 Last data filed at 03/27/2018 0600 Gross per 24 hour  Intake 960 ml  Output 2450 ml  Net -1490 ml   Last 3 Weights 03/26/2018 03/25/2018 03/24/2018  Weight (lbs) 157 lb 13.6 oz 162 lb 4.1 oz 160 lb 15 oz  Weight (kg) 71.6 kg 73.6 kg 73 kg      Telemetry    Afib at 120/m with 8-12 beats WCT 03/26/18 - Personally Reviewed  ECG    Aflutter at 96/m 03/24/18 - Personally Reviewed  Physical Exam    GEN: No acute distress.   Neck: No JVD Cardiac: irreg at 120/m, no murmurs, rubs, or gallops.  Respiratory: Decreased breath sounds with diffuse wheezing, rhonchi at right lung base and throughout and rales at bases GI: Soft, nontender, non-distended  MS: trace edema; No deformity. Neuro:  Nonfocal  Psych: Normal affect   Labs     Chemistry Recent Labs  Lab 03/24/18 1308 03/25/18 0626 03/26/18 0626 03/27/18 0551  NA  --  139 141 139  K  --  3.7 4.8 3.7  CL  --  105 105 101  CO2  --  25 28 29   GLUCOSE  --  117* 95 66*  BUN  --  21 24* 34*  CREATININE  --  1.02 1.25* 1.47*  CALCIUM  --  8.7* 9.2 8.7*  PROT 6.1* 6.4*  --   --   ALBUMIN 3.7 3.7  --   --   AST 18 15  --   --   ALT 20 20  --   --   ALKPHOS 75 78  --   --   BILITOT 1.8* 1.4*  --   --   GFRNONAA  --  >60 56* 46*  GFRAA  --  >60 >60 54*  ANIONGAP  --  9 8 9      Hematology Recent Labs  Lab 03/24/18 1235  WBC 8.5  RBC 5.82*  HGB 17.0  HCT 53.3*  MCV 91.6  MCH 29.2  MCHC 31.9  RDW 16.0*  PLT 152    Cardiac Enzymes Recent Labs  Lab 03/24/18 1235  TROPONINI <0.03   No results for input(s): TROPIPOC in the last 168 hours.   BNP Recent Labs  Lab 03/24/18 1235  BNP 545.0*     DDimer No results for input(s): DDIMER in the last 168 hours.   Radiology    No results found.  Cardiac Studies   Echo 03/24/18 IMPRESSIONS      1. The left ventricle has mild-moderately reduced systolic function, with an ejection fraction of 40-45%. The cavity size was normal. There is mild concentric left ventricular hypertrophy. Left ventricular diastolic function could not be evaluated  secondary to atrial fibrillation. Indeterminate filling pressures Left ventricular diffuse hypokinesis.  2. The mitral valve is grossly normal. There is mild mitral annular calcification present.  3. The tricuspid valve is grossly normal.  4. The aortic valve is tricuspid Mild thickening of the aortic valve Sclerosis without any evidence of stenosis of the aortic valve. Aortic valve regurgitation is trivial by color flow Doppler.  5. The aortic root is normal in size and structure.     Patient Profile     73 y.o. male with history of CAD redo CABG 2006, ICM, HTN, admitted with COPD exacerbation and CHF.  Assessment & Plan     1.  Acute on chronic  systolic heart failure: LVEF 40 to 45% by echocardiogram down slightly from 02/2013. Has diuresed 5.7 L so far 1490 overnight.  Crt up to 1.47. Will decrease Lasix to 40 mg once daily.  2.  Coronary artery disease:  History of CABG in 1997 and redo in 2006.   No angina.Continue carvedilol.  No aspirin since starting Eliquis.  He is reportedly statin intolerant and takes fish oil.   3.  Hypertension: Blood pressure is much better on carvedilol and lisinopril but Crt climbing.      4.  Hyperlipidemia:LDL 48 He is reportedly statin intolerant and takes fish oil.    5.  New onset atrial fibrillation:On  carvedilol for rate control and Eliquis for  CHADSVASC score is 4. Rate still 120. Increase carvedilol to 18.75 mg BID.  6. WCT 8-12 beat runs with normal K and Mg. Continue to monitor.    7. COPD with acute exacerbation       For questions or updates, please contact [facility] Please consult [email or web address] for contact info under        Signed, Ermalinda Barrios, PA-C  03/27/2018, 8:01 AM    Pt seen and examined   I agree with findings of [name] noted above  Pt still says breathing is not at baseline  Neck:  JVP normal Lungs  Diffuse wheeze and rhonchi   Rales at bases Cardiac exa:   Irreg irreg  No S3 Ext with trace edema   Would continue diuresing   Watch renal function Increase coreg for better rate control Will have dietary see pt   He admits to eating sausage often     Needs low Na diet   2Gram   1500 cc fluid  Dorris Carnes

## 2018-03-28 DIAGNOSIS — I5043 Acute on chronic combined systolic (congestive) and diastolic (congestive) heart failure: Secondary | ICD-10-CM

## 2018-03-28 DIAGNOSIS — I10 Essential (primary) hypertension: Secondary | ICD-10-CM

## 2018-03-28 DIAGNOSIS — J441 Chronic obstructive pulmonary disease with (acute) exacerbation: Secondary | ICD-10-CM

## 2018-03-28 LAB — BASIC METABOLIC PANEL
Anion gap: 7 (ref 5–15)
BUN: 35 mg/dL — ABNORMAL HIGH (ref 8–23)
CO2: 26 mmol/L (ref 22–32)
Calcium: 8.8 mg/dL — ABNORMAL LOW (ref 8.9–10.3)
Chloride: 102 mmol/L (ref 98–111)
Creatinine, Ser: 1.2 mg/dL (ref 0.61–1.24)
GFR calc Af Amer: >60 mL/min (ref >60)
GFR calc non Af Amer: 59 mL/min — ABNORMAL LOW (ref >60)
Glucose, Bld: 95 mg/dL (ref 70–99)
Potassium: 4.8 mmol/L (ref 3.5–5.1)
Sodium: 135 mmol/L (ref 135–145)

## 2018-03-28 NOTE — Progress Notes (Signed)
PROGRESS NOTE                                                                                                                                                                                                             Patient Demographics:    Vincent Carter, is a 73 y.o. male, DOB - Dec 08, 1945, MAU:[id number]  Admit date - 03/24/2018   Admitting Physician Truett Mainland, DO  Outpatient Primary MD for the patient is [name], MD  LOS - 3  Outpatient Specialists: None  Chief Complaint  Patient presents with  . Shortness of Breath       Brief Narrative 73 year old male with history of CAD s/p CABG 97 and revision in 5638, systolic CHF, hypertension, COPD, peripheral vascular disease who presented to the ED with several days of lower extremity swelling with increasing shortness of breath worsened on exertion.  Patient found to be in acute on chronic systolic CHF, COPD with acute exacerbation and new onset A. fib.  Patient admitted to medical floor on telemetry and cardiology consulted.   Subjective:   Patient is feeling better. Still has some shortness of breath on ambulation  Assessment  & Plan :    Principal Problem:   Acute on chronic systolic CHF (congestive heart failure) ([place]) He is currently diuresing with IV lasix.  Negative balance of --6.9 L since admission.   Repeat 2D echo shows EF of 40-45% with LVH. Continue aspirin, BB, statin and lisinopril.  LDL of 48.  Continue potassium supplement. Strict I's/O and daily weight. Cardiology consult appreciated.    Active Problems: Acute respiratory failure with hypoxia (HCC) COPD with acute exacerbation. Maintaining sats on 2 L, wheezing better this morning.  Continue DuoNeb every 4 hour, and Pulmicort neb.  Continue Mucinex twice daily.  Does not require antibiotic.  New onset A. fib with RVR (HCC) Heart rate is better with increased dose of cardizem.   Placed on IV Cardizem as needed.  If not controlled we will place him on scheduled Cardizem as well. CHADS2VASC of 4.  Started on Eliquis by cardiology. Patient has history of hypothyroidism (not on any medication currently).  TSH normal.  2D echo reviewed.  Acute kidney injury Mild secondary to diuresis.  Creatinine at 1.20 elevated from baseline of 0.9.  Lasix dose has been decreased. Creatinine is currently stable.  Continue to monitor.  Tobacco abuse Counseled strongly on smoking cessation.  Continue nicotine patch.    code Status : Full code  Family Communication  : None at bedside  Disposition Plan  : Home pending improvement in his respiratory symptoms, CHF and rapid A. fib.  Barriers For Discharge : Active symptoms  Consults  : Cardiology  Procedures  : 2D echo  DVT Prophylaxis  : Eliquis  Lab Results  Component Value Date   PLT 152 03/24/2018    Antibiotics  :   Anti-infectives (From admission, onward)   None        Objective:   Vitals:   03/28/18 0841 03/28/18 1423 03/28/18 1615 03/28/18 1952  BP:   (!) 109/92   Pulse:   (!) 52 78  Resp:   (!) 23 20  Temp:   98.1 F (36.7 C)   TempSrc:   Oral   SpO2: 93% 93% (!) 89% 94%  Weight:      Height:        Wt Readings from Last 3 Encounters:  03/28/18 71.7 kg  10/01/16 69.9 kg  09/16/15 70.3 kg     Intake/Output Summary (Last 24 hours) at 03/28/2018 2002 Last data filed at 03/28/2018 1837 Gross per 24 hour  Intake 240 ml  Output 1700 ml  Net -1460 ml    Physical exam General exam: Alert, awake, oriented x 3 Respiratory system:scattered rhonchi. Respiratory effort normal. Cardiovascular system:RRR. No murmurs, rubs, gallops. Gastrointestinal system: Abdomen is nondistended, soft and nontender. No organomegaly or masses felt. Normal bowel sounds heard. Central nervous system: Alert and oriented. No focal neurological deficits. Extremities: 1+ edema Skin: No rashes, lesions or ulcers Psychiatry:  Judgement and insight appear normal. Mood & affect appropriate.      Data Review:    CBC Recent Labs  Lab 03/24/18 1235  WBC 8.5  HGB 17.0  HCT 53.3*  PLT 152  MCV 91.6  MCH 29.2  MCHC 31.9  RDW 16.0*  LYMPHSABS 1.8  MONOABS 1.0  EOSABS 0.3  BASOSABS 0.1    Chemistries  Recent Labs  Lab 03/24/18 1235 03/24/18 1308 03/25/18 0626 03/26/18 0626 03/27/18 0551 03/28/18 0515  NA 143  --  139 141 139 135  K 3.7  --  3.7 4.8 3.7 4.8  CL 109  --  105 105 101 102  CO2 26  --  25 28 29 26   GLUCOSE 109*  --  117* 95 66* 95  BUN 24*  --  21 24* 34* 35*  CREATININE 1.12  --  1.02 1.25* 1.47* 1.20  CALCIUM 9.0  --  8.7* 9.2 8.7* 8.8*  MG  --   --   --  2.2  --   --   AST  --  18 15  --   --   --   ALT  --  20 20  --   --   --   ALKPHOS  --  75 78  --   --   --   BILITOT  --  1.8* 1.4*  --   --   --    ------------------------------------------------------------------------------------------------------------------ No results for input(s): CHOL, HDL, LDLCALC, TRIG, CHOLHDL, LDLDIRECT in the last 72 hours.  No results found for: HGBA1C ------------------------------------------------------------------------------------------------------------------ No results for input(s): TSH, T4TOTAL, T3FREE, THYROIDAB in the last 72 hours.  Invalid input(s): FREET3 ------------------------------------------------------------------------------------------------------------------ No results for input(s): VITAMINB12, FOLATE, FERRITIN, TIBC, IRON, RETICCTPCT in the last 72 hours.  Coagulation profile No results  for input(s): INR, PROTIME in the last 168 hours.  No results for input(s): DDIMER in the last 72 hours.  Cardiac Enzymes Recent Labs  Lab 03/24/18 1235  TROPONINI <0.03   ------------------------------------------------------------------------------------------------------------------    Component Value Date/Time   BNP 545.0 (H) 03/24/2018 1235    Inpatient  Medications  Scheduled Meds: . apixaban  5 mg Oral BID  . budesonide (PULMICORT) nebulizer solution  0.25 mg Nebulization BID  . carvedilol  18.75 mg Oral BID WC  . dextromethorphan-guaiFENesin  1 tablet Oral BID  . furosemide  40 mg Intravenous Daily  . ipratropium-albuterol  3 mL Nebulization TID  . lisinopril  2.5 mg Oral Daily  . potassium chloride  40 mEq Oral BID  . sodium chloride flush  3 mL Intravenous Q12H   Continuous Infusions: . sodium chloride     PRN Meds:.sodium chloride, acetaminophen, albuterol, diltiazem, guaiFENesin-dextromethorphan, ondansetron (ZOFRAN) IV, sodium chloride flush  Micro Results No results found for this or any previous visit (from the past 240 hour(s)).  Radiology Reports Dg Chest 2 View  Result Date: 03/24/2018 CLINICAL DATA:  Shortness of breath. EXAM: CHEST - 2 VIEW COMPARISON:  Radiographs of October 15, 2016. FINDINGS: Stable cardiomediastinal silhouette. Status post coronary bypass graft. No pneumothorax is noted. Mild lingular subsegmental atelectasis is noted. Minimal right pleural effusion is noted with associated subsegmental atelectasis. Bony thorax is unremarkable. IMPRESSION: Minimal right pleural effusion with associated right basilar subsegmental atelectasis. Mild lingular subsegmental atelectasis. Electronically Signed   By: Marijo Conception, M.D.   On: 03/24/2018 12:23    Time Spent in minutes  35   Kathie Dike M.D on 03/28/2018 at 8:02 PM  Between 7am to 7pm   After 7pm go to [email or web address]   [facility] -  Office  [phone]

## 2018-03-28 NOTE — Plan of Care (Signed)
Nutrition Education Note  RD consulted for nutrition education regarding CHF and concerns for excess sodium intake.  Pt alone on RD arrival. He has SOB that limits his ability to say more then a couple sentences at a time.   Diet Recall: Breakfast: Scrambled egg sandwich from "[employer]". After asking specifically about sausage, does say he eats "one piece, and not every day" Lunch: Typically makes a sandwich: Chix salad (from [place], pimento cheese, or ham) Dinner: Just "vegetables". The last dinner PTA was mashed potatoes, corn, green beans, roll, and tea Beverages: Water, tea, sundrop soda.  Behaviors: Does not read labels, most of his meals come from restaurants. Denies any salt shaker use. Eats canned vegetables-doesn't rinse them. Does not weigh self daily High salt items: Denies eating hotdogs or condensed soup >1x/month (does eat brunswick stew). Eats processed meats atleast 1x a week (beanie weenies, vienna sausage). Does not eat spam. Eats deli meat (1 pack a month).   One of the biggest issues in pts diet is his habit of eating out. He shares that he is widowed and lives alone. He does not cook since he is only one person. RD shared the dangers of eating out: added sodium/sugar, excessive portion sizes and lack of menu transparency. The latter of which is extremely pertinent in small, locally owned restaurants. RD recommended eating out no more than 2-3x a week.   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 He says he cant read labels, though he can drive. He says he can see the large labels on front of packaging. RD recommended choosing items reading low sodium. One easy way patient can decrease his sodium intake is by rinsing his vegetables, which he currently does not do.   His fluid intake does not sound to be excessive. When asked about liquids specifically, he says he drinks a 16.9 oz bottle of sundrop and 16 oz bottle of water daily (however other bevs were  listed in his diet recall).   RD explained importance of self weighing and how this can be used as feedback on his diet. If he is up 3 lbs in a day, he should look back at what he ate the day before, as he may have over-consumed salt or fluid.   RD provided handout titled "Heart Failure Nutrition Therapy" and hand written recommendations. RD tried to keep it simple for this patient and asked him to focus on eating fresh frozen items (rinse items if canned), cutting back on his processed meats and preparing more meals at home.   Expect poor-Fair compliance. Pt is a poor historian. His diet recall continually evolved throughout our conversation. There are many poor diet habits that took several questions to uncover- as such, suspect there are several other poor diet habits. He also downplays a lot of his eating habits and food choices.    If additional nutrition issues arise, please re-consult RD.   Burtis Junes RD, LDN, CNSC Clinical Nutrition Available Tues-Sat via Pager: [phone] 03/28/2018 4:41 PM

## 2018-03-28 NOTE — Progress Notes (Addendum)
Progress Note  Patient Name: Vincent Carter Date of Encounter: 03/28/2018  Primary Cardiologist: Carlyle Dolly, MD   Subjective   Says his breathing continues to improve. Ambulated down the hallway yesterday and says he still had shortness of breath but was much improved as compared to the time of admission. No recent chest pain or palpitations.   Inpatient Medications    Scheduled Meds: . apixaban  5 mg Oral BID  . aspirin EC  81 mg Oral BID  . budesonide (PULMICORT) nebulizer solution  0.25 mg Nebulization BID  . carvedilol  18.75 mg Oral BID WC  . dextromethorphan-guaiFENesin  1 tablet Oral BID  . furosemide  40 mg Intravenous Daily  . ipratropium-albuterol  3 mL Nebulization TID  . lisinopril  2.5 mg Oral Daily  . potassium chloride  40 mEq Oral BID  . sodium chloride flush  3 mL Intravenous Q12H   Continuous Infusions: . sodium chloride     PRN Meds: sodium chloride, acetaminophen, albuterol, diltiazem, guaiFENesin-dextromethorphan, ondansetron (ZOFRAN) IV, sodium chloride flush   Vital Signs    Vitals:   03/28/18 0500 03/28/18 0540 03/28/18 0836 03/28/18 0841  BP:  (!) 148/85    Pulse:  70    Resp:  19    Temp:  98.3 F (36.8 C)    TempSrc:  Oral    SpO2:  93% 93% 93%  Weight: 71.7 kg     Height:        Intake/Output Summary (Last 24 hours) at 03/28/2018 1010 Last data filed at 03/28/2018 0900 Gross per 24 hour  Intake 240 ml  Output 1000 ml  Net -760 ml    Last 3 Weights 03/28/2018 03/27/2018 03/26/2018  Weight (lbs) 158 lb 1.1 oz 154 lb 14.4 oz 157 lb 13.6 oz  Weight (kg) 71.7 kg 70.262 kg 71.6 kg      Telemetry    Atrial fibrillation, HR in 70's to 90's. - Personally Reviewed  ECG    No new tracings.   Physical Exam   General: Well developed, well nourished Caucasian male appearing in no acute distress. Head: Normocephalic, atraumatic.  Neck: Supple without bruits, JVD not elevated. Lungs:  Resp regular and unlabored, expiratory  wheezing along upper lung fields with rhonchi present. Heart: Irregularly irregular, S1, S2, no S3, S4, or murmur; no rub. Abdomen: Soft, non-tender, non-distended with normoactive bowel sounds. No hepatomegaly. No rebound/guarding. No obvious abdominal masses Extremities: No clubbing or cyanosis, trace lower extremity edema bilaterally. Distal pedal pulses are 2+ bilaterally. Neuro: Alert and oriented X 3. Moves all extremities spontaneously. Psych: Normal affect.  Labs    Chemistry Recent Labs  Lab 03/24/18 1308 03/25/18 0626 03/26/18 0626 03/27/18 0551 03/28/18 0515  NA  --  139 141 139 135  K  --  3.7 4.8 3.7 4.8  CL  --  105 105 101 102  CO2  --  25 28 29 26   GLUCOSE  --  117* 95 66* 95  BUN  --  21 24* 34* 35*  CREATININE  --  1.02 1.25* 1.47* 1.20  CALCIUM  --  8.7* 9.2 8.7* 8.8*  PROT 6.1* 6.4*  --   --   --   ALBUMIN 3.7 3.7  --   --   --   AST 18 15  --   --   --   ALT 20 20  --   --   --   ALKPHOS 75 78  --   --   --  BILITOT 1.8* 1.4*  --   --   --   GFRNONAA  --  >60 56* 46* 59*  GFRAA  --  >60 >60 54* >60  ANIONGAP  --  9 8 9 7      Hematology Recent Labs  Lab 03/24/18 1235  WBC 8.5  RBC 5.82*  HGB 17.0  HCT 53.3*  MCV 91.6  MCH 29.2  MCHC 31.9  RDW 16.0*  PLT 152    Cardiac Enzymes Recent Labs  Lab 03/24/18 1235  TROPONINI <0.03   No results for input(s): TROPIPOC in the last 168 hours.   BNP Recent Labs  Lab 03/24/18 1235  BNP 545.0*     DDimer No results for input(s): DDIMER in the last 168 hours.   Radiology    No results found.  Cardiac Studies   Echocardiogram: 03/24/2018 IMPRESSIONS    1. The left ventricle has mild-moderately reduced systolic function, with an ejection fraction of 40-45%. The cavity size was normal. There is mild concentric left ventricular hypertrophy. Left ventricular diastolic function could not be evaluated  secondary to atrial fibrillation. Indeterminate filling pressures Left ventricular  diffuse hypokinesis.  2. The mitral valve is grossly normal. There is mild mitral annular calcification present.  3. The tricuspid valve is grossly normal.  4. The aortic valve is tricuspid Mild thickening of the aortic valve Sclerosis without any evidence of stenosis of the aortic valve. Aortic valve regurgitation is trivial by color flow Doppler.  5. The aortic root is normal in size and structure.   Patient Profile     73 y.o. male w/ PMH of CAD (s/p CABG in 1997 with re-do CABG in 2006), ischemic cardiomyopathy (EF 45-50% by echo in 2015, at 40-45% this admission), HTN, and HLD who presented on 03/24/2018 for evaluation of worsening dyspnea and lower extremity edema. Found to have new-onset atrial fibrillation. Admitted for further management of his CHF, arrhythmia, and COPD.   Assessment & Plan    1. Acute on Chronic Combined Systolic and Diastolic CHF - BNP elevated to 545 on admission and CXR consistent with CHF. Repeat echo shows EF is reduced to 40-45%.  - was initially receiving IV Lasix 40mg  BID and this was reduced to daily dosing on 3/16 due to up-trending creatinine.  Was elevated to 1.47 yesterday, improved to 1.20 today.  - he is overall -6.7L this admission and weight has declined from 168 lbs on admission to 158 lbs today. Volume status significantly improved by examination. Anticipate switching to PO diuretics within the next 24 hours. Was not on diuretic therapy PTA. Could likely start Lasix 40mg  daily and adjust dosing if needed as an outpatient.  - remains on Coreg 18.75mg  BID and Lisinopril 2.5mg  daily.   2. CAD - he is s/p CABG in 1997 with re-do CABG in 2006. Denies any recent chest pain.  - he has been continued on PTA ASA and Coreg. Not on statin therapy given prior intolerance (LDL at 48 this admission). Will review continuing ASA with Dr. Bronson Ing given the new indication for anticoagulation.    3. New-Onset Atrial Fibrillation - he has been started on Eliquis  5mg  BID for anticoagulation. Hgb stable at 17.0 on admission. Will consult case management for benefits check.  - rates have been well-controlled on Coreg 18.75 mg BID. Could titrate to 25mg  BID if needed in the future. Would avoid Cardizem with his reduced EF.    4. HTN - BP has been stable at 100/72 - 148/85  within the past 24 hours.  - continue Coreg 18.75 mg BID and Lisinopril 2.5mg  daily.  5. COPD Exacerbation - still with wheezing on examination. Further management per admitting team.    For questions or updates, please contact [facility] Please consult [email or web address] for contact info under Cardiology/STEMI.   Signed, Erma Heritage , PA-C 10:10 AM 03/28/2018 Pager: [phone]  The patient was seen and examined, and I agree with the history, physical exam, assessment and plan as documented above.  He is feeling much better but not quite back to baseline.  Heart rates under much better control on higher doses of Coreg.  Anticoagulated with Eliquis.  I will stop aspirin.  He will receive IV Lasix today and transition to oral Lasix 40 mg daily tomorrow.  Renal function has improved.  Kate Sable, MD, [facility]  03/28/2018 12:40 PM

## 2018-03-29 DIAGNOSIS — I472 Ventricular tachycardia: Secondary | ICD-10-CM

## 2018-03-29 LAB — BASIC METABOLIC PANEL
Anion gap: 8 (ref 5–15)
BUN: 44 mg/dL — ABNORMAL HIGH (ref 8–23)
CHLORIDE: 104 mmol/L (ref 98–111)
CO2: 23 mmol/L (ref 22–32)
CREATININE: 1.24 mg/dL (ref 0.61–1.24)
Calcium: 8.9 mg/dL (ref 8.9–10.3)
GFR calc Af Amer: >60 mL/min (ref >60)
GFR calc non Af Amer: 57 mL/min — ABNORMAL LOW (ref >60)
Glucose, Bld: 101 mg/dL — ABNORMAL HIGH (ref 70–99)
Potassium: 5.7 mmol/L — ABNORMAL HIGH (ref 3.5–5.1)
Sodium: 135 mmol/L (ref 135–145)

## 2018-03-29 LAB — MAGNESIUM: Magnesium: 2.4 mg/dL (ref 1.7–2.4)

## 2018-03-29 MED ORDER — METHYLPREDNISOLONE SODIUM SUCC 125 MG IJ SOLR
60.0000 mg | Freq: Two times a day (BID) | INTRAMUSCULAR | Status: DC
  Administered 2018-03-29 – 2018-03-30 (×2): 60 mg via INTRAVENOUS
  Filled 2018-03-29 (×2): qty 2

## 2018-03-29 MED ORDER — FUROSEMIDE 40 MG PO TABS
40.0000 mg | ORAL_TABLET | Freq: Every day | ORAL | Status: DC
  Administered 2018-03-29 – 2018-03-30 (×2): 40 mg via ORAL
  Filled 2018-03-29 (×2): qty 1

## 2018-03-29 MED ORDER — POTASSIUM CHLORIDE CRYS ER 20 MEQ PO TBCR
20.0000 meq | EXTENDED_RELEASE_TABLET | Freq: Every day | ORAL | Status: DC
  Filled 2018-03-29: qty 1

## 2018-03-29 MED ORDER — AMIODARONE HCL 200 MG PO TABS
200.0000 mg | ORAL_TABLET | Freq: Every day | ORAL | Status: DC
  Administered 2018-03-29 – 2018-03-30 (×2): 200 mg via ORAL
  Filled 2018-03-29 (×2): qty 1

## 2018-03-29 NOTE — TOC Initial Note (Signed)
Transition of Care (TOC) - Initial/Assessment Note    Patient Details  Name: Vincent Carter MRN: [id number] Date of Birth: 10/10/1945  Transition of Care (TOC) CM/SW Contact:    Sophire M Nelson, LCSW Phone Number: 03/29/2018, 10:39 AM  Clinical Narrative:  CSW met with Pt to discuss discharge needs. Pt expressed that he lived independent with minimal assistance from his daughter. Pt states that he is mobile and usually drives to his doctor appointments accompanied by his daughter in law Lisa Soto. Pt also states that he has no DME needs at this time. Pt does not take daily weights in a home setting and does not have a scale to do so. CSW ordered [facility] consult to assist Pt in the management of CHF post discharge. CSW provided Pt with 30 day card for Eliquis. Assigned CSW will continue to follow Pt to assist with discharge needs.   Sophire M. Nelson LCSWA Clinical Social Worker                  Expected Discharge Plan: Home/Self Care(Pt to return home with support of family) Barriers to Discharge: No Barriers Identified   Patient Goals and CMS Choice Patient states their goals for this hospitalization and ongoing recovery are:: (Pt plans to return home under the care of his PCP and support of his family memnbers )   Choice offered to / list presented to : NA  Expected Discharge Plan and Services Expected Discharge Plan: Home/Self Care(Pt to return home with support of family) Discharge Planning Services: (S) Other - See comment([facility] consult ordered for CHF managment ) Post Acute Care Choice: Home Health Living arrangements for the past 2 months: Single Family Home                 DME Arranged: N/A DME Agency: NA      Prior Living Arrangements/Services Living arrangements for the past 2 months: Single Family Home Lives with:: Self Patient language and need for interpreter reviewed:: No Do you feel safe going back to the place where you live?: Yes          Current home  services: (none at this time)    Activities of Daily Living Home Assistive Devices/Equipment: None ADL Screening (condition at time of admission) Patient's cognitive ability adequate to safely complete daily activities?: Yes Is the patient deaf or have difficulty hearing?: Yes Does the patient have difficulty seeing, even when wearing glasses/contacts?: No Does the patient have difficulty concentrating, remembering, or making decisions?: No Patient able to express need for assistance with ADLs?: Yes Does the patient have difficulty dressing or bathing?: No Independently performs ADLs?: Yes (appropriate for developmental age) Does the patient have difficulty walking or climbing stairs?: No Weakness of Legs: None Weakness of Arms/Hands: None  Permission Sought/Granted Permission sought to share information with : PCP Permission granted to share information with : Yes, Verbal Permission Granted              Emotional Assessment Appearance:: Appears stated age Attitude/Demeanor/Rapport: Engaged Affect (typically observed): Accepting, Calm Orientation: : Oriented to Self, Oriented to  Time, Oriented to Place, Oriented to Situation      Admission diagnosis:  Systolic congestive heart failure, unspecified HF chronicity (HCC) [I50.20] Patient Active Problem List   Diagnosis Date Noted  . New onset atrial fibrillation (HCC) 03/25/2018  . Atrial fibrillation with RVR (HCC) 03/25/2018  . Acute on chronic systolic CHF (congestive heart failure) (HCC) 03/24/2018  . COPD with acute exacerbation (  [place]) 03/24/2018  . Malignant neoplasm of prostate ([place]) 03/05/2014  . Acute chest pain 09/08/2013  . Vertigo 09/08/2013  . Bigeminal rhythm 09/08/2013  . ASCVD (arteriosclerotic cardiovascular disease)   . Tobacco abuse, in remission   . Low TSH level   . Peripheral vascular disease ([place])   . Colonic polyp 12/29/2009  . Hypertension 12/09/2008  . Chronic obstructive pulmonary disease ([place])  12/09/2008   PCP:  [name], MD Pharmacy:   [facility], [address] [place] [address] Phone: [phone] Fax: [phone]     Social Determinants of Health (SDOH) Interventions    Readmission Risk Interventions 30 Day Unplanned Readmission Risk Score     ED to Hosp-Admission (Current) from 03/24/2018 in [place]  30 Day Unplanned Readmission Risk Score (%)  14 Filed at 03/29/2018 0801     This score is the patient's risk of an unplanned readmission within 30 days of being discharged (0 -100%). The score is based on dignosis, age, lab data, medications, orders, and past utilization.   Low:  0-14.9   Medium: 15-21.9   High: 22-29.9   Extreme: 30 and above       No flowsheet data found.

## 2018-03-29 NOTE — Progress Notes (Signed)
PROGRESS NOTE                                                                                                                                                                                                             Patient Demographics:    Vincent Carter, is a 73 y.o. male, DOB - 23-Oct-1945, ZOX:[id number]  Admit date - 03/24/2018   Admitting Physician Truett Mainland, DO  Outpatient Primary MD for the patient is [name], MD  LOS - 4  Outpatient Specialists: None  Chief Complaint  Patient presents with   Shortness of Breath       Brief Narrative 73 year old male with history of CAD s/p CABG 97 and revision in 8119, systolic CHF, hypertension, COPD, peripheral vascular disease who presented to the ED with several days of lower extremity swelling with increasing shortness of breath worsened on exertion.  Patient found to be in acute on chronic systolic CHF, COPD with acute exacerbation and new onset A. fib.  Patient admitted to medical floor on telemetry and cardiology consulted.   Subjective:   Feels more short of breath today.  He is wheezing more today.  Assessment  & Plan :    Principal Problem:   Acute on chronic systolic CHF (congestive heart failure) ([place]) He has been adequately diuresed with IV Lasix.  Negative balance of -8.5 L since admission.  He has since been transitioned to oral Lasix   Repeat 2D echo shows EF of 40-45% with LVH. Continue aspirin, BB, statin and lisinopril.  LDL of 48.  Continue potassium supplement. Strict I's/O and daily weight. Cardiology consult appreciated.    Active Problems: Acute respiratory failure with hypoxia (HCC) COPD with acute exacerbation. Patient appears to be more short of breath and has some wheezing today.  Continue on bronchodilators.  Will start on IV steroids.  New onset A. fib with RVR (HCC) Heart rate is better with increased dose of cardizem.   Placed on IV Cardizem as needed.  If not controlled we will place him on scheduled Cardizem as well. CHADS2VASC of 4.  Started on Eliquis by cardiology. Patient has history of hypothyroidism (not on any medication currently).  TSH normal.  2D echo reviewed.  Patient did have some episodes of nonsustained VT overnight.  After discussion with cardiology, he has been started on a two-week  course of amiodarone  Acute kidney injury Mild secondary to diuresis.  Creatinine at 1.20 elevated from baseline of 0.9.  Lasix dose has been decreased. Creatinine is currently stable. Continue to monitor.  Tobacco abuse Counseled strongly on smoking cessation.  Continue nicotine patch.    code Status : Full code  Family Communication  : None at bedside  Disposition Plan  : Home pending improvement in his respiratory symptoms, CHF and rapid A. fib.  Barriers For Discharge : Active symptoms  Consults  : Cardiology  Procedures  : 2D echo  DVT Prophylaxis  : Eliquis  Lab Results  Component Value Date   PLT 152 03/24/2018    Antibiotics  :   Anti-infectives (From admission, onward)   None        Objective:   Vitals:   03/29/18 0500 03/29/18 0732 03/29/18 1347 03/29/18 1430  BP: 103/77  91/68   Pulse: 66     Resp: (!) 22  20   Temp: 97.7 F (36.5 C)  97.9 F (36.6 C)   TempSrc: Oral  Oral   SpO2: 95% 94%  91%  Weight:      Height:        Wt Readings from Last 3 Encounters:  03/29/18 69.1 kg  10/01/16 69.9 kg  09/16/15 70.3 kg     Intake/Output Summary (Last 24 hours) at 03/29/2018 1916 Last data filed at 03/29/2018 1300 Gross per 24 hour  Intake 480 ml  Output 900 ml  Net -420 ml    Physical exam General exam: Alert, awake, oriented x 3 Respiratory system:mild wheeze and scattered rhonchi. Increased respiratory effort Cardiovascular system:RRR. No murmurs, rubs, gallops. Gastrointestinal system: Abdomen is nondistended, soft and nontender. No organomegaly or masses felt.  Normal bowel sounds heard. Central nervous system: Alert and oriented. No focal neurological deficits. Extremities: trace edema bilaterally Skin: No rashes, lesions or ulcers Psychiatry: Judgement and insight appear normal. Mood & affect appropriate.      Data Review:    CBC Recent Labs  Lab 03/24/18 1235  WBC 8.5  HGB 17.0  HCT 53.3*  PLT 152  MCV 91.6  MCH 29.2  MCHC 31.9  RDW 16.0*  LYMPHSABS 1.8  MONOABS 1.0  EOSABS 0.3  BASOSABS 0.1    Chemistries  Recent Labs  Lab 03/24/18 1308 03/25/18 0626 03/26/18 0626 03/27/18 0551 03/28/18 0515 03/29/18 0447  NA  --  139 141 139 135 135  K  --  3.7 4.8 3.7 4.8 5.7*  CL  --  105 105 101 102 104  CO2  --  25 28 29 26 23   GLUCOSE  --  117* 95 66* 95 101*  BUN  --  21 24* 34* 35* 44*  CREATININE  --  1.02 1.25* 1.47* 1.20 1.24  CALCIUM  --  8.7* 9.2 8.7* 8.8* 8.9  MG  --   --  2.2  --   --  2.4  AST 18 15  --   --   --   --   ALT 20 20  --   --   --   --   ALKPHOS 75 78  --   --   --   --   BILITOT 1.8* 1.4*  --   --   --   --    ------------------------------------------------------------------------------------------------------------------ No results for input(s): CHOL, HDL, LDLCALC, TRIG, CHOLHDL, LDLDIRECT in the last 72 hours.  No results found for: HGBA1C ------------------------------------------------------------------------------------------------------------------ No results for input(s): TSH, T4TOTAL,  T3FREE, THYROIDAB in the last 72 hours.  Invalid input(s): FREET3 ------------------------------------------------------------------------------------------------------------------ No results for input(s): VITAMINB12, FOLATE, FERRITIN, TIBC, IRON, RETICCTPCT in the last 72 hours.  Coagulation profile No results for input(s): INR, PROTIME in the last 168 hours.  No results for input(s): DDIMER in the last 72 hours.  Cardiac Enzymes Recent Labs  Lab 03/24/18 1235  TROPONINI <0.03    ------------------------------------------------------------------------------------------------------------------    Component Value Date/Time   BNP 545.0 (H) 03/24/2018 1235    Inpatient Medications  Scheduled Meds:  amiodarone  200 mg Oral Daily   apixaban  5 mg Oral BID   budesonide (PULMICORT) nebulizer solution  0.25 mg Nebulization BID   carvedilol  18.75 mg Oral BID WC   dextromethorphan-guaiFENesin  1 tablet Oral BID   furosemide  40 mg Oral Daily   ipratropium-albuterol  3 mL Nebulization TID   lisinopril  2.5 mg Oral Daily   methylPREDNISolone (SOLU-MEDROL) injection  60 mg Intravenous Q12H   [START ON 03/30/2018] potassium chloride  20 mEq Oral Daily   sodium chloride flush  3 mL Intravenous Q12H   Continuous Infusions:  sodium chloride     PRN Meds:.sodium chloride, acetaminophen, albuterol, diltiazem, guaiFENesin-dextromethorphan, ondansetron (ZOFRAN) IV, sodium chloride flush  Micro Results No results found for this or any previous visit (from the past 240 hour(s)).  Radiology Reports Dg Chest 2 View  Result Date: 03/24/2018 CLINICAL DATA:  Shortness of breath. EXAM: CHEST - 2 VIEW COMPARISON:  Radiographs of October 15, 2016. FINDINGS: Stable cardiomediastinal silhouette. Status post coronary bypass graft. No pneumothorax is noted. Mild lingular subsegmental atelectasis is noted. Minimal right pleural effusion is noted with associated subsegmental atelectasis. Bony thorax is unremarkable. IMPRESSION: Minimal right pleural effusion with associated right basilar subsegmental atelectasis. Mild lingular subsegmental atelectasis. Electronically Signed   By: Marijo Conception, M.D.   On: 03/24/2018 12:23    Time Spent in minutes  35   Kathie Dike M.D on 03/29/2018 at 7:16 PM  Between 7am to 7pm   After 7pm go to [email or web address]   [facility] -  Office  [phone]

## 2018-03-29 NOTE — Clinical Social Work Note (Signed)
Patient provided a scale for daily weights. Patient's daughter was observed explaining to patient when to weigh and when to call PCP.   Patient verbalized understanding.   Patient provided Eliquis education and 30 day card by pharmacy on 03/26/2018 by pharmacy.      Marsela Kuan, Clydene Pugh, LCSW

## 2018-03-29 NOTE — Progress Notes (Signed)
Patient ran a 20 beat run of VTach.  Patient asymptomatic.  On call MD notified via text page.  Will continue to monitor.

## 2018-03-29 NOTE — Progress Notes (Addendum)
Progress Note  Patient Name: Vincent Carter Date of Encounter: 03/29/2018  Primary Cardiologist: Carlyle Dolly, MD   Subjective   Says his breathing is close to baseline. Denies any chest pain or palpitations but did develop diaphoresis overnight. Patient unsure of exact time but says the nurse had checked on him right after so likely to have occurred at the time of his episode of NSVT.   Inpatient Medications    Scheduled Meds: . apixaban  5 mg Oral BID  . budesonide (PULMICORT) nebulizer solution  0.25 mg Nebulization BID  . carvedilol  18.75 mg Oral BID WC  . dextromethorphan-guaiFENesin  1 tablet Oral BID  . furosemide  40 mg Intravenous Daily  . ipratropium-albuterol  3 mL Nebulization TID  . lisinopril  2.5 mg Oral Daily  . potassium chloride  40 mEq Oral BID  . sodium chloride flush  3 mL Intravenous Q12H   Continuous Infusions: . sodium chloride     PRN Meds: sodium chloride, acetaminophen, albuterol, diltiazem, guaiFENesin-dextromethorphan, ondansetron (ZOFRAN) IV, sodium chloride flush   Vital Signs    Vitals:   03/28/18 2120 03/29/18 0500 03/29/18 0500 03/29/18 0732  BP: (!) 141/92  103/77   Pulse: 92  66   Resp: 20  (!) 22   Temp: 97.9 F (36.6 C)  97.7 F (36.5 C)   TempSrc: Oral  Oral   SpO2: 96%  95% 94%  Weight:  69.1 kg    Height:        Intake/Output Summary (Last 24 hours) at 03/29/2018 0801 Last data filed at 03/29/2018 0500 Gross per 24 hour  Intake 240 ml  Output 1800 ml  Net -1560 ml    Last 3 Weights 03/29/2018 03/28/2018 03/27/2018  Weight (lbs) 152 lb 5.4 oz 158 lb 1.1 oz 154 lb 14.4 oz  Weight (kg) 69.1 kg 71.7 kg 70.262 kg      Telemetry    Atrial fibrillation, HR well-controlled in 60's to 90's. Run of 20 beat NSVT overnight.  - Personally Reviewed  ECG    No new tracings.   Physical Exam   General: Well developed, well nourished, male appearing in no acute distress. Head: Normocephalic, atraumatic.  Neck: Supple  without bruits, JVD not elevated. Lungs:  Resp regular and unlabored, expiratory wheezing with scattered rhonchi. Heart: Irregularly irregular, S1, S2, no S3, S4, or murmur; no rub. Abdomen: Soft, non-tender, non-distended with normoactive bowel sounds. No hepatomegaly. No rebound/guarding. No obvious abdominal masses. Extremities: No clubbing or cyanosis, trace lower extremity edema. Distal pedal pulses are 2+ bilaterally. Neuro: Alert and oriented X 3. Moves all extremities spontaneously. Psych: Normal affect.  Labs    Chemistry Recent Labs  Lab 03/24/18 1308 03/25/18 0626  03/27/18 0551 03/28/18 0515 03/29/18 0447  NA  --  139   < > 139 135 135  K  --  3.7   < > 3.7 4.8 5.7*  CL  --  105   < > 101 102 104  CO2  --  25   < > 29 26 23   GLUCOSE  --  117*   < > 66* 95 101*  BUN  --  21   < > 34* 35* 44*  CREATININE  --  1.02   < > 1.47* 1.20 1.24  CALCIUM  --  8.7*   < > 8.7* 8.8* 8.9  PROT 6.1* 6.4*  --   --   --   --   ALBUMIN 3.7 3.7  --   --   --   --  AST 18 15  --   --   --   --   ALT 20 20  --   --   --   --   ALKPHOS 75 78  --   --   --   --   BILITOT 1.8* 1.4*  --   --   --   --   GFRNONAA  --  >60   < > 46* 59* 57*  GFRAA  --  >60   < > 54* >60 >60  ANIONGAP  --  9   < > 9 7 8    < > = values in this interval not displayed.     Hematology Recent Labs  Lab 03/24/18 1235  WBC 8.5  RBC 5.82*  HGB 17.0  HCT 53.3*  MCV 91.6  MCH 29.2  MCHC 31.9  RDW 16.0*  PLT 152    Cardiac Enzymes Recent Labs  Lab 03/24/18 1235  TROPONINI <0.03   No results for input(s): TROPIPOC in the last 168 hours.   BNP Recent Labs  Lab 03/24/18 1235  BNP 545.0*     DDimer No results for input(s): DDIMER in the last 168 hours.   Radiology    No results found.  Cardiac Studies   Echocardiogram: 03/24/2018 IMPRESSIONS    1. The left ventricle has mild-moderately reduced systolic function, with an ejection fraction of 40-45%. The cavity size was normal. There is  mild concentric left ventricular hypertrophy. Left ventricular diastolic function could not be evaluated  secondary to atrial fibrillation. Indeterminate filling pressures Left ventricular diffuse hypokinesis.  2. The mitral valve is grossly normal. There is mild mitral annular calcification present.  3. The tricuspid valve is grossly normal.  4. The aortic valve is tricuspid Mild thickening of the aortic valve Sclerosis without any evidence of stenosis of the aortic valve. Aortic valve regurgitation is trivial by color flow Doppler.  5. The aortic root is normal in size and structure.  Patient Profile     73 y.o. male w/ PMH of CAD (s/p CABG in 1997 with re-do CABG in 2006), ischemic cardiomyopathy (EF 45-50% by echo in 2015, at 40-45% this admission), HTN, and HLD who presented on 03/24/2018 for evaluation of worsening dyspnea and lower extremity edema. Found to have new-onset atrial fibrillation. Admitted for further management of his CHF, arrhythmia, and COPD  Assessment & Plan    1. Acute on Chronic Combined Systolic and Diastolic CHF - BNP elevated to 545 on admission and CXR consistent with CHF. Repeat echo shows EF is reduced to 40-45%.  - was initially receiving IV Lasix 40mg  BID and this was reduced to daily dosing on 3/16 due to up-trending creatinine. Peaked at 1.47, creatinine stable at 1.24 today.  - he is overall -8.5L this admission and weight has declined from 168 lbs on admission to 152 lbs today. Volume status significantly improved by examination. Will stop IV Lasix and switch to PO Lasix 40mg  daily. Will also adjust K-dur dosing as he was mildly hyperkalemic by labs this AM.  - remains on Coreg 18.75mg  BID and Lisinopril 2.5mg  daily.   2. CAD - he is s/p CABG in 1997 with re-do CABG in 2006. Denies any recent chest pain.  - Not on statin therapy given prior intolerance (LDL at 48 this admission). ASA discontinued given the initiation of Eliquis. Continue BB therapy.   3.  New-Onset Atrial Fibrillation - he has been started on Eliquis 5mg  BID for anticoagulation. Case management has  been consulted for benefits check.  - rates well-controlled on PO Coreg. Avoid Cardizem given reduced EF.   4. HTN - BP has been variable at 103/77 - 141/92 within the past 24 hours.  - currently on Coreg 18.75 mg BID and Lisinopril 2.5mg  daily. Would consider titration of Coreg to 25mg  BID given runs of NSVT if BP allows.   5. COPD Exacerbation - finished steroid course. Still receiving scheduled nebulizers.  - Further management per admitting team.   6. NSVT - had a 20 beat run of NSVT overnight. Denied any chest pain or dyspnea but did experience diaphoresis. Had experienced episodes of NSVT earlier this admission ranging from 8 to 12 beats.  - K+ actually 5.7 today (will hold K+ supplementation today and reduce back to 20 mEq daily). Mg 2.2 on 3/15. Will add-on Mg to AM labs. Recheck 12-Lead EKG. Last ischemic evaluation was in 2015 with an NST which showed scar with no ischemia. Would consider repeat ischemic testing as an outpatient.  - consider titration of BB therapy as outlined above.     For questions or updates, please contact [place] Please consult [email or web address] for contact info under Cardiology/STEMI.   Signed, Erma Heritage , PA-C 8:01 AM 03/29/2018 Pager: [phone]  The patient was seen and examined, and I agree with the history, physical exam, assessment and plan as documented above.  He is feeling better this morning.  His breathing is comfortable.  He continues to diurese well and has been switched to oral Lasix 40 mg daily.  He did have an episodes of nonsustained ventricular tachycardia overnight and was diaphoretic, 1 5 beat run in 120 beat run.  He denies chest pain.   He will need an outpatient ischemic evaluation.   He is mildly hyperkalemic today so potassium will be held.  Magnesium is normal at 2.4.  ECG shows rate controlled  atrial fibrillation. BP is presently low normal so I am unable to increase the dose of carvedilol.    Kate Sable, MD, [facility]  03/29/2018 8:55 AM

## 2018-03-29 NOTE — Care Management Important Message (Signed)
Important Message  Patient Details  Name: Vincent Carter MRN: [id number] Date of Birth: 15-Oct-1945   Medicare Important Message Given:  Yes    Tommy Medal 03/29/2018, 1:54 PM

## 2018-03-30 LAB — BASIC METABOLIC PANEL
Anion gap: 8 (ref 5–15)
BUN: 42 mg/dL — AB (ref 8–23)
CO2: 26 mmol/L (ref 22–32)
Calcium: 9.2 mg/dL (ref 8.9–10.3)
Chloride: 100 mmol/L (ref 98–111)
Creatinine, Ser: 1.3 mg/dL — ABNORMAL HIGH (ref 0.61–1.24)
GFR calc Af Amer: >60 mL/min (ref >60)
GFR calc non Af Amer: 54 mL/min — ABNORMAL LOW (ref >60)
Glucose, Bld: 154 mg/dL — ABNORMAL HIGH (ref 70–99)
Potassium: 5.2 mmol/L — ABNORMAL HIGH (ref 3.5–5.1)
Sodium: 134 mmol/L — ABNORMAL LOW (ref 135–145)

## 2018-03-30 MED ORDER — APIXABAN 5 MG PO TABS: 5.0000 mg | ORAL_TABLET | Freq: Two times a day (BID) | ORAL | 1 refills | Status: DC

## 2018-03-30 MED ORDER — PREDNISONE 10 MG PO TABS: ORAL_TABLET | ORAL | 0 refills | Status: DC

## 2018-03-30 MED ORDER — FUROSEMIDE 40 MG PO TABS: 40.0000 mg | ORAL_TABLET | Freq: Every day | ORAL | 1 refills | Status: DC

## 2018-03-30 MED ORDER — BUDESONIDE-FORMOTEROL FUMARATE 80-4.5 MCG/ACT IN AERO: 2.0000 | INHALATION_SPRAY | Freq: Two times a day (BID) | RESPIRATORY_TRACT | 12 refills | Status: DC

## 2018-03-30 MED ORDER — DM-GUAIFENESIN ER 30-600 MG PO TB12: 1.0000 | ORAL_TABLET | Freq: Two times a day (BID) | ORAL | 0 refills | Status: AC

## 2018-03-30 MED ORDER — AMIODARONE HCL 200 MG PO TABS: 200.0000 mg | ORAL_TABLET | Freq: Every day | ORAL | 0 refills | Status: DC

## 2018-03-30 MED ORDER — CARVEDILOL 12.5 MG PO TABS: 18.7500 mg | ORAL_TABLET | Freq: Two times a day (BID) | ORAL | 0 refills | Status: DC

## 2018-03-30 MED ORDER — ALBUTEROL SULFATE (2.5 MG/3ML) 0.083% IN NEBU: 2.5000 mg | INHALATION_SOLUTION | RESPIRATORY_TRACT | 12 refills | Status: DC | PRN

## 2018-03-30 MED ORDER — NEBULIZER MISC: 0 refills | Status: AC

## 2018-03-30 NOTE — Progress Notes (Signed)
Removed IV-clean, dry, intact. Reviewed d/c paperwork with patient. Reviewed new medications. Asked daughter to go to [facility] to pick up prescriptions before they closed for the day. Answered  All questions. Wheeled stable patient and belongings to short stay entrance where he was picked up by daughter.

## 2018-03-30 NOTE — Progress Notes (Addendum)
Progress Note  Patient Name: Vincent Carter Date of Encounter: 03/30/2018  Primary Cardiologist: Carlyle Dolly, MD   Subjective   Breathing back to baseline per patient's report. No chest pain or palpitations. Wants to go home if possible.   Inpatient Medications    Scheduled Meds: . amiodarone  200 mg Oral Daily  . apixaban  5 mg Oral BID  . budesonide (PULMICORT) nebulizer solution  0.25 mg Nebulization BID  . carvedilol  18.75 mg Oral BID WC  . dextromethorphan-guaiFENesin  1 tablet Oral BID  . furosemide  40 mg Oral Daily  . ipratropium-albuterol  3 mL Nebulization TID  . methylPREDNISolone (SOLU-MEDROL) injection  60 mg Intravenous Q12H  . sodium chloride flush  3 mL Intravenous Q12H   Continuous Infusions: . sodium chloride     PRN Meds: sodium chloride, acetaminophen, albuterol, diltiazem, guaiFENesin-dextromethorphan, ondansetron (ZOFRAN) IV, sodium chloride flush   Vital Signs    Vitals:   03/29/18 1923 03/29/18 2120 03/30/18 0508 03/30/18 0844  BP:  100/69 (!) 112/58   Pulse:  73 69   Resp:  16 16   Temp:  98.1 F (36.7 C) 98.7 F (37.1 C)   TempSrc:  Oral Oral   SpO2: 93% 93% 91% 94%  Weight:   68.2 kg   Height:        Intake/Output Summary (Last 24 hours) at 03/30/2018 0950 Last data filed at 03/30/2018 0932 Gross per 24 hour  Intake 600 ml  Output 1250 ml  Net -650 ml    Last 3 Weights 03/30/2018 03/29/2018 03/28/2018  Weight (lbs) 150 lb 5.7 oz 152 lb 5.4 oz 158 lb 1.1 oz  Weight (kg) 68.2 kg 69.1 kg 71.7 kg      Telemetry    Atrial flutter, HR in 70's to 90's with occasional PVC's. 6 beats NSVT.  - Personally Reviewed  ECG    Atrial flutter, HR 78, with anterior infarct pattern. No acute ST abnormalities when compared to prior tracings.   - Personally Reviewed  Physical Exam   General: Well developed, elderly Caucasian male appearing in no acute distress. Head: Normocephalic, atraumatic.  Neck: Supple without bruits, JVD not  elevated. Lungs:  Resp regular and unlabored, rhonchi throughout lung fields bilaterally. Heart: Irregularly irregular, S1, S2, no S3, S4, or murmur; no rub. Abdomen: Soft, non-tender, non-distended with normoactive bowel sounds. No hepatomegaly. No rebound/guarding. No obvious abdominal masses. Extremities: No clubbing or cyanosis, trace edema. Distal pedal pulses are 2+ bilaterally. Neuro: Alert and oriented X 3. Moves all extremities spontaneously. Psych: Normal affect.  Labs    Chemistry Recent Labs  Lab 03/24/18 1308 03/25/18 0626  03/27/18 0551 03/28/18 0515 03/29/18 0447  NA  --  139   < > 139 135 135  K  --  3.7   < > 3.7 4.8 5.7*  CL  --  105   < > 101 102 104  CO2  --  25   < > 29 26 23   GLUCOSE  --  117*   < > 66* 95 101*  BUN  --  21   < > 34* 35* 44*  CREATININE  --  1.02   < > 1.47* 1.20 1.24  CALCIUM  --  8.7*   < > 8.7* 8.8* 8.9  PROT 6.1* 6.4*  --   --   --   --   ALBUMIN 3.7 3.7  --   --   --   --   AST  18 15  --   --   --   --   ALT 20 20  --   --   --   --   ALKPHOS 75 78  --   --   --   --   BILITOT 1.8* 1.4*  --   --   --   --   GFRNONAA  --  >60   < > 46* 59* 57*  GFRAA  --  >60   < > 54* >60 >60  ANIONGAP  --  9   < > 9 7 8    < > = values in this interval not displayed.     Hematology Recent Labs  Lab 03/24/18 1235  WBC 8.5  RBC 5.82*  HGB 17.0  HCT 53.3*  MCV 91.6  MCH 29.2  MCHC 31.9  RDW 16.0*  PLT 152    Cardiac Enzymes Recent Labs  Lab 03/24/18 1235  TROPONINI <0.03   No results for input(s): TROPIPOC in the last 168 hours.   BNP Recent Labs  Lab 03/24/18 1235  BNP 545.0*     DDimer No results for input(s): DDIMER in the last 168 hours.   Radiology    No results found.  Cardiac Studies   Echocardiogram: 03/24/2018 IMPRESSIONS    1. The left ventricle has mild-moderately reduced systolic function, with an ejection fraction of 40-45%. The cavity size was normal. There is mild concentric left ventricular  hypertrophy. Left ventricular diastolic function could not be evaluated  secondary to atrial fibrillation. Indeterminate filling pressures Left ventricular diffuse hypokinesis.  2. The mitral valve is grossly normal. There is mild mitral annular calcification present.  3. The tricuspid valve is grossly normal.  4. The aortic valve is tricuspid Mild thickening of the aortic valve Sclerosis without any evidence of stenosis of the aortic valve. Aortic valve regurgitation is trivial by color flow Doppler.  5. The aortic root is normal in size and structure.  Patient Profile     73 y.o. male w/ PMH of CAD (s/p CABG in 1997 with re-do CABG in 2006), ischemic cardiomyopathy (EF 45-50% by echo in 2015, at 40-45% this admission), HTN, and HLD who presented on 03/24/2018 for evaluation of worsening dyspnea and lower extremity edema. Found to have new-onset atrial fibrillation. Admitted for further management of his CHF,arrhythmia, and COPD.   Assessment & Plan    1. Acute on Chronic Combined Systolic and Diastolic CHF - BNP elevated to 545 on admission and CXR consistent with CHF. Repeat echo shows EF is reduced to 40-45%.  - was initially receiving IV Lasix 40mg  BID and this was reduced to daily dosing on 3/16 due to up-trending creatinine. Peaked at 1.47, creatinine stable at 1.24 on 3/18. Will recheck today.  - he is overall -9.2L this admission and weight has declined from 168 lbs on admission to 150 lbs today.Volume status significantly improved by examination. Was switched to PO Lasix 40mg  daily on 03/29/2018. Will order BMET for today.  - remains on Coreg 18.75mg  BID and Lisinopril 2.5mg  daily.   2. CAD - he is s/p CABG in 1997 with re-do CABG in 2006. Denies any recent chest pain.  - Continue BB. Not on statin therapy given prior intolerance(LDL at 48 this admission). ASA discontinued this admission given the initiation of anticoagulation.   3. New-Onset Atrial Fibrillation/Flutter - repeat  EKG yesterday seemed most consistent with flutter. He has been started on Eliquis 5mg  BID for anticoagulation.  - rates well-controlled  on PO Coreg 18.75mg  BID. Avoid Cardizem given reduced EF.   4. HTN - BP has been variable at 91/58 - 112/69 within the past 24 hours.  - currently on Coreg 18.75 mg BID and Lisinopril 2.5mg  daily.   5. COPD Exacerbation - finished steroid course. Still receiving scheduled nebulizers.  - Further management per admitting team.  6. NSVT - had a 20 beat run of NSVT overnight on 3/17. Denied any chest pain or dyspnea but did experience diaphoresis. Had experienced episodes of NSVT earlier this admission ranging from 8 to 12 beats. 6 beats overnight.  - K+ was slightly elevated on 3/18. Mg within normal limits. Dr. Bronson Ing recommended starting low-dose Amiodarone 200mg  daily for now as unable to further titrate BB therapy given soft BP.   - Would consider repeat ischemic testing as an outpatient once Coronavirus outbreak has improved.     For questions or updates, please contact [place] Please consult [email or web address] for contact info under Cardiology/STEMI.   Signed, Erma Heritage , PA-C 9:50 AM 03/30/2018 Pager: [phone]  Patient seen and examined   I agree with findings as noted by B Strader above Pt says his breathign is improved since admit On exam: Lungs   Decreased airflow  Diffuse rhonchi Cardiac Irreg irreg  No S3 Ext are without edema  I would keep on current regimen   COntinue amiodarone. I think pt is able to go home      Will make sure he has f/u at some point, depending on corona virus Pt at high risk if he gets infected  Dorris Carnes

## 2018-03-30 NOTE — Plan of Care (Signed)
SATURATION QUALIFICATIONS: (This note is used to comply with regulatory documentation for home oxygen)  Patient Saturations on Room Air at Rest = 93%  Patient Saturations on Room Air while Ambulating = 93-94%  Patient Saturations on 1 Liter of oxygen while Ambulating = 96%  Please briefly explain why patient needs home oxygen:

## 2018-03-30 NOTE — Plan of Care (Signed)
Patient has ambulated without oxygen around the unit. He tolerated ambulation well. Upon completion he became a little short of breath but quickly returned to baseline. He has been voiding appropriately and denies pain.

## 2018-03-31 NOTE — Discharge Summary (Addendum)
Physician Discharge Summary  Vincent Carter:[id number] DOB: June 28, 1945 DOA: 03/24/2018  PCP: [name], MD  Admit date: 03/24/2018 Discharge date: 03/30/2018  Admitted From: Home Disposition: Home  Recommendations for Outpatient Follow-up:  1. Follow up with PCP in 1-2 weeks 2. Please obtain BMP/CBC in one week 3. Follow-up with cardiology for further ischemic testing  Home Health: Equipment/Devices: Nebulizer machine  Discharge Condition: Stable CODE STATUS: Full code Diet recommendation: Heart healthy  Brief/Interim Summary: 73 year old male history of coronary artery disease, systolic heart failure, COPD, peripheral vascular disease, presents to the emergency room with lower extremity edema and increasing shortness of breath.  He was found to be in decompensated CHF.  He was admitted for further treatment.  Discharge Diagnoses:  Principal Problem:   Acute on chronic systolic CHF (congestive heart failure) (HCC) Active Problems:   Hypertension   ASCVD (arteriosclerotic cardiovascular disease)   COPD with acute exacerbation ([place])   New onset atrial fibrillation (HCC)   Atrial fibrillation with RVR ([place])  1. Acute on chronic systolic congestive heart failure.  Patient was diuresed with intravenous Lasix.  He had a negative fluid balance of -8.5 L.  He was followed by cardiology.  Echocardiogram showed ejection fraction 40 to 45%.  After achieving euvolemia, he was transitioned to oral Lasix.  He is continued on aspirin, beta-blocker, statin and lisinopril. 2. Acute respiratory failure with hypoxia.  Related to CHF exacerbation and COPD exacerbation.  He is now breathing comfortably on room air. 3. COPD exacerbation.  Treated with intravenous steroids, bronchodilators.  Transition to prednisone taper.  He can be discharged home with a nebulizer machine. 4. New onset atrial fibrillation.  Patient is being treated with oral carvedilol.  Heart rate is reasonably  controlled.  He has a [place] vas score of 4 and is on Eliquis.  2D echocardiogram was performed.  TSH was normal.  He did have some episodes of nonsustained ventricular tachycardia.  He was started on 2-week course of amiodarone.  He will follow-up with cardiology. 5. Acute kidney injury.  Mild, secondary to diuresis.  Creatinine has since stabilized.  Discharge Instructions  Discharge Instructions    Diet - low sodium heart healthy   Complete by:  As directed    Increase activity slowly   Complete by:  As directed      Allergies as of 03/30/2018      Reactions   Codeine Nausea Only   Pill form of codeine causes nausea   Statins Other (See Comments)   Myalgia       Medication List    STOP taking these medications   aspirin EC 81 MG tablet     TAKE these medications   albuterol (2.5 MG/3ML) 0.083% nebulizer solution Commonly known as:  PROVENTIL Take 3 mLs (2.5 mg total) by nebulization every 4 (four) hours as needed for shortness of breath.   amiodarone 200 MG tablet Commonly known as:  PACERONE Take 1 tablet (200 mg total) by mouth daily.   apixaban 5 MG Tabs tablet Commonly known as:  ELIQUIS Take 1 tablet (5 mg total) by mouth 2 (two) times daily.   budesonide-formoterol 80-4.5 MCG/ACT inhaler Commonly known as:  Symbicort Inhale 2 puffs into the lungs 2 (two) times daily.   carvedilol 12.5 MG tablet Commonly known as:  COREG Take 1.5 tablets (18.75 mg total) by mouth 2 (two) times daily. What changed:    medication strength  how much to take   dextromethorphan-guaiFENesin 30-600 MG 12hr tablet  Commonly known as:  MUCINEX DM Take 1 tablet by mouth 2 (two) times daily.   Fish Oil 300 MG Caps Take 2 capsules by mouth 2 (two) times daily.   fluticasone 50 MCG/ACT nasal spray Commonly known as:  FLONASE Place 1 spray into both nostrils 2 (two) times daily.   furosemide 40 MG tablet Commonly known as:  LASIX Take 1 tablet (40 mg total) by mouth daily.    lisinopril 2.5 MG tablet Commonly known as:  PRINIVIL,ZESTRIL Take 1 tablet (2.5 mg total) by mouth daily.   Nebulizer Misc Please provide 1 nebulizer machine and all supplies. Diagnosis: J44.1, Office number: [phone]   nitroGLYCERIN 0.4 MG SL tablet Commonly known as:  NITROSTAT Place 1 tablet (0.4 mg total) under the tongue every 5 (five) minutes as needed for chest pain.   predniSONE 10 MG tablet Commonly known as:  DELTASONE Take 40mg  po daily for 2 days then 30mg  daily for 2 days then 20mg  daily for 2 days then 10mg  daily for 2 days then stop      Follow-up Information    Cory Munch, PA-C. Go in 5 day(s).   Specialties:  Physician Assistant, Internal Medicine Why:  Appointment time is 9:45 AM. Please arrive 30 minutes prior to your appointment. Dr. Gerarda Fraction will be out of the office at the time of your appointment and will resume your care upon his return.  Contact information: [place] [phone]          Allergies  Allergen Reactions  . Codeine Nausea Only    Pill form of codeine causes nausea  . Statins Other (See Comments)    Myalgia     Consultations:  Cardiology   Procedures/Studies: Dg Chest 2 View  Result Date: 03/24/2018 CLINICAL DATA:  Shortness of breath. EXAM: CHEST - 2 VIEW COMPARISON:  Radiographs of October 15, 2016. FINDINGS: Stable cardiomediastinal silhouette. Status post coronary bypass graft. No pneumothorax is noted. Mild lingular subsegmental atelectasis is noted. Minimal right pleural effusion is noted with associated subsegmental atelectasis. Bony thorax is unremarkable. IMPRESSION: Minimal right pleural effusion with associated right basilar subsegmental atelectasis. Mild lingular subsegmental atelectasis. Electronically Signed   By: Marijo Conception, M.D.   On: 03/24/2018 12:23       Subjective: Shortness of breath is better.  Feels that he is approaching baseline.  Discharge  Exam: Vitals:   03/30/18 0508 03/30/18 0844 03/30/18 1402 03/30/18 1454  BP: (!) 112/58   120/70  Pulse: 69   65  Resp: 16   18  Temp: 98.7 F (37.1 C)   98.5 F (36.9 C)  TempSrc: Oral   Oral  SpO2: 91% 94% 91% 90%  Weight: 68.2 kg     Height:        General: Pt is alert, awake, not in acute distress Cardiovascular: RRR, S1/S2 +, no rubs, no gallops Respiratory: Scattered rhonchi, no wheezing Abdominal: Soft, NT, ND, bowel sounds + Extremities: no edema, no cyanosis    The results of significant diagnostics from this hospitalization (including imaging, microbiology, ancillary and laboratory) are listed below for reference.     Microbiology: No results found for this or any previous visit (from the past 240 hour(s)).   Labs: BNP (last 3 results) Recent Labs    03/24/18 1235  BNP 735.3*   Basic Metabolic Panel: Recent Labs  Lab 03/26/18 0626 03/27/18 0551 03/28/18 0515 03/29/18 0447 03/30/18 1052  NA 141 139 135 135  134*  K 4.8 3.7 4.8 5.7* 5.2*  CL 105 101 102 104 100  CO2 28 29 26 23 26   GLUCOSE 95 66* 95 101* 154*  BUN 24* 34* 35* 44* 42*  CREATININE 1.25* 1.47* 1.20 1.24 1.30*  CALCIUM 9.2 8.7* 8.8* 8.9 9.2  MG 2.2  --   --  2.4  --    Liver Function Tests: Recent Labs  Lab 03/25/18 0626  AST 15  ALT 20  ALKPHOS 78  BILITOT 1.4*  PROT 6.4*  ALBUMIN 3.7   No results for input(s): LIPASE, AMYLASE in the last 168 hours. No results for input(s): AMMONIA in the last 168 hours. CBC: No results for input(s): WBC, NEUTROABS, HGB, HCT, MCV, PLT in the last 168 hours. Cardiac Enzymes: No results for input(s): CKTOTAL, CKMB, CKMBINDEX, TROPONINI in the last 168 hours. BNP: Invalid input(s): POCBNP CBG: No results for input(s): GLUCAP in the last 168 hours. D-Dimer No results for input(s): DDIMER in the last 72 hours. Hgb A1c No results for input(s): HGBA1C in the last 72 hours. Lipid Profile No results for input(s): CHOL, HDL, LDLCALC, TRIG,  CHOLHDL, LDLDIRECT in the last 72 hours. Thyroid function studies No results for input(s): TSH, T4TOTAL, T3FREE, THYROIDAB in the last 72 hours.  Invalid input(s): FREET3 Anemia work up No results for input(s): VITAMINB12, FOLATE, FERRITIN, TIBC, IRON, RETICCTPCT in the last 72 hours. Urinalysis No results found for: COLORURINE, APPEARANCEUR, LABSPEC, [place], GLUCOSEU, HGBUR, BILIRUBINUR, KETONESUR, PROTEINUR, UROBILINOGEN, NITRITE, LEUKOCYTESUR Sepsis Labs Invalid input(s): PROCALCITONIN,  WBC,  LACTICIDVEN Microbiology No results found for this or any previous visit (from the past 240 hour(s)).   Time coordinating discharge: 65mins  SIGNED:   Kathie Dike, MD  [facility] 03/31/2018, 8:01 PM   If 7PM-7AM, please contact night-coverage [email or web address]

## 2018-04-04 ENCOUNTER — Other Ambulatory Visit: Payer: Self-pay

## 2018-04-04 NOTE — Patient Outreach (Signed)
[place] [facility]) Care Management  04/04/2018  Vincent Carter 12/21/1945 [id number]   Referral received from LCSW for chronic disease management. Successful outreach with Vincent Carter. Reports feeling well since hospital discharge. PCP follow up delayed due to current respiratory restrictions. Reports nonproductive cough. No complaints of shortness of breath. No complaints of chest discomfort or increased edema. Reports morning weight of 149lbs. Vincent Carter is agreeable to ongoing outreach but prefers to complete the initial assessment when his daughter-in-law, [name] is available. Denies urgent care management needs. Denies concerns regarding transportation or medication affordability. Agreeable to completing telephonic assessment on 04/10/18. Contact information provided. Encouraged to contact RNCM if needed prior to scheduled outreach.  PLAN Will complete telephonic assessment on 04/10/18.   [place] [phone]

## 2018-04-07 DIAGNOSIS — J449 Chronic obstructive pulmonary disease, unspecified: Secondary | ICD-10-CM | POA: Diagnosis not present

## 2018-04-07 DIAGNOSIS — I509 Heart failure, unspecified: Secondary | ICD-10-CM | POA: Diagnosis not present

## 2018-04-07 DIAGNOSIS — Z1389 Encounter for screening for other disorder: Secondary | ICD-10-CM | POA: Diagnosis not present

## 2018-04-07 DIAGNOSIS — Z0001 Encounter for general adult medical examination with abnormal findings: Secondary | ICD-10-CM | POA: Diagnosis not present

## 2018-04-07 DIAGNOSIS — I4891 Unspecified atrial fibrillation: Secondary | ICD-10-CM | POA: Diagnosis not present

## 2018-04-10 ENCOUNTER — Other Ambulatory Visit: Payer: Self-pay

## 2018-05-02 ENCOUNTER — Other Ambulatory Visit: Payer: Self-pay

## 2018-05-02 NOTE — Patient Outreach (Signed)
[facility]) Care Management  05/02/2018  Vincent Carter 03-26-1945 [id number]    Successful outreach with Vincent Carter. No complaints of shortness of breath or chest discomfort. Reports feeling well at time of call but reports several episodes of back pain over the past few weeks. Reports he was previously examined, and tramadol was recommended. States he prefers not to take pain medication. Agreeable to MD follow up if symptoms persist or worsen.  Reports compliance with daily weights. Morning weight of 137lbs. Admits to skipping medication doses due to back pain and reports that he needs refills. Requested that I speak with his family member, Vincent Carter. Per Vincent Carter, refills were ordered and due for pick up on 05/13/18. She is concerned that he is not taking his medications correctly. Discussed option for compliance/adherence packages. She will discuss further with Vincent Carter. Agreeable to follow up later this week.  PLAN Will follow up on 05/04/18.   [place] [phone]

## 2018-05-05 ENCOUNTER — Other Ambulatory Visit: Payer: Self-pay

## 2018-05-05 NOTE — Patient Outreach (Signed)
[place] [facility]) Care Management  05/05/2018  Vincent Carter 22-Dec-1945 [id number]   Follow up outreach regarding medications. Per [name], Mr. Hyams is interested in using medication adherence packages and agreeable to outreach from [place] Will submit order and update [facility] Pharmacist. Will continue routine outreach with Mr. Manlove.   [place] [phone]

## 2018-05-11 ENCOUNTER — Other Ambulatory Visit: Payer: Self-pay

## 2018-05-11 DIAGNOSIS — J449 Chronic obstructive pulmonary disease, unspecified: Secondary | ICD-10-CM

## 2018-05-11 DIAGNOSIS — I5020 Unspecified systolic (congestive) heart failure: Secondary | ICD-10-CM

## 2018-05-11 NOTE — Patient Outreach (Signed)
[place] [facility]) Care Management    Vincent Carter 05-21-1945 [id number]  Telephonic assessment complete. Vincent Carter reports feeling well today and states he's "getting better." No complaints of shortness of breath or worsening symptoms. Reports compliance with medications and daily weights. Morning weight of 150lbs. Reports ambulating well and performing ADLs independently.  Discussed care management needs. Declined Pulmonology referral for COPD management. Vincent Carter lives alone but reports his family member, Vincent Carter is available to assist as needed. Declined need for transportation, additional assistance in the home, or referral for community resources. Denied concerns regarding medication management or affordability. Agreeable to routine outreach with [place].  [facility] CM Care Plan Problem One     Most Recent Value  Care Plan Problem One  Risk for Readmission  Role Documenting the Problem One  Care Management [place] for Problem One  Active  [facility] Long Term Goal   Over the next 60 days patient will not be readmitted for chronic disease related complications.  [facility] Long Term Goal Start Date  04/10/18  Interventions for Problem One Long Term Goal  Discussed medications, nutrition, weight parameters and safety precautions.  [facility] CM Short Term Goal #1   Over the next 30 days patient will take all medications as prescribed.  [facility] CM Short Term Goal #1 Start Date  04/10/18  Interventions for Short Term Goal #1  Reviewed medications and indications for use. Discussed importance of taking all medications as prescribed.  [Denies concerns regarding medication affordability.]  [facility] CM Short Term Goal #2   Over the next 30 days patient will complete all scheduled MD outreach.  [facility] CM Short Term Goal #2 Start Date  04/10/18  Interventions for Short Term Goal #2  Reviewed pending outreach, transportation needs and possible need for pulmonology referral.   [facility] CM Short Term Goal  #3  Over the next 30 days patient will monitor daily weights and record readings.  [facility] CM Short Term Goal #3 Start Date  04/10/18  Interventions for Short [place] Goal #3  Discussed weight parameters and indications for notifying MD. Patient encouraged to weigh daily and record readings.      PLAN Will continue routine outreach.   [place] [phone]

## 2018-05-16 ENCOUNTER — Ambulatory Visit: Payer: Self-pay | Admitting: Pharmacist

## 2018-05-17 ENCOUNTER — Other Ambulatory Visit: Payer: Medicare Other

## 2018-05-17 ENCOUNTER — Other Ambulatory Visit: Payer: Self-pay | Admitting: Cardiology

## 2018-05-17 NOTE — Patient Outreach (Signed)
[name] Care Management  05/17/2018  Vincent Carter 04-04-1945 [id number]    Follow up outreach with [name] caregiver, [name]. Reports [name] did not contact Cardiology as planned. Also reports pharmacy would not refill his furosemide. Call placed to Cardiology clinic. Scheduled outreach with Dr. Harl Bowie on 06/08/18.  [facility] Pharmacist contacted [facility] regarding pending refills.   PLAN Will follow up with [name] on tomorrow.   [place] [phone]

## 2018-05-18 ENCOUNTER — Telehealth: Payer: Self-pay | Admitting: *Deleted

## 2018-05-18 ENCOUNTER — Other Ambulatory Visit: Payer: Self-pay

## 2018-05-18 ENCOUNTER — Ambulatory Visit: Payer: Self-pay | Admitting: Pharmacist

## 2018-05-18 NOTE — Telephone Encounter (Signed)
Felecia w/ [facility] is wanting pt to be scheduled in coumadin clinic as a new pt, where should we schedule him?  Please call Felecia @ [phone]

## 2018-05-18 NOTE — Patient Outreach (Signed)
[facility]) Care Management  05/18/2018  Vincent Carter 07-26-1945 [id number]   Follow up outreach with [name]. Informed that furosemide was refilled and ready for pick up at [facility]. New Concern [name] reports that [name] is doing well but refusing to take Eliquis. He has a history of chronic back pain and corneal scarring. Per [name], he felt the medication was making the symptoms worse and claimed to feel better when not taking it. She suspects that his last dose was taken about two weeks ago. She is very knowledgeable of worsening s/sx and will contact EMS if his condition changes.  Cardiologist notified. Member is agreeable to warfarin therapy. Will establish services with anticoagulation clinic.   PLAN Will update [facility] Pharmacist. Will follow up tomorrow.    [place] [phone]

## 2018-05-19 ENCOUNTER — Other Ambulatory Visit: Payer: Self-pay

## 2018-05-19 NOTE — Telephone Encounter (Signed)
Per phone call from Edrick Oh- Pt is not on coumadin, he's on Eliquis and will not need new pt apt or an apt to see her in her coumadin clinic for 6 mths. [name] has reviewed labs and patient has f/u with cardiologist Dr. Harl Bowie on 06/08/2018.

## 2018-05-19 NOTE — Patient Outreach (Signed)
[place] [facility]) Care Management  05/19/2018  Vincent Carter 13-Nov-1945 [id number]   Follow up outreach with Vincent Carter. He reports feeling "very good" today. Denies complaints of chest discomfort or palpitations. No complaints of pain or shortness of breath. He is aware of pending outreach with the Coumadin clinic. Requested that I contact [name] once the appointment is confirmed.  PLAN Will contact [name] later today. Will follow up with Vincent Carter next week.   [place] [phone]

## 2018-05-19 NOTE — Telephone Encounter (Signed)
Vincent Carter, please disregard the previous message, [name] is working from home and is unable to see the patients chart correctly on her computer.   We will get Mr. Weatherspoon scheduled for this week. Sorry for the confusion!

## 2018-05-22 ENCOUNTER — Ambulatory Visit (INDEPENDENT_AMBULATORY_CARE_PROVIDER_SITE_OTHER): Payer: Medicare Other | Admitting: *Deleted

## 2018-05-22 ENCOUNTER — Other Ambulatory Visit: Payer: Self-pay

## 2018-05-22 ENCOUNTER — Ambulatory Visit: Payer: Self-pay | Admitting: Pharmacist

## 2018-05-22 ENCOUNTER — Other Ambulatory Visit: Payer: Self-pay | Admitting: Pharmacist

## 2018-05-22 DIAGNOSIS — I4891 Unspecified atrial fibrillation: Secondary | ICD-10-CM

## 2018-05-22 DIAGNOSIS — Z5181 Encounter for therapeutic drug level monitoring: Secondary | ICD-10-CM | POA: Insufficient documentation

## 2018-05-22 LAB — POCT INR: INR: 1 — AB (ref 2.0–3.0)

## 2018-05-22 MED ORDER — WARFARIN SODIUM 5 MG PO TABS: ORAL_TABLET | ORAL | 3 refills | Status: DC

## 2018-05-22 NOTE — Patient Instructions (Signed)
Start coumadin 1 tablet daily except 1/2 tablet on Fridays Recheck in 1 week Call [phone] for any questions or medication changes

## 2018-05-22 NOTE — Patient Outreach (Signed)
[name]) Care Management [place]  05/22/2018  Vincent Carter July 03, 1945 [id number]  Reason for referral: medication management/compliance packaging  Successful call to patient's pharmacy [facility]) regarding refills for Lasix and Coreg.  Requested both be faxed to Dr. Deretha Emory ([place]) for approval.  Pharmacy still had refills linked to [facility] Hospitalist from when patient was admitted.  Patient has appt on 05/22/18 and 06/08/18 with [facility].  Will follow up next month regarding potential compliance packaging to ensure patient is on stable doses. Case discussed with Neldon Labella, RN.  PLAN: -I will f/u at the end of the month regarding compliance packaging.  Regina Eck, PharmD, [place]  [phone]

## 2018-05-23 ENCOUNTER — Other Ambulatory Visit: Payer: Self-pay | Admitting: Cardiology

## 2018-05-26 ENCOUNTER — Telehealth: Payer: Self-pay | Admitting: *Deleted

## 2018-05-26 ENCOUNTER — Other Ambulatory Visit: Payer: Self-pay

## 2018-05-26 NOTE — Patient Outreach (Signed)
[place] [facility]) Care Management  05/26/2018  Vincent Carter 10/10/1945 [id number]   Follow-up outreach with Vincent Carter. Reports feeling very good today. Morning weight-139 lbs. Discussed recent change from eliquis to warfarin. He reports taking medications as prescribed and tolerating warfarin well. Denies previous symptoms of worsening back pain or visual changes. He will continue outreach with the Coumadin clinic.  PLAN Will continue routine outreach.   [place] [phone]

## 2018-05-26 NOTE — Telephone Encounter (Signed)

## 2018-05-29 ENCOUNTER — Ambulatory Visit: Payer: Self-pay

## 2018-05-29 ENCOUNTER — Other Ambulatory Visit: Payer: Self-pay

## 2018-05-29 ENCOUNTER — Ambulatory Visit (INDEPENDENT_AMBULATORY_CARE_PROVIDER_SITE_OTHER): Payer: Medicare Other | Admitting: *Deleted

## 2018-05-29 DIAGNOSIS — Z5181 Encounter for therapeutic drug level monitoring: Secondary | ICD-10-CM

## 2018-05-29 DIAGNOSIS — I4891 Unspecified atrial fibrillation: Secondary | ICD-10-CM

## 2018-05-29 LAB — POCT INR: INR: 5.3 — AB (ref 2.0–3.0)

## 2018-05-29 NOTE — Patient Instructions (Signed)
Hold coumadin tonight and tomorrow night then decrease dose to 1/2 tablet daily.  Recheck in 1 week. Bleeding and fall precautions discussed with pt/daughter in law and they verbalized understanding. Call [phone] for any questions or medication changes

## 2018-05-30 ENCOUNTER — Ambulatory Visit: Payer: Self-pay | Admitting: Pharmacist

## 2018-06-01 ENCOUNTER — Telehealth: Payer: Self-pay | Admitting: Cardiology

## 2018-06-01 NOTE — Telephone Encounter (Signed)
Virtual Visit Pre-Appointment Phone Call  "(Name), I am calling you today to discuss your upcoming appointment. We are currently trying to limit exposure to the virus that causes COVID-19 by seeing patients at home rather than in the office."  1. "What is the BEST phone number to call the day of the visit?" - include this in appointment notes  2. Do you have or have access to (through a family member/friend) a smartphone with video capability that we can use for your visit?" a. If yes - list this number in appt notes as cell (if different from BEST phone #) and list the appointment type as a VIDEO visit in appointment notes b. If no - list the appointment type as a PHONE visit in appointment notes  3. Confirm consent - "In the setting of the current Covid19 crisis, you are scheduled for a (phone or video) visit with your provider on (date) at (time).  Just as we do with many in-office visits, in order for you to participate in this visit, we must obtain consent.  If you'd like, I can send this to your mychart (if signed up) or email for you to review.  Otherwise, I can obtain your verbal consent now.  All virtual visits are billed to your insurance company just like a normal visit would be.  By agreeing to a virtual visit, we'd like you to understand that the technology does not allow for your provider to perform an examination, and thus may limit your provider's ability to fully assess your condition. If your provider identifies any concerns that need to be evaluated in person, we will make arrangements to do so.  Finally, though the technology is pretty good, we cannot assure that it will always work on either your or our end, and in the setting of a video visit, we may have to convert it to a phone-only visit.  In either situation, we cannot ensure that we have a secure connection.  Are you willing to proceed?" STAFF: Did the patient verbally acknowledge consent to telehealth visit? Document  YES/NO here: Yes  4. Advise patient to be prepared - "Two hours prior to your appointment, go ahead and check your blood pressure, pulse, oxygen saturation, and your weight (if you have the equipment to check those) and write them all down. When your visit starts, your provider will ask you for this information. If you have an Apple Watch or Kardia device, please plan to have heart rate information ready on the day of your appointment. Please have a pen and paper handy nearby the day of the visit as well."  5. Give patient instructions for MyChart download to smartphone OR [email or web address] as below if video visit (depending on what platform provider is using)  6. Inform patient they will receive a phone call 15 minutes prior to their appointment time (may be from unknown caller ID) so they should be prepared to answer    [place] has been deemed a candidate for a follow-up tele-health visit to limit community exposure during the Covid-19 pandemic. I spoke with the patient via phone to ensure availability of phone/video source, confirm preferred email & phone number, and discuss instructions and expectations.  I reminded Vincent Carter to be prepared with any vital sign and/or heart rhythm information that could potentially be obtained via home monitoring, at the time of his visit. I reminded Vincent Carter to expect a phone call prior to  his visit.  Vincent Carter 06/01/2018 10:55 AM

## 2018-06-06 ENCOUNTER — Other Ambulatory Visit: Payer: Self-pay

## 2018-06-06 ENCOUNTER — Ambulatory Visit (INDEPENDENT_AMBULATORY_CARE_PROVIDER_SITE_OTHER): Payer: Medicare Other | Admitting: *Deleted

## 2018-06-06 DIAGNOSIS — I4891 Unspecified atrial fibrillation: Secondary | ICD-10-CM | POA: Diagnosis not present

## 2018-06-06 DIAGNOSIS — Z5181 Encounter for therapeutic drug level monitoring: Secondary | ICD-10-CM | POA: Diagnosis not present

## 2018-06-06 LAB — POCT INR: INR: 2.4 (ref 2.0–3.0)

## 2018-06-06 NOTE — Patient Outreach (Signed)
[place] [facility]) Care Management  06/06/2018  CHRISTOS MIXSON [birth year] [id number]     Unsuccessful outreach attempt. Will attempt to contact Mr. Azimi later this week.   [place] [phone]

## 2018-06-06 NOTE — Patient Instructions (Signed)
Continue coumadin 1/2 tablet daily.  Recheck in 10 days. Call [phone] for any questions or medication changes

## 2018-06-07 ENCOUNTER — Ambulatory Visit: Payer: Self-pay | Admitting: Pharmacist

## 2018-06-08 ENCOUNTER — Telehealth (INDEPENDENT_AMBULATORY_CARE_PROVIDER_SITE_OTHER): Payer: Medicare Other | Admitting: Cardiology

## 2018-06-08 ENCOUNTER — Other Ambulatory Visit: Payer: Self-pay

## 2018-06-08 ENCOUNTER — Encounter: Payer: Self-pay | Admitting: Cardiology

## 2018-06-08 VITALS — BP 140/68 | HR 74 | Ht 62.0 in | Wt 139.0 lb

## 2018-06-08 DIAGNOSIS — I10 Essential (primary) hypertension: Secondary | ICD-10-CM | POA: Diagnosis not present

## 2018-06-08 DIAGNOSIS — I472 Ventricular tachycardia: Secondary | ICD-10-CM

## 2018-06-08 DIAGNOSIS — I2510 Atherosclerotic heart disease of native coronary artery without angina pectoris: Secondary | ICD-10-CM

## 2018-06-08 DIAGNOSIS — I4729 Other ventricular tachycardia: Secondary | ICD-10-CM

## 2018-06-08 DIAGNOSIS — E782 Mixed hyperlipidemia: Secondary | ICD-10-CM

## 2018-06-08 DIAGNOSIS — I4891 Unspecified atrial fibrillation: Secondary | ICD-10-CM

## 2018-06-08 DIAGNOSIS — I255 Ischemic cardiomyopathy: Secondary | ICD-10-CM

## 2018-06-08 DIAGNOSIS — I5022 Chronic systolic (congestive) heart failure: Secondary | ICD-10-CM

## 2018-06-08 MED ORDER — FUROSEMIDE 40 MG PO TABS: 40.0000 mg | ORAL_TABLET | Freq: Every day | ORAL | 3 refills | Status: DC

## 2018-06-08 NOTE — Progress Notes (Signed)
Virtual Visit via Video Note   This visit type was conducted due to national recommendations for restrictions regarding the COVID-19 Pandemic (e.g. social distancing) in an effort to limit this patient's exposure and mitigate transmission in our community.  Due to his co-morbid illnesses, this patient is at least at moderate risk for complications without adequate follow up.  This format is felt to be most appropriate for this patient at this time.  All issues noted in this document were discussed and addressed.  A limited physical exam was performed with this format.  Please refer to the patient's chart for his consent to telehealth for Vincent Carter.   Date:  06/08/2018   ID:  Vincent Carter, DOB 1945/07/05, MRN [id number]  Patient Location: Home Provider Location: Office  PCP:  Vincent School, MD  Cardiologist:  Vincent Dolly, MD  Electrophysiologist:  None   Evaluation Performed:  Follow-Up Visit  Chief Complaint:  3 month follow up  History of Present Illness:    Vincent Carter is a 73 y.o. male seen today for follow up of the following medical problems.   1. CAD  - CABG in 1997, redo in 2006 as described below.  - ejection fraction in 2006 reported at 40-45%. Repeat echo 02/2013 LVEF 45-50%.    -no recent chest pain   2. HTN  -compliant with meds   3. Hyperlipidemia - Statins cause headaches and fatigue, currently not on therapy   4. AAA screen - negatvie [place] 09/2015  5. Chronic systolic HF - admitted 02/3298 with CHF - 03/2018 echo LVEF 40-45% - diuresed over 9L from 168 lbs to 150 lbs  No recent edema. Home weights 137-140 lbs.  - some recent orthostaic symptoms. No SOb or DOE - compliant with meds  6. Afib - new diagnosis during 03/2018 admission - started on eliquis 5mg  bid - on coreg for rate control  - No recent palpitations - no bleeding on coumadin. He reports side effects on eliquis with vision changes and muscle aches,  resolved   7. NSVT - multiple episodes during 03/2018 admission, up to 20 beats - started on oral amio, plans to repeat ischemic testing once COVID-19 restrictions lightened  - no recent palpitations   The patient does not have symptoms concerning for COVID-19 infection (fever, chills, cough, or new shortness of breath).    Past Medical History:  Diagnosis Date   Arthritis    ASCVD (arteriosclerotic cardiovascular disease) cardiologist--  is now Vincent Carter (was Vincent Vincent Carter)    CABG in 1997 -- LIMA to LAD;  cath 07-17-2012 PCI and BMStenting;   RE-DO CABG X4  in  07-24-2004 three-vessel disease; cath in 08-24-2004 post CABG ef 40-45% w/ anteroapical hypokinesis; occlusion SVG  to  RI; patent RIMA to LAD; unsuccessful PCI in totally occluded RI graft.;  last myoview 09-09-2013, no ischemia, moderate inferior wall defect, ef 49%   Chronic obstructive pulmonary disease (Vincent Carter)    Diverticulosis of colon    Euthyroid goiter    with low TSH    History of colon polyps    hyperplastic 2011   History of diverticulitis    History of kidney stones    History of ST elevation myocardial infarction (STEMI)    07-17-2004   w/ extensive RV infact per cath report   HOH (hard of hearing)    no hearing aid   Hypertension    Low TSH level    Clinically euthyroid   Peripheral vascular disease (  Vincent Carter)    Decreased distal pulses and abnormal Doppler   Prostate cancer Vincent Carter) urologist--  Vincent Vincent Carter   T1c, Gleason 3+4=7,  PSA 4.71,  vol 38cc   Ptosis    Left, secondary to injury   S/p bare metal coronary artery stent    x3  to RCA  07-17-2004   Past Surgical History:  Procedure Laterality Date   [place]  08-24-2004  Vincent Carter   post re-do CABG/  Unsuccessful attempt PCI of clotted SVG to RI/  anteroapical segment severely hypokinetic,  ef 40-45%/  fresh occlusion SVG to RI,  continued patency of SVG to PAD and RIMA to LAD /   dLM 80%     CARDIOVASCULAR STRESS TEST  09-09-2013  Vincent Vincent Carter   no reversible ischemia,  moderately large defect noted in the inferior wall extending from  mid ventricle to apex also involves true ventricle apex and anteroapical wall/  hypokinesis of inferior wall in region of prior infarct/scarring/  LVEF 49%   CATARACT EXTRACTION W/ INTRAOCULAR LENS  IMPLANT, BILATERAL  2006   COLONOSCOPY W/ POLYPECTOMY  12-26-2009   CORONARY ANGIOPLASTY WITH STENT PLACEMENT  07-17-2004   Vincent Carter   PCI w/ BMS x3 to RCA and balloon pump placement/  LVSF after revascularization , ef 65% w/ mild inferior hypokinesis/  dLM  70% extending into origins of LAD (70% ostial, 80% mid, D1 Vincent Carter 90% ostial)  CFX (80% ostial OM, 70% after first Vincent Carter) /   99% mRCA stented and no residual;  remains 80% origin of posterior LV Vincent Carter/  most apical portion of PDA was embolized    CORONARY ARTERY BYPASS GRAFT  1997   Vincent Carter   LIMA to LAD   CORONARY ARTERY BYPASS GRAFT  07-24-2004  Vincent Carter   RIMA to LAD,  SVG to OM,  SVG to PDA,  SVG to RI   EYE SURGERY Left 1996   repair ocular injury   ORIF RIGHT TIB-FIB FX  1990's   PROSTATE BIOPSY     RADIOACTIVE SEED IMPLANT N/A 05/02/2014   Procedure: RADIOACTIVE SEED IMPLANT;  Surgeon: Vincent Seal, MD;  Location: [facility];  Service: Urology;  Laterality: N/A;   TONSILLECTOMY  age 73   TOTAL HIP ARTHROPLASTY Left 1983   RECONSTRUCTION/ THA/  SKIN AND BONE GRAFTS--  pt fell from 5 stories off [place]   TRANSTHORACIC ECHOCARDIOGRAM  [id number]   diastolic dysfunction/  ef 45-50%/  mild AV calcification without stenosis,  mild AR/  mild LAE and RAE/  trivial MR and TR       No outpatient medications have been marked as taking for the 06/08/18 encounter (Appointment) with Vincent Lenis, MD.     Allergies:   Codeine and Statins   Social History   Tobacco Use   Smoking status: Current Every Day Smoker    Packs/day: 1.00     Years: 55.00    Pack years: 55.00    Types: Cigarettes    Start date: 08/13/1958   Smokeless tobacco: Never Used  Substance Use Topics   Alcohol use: No    Alcohol/week: 0.0 standard drinks   Drug use: No     Family Hx: The patient's family history includes Cancer in his brother; Clotting disorder in his mother; Heart attack in his mother; Lung cancer in his father.  ROS:   Please see the history of present illness.  All other systems reviewed and are negative.   Prior CV studies:   The following studies were reviewed today:  02/2013 Echo Study Conclusions  - Study data: Technically difficult study. - Left ventricle: The cavity size was normal. Wall thickness was normal. Systolic function waslow normal tomildly reduced. The estimated ejection fraction was in the range of 45% to 50%. - Aortic valve: Mildly calcified annulus. Trileaflet; mildly thickened leaflets. Mild regurgitation. Valve area: 2.53cm^2(VTI). Valve area: 2.42cm^2 (Vmax). - Mitral valve: Mildly calcified annulus. Mildly thickened leaflets . - Left atrium: The atrium was mildly dilated. - Right ventricle: The cavity size was mildly dilated. - Right atrium: The atrium was mildly dilated. - Atrial septum: No defect or patent foramen ovale was identified.  Labs/Other Tests and Data Reviewed:    EKG:  na  Recent Labs: 03/24/2018: B Natriuretic Peptide 545.0; Hemoglobin 17.0; Platelets 152 03/25/2018: ALT 20; TSH 0.594 03/29/2018: Magnesium 2.4 03/30/2018: BUN 42; Creatinine, Ser 1.30; Potassium 5.2; Sodium 134   Recent Lipid Panel Lab Results  Component Value Date/Time   CHOL 97 03/24/2018 12:35 PM   TRIG 92 03/24/2018 12:35 PM   HDL 31 (L) 03/24/2018 12:35 PM   CHOLHDL 3.1 03/24/2018 12:35 PM   LDLCALC 48 03/24/2018 12:35 PM    Wt Readings from Last 3 Encounters:  03/30/18 150 lb 5.7 oz (68.2 kg)  10/01/16 154 lb (69.9 kg)  09/16/15 155 lb (70.3 kg)     Objective:    Vital Signs:    Today's Vitals   06/08/18 1256  BP: 140/68  Pulse: 74  Weight: 139 lb (63 kg)  Height: 5\' 2"  (1.575 m)   Body mass index is 25.42 kg/m.  Normal affect. Well nourished eldely male sitting comfotable in no distress. No audible or visual signs of SOB or wheezing. Normal speech pattern and tone.   ASSESSMENT & PLAN:    1. CAD/ICM/Chronic systolic HF - has done well since discharge. Some orthostatic symptoms at home, low lasix to 20mg  alternating days with 40mg .  - recheck BMET/Mg on diuretic - due to NSVT and sligh decrease in LVEF obtain lexiscan  2. HTN  - continue current meds  3. Afib - no symptoms - side effects on eliquis, tolerating coumadin without troubles - see if by stress test EKG he is still in afib, may consider trial of cardioversion in the future  4. NSVT -noted during recent admission - started on amio but appears only prescirbed for 2 weeks - obtain lexiscan MPI. He is asymptomatic, continue beta blocker. Would not restart amio at this time     COVID-19 Education: The signs and symptoms of COVID-19 were discussed with the patient and how to seek care for testing (follow up with PCP or arrange E-visit).  The importance of social distancing was discussed today.  Time:   Today, I have spent 22 minutes with the patient with telehealth technology discussing the above problems.     Medication Adjustments/Labs and Tests Ordered: Current medicines are reviewed at length with the patient today.  Concerns regarding medicines are outlined above.   Tests Ordered: No orders of the defined types were placed in this encounter.   Medication Changes: No orders of the defined types were placed in this encounter.   Disposition:  Follow up 2 months  Signed, Vincent Dolly, MD  06/08/2018 9:35 AM    [place]

## 2018-06-08 NOTE — Progress Notes (Signed)
Medication Instructions:  TAKE LASIX 40 MG ONE DAY , THEN 20 MG THE NEXT   Labwork: NONE  Testing/Procedures: NONE  Follow-Up: Your physician recommends that you schedule a follow-up appointment in: 2 MONTHS    Any Other Special Instructions Will Be Listed Below (If Applicable).     If you need a refill on your cardiac medications before your next appointment, please call your pharmacy.

## 2018-06-08 NOTE — Addendum Note (Signed)
Addended by: Debbora Lacrosse R on: 06/08/2018 04:09 PM   Modules accepted: Orders

## 2018-06-09 ENCOUNTER — Ambulatory Visit: Payer: Self-pay | Admitting: Pharmacist

## 2018-06-09 ENCOUNTER — Other Ambulatory Visit: Payer: Self-pay | Admitting: Pharmacist

## 2018-06-09 ENCOUNTER — Other Ambulatory Visit: Payer: Self-pay

## 2018-06-09 NOTE — Patient Outreach (Signed)
[place] [facility]) Care Management  06/09/2018  Vincent Carter 1945/03/31 [id number]   Successful outreach with Vincent Carter. No complaints of pain or shortness of breath. No complaints of palpitations or chest discomfort. He requested that I speak with Vincent Carter to discuss updates.  Per Vincent Carter, he is doing well. Completed his scheduled telehealth visit with Vincent Carter on yesterday. Lasix dose was adjusted. Vincent Carter reports that he is taking medications as prescribed and following up with the coumadin clinic as scheduled. He remains compliant with daily weights.  Weight-140.8lbs  Vincent Carter denies changes in care management needs. Encouraged to notify RNCM if needed prior to next scheduled outreach.  PLAN Will update [facility] Pharmacist regarding medication adjustments. Will continue routine outreach with Vincent Carter.  [place] [phone]

## 2018-06-11 NOTE — Patient Outreach (Signed)
Vincent Carter) Care Management  [place]  06/11/2018  Vincent Carter [birth year] [id number]   Reason for referral: Medication Management  Referral source: [facility] RN Current insurance: [facility]  Reason for call: compliance packs/medication review  Outreach:  Unsuccessful telephone call attempt #1 to patient's daughter   HIPAA compliant voicemail left requesting a return call  Plan:  -I will make another outreach attempt to patient within 3-4 business days.    Regina Eck, PharmD, [place]  [phone]

## 2018-06-12 ENCOUNTER — Ambulatory Visit: Payer: Self-pay

## 2018-06-13 ENCOUNTER — Ambulatory Visit: Payer: Self-pay | Admitting: Pharmacist

## 2018-06-13 ENCOUNTER — Other Ambulatory Visit: Payer: Self-pay | Admitting: Pharmacist

## 2018-06-13 NOTE — Patient Outreach (Signed)
[place] [facility]) Care Management  [name]  06/13/2018  Vincent Carter [birth year] [id number]   Reason for referral: Medication Management  Referral source: [facility] RN Current insurance: [facility]  Reason for call: compliance packs  Outreach:  Unsuccessful telephone call attempt #2 to patient.   HIPAA compliant voicemail left requesting a return call  Plan:  -I will make another outreach attempt to patient within 3-4 business days.    Regina Eck, PharmD, [place]  [phone]

## 2018-06-14 ENCOUNTER — Ambulatory Visit: Payer: Medicare Other | Admitting: Cardiology

## 2018-06-14 ENCOUNTER — Telehealth: Payer: Self-pay | Admitting: *Deleted

## 2018-06-14 NOTE — Telephone Encounter (Signed)

## 2018-06-15 ENCOUNTER — Ambulatory Visit (INDEPENDENT_AMBULATORY_CARE_PROVIDER_SITE_OTHER): Payer: Medicare Other | Admitting: *Deleted

## 2018-06-15 DIAGNOSIS — I4891 Unspecified atrial fibrillation: Secondary | ICD-10-CM

## 2018-06-15 DIAGNOSIS — Z5181 Encounter for therapeutic drug level monitoring: Secondary | ICD-10-CM | POA: Diagnosis not present

## 2018-06-15 LAB — POCT INR: INR: 2.8 (ref 2.0–3.0)

## 2018-06-15 NOTE — Patient Instructions (Signed)
Continue coumadin 1/2 tablet daily.  Recheck in 2 wks. Call [phone] for any questions or medication changes

## 2018-06-16 ENCOUNTER — Ambulatory Visit: Payer: Self-pay | Admitting: Pharmacist

## 2018-06-20 ENCOUNTER — Ambulatory Visit: Payer: Self-pay | Admitting: Pharmacist

## 2018-06-20 ENCOUNTER — Other Ambulatory Visit: Payer: Self-pay | Admitting: Pharmacist

## 2018-06-20 ENCOUNTER — Telehealth: Payer: Self-pay | Admitting: Cardiology

## 2018-06-20 NOTE — Telephone Encounter (Signed)
Received call from [place] -they need clarification on patient's medications. Lasix & coumdin .

## 2018-06-21 MED ORDER — FUROSEMIDE 20 MG PO TABS: ORAL_TABLET | ORAL | 3 refills | Status: DC

## 2018-06-21 MED ORDER — WARFARIN SODIUM 5 MG PO TABS: ORAL_TABLET | ORAL | 3 refills | Status: DC

## 2018-06-21 NOTE — Telephone Encounter (Signed)
New Rx for warfarin sent to [facility]

## 2018-06-21 NOTE — Telephone Encounter (Signed)
Received telephone call in regards to patient's prescriptions of Lasix & Coumdin.  [place] needs to have written new scripts on these 2 medications . A pharmacists from [facility] is working with patient and [place] to help coordinate these medications.   [phone]) pharmacist # . Please send new scripts to [facility].

## 2018-06-21 NOTE — Telephone Encounter (Signed)
I have sent in a new RX for lasix. I cannot do the one for coumadin. I will forward to Edrick Oh, RN to take care of the coumadin RX.

## 2018-06-23 ENCOUNTER — Ambulatory Visit: Payer: Medicare Other | Admitting: Urology

## 2018-06-26 ENCOUNTER — Other Ambulatory Visit: Payer: Self-pay

## 2018-06-26 ENCOUNTER — Other Ambulatory Visit: Payer: Self-pay | Admitting: *Deleted

## 2018-06-26 MED ORDER — FUROSEMIDE 20 MG PO TABS: ORAL_TABLET | ORAL | 3 refills | Status: DC

## 2018-06-26 NOTE — Patient Outreach (Signed)
[place] [facility]) Care Management [place]  06/26/2018  Vincent Carter 12/17/1945 [id number]  Reason for referral: medication management  Care coordination call to [place] requesting new RXs for lasix and warfarin as patient's doses have changed.  Per chart, new RXs have been sent to [place] to initiate compliance pill packaging.  Call placed to [place] to confirm receipt of new RXs.    PLAN: -Next INR check appt is scheduled for 07/03/18. Will wait until INR results to determine if warfarin dose change.  Will plan to start compliance packs on 07/03/18.  Daughter and pharmacy both notified of care plan.   Regina Eck, PharmD, [place]  [phone]

## 2018-06-26 NOTE — Patient Outreach (Signed)
[place] [facility]) Care Management  06/26/2018  HERSH MINNEY 03-31-1945 [id number]   Attempted outreach with Mr. Ricke. Reports doing well but busy at the time of my call. Agreeable to outreach later this week.  PLAN -Will follow-up this week.   [place] [phone]

## 2018-06-30 ENCOUNTER — Other Ambulatory Visit: Payer: Self-pay

## 2018-06-30 NOTE — Patient Outreach (Signed)
[place] [facility]) Care Management  06/30/2018  [name] [birth year] [id number]     Attempted follow-up outreach. Vincent Carter was not available. Left HIPAA compliant voice message requesting a return call.    PLAN -Will attempt outreach next week.  [place] [phone]

## 2018-07-03 ENCOUNTER — Ambulatory Visit (INDEPENDENT_AMBULATORY_CARE_PROVIDER_SITE_OTHER): Payer: Medicare Other | Admitting: *Deleted

## 2018-07-03 ENCOUNTER — Ambulatory Visit: Payer: Self-pay | Admitting: Pharmacist

## 2018-07-03 DIAGNOSIS — I4891 Unspecified atrial fibrillation: Secondary | ICD-10-CM | POA: Diagnosis not present

## 2018-07-03 DIAGNOSIS — Z5181 Encounter for therapeutic drug level monitoring: Secondary | ICD-10-CM | POA: Diagnosis not present

## 2018-07-03 LAB — POCT INR: INR: 1.7 — AB (ref 2.0–3.0)

## 2018-07-03 NOTE — Patient Instructions (Signed)
Increase coumadin to 1/2 tablet daily except 1 tablet on Mondays.  Recheck in 3 wks. Call [phone] for any questions or medication changes

## 2018-07-04 ENCOUNTER — Ambulatory Visit: Payer: Self-pay | Admitting: Pharmacist

## 2018-07-05 ENCOUNTER — Other Ambulatory Visit: Payer: Self-pay | Admitting: Pharmacist

## 2018-07-05 ENCOUNTER — Telehealth: Payer: Self-pay | Admitting: *Deleted

## 2018-07-05 ENCOUNTER — Other Ambulatory Visit: Payer: Self-pay

## 2018-07-05 MED ORDER — WARFARIN SODIUM 2.5 MG PO TABS: ORAL_TABLET | ORAL | 6 refills | Status: DC

## 2018-07-05 NOTE — Patient Outreach (Addendum)
[place] [facility]) Care Management [place]  07/05/2018  MADISON ALBEA 1945/09/25 [id number]  Reason for referral: medication management  Care coordination call to [place] requesting new RXs for warfarin as patient's dose has changed (INR 1.7).  Spoke with Edrick Oh, RN who confirmed she would be sending new RX for warfarin.  Per chart, new RXs have been sent to [place] to initiate compliance pill packaging.  Call placed to [place] to confirm receipt of new RXs.  Will start pill packaging this week until 07/25/2018. Also, awaiting RX for coreg to be faxed in.    PLAN: -Next INR check appt is scheduled for 07/25/18.   Regina Eck, PharmD, [place]  [phone]

## 2018-07-05 NOTE — Patient Outreach (Signed)
[place] [facility]) Care Management  07/05/2018  Vincent Carter 1945-12-12 [id number]  Successful outreach with Vincent Carter. He reports doing well and continues to progress with his care management goals. Denies complaints of shortness of breath, chest pain or palpitations. Denies edema to his lower extremities. Reports taking medications as prescribed. Reports compliance with adjusting warfarin doses as instructed. He continues to ambulate well. Performs ADLs independently.   Morning weight=140.4 lbs.   Vincent Carter denies changes in care management needs. Encouraged to contact RNCM if needed prior to next scheduled outreach.  PLAN -Will follow-up next month.   [place] [phone]

## 2018-07-05 NOTE — Telephone Encounter (Signed)
Family would like for [place] to pill pack warfarin.  Will send in new Rx from INR visit on Monday 6/22 - next visit on 7/14.  Will have to send new Rx after every visit.

## 2018-07-07 ENCOUNTER — Other Ambulatory Visit: Payer: Self-pay | Admitting: Pharmacist

## 2018-07-07 ENCOUNTER — Other Ambulatory Visit: Payer: Self-pay

## 2018-07-07 MED ORDER — CARVEDILOL 12.5 MG PO TABS: 18.7500 mg | ORAL_TABLET | Freq: Two times a day (BID) | ORAL | 6 refills | Status: DC

## 2018-07-07 NOTE — Telephone Encounter (Signed)
Refilled coreg to [facility]

## 2018-07-07 NOTE — Patient Outreach (Signed)
[place] [facility]) Care Management  [place]  07/07/2018  Vincent Carter [birth year] [id number]   Reason for referral: compliance packaging  Successful call to [facility] requesting new RX for coreg.  Latest RXs were written by hospitalist with no refills from last hospitalization.  Refills requested by Dr. Harl Bowie (cardiologist).  Pill packs will be ready on Monday.  Patient has enough medication to last until next week.  Patient's daughter, [name] will pick up at the pharmacy [facility]).  Daughter encouraged to call me if there are issues.    Plan:  -I will follow up next month to ensure pill packs are working well with patient   Regina Eck, PharmD, [place]  [phone]

## 2018-07-18 ENCOUNTER — Other Ambulatory Visit: Payer: Self-pay | Admitting: Pharmacist

## 2018-07-18 NOTE — Patient Outreach (Addendum)
[place] [facility]) Care Management [place]  07/18/2018  FREDERICH MONTILLA 1945-06-16 [id number]  Reason for referral: medication management  [facility] pharmacy case is being closed due to the following reasons:  Goals have been met.  Successful call to patient's daughter, [name], who states patient is doing well.  Pill packages have not been picked up yet per pharmacy.  Encouraged patient and daughter to please call and have delivered or pick up.  [name] called back to state her dad was on the way to pick them up.   I will sign off as patient has been transitioned to pill packs.  Encouraged family to call in the future if needed.  Patient has been provided [facility] CM contact information if assistance needed in the future.    Thank you for allowing [facility] pharmacy to be involved in this patient's care.    Regina Eck, PharmD, [place]  [phone]

## 2018-07-25 ENCOUNTER — Ambulatory Visit (INDEPENDENT_AMBULATORY_CARE_PROVIDER_SITE_OTHER): Payer: Medicare Other | Admitting: *Deleted

## 2018-07-25 ENCOUNTER — Ambulatory Visit: Payer: Self-pay | Admitting: Pharmacist

## 2018-07-25 DIAGNOSIS — I4891 Unspecified atrial fibrillation: Secondary | ICD-10-CM | POA: Diagnosis not present

## 2018-07-25 DIAGNOSIS — Z5181 Encounter for therapeutic drug level monitoring: Secondary | ICD-10-CM

## 2018-07-25 LAB — POCT INR: INR: 2.5 (ref 2.0–3.0)

## 2018-07-25 MED ORDER — WARFARIN SODIUM 2.5 MG PO TABS: ORAL_TABLET | ORAL | 6 refills | Status: DC

## 2018-07-25 NOTE — Patient Instructions (Signed)
[facility] bubble packing:  Using Warfarin 2.5mg  tablet now Continue coumadin 1 tablet daily except 2 tablets on Mondays.  Recheck in 3 wks. Call [phone] for any questions or medication changes

## 2018-07-27 ENCOUNTER — Ambulatory Visit: Payer: Self-pay | Admitting: Pharmacist

## 2018-07-28 ENCOUNTER — Other Ambulatory Visit: Payer: Self-pay

## 2018-07-28 NOTE — Patient Outreach (Signed)
[place] [facility]) Care Management  07/28/2018  HELEN CUFF Oct 05, 1945 [id number]    Brief outreach with Mr. Schrager. Reports doing very well since our conversation last month. He experiences episodes of exertional dyspnea but overall states that he is doing very well. He has not required use of supplemental oxygen. He has transitioned to compliance packages and reports taking medications as prescribed.   Morning weight-143 lbs.  Mr. Loh is compliant with medications and treatment recommendations. Denies changes in care management needs. Will continue routine outreach.  PLAN -Will follow-up next month.  [place] [phone]

## 2018-08-09 ENCOUNTER — Encounter: Payer: Self-pay | Admitting: Cardiology

## 2018-08-09 ENCOUNTER — Other Ambulatory Visit: Payer: Self-pay

## 2018-08-09 ENCOUNTER — Ambulatory Visit (INDEPENDENT_AMBULATORY_CARE_PROVIDER_SITE_OTHER): Payer: Medicare Other | Admitting: Cardiology

## 2018-08-09 VITALS — BP 130/83 | HR 71 | Temp 97.5°F | Ht 62.0 in | Wt 149.0 lb

## 2018-08-09 DIAGNOSIS — I255 Ischemic cardiomyopathy: Secondary | ICD-10-CM | POA: Diagnosis not present

## 2018-08-09 DIAGNOSIS — I10 Essential (primary) hypertension: Secondary | ICD-10-CM

## 2018-08-09 DIAGNOSIS — I5022 Chronic systolic (congestive) heart failure: Secondary | ICD-10-CM | POA: Diagnosis not present

## 2018-08-09 DIAGNOSIS — I472 Ventricular tachycardia: Secondary | ICD-10-CM | POA: Diagnosis not present

## 2018-08-09 DIAGNOSIS — I2510 Atherosclerotic heart disease of native coronary artery without angina pectoris: Secondary | ICD-10-CM

## 2018-08-09 DIAGNOSIS — I4891 Unspecified atrial fibrillation: Secondary | ICD-10-CM

## 2018-08-09 DIAGNOSIS — I4729 Other ventricular tachycardia: Secondary | ICD-10-CM

## 2018-08-09 NOTE — Patient Instructions (Signed)
Medication Instructions: Your physician recommends that you continue on your current medications as directed. Please refer to the Current Medication list given to you today.Vincent Carter: None  Procedures/Testing: Your physician has requested that you have a lexiscan myoview. For further information please visit [email or web address]. Please follow instruction sheet, as given.    Follow-Up: 4 months with Dr.Branch  Any Additional Special Instructions Will Be Listed Below (If Applicable).     If you need a refill on your cardiac medications before your next appointment, please call your pharmacy.     Thank you for choosing [facility] !

## 2018-08-09 NOTE — Progress Notes (Signed)
Clinical Summary Vincent Carter is a 73 y.o.male seen today for follow up of the following medical problems.   1. CAD  - CABG in 1997, redo in 2006 as described below.  - ejection fraction in 2006 reported at 40-45%. Repeat echo 02/2013 LVEF 45-50%.    - no recent chest pain - compliant with meds   2. HTN  -he is compliant with meds  3. Hyperlipidemia - Statins cause headaches and fatigue, currently not on therapy  03/2018 TC 97 TG 92 HDL 31 LDL 48    4. AAA screen - negatvieUS9/2017  5. Chronic systolic HF - admitted 0/8657 with CHF - 03/2018 echo LVEF 40-45% - diuresed over 9L from 168 lbs to 150 lbs   - home weights 142 lbs.  - no recent edema. No orthopnea, SOB or DOE   6. Afib - new diagnosis during 03/2018 admission  - He reports side effects on eliquis with vision changes and muscle aches, resolved  - no recent palpitations - no bleeding on coumadin.    7. NSVT - multiple episodes during 03/2018 admission, up to 20 beats - started on oral amio, plans to repeat ischemic testing once COVID-19 restrictions lightened  Only given 2 week course of amio at discharge, now off - no recent palpitations.     8. Rash - recent diffuse rash, pruritic.he reports it started after hospital discharge in 03/2018 - from chart review new meds during that admission were eliquis (changed to coumadin later on), amiodarone (off after 2 weeks), lasix.    Past Medical History:  Diagnosis Date  . Arthritis   . ASCVD (arteriosclerotic cardiovascular disease) cardiologist--  is now dr Roderic Palau Emitt Maglione (was dr [name])    CABG in 1997 -- LIMA to LAD;  cath 07-17-2012 PCI and BMStenting;   RE-DO CABG X4  in  07-24-2004 three-vessel disease; cath in 08-24-2004 post CABG ef 40-45% w/ anteroapical hypokinesis; occlusion SVG  to  RI; patent RIMA to LAD; unsuccessful PCI in totally occluded RI graft.;  last myoview 09-09-2013, no ischemia, moderate inferior wall  defect, ef 49%  . Chronic obstructive pulmonary disease ([place])   . Diverticulosis of colon   . Euthyroid goiter    with low TSH   . History of colon polyps    hyperplastic 2011  . History of diverticulitis   . History of kidney stones   . History of ST elevation myocardial infarction (STEMI)    07-17-2004   w/ extensive RV infact per cath report  . HOH (hard of hearing)    no hearing aid  . Hypertension   . Low TSH level    Clinically euthyroid  . Peripheral vascular disease (HCC)    Decreased distal pulses and abnormal Doppler  . Prostate cancer [facility]) urologist--  dr Jenny Reichmann wrenn   T1c, Gleason 3+4=7,  PSA 4.71,  vol 38cc  . Ptosis    Left, secondary to injury  . S/p bare metal coronary artery stent    x3  to RCA  07-17-2004     Allergies  Allergen Reactions  . Codeine Nausea Only    Pill form of codeine causes nausea  . Statins Other (See Comments)    Myalgia      Current Outpatient Medications  Medication Sig Dispense Refill  . albuterol (PROVENTIL) (2.5 MG/3ML) 0.083% nebulizer solution Take 3 mLs (2.5 mg total) by nebulization every 4 (four) hours as needed for shortness of breath. 75 mL 12  . budesonide-formoterol (  SYMBICORT) 80-4.5 MCG/ACT inhaler Inhale 2 puffs into the lungs 2 (two) times daily. 1 Inhaler 12  . carvedilol (COREG) 12.5 MG tablet Take 1.5 tablets (18.75 mg total) by mouth 2 (two) times daily. 90 tablet 6  . dextromethorphan-guaiFENesin (MUCINEX DM) 30-600 MG 12hr tablet Take 1 tablet by mouth 2 (two) times daily. 30 tablet 0  . fluticasone (FLONASE) 50 MCG/ACT nasal spray Place 1 spray into both nostrils 2 (two) times daily.    . furosemide (LASIX) 20 MG tablet Take 40 mg alternating with 20 mg dialy 135 tablet 3  . lisinopril (ZESTRIL) 2.5 MG tablet TAKE ONE TABLET BY MOUTH DAILY. 90 tablet 0  . Nebulizer MISC Please provide 1 nebulizer machine and all supplies. Diagnosis: J44.1, Office number: [phone] 1 each 0  . nitroGLYCERIN (NITROSTAT) 0.4  MG SL tablet Place 1 tablet (0.4 mg total) under the tongue every 5 (five) minutes as needed for chest pain. 25 tablet 3  . Omega-3 Fatty Acids (FISH OIL) 300 MG CAPS Take 2 capsules by mouth 2 (two) times daily.      Marland Kitchen warfarin (COUMADIN) 2.5 MG tablet Take 1 tablet daily except 2 tablets on Mondays or as directed  [place] to package for patient 25 tablet 6   No current facility-administered medications for this visit.      Past Surgical History:  Procedure Laterality Date  . APPENDECTOMY  1973  . CARDIAC CATHETERIZATION  08-24-2004  dr Lia Foyer   post re-do CABG/  Unsuccessful attempt PCI of clotted SVG to RI/  anteroapical segment severely hypokinetic,  ef 40-45%/  fresh occlusion SVG to RI,  continued patency of SVG to PAD and RIMA to LAD /   dLM 80%  . CARDIOVASCULAR STRESS TEST  09-09-2013  dr Roderic Palau Erubiel Manasco   no reversible ischemia,  moderately large defect noted in the inferior wall extending from  mid ventricle to apex also involves true ventricle apex and anteroapical wall/  hypokinesis of inferior wall in region of prior infarct/scarring/  LVEF 49%  . CATARACT EXTRACTION W/ INTRAOCULAR LENS  IMPLANT, BILATERAL  2006  . COLONOSCOPY W/ POLYPECTOMY  12-26-2009  . CORONARY ANGIOPLASTY WITH STENT PLACEMENT  07-17-2004   dr Sabino Snipes   PCI w/ BMS x3 to RCA and balloon pump placement/  LVSF after revascularization , ef 65% w/ mild inferior hypokinesis/  dLM  70% extending into origins of LAD (70% ostial, 80% mid, D1 Dravyn Severs 90% ostial)  CFX (80% ostial OM, 70% after first Tahra Hitzeman) /   99% mRCA stented and no residual;  remains 80% origin of posterior LV Gaven Eugene/  most apical portion of PDA was embolized   . CORONARY ARTERY BYPASS GRAFT  1997   dr Merleen Nicely   LIMA to LAD  . CORONARY ARTERY BYPASS GRAFT  07-24-2004  dr gerhardt   RIMA to LAD,  SVG to OM,  SVG to PDA,  SVG to RI  . EYE SURGERY Left 1996   repair ocular injury  . ORIF RIGHT TIB-FIB FX  1990's  . PROSTATE  BIOPSY    . RADIOACTIVE SEED IMPLANT N/A 05/02/2014   Procedure: RADIOACTIVE SEED IMPLANT;  Surgeon: Irine Seal, MD;  Location: [facility];  Service: Urology;  Laterality: N/A;  . TONSILLECTOMY  age 25  . TOTAL HIP ARTHROPLASTY Left 1983   RECONSTRUCTION/ THA/  SKIN AND BONE GRAFTS--  pt fell from 5 stories off [place]  . TRANSTHORACIC ECHOCARDIOGRAM  [id number]   diastolic dysfunction/  ef  45-50%/  mild AV calcification without stenosis,  mild AR/  mild LAE and RAE/  trivial MR and TR       Allergies  Allergen Reactions  . Codeine Nausea Only    Pill form of codeine causes nausea  . Statins Other (See Comments)    Myalgia       Family History  Problem Relation Age of Onset  . Clotting disorder Mother   . Heart attack Mother   . Lung cancer Father   . Cancer Brother        prostate     Social History Mr. Podgorski reports that he has been smoking cigarettes. He started smoking about 60 years ago. He has a 55.00 pack-year smoking history. He has never used smokeless tobacco. Mr. Ciavarella reports no history of alcohol use.   Review of Systems CONSTITUTIONAL: No weight loss, fever, chills, weakness or fatigue.  HEENT: Eyes: No visual loss, blurred vision, double vision or yellow sclerae.No hearing loss, sneezing, congestion, runny nose or sore throat.  SKIN: +itch, +rash CARDIOVASCULAR: perhpi RESPIRATORY: No shortness of breath, cough or sputum.  GASTROINTESTINAL: No anorexia, nausea, vomiting or diarrhea. No abdominal pain or blood.  GENITOURINARY: No burning on urination, no polyuria NEUROLOGICAL: No headache, dizziness, syncope, paralysis, ataxia, numbness or tingling in the extremities. No change in bowel or bladder control.  MUSCULOSKELETAL: No muscle, back pain, joint pain or stiffness.  LYMPHATICS: No enlarged nodes. No history of splenectomy.  PSYCHIATRIC: No history of depression or anxiety.  ENDOCRINOLOGIC: No reports of sweating, cold or heat  intolerance. No polyuria or polydipsia.  Marland Kitchen   Physical Examination Today's Vitals   08/09/18 1407  BP: 130/83  Pulse: 71  Temp: (!) 97.5 F (36.4 C)  Weight: 149 lb (67.6 kg)  Height: 5\' 2"  (1.575 m)   Body mass index is 27.25 kg/m.  Gen: resting comfortably, no acute distress HEENT: no scleral icterus, pupils equal round and reactive, no palptable cervical adenopathy,  CV: RRR, no m/r/g,n o jvd Resp: Clear to auscultation bilaterally GI: abdomen is soft, non-tender, non-distended, normal bowel sounds, no hepatosplenomegaly MSK: extremities are warm, no edema.  Skin: diffuse papular rash Neuro:  no focal deficits Psych: appropriate affect   Diagnostic Studies 02/2013 Echo Study Conclusions  - Study data: Technically difficult study. - Left ventricle: The cavity size was normal. Wall thickness was normal. Systolic function waslow normal tomildly reduced. The estimated ejection fraction was in the range of 45% to 50%. - Aortic valve: Mildly calcified annulus. Trileaflet; mildly thickened leaflets. Mild regurgitation. Valve area: 2.53cm^2(VTI). Valve area: 2.42cm^2 (Vmax). - Mitral valve: Mildly calcified annulus. Mildly thickened leaflets . - Left atrium: The atrium was mildly dilated. - Right ventricle: The cavity size was mildly dilated. - Right atrium: The atrium was mildly dilated. - Atrial septum: No defect or patent foramen ovale was identified.    Assessment and Plan   1. CAD/ICM/Chronic systolic HF - no recent symptoms - continue current meds. Unclear if rash is related to lasix, only new medicine he is on since discharge when rash started. Hold lasix, try chlorthalidone every other day. If ongoing rash unclear if possibly sulfa allergy, would consider ethacrynic acid if insurance can cover  2. HTN  -at goal, continue currentm eds  3. Afib - side effects on eliquis, tolerating coumadin without troubles No symptoms, continue current meds  4. NSVT  -noted during recent admission - started on amio but appears only prescirbed for 2 weeks - plan for  lexiscan to evaluate for underlying ischemia - no recent symptoms     Arnoldo Lenis, M.D.

## 2018-08-11 ENCOUNTER — Telehealth: Payer: Self-pay | Admitting: *Deleted

## 2018-08-11 MED ORDER — CHLORTHALIDONE 25 MG PO TABS: 12.5000 mg | ORAL_TABLET | ORAL | 11 refills | Status: DC

## 2018-08-11 NOTE — Telephone Encounter (Signed)
-----   Message from Arnoldo Lenis, MD sent at 08/11/2018 12:38 PM EDT ----- Can you let patient know I reviewed his hospital records and the only new medication from that admission that he is still on is lasix, unclear if this may be causing his rash. Can he set the lasix aside for now(don't throw out yet), instead start chlorthalidone 12.5mg  every other day. He needs to update [place] on his rash in 2 weeks. Any history of sulfa allergy for him?   Zandra Abts MD

## 2018-08-11 NOTE — Telephone Encounter (Signed)
Daughter informed. Order placed

## 2018-08-15 ENCOUNTER — Encounter (HOSPITAL_COMMUNITY): Payer: Self-pay

## 2018-08-15 ENCOUNTER — Encounter (HOSPITAL_BASED_OUTPATIENT_CLINIC_OR_DEPARTMENT_OTHER)
Admission: RE | Admit: 2018-08-15 | Discharge: 2018-08-15 | Disposition: A | Discharge status: Home / Self Care | Payer: Medicare Other | Source: Ambulatory Visit | Attending: Cardiology | Admitting: Cardiology

## 2018-08-15 ENCOUNTER — Encounter (HOSPITAL_COMMUNITY)
Admission: RE | Admit: 2018-08-15 | Discharge: 2018-08-15 | Disposition: A | Discharge status: Home / Self Care | Payer: Medicare Other | Source: Ambulatory Visit | Attending: Cardiology | Admitting: Cardiology

## 2018-08-15 ENCOUNTER — Other Ambulatory Visit: Payer: Self-pay

## 2018-08-15 ENCOUNTER — Other Ambulatory Visit: Payer: Self-pay | Admitting: Pharmacist

## 2018-08-15 DIAGNOSIS — I252 Old myocardial infarction: Secondary | ICD-10-CM | POA: Insufficient documentation

## 2018-08-15 DIAGNOSIS — I472 Ventricular tachycardia: Secondary | ICD-10-CM | POA: Insufficient documentation

## 2018-08-15 DIAGNOSIS — I259 Chronic ischemic heart disease, unspecified: Secondary | ICD-10-CM | POA: Insufficient documentation

## 2018-08-15 DIAGNOSIS — I4729 Other ventricular tachycardia: Secondary | ICD-10-CM

## 2018-08-15 HISTORY — DX: Heart failure, unspecified: I50.9

## 2018-08-15 LAB — NM MYOCAR MULTI W/SPECT W/WALL MOTION / EF
LV dias vol: 127 mL (ref 62–150)
LV sys vol: 86 mL
Peak HR: 95 {beats}/min
RATE: 0.48
Rest HR: 66 {beats}/min
SDS: 6
SRS: 18
SSS: 24
TID: 1.07

## 2018-08-15 MED ORDER — REGADENOSON 0.4 MG/5ML IV SOLN
INTRAVENOUS | Status: AC
  Administered 2018-08-15: 12:00:00 0.4 mg via INTRAVENOUS
  Filled 2018-08-15: qty 5

## 2018-08-15 MED ORDER — SODIUM CHLORIDE 0.9% FLUSH
INTRAVENOUS | Status: AC
  Administered 2018-08-15: 10 mL via INTRAVENOUS
  Filled 2018-08-15: qty 10

## 2018-08-15 MED ORDER — TECHNETIUM TC 99M TETROFOSMIN IV KIT
10.0000 | PACK | Freq: Once | INTRAVENOUS | Status: AC | PRN
  Administered 2018-08-15: 10:00:00 9.8 via INTRAVENOUS

## 2018-08-15 MED ORDER — TECHNETIUM TC 99M TETROFOSMIN IV KIT
30.0000 | PACK | Freq: Once | INTRAVENOUS | Status: AC | PRN
  Administered 2018-08-15: 30.3 via INTRAVENOUS

## 2018-08-17 NOTE — Patient Outreach (Addendum)
[place] [facility]) Care Management [place]  08/17/2018  Vincent Carter Jan 18, 1945 [id number]  Per cardiology notes, patient with recent diffuse rash (pruritic).  Patient reports it started after hospital discharge in 03/2018.  Unclear if rash is directly related to lasix, however lasix is being held and switched to chlorthalidone every other day.  Cardiology to consider ethacrynic acid if rash continues (potential sulfa allergy) & insurance will cover.  Recent admission with CHF, LVEF 40-45% at that time.  Of note, patient is not on statin due to headaches and fatigue, LDL 48  Call placed to [place] to have pill packs updated.  Family has dropped them off for this purpose.  PLAN: -I will sign off at this time.  Family instructed to bring packs back to pharmacy each time medicines are changed.   Regina Eck, PharmD, [place]  [phone]

## 2018-08-21 ENCOUNTER — Other Ambulatory Visit: Payer: Self-pay

## 2018-08-21 NOTE — Patient Outreach (Signed)
[facility]) Care Management  08/21/2018  Vincent Carter February 20, 1945 [id number]   Current Outpatient Medications on File Prior to Visit  Medication Sig Dispense Refill  . albuterol (PROVENTIL) (2.5 MG/3ML) 0.083% nebulizer solution Take 3 mLs (2.5 mg total) by nebulization every 4 (four) hours as needed for shortness of breath. 75 mL 12  . budesonide-formoterol (SYMBICORT) 80-4.5 MCG/ACT inhaler Inhale 2 puffs into the lungs 2 (two) times daily. 1 Inhaler 12  . carvedilol (COREG) 12.5 MG tablet Take 1.5 tablets (18.75 mg total) by mouth 2 (two) times daily. 90 tablet 6  . chlorthalidone (HYGROTON) 25 MG tablet Take 0.5 tablets (12.5 mg total) by mouth every other day. 15 tablet 11  . dextromethorphan-guaiFENesin (MUCINEX DM) 30-600 MG 12hr tablet Take 1 tablet by mouth 2 (two) times daily. 30 tablet 0  . fluticasone (FLONASE) 50 MCG/ACT nasal spray Place 1 spray into both nostrils 2 (two) times daily.    Marland Kitchen lisinopril (ZESTRIL) 2.5 MG tablet TAKE ONE TABLET BY MOUTH DAILY. 90 tablet 0  . Nebulizer MISC Please provide 1 nebulizer machine and all supplies. Diagnosis: J44.1, Office number: [phone] 1 each 0  . nitroGLYCERIN (NITROSTAT) 0.4 MG SL tablet Place 1 tablet (0.4 mg total) under the tongue every 5 (five) minutes as needed for chest pain. 25 tablet 3  . Omega-3 Fatty Acids (FISH OIL) 300 MG CAPS Take 2 capsules by mouth 2 (two) times daily.      Marland Kitchen warfarin (COUMADIN) 2.5 MG tablet Take 1 tablet daily except 2 tablets on Mondays or as directed  [place] to package for patient 25 tablet 6   No current facility-administered medications on file prior to visit.      THN CM Care Plan Problem One     Most Recent Value  Care Plan Problem One  Risk for Readmission  Role Documenting the Problem One  Care Management [place] for Problem One  Active  THN Long Term Goal   Over the next 60 days patient will not be readmitted for chronic disease  related complications.  THN Long Term Goal Start Date  06/09/18  THN Long Term Goal Met Date  08/21/18  THN CM Short Term Goal #1   Over the next 30 days patient will take all medications as prescribed.  THN CM Short Term Goal #1 Start Date  05/26/18 [Goal Reset]  THN CM Short Term Goal #1 Met Date  07/05/18  THN CM Short Term Goal #2   Over the next 30 days patient will complete required labs and follow up with anticoagulation clinic as scheduled.  THN CM Short Term Goal #2 Start Date  06/09/18  [name] CM Short Term Goal #2 Met Date  07/05/18       PLAN -Will follow-up this week.   [place] [phone]

## 2018-08-22 DIAGNOSIS — J449 Chronic obstructive pulmonary disease, unspecified: Secondary | ICD-10-CM | POA: Diagnosis not present

## 2018-08-22 DIAGNOSIS — I2510 Atherosclerotic heart disease of native coronary artery without angina pectoris: Secondary | ICD-10-CM | POA: Diagnosis not present

## 2018-08-22 DIAGNOSIS — I10 Essential (primary) hypertension: Secondary | ICD-10-CM | POA: Diagnosis not present

## 2018-08-22 DIAGNOSIS — R600 Localized edema: Secondary | ICD-10-CM | POA: Diagnosis not present

## 2018-08-22 DIAGNOSIS — I700 Atherosclerosis of aorta: Secondary | ICD-10-CM | POA: Diagnosis not present

## 2018-08-25 ENCOUNTER — Other Ambulatory Visit: Payer: Self-pay

## 2018-08-25 NOTE — Patient Outreach (Signed)
[facility]) Care Management  08/25/2018  Vincent Carter 1945-01-19 [id number]   Attempted follow-up outreach with Vincent Carter.  Left a voice message requesting a return.  Call placed to his caregiver Vincent Carter to confirm that he was doing well. He was scheduled for a clinic visit with his primary care provider on 08/22/18 regarding a possible sinus infection. Per chart review, he missed his appointment on 08/24/18 with the Anticoagulation Clinic. Vincent Carter reported that he was evaluated by Dr. Gerarda Fraction on 08/22/18. Indicated that some of his medications were changed. She was unaware of the missed appointment on 08/24/18.   PLAN -Will confirm medication changes and update record. -Will follow-up with Vincent Carter on 08/28/18.   [place] [phone]

## 2018-08-28 ENCOUNTER — Other Ambulatory Visit: Payer: Self-pay

## 2018-08-28 NOTE — Patient Outreach (Signed)
[facility]) Care Management  08/28/2018  ANTOLIN BELSITO 02-Oct-1945 [id number]   Follow-up outreach complete. Mr. Hightower diuretic was changed during his visit with Dr. Gerarda Fraction on 08/22/18. Per his caregiver [name], he was noted to have a weight gain of greater than 7lbs in less than 5 days. He was transitioned from chlorthalidone to ethacrynic acid.   He reports good urine output over the weekend. No complaints of shortness of breath at the time of the all. Continues to experience exertional dyspnea which is normal for him. Reports improvement to the previously noted rash.  Current weight-141 lbs Current Outpatient Medications on File Prior to Visit  Medication Sig Dispense Refill  . azithromycin (ZITHROMAX) 250 MG tablet Take 250 mg by mouth daily.    Marland Kitchen ethacrynic acid (EDECRIN) 25 MG tablet Take 25 mg by mouth.    Marland Kitchen albuterol (PROVENTIL) (2.5 MG/3ML) 0.083% nebulizer solution Take 3 mLs (2.5 mg total) by nebulization every 4 (four) hours as needed for shortness of breath. 75 mL 12  . budesonide-formoterol (SYMBICORT) 80-4.5 MCG/ACT inhaler Inhale 2 puffs into the lungs 2 (two) times daily. 1 Inhaler 12  . carvedilol (COREG) 12.5 MG tablet Take 1.5 tablets (18.75 mg total) by mouth 2 (two) times daily. 90 tablet 6  . chlorthalidone (HYGROTON) 25 MG tablet Take 0.5 tablets (12.5 mg total) by mouth every other day. (Patient not taking: Reported on 08/28/2018) 15 tablet 11  . dextromethorphan-guaiFENesin (MUCINEX DM) 30-600 MG 12hr tablet Take 1 tablet by mouth 2 (two) times daily. 30 tablet 0  . fluticasone (FLONASE) 50 MCG/ACT nasal spray Place 1 spray into both nostrils 2 (two) times daily.    Marland Kitchen lisinopril (ZESTRIL) 2.5 MG tablet TAKE ONE TABLET BY MOUTH DAILY. 90 tablet 0  . Nebulizer MISC Please provide 1 nebulizer machine and all supplies. Diagnosis: J44.1, Office number: [phone] 1 each 0  . nitroGLYCERIN (NITROSTAT) 0.4 MG SL tablet Place 1 tablet (0.4 mg total)  under the tongue every 5 (five) minutes as needed for chest pain. 25 tablet 3  . Omega-3 Fatty Acids (FISH OIL) 300 MG CAPS Take 2 capsules by mouth 2 (two) times daily.      Marland Kitchen warfarin (COUMADIN) 2.5 MG tablet Take 1 tablet daily except 2 tablets on Mondays or as directed  [place] to package for patient 25 tablet 6   No current facility-administered medications on file prior to visit.     PLAN -Will continue routine outreach.   [place] Care Management [phone]

## 2018-09-22 ENCOUNTER — Other Ambulatory Visit: Payer: Self-pay

## 2018-09-22 NOTE — Patient Outreach (Signed)
[facility]) Care Management  09/22/2018  VESTON BENI February 15, 1945 [id number]     Brief outreach with Mr. Yeager. He reports doing very well today. Denies current complaints of shortness of breath. No chest pain or palpitations. Denies increased edema to his abdomen or lower extremities. Reports ambulating well. No decline in activity tolerance.  He reports compliance with medications and treatment recommendations. Denies concerns regarding medication side effects.  Morning weight- 143lbs. Current Outpatient Medications on File Prior to Visit  Medication Sig Dispense Refill  . albuterol (PROVENTIL) (2.5 MG/3ML) 0.083% nebulizer solution Take 3 mLs (2.5 mg total) by nebulization every 4 (four) hours as needed for shortness of breath. 75 mL 12  . azithromycin (ZITHROMAX) 250 MG tablet Take 250 mg by mouth daily.    . budesonide-formoterol (SYMBICORT) 80-4.5 MCG/ACT inhaler Inhale 2 puffs into the lungs 2 (two) times daily. 1 Inhaler 12  . carvedilol (COREG) 12.5 MG tablet Take 1.5 tablets (18.75 mg total) by mouth 2 (two) times daily. 90 tablet 6  . chlorthalidone (HYGROTON) 25 MG tablet Take 0.5 tablets (12.5 mg total) by mouth every other day. (Patient not taking: Reported on 08/28/2018) 15 tablet 11  . dextromethorphan-guaiFENesin (MUCINEX DM) 30-600 MG 12hr tablet Take 1 tablet by mouth 2 (two) times daily. 30 tablet 0  . ethacrynic acid (EDECRIN) 25 MG tablet Take 25 mg by mouth.    . fluticasone (FLONASE) 50 MCG/ACT nasal spray Place 1 spray into both nostrils 2 (two) times daily.    Marland Kitchen lisinopril (ZESTRIL) 2.5 MG tablet TAKE ONE TABLET BY MOUTH DAILY. 90 tablet 0  . Nebulizer MISC Please provide 1 nebulizer machine and all supplies. Diagnosis: J44.1, Office number: [phone] 1 each 0  . nitroGLYCERIN (NITROSTAT) 0.4 MG SL tablet Place 1 tablet (0.4 mg total) under the tongue every 5 (five) minutes as needed for chest pain. 25 tablet 3  . Omega-3 Fatty Acids  (FISH OIL) 300 MG CAPS Take 2 capsules by mouth 2 (two) times daily.      Marland Kitchen warfarin (COUMADIN) 2.5 MG tablet Take 1 tablet daily except 2 tablets on Mondays or as directed  [place] to package for patient 25 tablet 6   No current facility-administered medications on file prior to visit.      THN CM Care Plan Problem One     Most Recent Value  Care Plan Problem One  Risk for Complications r/t Chronic Illnesses  Role Documenting the Problem One  Care Management Coordinator  Care Plan for Problem One  Active  THN Long Term Goal   Over the next 90 days, patient will not experience complications d/t chronic illnesses.  THN Long Term Goal Start Date  09/22/18  Interventions for Problem One Long Term Goal  Reviewed medications, weight parameters and compliance with treatment recommendations.  THN CM Short Term Goal #1   Over the next 30 days, patient will attend all MD appointments and follow-up with Anticoagulation clinic as scheduled.  THN CM Short Term Goal #1 Start Date  09/22/18  Interventions for Short Term Goal #1  Reviewed pending appointments and confirmed method of transportation. Patient encouraged to attend all appointments as scheduled to prevent delays in care.  THN CM Short Term Goal #2   Over the next 30 days, patient will continue to take medications as prescribed and immediately notify MD with concerns regarding possible side effects.  THN CM Short Term Goal #2 Start Date  09/22/18  Interventions for Short Term Goal #  2  Reviewed medications and indications for use. Patient reports improved compliance with medication adherence packaging. Encouraged to avoid discontinuing medications without informing MD. Encouraged to notify MD with concerns regarding side effects.      PLAN -Will continue routine outreach.   [place] Care Management [phone]

## 2018-09-25 ENCOUNTER — Ambulatory Visit (INDEPENDENT_AMBULATORY_CARE_PROVIDER_SITE_OTHER): Payer: Medicare Other | Admitting: *Deleted

## 2018-09-25 DIAGNOSIS — I4891 Unspecified atrial fibrillation: Secondary | ICD-10-CM | POA: Diagnosis not present

## 2018-09-25 DIAGNOSIS — Z5181 Encounter for therapeutic drug level monitoring: Secondary | ICD-10-CM

## 2018-09-25 LAB — POCT INR: INR: 2.5 (ref 2.0–3.0)

## 2018-09-25 NOTE — Patient Instructions (Signed)
[facility] bubble packing:  Using Warfarin 2.5mg  tablet now Continue coumadin 1 tablet daily except 2 tablets on Mondays.  Recheck in 4 wks. Call [phone] for any questions or medication changes

## 2018-10-23 ENCOUNTER — Ambulatory Visit (INDEPENDENT_AMBULATORY_CARE_PROVIDER_SITE_OTHER): Payer: Medicare Other | Admitting: *Deleted

## 2018-10-23 ENCOUNTER — Ambulatory Visit: Payer: Self-pay

## 2018-10-23 ENCOUNTER — Other Ambulatory Visit: Payer: Self-pay

## 2018-10-23 DIAGNOSIS — I4891 Unspecified atrial fibrillation: Secondary | ICD-10-CM

## 2018-10-23 DIAGNOSIS — Z5181 Encounter for therapeutic drug level monitoring: Secondary | ICD-10-CM

## 2018-10-23 LAB — POCT INR: INR: 2.1 (ref 2.0–3.0)

## 2018-10-23 NOTE — Patient Instructions (Signed)
[facility] bubble packing:  Using Warfarin 2.5mg  tablet now Continue coumadin 1 tablet daily except 2 tablets on Mondays.  Recheck in 4 wks. Call [phone] for any questions or medication changes

## 2018-10-25 ENCOUNTER — Other Ambulatory Visit: Payer: Self-pay

## 2018-10-25 NOTE — Patient Outreach (Signed)
[facility]) Care Management  10/25/2018  TAEVION HOLTORF November 11, 1945 [id number]   Attempted outreach with Mr. Scallion. Left voice message requesting a return call.    PLAN -Will follow-up within 3 business days.    [place] Care Management [phone]

## 2018-11-09 ENCOUNTER — Other Ambulatory Visit: Payer: Self-pay

## 2018-11-10 DIAGNOSIS — I110 Hypertensive heart disease with heart failure: Secondary | ICD-10-CM | POA: Diagnosis not present

## 2018-11-10 DIAGNOSIS — J449 Chronic obstructive pulmonary disease, unspecified: Secondary | ICD-10-CM | POA: Diagnosis not present

## 2018-11-10 DIAGNOSIS — I4891 Unspecified atrial fibrillation: Secondary | ICD-10-CM | POA: Diagnosis not present

## 2018-11-10 DIAGNOSIS — I5022 Chronic systolic (congestive) heart failure: Secondary | ICD-10-CM | POA: Diagnosis not present

## 2018-11-16 DIAGNOSIS — R300 Dysuria: Secondary | ICD-10-CM | POA: Diagnosis not present

## 2018-11-20 ENCOUNTER — Ambulatory Visit (INDEPENDENT_AMBULATORY_CARE_PROVIDER_SITE_OTHER): Payer: Medicare Other | Admitting: *Deleted

## 2018-11-20 ENCOUNTER — Other Ambulatory Visit: Payer: Self-pay

## 2018-11-20 DIAGNOSIS — I4891 Unspecified atrial fibrillation: Secondary | ICD-10-CM | POA: Diagnosis not present

## 2018-11-20 DIAGNOSIS — Z5181 Encounter for therapeutic drug level monitoring: Secondary | ICD-10-CM

## 2018-11-20 LAB — POCT INR: INR: 2.2 (ref 2.0–3.0)

## 2018-11-20 NOTE — Patient Instructions (Signed)
[facility] bubble packing:  Using Warfarin 2.5mg  tablet now Continue coumadin 1 tablet daily except 2 tablets on Mondays.  Recheck in 5 wks. Call [phone] for any questions or medication changes

## 2018-11-27 DIAGNOSIS — Z23 Encounter for immunization: Secondary | ICD-10-CM | POA: Diagnosis not present

## 2018-12-11 DIAGNOSIS — J449 Chronic obstructive pulmonary disease, unspecified: Secondary | ICD-10-CM | POA: Diagnosis not present

## 2018-12-11 DIAGNOSIS — I110 Hypertensive heart disease with heart failure: Secondary | ICD-10-CM | POA: Diagnosis not present

## 2018-12-11 DIAGNOSIS — I5022 Chronic systolic (congestive) heart failure: Secondary | ICD-10-CM | POA: Diagnosis not present

## 2018-12-11 DIAGNOSIS — I4891 Unspecified atrial fibrillation: Secondary | ICD-10-CM | POA: Diagnosis not present

## 2018-12-19 ENCOUNTER — Ambulatory Visit: Payer: Medicare Other | Admitting: Cardiology

## 2018-12-22 ENCOUNTER — Other Ambulatory Visit: Payer: Self-pay | Admitting: Cardiology

## 2018-12-25 ENCOUNTER — Ambulatory Visit (INDEPENDENT_AMBULATORY_CARE_PROVIDER_SITE_OTHER): Payer: Medicare Other | Admitting: *Deleted

## 2018-12-25 ENCOUNTER — Other Ambulatory Visit: Payer: Self-pay

## 2018-12-25 DIAGNOSIS — Z5181 Encounter for therapeutic drug level monitoring: Secondary | ICD-10-CM | POA: Diagnosis not present

## 2018-12-25 DIAGNOSIS — I4891 Unspecified atrial fibrillation: Secondary | ICD-10-CM

## 2018-12-25 LAB — POCT INR: INR: 2.5 (ref 2.0–3.0)

## 2018-12-25 NOTE — Patient Instructions (Signed)
[facility] bubble packing:  Using Warfarin 2.5mg  tablet now Continue coumadin 1 tablet daily except 2 tablets on Mondays.  Recheck in 6 wks. Call [phone] for any questions or medication changes

## 2019-01-11 DIAGNOSIS — Z87891 Personal history of nicotine dependence: Secondary | ICD-10-CM | POA: Diagnosis not present

## 2019-01-11 DIAGNOSIS — J449 Chronic obstructive pulmonary disease, unspecified: Secondary | ICD-10-CM | POA: Diagnosis not present

## 2019-01-11 DIAGNOSIS — I110 Hypertensive heart disease with heart failure: Secondary | ICD-10-CM | POA: Diagnosis not present

## 2019-01-11 DIAGNOSIS — I5022 Chronic systolic (congestive) heart failure: Secondary | ICD-10-CM | POA: Diagnosis not present

## 2019-01-30 ENCOUNTER — Encounter: Payer: Self-pay | Admitting: Cardiology

## 2019-01-30 ENCOUNTER — Telehealth (INDEPENDENT_AMBULATORY_CARE_PROVIDER_SITE_OTHER): Payer: Medicare Other | Admitting: Cardiology

## 2019-01-30 ENCOUNTER — Other Ambulatory Visit: Payer: Self-pay | Admitting: Cardiology

## 2019-01-30 VITALS — BP 110/70 | HR 73 | Ht 62.0 in | Wt 144.0 lb

## 2019-01-30 DIAGNOSIS — I5022 Chronic systolic (congestive) heart failure: Secondary | ICD-10-CM

## 2019-01-30 DIAGNOSIS — I2510 Atherosclerotic heart disease of native coronary artery without angina pectoris: Secondary | ICD-10-CM | POA: Diagnosis not present

## 2019-01-30 DIAGNOSIS — E7849 Other hyperlipidemia: Secondary | ICD-10-CM | POA: Diagnosis not present

## 2019-01-30 DIAGNOSIS — E782 Mixed hyperlipidemia: Secondary | ICD-10-CM | POA: Diagnosis not present

## 2019-01-30 DIAGNOSIS — I255 Ischemic cardiomyopathy: Secondary | ICD-10-CM | POA: Diagnosis not present

## 2019-01-30 DIAGNOSIS — I10 Essential (primary) hypertension: Secondary | ICD-10-CM

## 2019-01-30 NOTE — Progress Notes (Signed)
Virtual Visit via Telephone Note   This visit type was conducted due to national recommendations for restrictions regarding the COVID-19 Pandemic (e.g. social distancing) in an effort to limit this patient's exposure and mitigate transmission in our community.  Due to his co-morbid illnesses, this patient is at least at moderate risk for complications without adequate follow up.  This format is felt to be most appropriate for this patient at this time.  The patient did not have access to video technology/had technical difficulties with video requiring transitioning to audio format only (telephone).  All issues noted in this document were discussed and addressed.  No physical exam could be performed with this format.  Please refer to the patient's chart for his  consent to telehealth for [facility].   Date:  01/30/2019   ID:  Vincent Carter, DOB August 03, 1945, MRN [id number]  Patient Location: Home Provider Location: Office  PCP:  [name], MD  Cardiologist:  Carlyle Dolly, MD  Electrophysiologist:  None   Evaluation Performed:  Follow-Up Visit  Chief Complaint:  Follow up  History of Present Illness:    Vincent Carter is a 74 y.o. male seen today for follow up of the following medical problems.   1. CAD  - CABG in 1997, redo in 2006 as described below.  - ejection fraction in 2006 reported at 40-45%. Repeat echo 02/2013 LVEF 45-50%.     - no recent chest pain. Breathing is up down - compliant with meds  2. HTN  - he is compliant with meds  3. Hyperlipidemia - Statins cause headaches and fatigue, currently not on therapy  03/2018 TC 97 TG 92 HDL 31 LDL 48    4. AAA screen - negatvieUS9/2017  5. Chronic systolic HF - admitted 0000000 with CHF - 03/2018 echo LVEF 40-45% - diuresed over 9L from 168 lbs to [id number] lbs  - complicated diuretic history. Had been on lasix. There was some concern it was causing a rash and lasix was stopped.  Rash is ongoing and I would say lasix was not involved. We tried chlorthalidone but he reports this caused diarrhea. He has ethacrynic acid on his med list now but he does not recall ever taking or who switched him. Currently taking over the counter diurex.   - weights stable around 144 lbs. No recent LE edema.   6. Afib - new diagnosis during 03/2018 admission  - He reports side effects on eliquis with vision changes and muscle aches, resolved. Now on coumadin  - no recent palpitations - no bleeding on coumadin.    7. NSVT - multiple episodes during 03/2018 admission, up to 20 beats - started on oral amio, plans to repeat ischemic testing once COVID-19 restrictions lightened  Only given 2 week course of amio at discharge, now off -denies any palpitations     8. Rash - recent diffuse rash, pruritic.he reports it started after hospital discharge in 03/2018 - from chart review new meds during that admission were eliquis (changed to coumadin later on), amiodarone (off after 2 weeks), lasix.  - rash still ongoing.    The patient does not have symptoms concerning for COVID-19 infection (fever, chills, cough, or new shortness of breath).    Past Medical History:  Diagnosis Date  . Arthritis   . ASCVD (arteriosclerotic cardiovascular disease) cardiologist--  is now dr Roderic Palau Jameal Razzano (was dr [name])    CABG in 1997 -- LIMA to LAD;  cath 07-17-2012 PCI and BMStenting;  RE-DO CABG X4  in  07-24-2004 three-vessel disease; cath in 08-24-2004 post CABG ef 40-45% w/ anteroapical hypokinesis; occlusion SVG  to  RI; patent RIMA to LAD; unsuccessful PCI in totally occluded RI graft.;  last myoview 09-09-2013, no ischemia, moderate inferior wall defect, ef 49%  . CHF (congestive heart failure) ([place])   . Chronic obstructive pulmonary disease ([place])   . Diverticulosis of colon   . Euthyroid goiter    with low TSH   . History of colon polyps    hyperplastic 2011  . History of  diverticulitis   . History of kidney stones   . History of ST elevation myocardial infarction (STEMI)    07-17-2004   w/ extensive RV infact per cath report  . HOH (hard of hearing)    no hearing aid  . Hypertension   . Low TSH level    Clinically euthyroid  . Peripheral vascular disease (HCC)    Decreased distal pulses and abnormal Doppler  . Prostate cancer [facility]) urologist--  dr Jenny Reichmann wrenn   T1c, Gleason 3+4=7,  PSA 4.71,  vol 38cc  . Ptosis    Left, secondary to injury  . S/p bare metal coronary artery stent    x3  to RCA  07-17-2004   Past Surgical History:  Procedure Laterality Date  . APPENDECTOMY  1973  . CARDIAC CATHETERIZATION  08-24-2004  dr Lia Foyer   post re-do CABG/  Unsuccessful attempt PCI of clotted SVG to RI/  anteroapical segment severely hypokinetic,  ef 40-45%/  fresh occlusion SVG to RI,  continued patency of SVG to PAD and RIMA to LAD /   dLM 80%  . CARDIOVASCULAR STRESS TEST  09-09-2013  dr Roderic Palau Brain Honeycutt   no reversible ischemia,  moderately large defect noted in the inferior wall extending from  mid ventricle to apex also involves true ventricle apex and anteroapical wall/  hypokinesis of inferior wall in region of prior infarct/scarring/  LVEF 49%  . CATARACT EXTRACTION W/ INTRAOCULAR LENS  IMPLANT, BILATERAL  2006  . COLONOSCOPY W/ POLYPECTOMY  12-26-2009  . CORONARY ANGIOPLASTY WITH STENT PLACEMENT  07-17-2004   dr Sabino Snipes   PCI w/ BMS x3 to RCA and balloon pump placement/  LVSF after revascularization , ef 65% w/ mild inferior hypokinesis/  dLM  70% extending into origins of LAD (70% ostial, 80% mid, D1 Eliora Nienhuis 90% ostial)  CFX (80% ostial OM, 70% after first Tyri Elmore) /   99% mRCA stented and no residual;  remains 80% origin of posterior LV Radford Pease/  most apical portion of PDA was embolized   . CORONARY ARTERY BYPASS GRAFT  1997   dr Merleen Nicely   LIMA to LAD  . CORONARY ARTERY BYPASS GRAFT  07-24-2004  dr gerhardt   RIMA to LAD,  SVG to OM,  SVG to  PDA,  SVG to RI  . EYE SURGERY Left 1996   repair ocular injury  . ORIF RIGHT TIB-FIB FX  1990's  . PROSTATE BIOPSY    . RADIOACTIVE SEED IMPLANT N/A 05/02/2014   Procedure: RADIOACTIVE SEED IMPLANT;  Surgeon: Irine Seal, MD;  Location: [facility];  Service: Urology;  Laterality: N/A;  . TONSILLECTOMY  age 16  . TOTAL HIP ARTHROPLASTY Left 1983   RECONSTRUCTION/ THA/  SKIN AND BONE GRAFTS--  pt fell from 5 stories off [place]  . TRANSTHORACIC ECHOCARDIOGRAM  0000000   diastolic dysfunction/  ef 45-50%/  mild AV calcification without stenosis,  mild AR/  mild LAE and RAE/  trivial MR and TR       Current Meds  Medication Sig  . albuterol (PROVENTIL) (2.5 MG/3ML) 0.083% nebulizer solution Take 3 mLs (2.5 mg total) by nebulization every 4 (four) hours as needed for shortness of breath.  Marland Kitchen azithromycin (ZITHROMAX) 250 MG tablet Take 250 mg by mouth daily.  . budesonide-formoterol (SYMBICORT) 80-4.5 MCG/ACT inhaler Inhale 2 puffs into the lungs 2 (two) times daily.  Marland Kitchen Caffeine-Magnesium Salicylate (DIUREX PO) Take 50 mg by mouth 3 (three) times daily.  . carvedilol (COREG) 12.5 MG tablet TAKE 1.5 TABLETS BY MOUTH TWICE DAILY.  Marland Kitchen dextromethorphan-guaiFENesin (MUCINEX DM) 30-600 MG 12hr tablet Take 1 tablet by mouth 2 (two) times daily.  Marland Kitchen ethacrynic acid (EDECRIN) 25 MG tablet Take 25 mg by mouth.  . fluticasone (FLONASE) 50 MCG/ACT nasal spray Place 1 spray into both nostrils 2 (two) times daily.  Marland Kitchen lisinopril (ZESTRIL) 2.5 MG tablet TAKE ONE TABLET BY MOUTH DAILY.  . Nebulizer MISC Please provide 1 nebulizer machine and all supplies. Diagnosis: J44.1, Office number: [phone]  . nitroGLYCERIN (NITROSTAT) 0.4 MG SL tablet PLACE 1 TABLET UNDER TONGUE EVERY 5 MINUTES AS NEEDED FOR CHEST PAIN.  Marland Kitchen Omega-3 Fatty Acids (FISH OIL) 300 MG CAPS Take 2 capsules by mouth 2 (two) times daily.    Marland Kitchen warfarin (COUMADIN) 2.5 MG tablet Take 1 tablet daily except 2 tablets on Mondays or  as directed  [facility] to package for patient     Allergies:   Codeine and Statins   Social History   Tobacco Use  . Smoking status: Former Smoker    Packs/day: 1.00    Years: 55.00    Pack years: 55.00    Types: Cigarettes    Start date: 08/13/1958    Quit date: 04/12/2018    Years since quitting: 0.8  . Smokeless tobacco: Never Used  Substance Use Topics  . Alcohol use: No    Alcohol/week: 0.0 standard drinks  . Drug use: No     Family Hx: The patient's family history includes Cancer in his brother; Clotting disorder in his mother; Heart attack in his mother; Lung cancer in his father.  ROS:   Please see the history of present illness.    All other systems reviewed and are negative.   Prior CV studies:   The following studies were reviewed today:  Labs/Other Tests and Data Reviewed:    EKG:  n/a  Recent Labs: 03/24/2018: B Natriuretic Peptide 545.0; Hemoglobin 17.0; Platelets 152 03/25/2018: ALT 20; TSH 0.594 03/29/2018: Magnesium 2.4 03/30/2018: BUN 42; Creatinine, Ser 1.30; Potassium 5.2; Sodium 134   Recent Lipid Panel Lab Results  Component Value Date/Time   CHOL 97 03/24/2018 12:35 PM   TRIG 92 03/24/2018 12:35 PM   HDL 31 (L) 03/24/2018 12:35 PM   CHOLHDL 3.1 03/24/2018 12:35 PM   LDLCALC 48 03/24/2018 12:35 PM    Wt Readings from Last 3 Encounters:  01/30/19 144 lb (65.3 kg)  08/09/18 149 lb (67.6 kg)  06/08/18 139 lb (63 kg)     Objective:    Vital Signs:  BP 110/70   Pulse 73   Ht 5\' 2"  (1.575 m)   Wt 144 lb (65.3 kg)   SpO2 97%   BMI 26.34 kg/m    Normal affect. Normal speech pattern and tone. Comfortable, no apparent distress. No audible signs of SOb or wheezing.   ASSESSMENT & PLAN:    1. CAD/ICM/Chronic systolic HF - no  recent symptoms - diuretic history as reported above. If recurrent issues with fluid overload I would go back to lasix, appears was not related to his rash  2. HTN  -he is at goal, cotntinue current  meds  3. Afib - side effects on eliquis, tolerating coumadin without troubles - no recent symptoms, continue current meds  4. Hyperlipidemia - intolerant to statins. LDL 48 by last check, really doesn't justify alternative therapy at this time   COVID-19 Education: The signs and symptoms of COVID-19 were discussed with the patient and how to seek care for testing (follow up with PCP or arrange E-visit).  The importance of social distancing was discussed today.  Time:   Today, I have spent 22 minutes with the patient with telehealth technology discussing the above problems.     Medication Adjustments/Labs and Tests Ordered: Current medicines are reviewed at length with the patient today.  Concerns regarding medicines are outlined above.   Tests Ordered: No orders of the defined types were placed in this encounter.   Medication Changes: No orders of the defined types were placed in this encounter.   Follow Up:  Either In Person or Virtual in 6 month(s)  Signed, Carlyle Dolly, MD  01/30/2019 10:24 AM

## 2019-01-30 NOTE — Patient Instructions (Signed)

## 2019-02-01 ENCOUNTER — Other Ambulatory Visit: Payer: Self-pay | Admitting: Cardiology

## 2019-02-05 ENCOUNTER — Other Ambulatory Visit: Payer: Self-pay

## 2019-02-05 ENCOUNTER — Ambulatory Visit (INDEPENDENT_AMBULATORY_CARE_PROVIDER_SITE_OTHER): Payer: Medicare Other | Admitting: *Deleted

## 2019-02-05 DIAGNOSIS — Z5181 Encounter for therapeutic drug level monitoring: Secondary | ICD-10-CM | POA: Diagnosis not present

## 2019-02-05 DIAGNOSIS — I4891 Unspecified atrial fibrillation: Secondary | ICD-10-CM | POA: Diagnosis not present

## 2019-02-05 LAB — POCT INR: INR: 2 (ref 2.0–3.0)

## 2019-02-05 NOTE — Patient Instructions (Signed)
[facility] bubble packing:  Using Warfarin 2.5mg  tablet now Continue coumadin 1 tablet daily except 2 tablets on Mondays.  Recheck in 6 wks. Call [phone] for any questions or medication changes

## 2019-03-05 ENCOUNTER — Other Ambulatory Visit: Payer: Self-pay

## 2019-03-05 NOTE — Patient Outreach (Signed)
[facility]) Care Management  03/05/2019  Vincent Carter 08/02/1945 [id number]   Medication Adherence call to [name] HIPPA Compliant Voice message left with a call back number. [name] is showing past due on Lisinopril 2.5 mg under [name].    [place] Management Direct Dial [phone]  Fax [phone] [name]

## 2019-03-11 DIAGNOSIS — I5032 Chronic diastolic (congestive) heart failure: Secondary | ICD-10-CM | POA: Diagnosis not present

## 2019-03-11 DIAGNOSIS — J449 Chronic obstructive pulmonary disease, unspecified: Secondary | ICD-10-CM | POA: Diagnosis not present

## 2019-03-11 DIAGNOSIS — I110 Hypertensive heart disease with heart failure: Secondary | ICD-10-CM | POA: Diagnosis not present

## 2019-03-11 DIAGNOSIS — I4891 Unspecified atrial fibrillation: Secondary | ICD-10-CM | POA: Diagnosis not present

## 2019-03-19 ENCOUNTER — Ambulatory Visit (INDEPENDENT_AMBULATORY_CARE_PROVIDER_SITE_OTHER): Payer: Medicare Other | Admitting: *Deleted

## 2019-03-19 ENCOUNTER — Other Ambulatory Visit: Payer: Self-pay

## 2019-03-19 DIAGNOSIS — Z5181 Encounter for therapeutic drug level monitoring: Secondary | ICD-10-CM

## 2019-03-19 DIAGNOSIS — I4891 Unspecified atrial fibrillation: Secondary | ICD-10-CM | POA: Diagnosis not present

## 2019-03-19 LAB — POCT INR: INR: 1.9 — AB (ref 2.0–3.0)

## 2019-03-19 NOTE — Patient Instructions (Addendum)
 [facility]othe[facility]ary bubble packing:  Using Warfarin 2.5mg  tablet now Continue warfarin 1 tablet daily except 2 tablets on Mondays.  Recheck in 6 wks. Starting fish oil 1 tablet twice daily Call [phone] for any questions or medication changes

## 2019-04-04 ENCOUNTER — Other Ambulatory Visit: Payer: Self-pay | Admitting: Cardiology

## 2019-04-30 ENCOUNTER — Ambulatory Visit (INDEPENDENT_AMBULATORY_CARE_PROVIDER_SITE_OTHER): Payer: Medicare Other | Admitting: *Deleted

## 2019-04-30 ENCOUNTER — Other Ambulatory Visit: Payer: Self-pay

## 2019-04-30 DIAGNOSIS — I4891 Unspecified atrial fibrillation: Secondary | ICD-10-CM | POA: Diagnosis not present

## 2019-04-30 DIAGNOSIS — Z5181 Encounter for therapeutic drug level monitoring: Secondary | ICD-10-CM | POA: Diagnosis not present

## 2019-04-30 LAB — POCT INR: INR: 2.3 (ref 2.0–3.0)

## 2019-04-30 NOTE — Patient Instructions (Signed)
[facility] bubble packing:  Using Warfarin 2.5mg tablet now Continue warfarin 1 tablet daily except 2 tablets on Mondays.  Recheck in 6 wks. Call [phone] for any questions or medication changes 

## 2019-05-02 ENCOUNTER — Other Ambulatory Visit: Payer: Self-pay | Admitting: Cardiology

## 2019-05-03 ENCOUNTER — Other Ambulatory Visit: Payer: Self-pay | Admitting: Cardiology

## 2019-05-11 DIAGNOSIS — I5022 Chronic systolic (congestive) heart failure: Secondary | ICD-10-CM | POA: Diagnosis not present

## 2019-05-11 DIAGNOSIS — I110 Hypertensive heart disease with heart failure: Secondary | ICD-10-CM | POA: Diagnosis not present

## 2019-05-11 DIAGNOSIS — J449 Chronic obstructive pulmonary disease, unspecified: Secondary | ICD-10-CM | POA: Diagnosis not present

## 2019-05-11 DIAGNOSIS — Z87891 Personal history of nicotine dependence: Secondary | ICD-10-CM | POA: Diagnosis not present

## 2019-06-05 ENCOUNTER — Other Ambulatory Visit: Payer: Self-pay | Admitting: Cardiology

## 2019-06-12 ENCOUNTER — Ambulatory Visit (INDEPENDENT_AMBULATORY_CARE_PROVIDER_SITE_OTHER): Payer: Medicare Other | Admitting: *Deleted

## 2019-06-12 ENCOUNTER — Other Ambulatory Visit: Payer: Self-pay

## 2019-06-12 DIAGNOSIS — Z5181 Encounter for therapeutic drug level monitoring: Secondary | ICD-10-CM

## 2019-06-12 DIAGNOSIS — I4891 Unspecified atrial fibrillation: Secondary | ICD-10-CM | POA: Diagnosis not present

## 2019-06-12 LAB — POCT INR: INR: 2.3 (ref 2.0–3.0)

## 2019-06-12 NOTE — Patient Instructions (Signed)
[facility] bubble packing:  Using Warfarin 2.5mg tablet now Continue warfarin 1 tablet daily except 2 tablets on Mondays.  Recheck in 6 wks. Call [phone] for any questions or medication changes 

## 2019-07-11 DIAGNOSIS — I5022 Chronic systolic (congestive) heart failure: Secondary | ICD-10-CM | POA: Diagnosis not present

## 2019-07-11 DIAGNOSIS — Z87891 Personal history of nicotine dependence: Secondary | ICD-10-CM | POA: Diagnosis not present

## 2019-07-11 DIAGNOSIS — I110 Hypertensive heart disease with heart failure: Secondary | ICD-10-CM | POA: Diagnosis not present

## 2019-07-11 DIAGNOSIS — J449 Chronic obstructive pulmonary disease, unspecified: Secondary | ICD-10-CM | POA: Diagnosis not present

## 2019-07-24 ENCOUNTER — Ambulatory Visit (INDEPENDENT_AMBULATORY_CARE_PROVIDER_SITE_OTHER): Payer: Medicare Other | Admitting: *Deleted

## 2019-07-24 DIAGNOSIS — Z5181 Encounter for therapeutic drug level monitoring: Secondary | ICD-10-CM

## 2019-07-24 DIAGNOSIS — I4891 Unspecified atrial fibrillation: Secondary | ICD-10-CM

## 2019-07-24 LAB — POCT INR: INR: 2.7 (ref 2.0–3.0)

## 2019-07-24 NOTE — Patient Instructions (Signed)
[facility] bubble packing:  Using Warfarin 2.5mg  tablet now Continue warfarin 1 tablet daily except 2 tablets on Mondays.  Recheck in 6 wks. Call [phone] for any questions or medication changes

## 2019-08-13 ENCOUNTER — Ambulatory Visit: Payer: Medicare Other | Admitting: Cardiology

## 2019-08-13 NOTE — Progress Notes (Deleted)
Clinical Summary Vincent Carter is a 74 y.o.male seen today for follow up of the following medical problems.   1. CAD  - CABG in 1997, redo in 2006 as described below.  - ejection fraction in 2006 reported at 40-45%. Repeat echo 02/2013 LVEF 45-50%.     - no recent chest pain. Breathing is up down - compliant with meds  2. HTN  - he is compliant with meds  3. Hyperlipidemia - Statins cause headaches and fatigue, currently not on therapy  03/2018 TC 97 TG 92 HDL 31 LDL 48    4. AAA screen - negatvieUS9/2017  5. Chronic systolic HF - admitted 05/4648 with CHF - 03/2018 echo LVEF 40-45% - diuresed over 9L from 168 lbs to 354 lbs  - complicated diuretic history. Had been on lasix. There was some concern it was causing a rash and lasix was stopped. Rash is ongoing and I would say lasix was not involved. We tried chlorthalidone but he reports this caused diarrhea. He has ethacrynic acid on his med list now but he does not recall ever taking or who switched him. Currently taking over the counter diurex.   - weights stable around 144 lbs. No recent LE edema.   6. Afib - new diagnosis during 03/2018 admission  - He reports side effects on eliquis with vision changes and muscle aches, resolved. Now on coumadin  - no recent palpitations - no bleeding on coumadin.    7. NSVT - multiple episodes during 03/2018 admission, up to 20 beats - 03/2018 echo LVEF 40-45% - 08/2018 nuclear stress: prior infarct, no current ischemia.   Only given 2 week course of amio at discharge, now off -denies any palpitations     8. Rash - recent diffuse rash, pruritic.he reports it started after hospital discharge in 03/2018 -from chart review new meds during that admission were eliquis (changed to coumadin later on), amiodarone (off after 2 weeks), lasix.  - rash still ongoing.    Past Medical History:  Diagnosis Date  . Arthritis   . ASCVD  (arteriosclerotic cardiovascular disease) cardiologist--  is now dr Roderic Palau Yarelis Ambrosino (was dr [name])    CABG in 1997 -- LIMA to LAD;  cath 07-17-2012 PCI and BMStenting;   RE-DO CABG X4  in  07-24-2004 three-vessel disease; cath in 08-24-2004 post CABG ef 40-45% w/ anteroapical hypokinesis; occlusion SVG  to  RI; patent RIMA to LAD; unsuccessful PCI in totally occluded RI graft.;  last myoview 09-09-2013, no ischemia, moderate inferior wall defect, ef 49%  . CHF (congestive heart failure) ([place])   . Chronic obstructive pulmonary disease ([place])   . Diverticulosis of colon   . Euthyroid goiter    with low TSH   . History of colon polyps    hyperplastic 2011  . History of diverticulitis   . History of kidney stones   . History of ST elevation myocardial infarction (STEMI)    07-17-2004   w/ extensive RV infact per cath report  . HOH (hard of hearing)    no hearing aid  . Hypertension   . Low TSH level    Clinically euthyroid  . Peripheral vascular disease (HCC)    Decreased distal pulses and abnormal Doppler  . Prostate cancer [facility]) urologist--  dr Jenny Reichmann wrenn   T1c, Gleason 3+4=7,  PSA 4.71,  vol 38cc  . Ptosis    Left, secondary to injury  . S/p bare metal coronary artery stent    x3  to RCA  07-17-2004     Allergies  Allergen Reactions  . Codeine Nausea Only    Pill form of codeine causes nausea  . Statins Other (See Comments)    Myalgia      Current Outpatient Medications  Medication Sig Dispense Refill  . warfarin (COUMADIN) 2.5 MG tablet TAKE 1 TABLET BY MOUTH DAILY EXCEPT 2 TABLETS ON MONDAYS OR AS DIRECTED 35 tablet 6  . albuterol (PROVENTIL) (2.5 MG/3ML) 0.083% nebulizer solution Take 3 mLs (2.5 mg total) by nebulization every 4 (four) hours as needed for shortness of breath. 75 mL 12  . azithromycin (ZITHROMAX) 250 MG tablet Take 250 mg by mouth daily.    . budesonide-formoterol (SYMBICORT) 80-4.5 MCG/ACT inhaler Inhale 2 puffs into the lungs 2 (two) times daily. 1  Inhaler 12  . Caffeine-Magnesium Salicylate (DIUREX PO) Take 50 mg by mouth 3 (three) times daily.    . carvedilol (COREG) 12.5 MG tablet TAKE 1.5 TABLETS BY MOUTH TWICE DAILY. 180 tablet 3  . dextromethorphan-guaiFENesin (MUCINEX DM) 30-600 MG 12hr tablet Take 1 tablet by mouth 2 (two) times daily. 30 tablet 0  . ethacrynic acid (EDECRIN) 25 MG tablet Take 25 mg by mouth.    . fluticasone (FLONASE) 50 MCG/ACT nasal spray Place 1 spray into both nostrils 2 (two) times daily.    Marland Kitchen lisinopril (ZESTRIL) 2.5 MG tablet TAKE ONE TABLET BY MOUTH DAILY. 30 tablet 6  . Nebulizer MISC Please provide 1 nebulizer machine and all supplies. Diagnosis: J44.1, Office number: [phone] 1 each 0  . nitroGLYCERIN (NITROSTAT) 0.4 MG SL tablet PLACE 1 TABLET UNDER TONGUE EVERY 5 MINUTES AS NEEDED FOR CHEST PAIN. 25 tablet 3  . Omega-3 Fatty Acids (FISH OIL) 300 MG CAPS Take 2 capsules by mouth 2 (two) times daily.       No current facility-administered medications for this visit.     Past Surgical History:  Procedure Laterality Date  . APPENDECTOMY  1973  . CARDIAC CATHETERIZATION  08-24-2004  dr Lia Foyer   post re-do CABG/  Unsuccessful attempt PCI of clotted SVG to RI/  anteroapical segment severely hypokinetic,  ef 40-45%/  fresh occlusion SVG to RI,  continued patency of SVG to PAD and RIMA to LAD /   dLM 80%  . CARDIOVASCULAR STRESS TEST  09-09-2013  dr Roderic Palau Jarmon Javid   no reversible ischemia,  moderately large defect noted in the inferior wall extending from  mid ventricle to apex also involves true ventricle apex and anteroapical wall/  hypokinesis of inferior wall in region of prior infarct/scarring/  LVEF 49%  . CATARACT EXTRACTION W/ INTRAOCULAR LENS  IMPLANT, BILATERAL  2006  . COLONOSCOPY W/ POLYPECTOMY  12-26-2009  . CORONARY ANGIOPLASTY WITH STENT PLACEMENT  07-17-2004   dr Sabino Snipes   PCI w/ BMS x3 to RCA and balloon pump placement/  LVSF after revascularization , ef 65% w/ mild inferior  hypokinesis/  dLM  70% extending into origins of LAD (70% ostial, 80% mid, D1 Kami Kube 90% ostial)  CFX (80% ostial OM, 70% after first Shreya Lacasse) /   99% mRCA stented and no residual;  remains 80% origin of posterior LV Isidro Monks/  most apical portion of PDA was embolized   . CORONARY ARTERY BYPASS GRAFT  1997   dr Merleen Nicely   LIMA to LAD  . CORONARY ARTERY BYPASS GRAFT  07-24-2004  dr gerhardt   RIMA to LAD,  SVG to OM,  SVG to PDA,  SVG to RI  .  EYE SURGERY Left 1996   repair ocular injury  . ORIF RIGHT TIB-FIB FX  1990's  . PROSTATE BIOPSY    . RADIOACTIVE SEED IMPLANT N/A 05/02/2014   Procedure: RADIOACTIVE SEED IMPLANT;  Surgeon: Irine Seal, MD;  Location: [facility];  Service: Urology;  Laterality: N/A;  . TONSILLECTOMY  age 34  . TOTAL HIP ARTHROPLASTY Left 1983   RECONSTRUCTION/ THA/  SKIN AND BONE GRAFTS--  pt fell from 5 stories off [place]  . TRANSTHORACIC ECHOCARDIOGRAM  90-30-0923   diastolic dysfunction/  ef 45-50%/  mild AV calcification without stenosis,  mild AR/  mild LAE and RAE/  trivial MR and TR       Allergies  Allergen Reactions  . Codeine Nausea Only    Pill form of codeine causes nausea  . Statins Other (See Comments)    Myalgia       Family History  Problem Relation Age of Onset  . Clotting disorder Mother   . Heart attack Mother   . Lung cancer Father   . Cancer Brother        prostate     Social History Mr. Gann reports that he quit smoking about 16 months ago. His smoking use included cigarettes. He started smoking about 61 years ago. He has a 55.00 pack-year smoking history. He has never used smokeless tobacco. Mr. Magri reports no history of alcohol use.   Review of Systems CONSTITUTIONAL: No weight loss, fever, chills, weakness or fatigue.  HEENT: Eyes: No visual loss, blurred vision, double vision or yellow sclerae.No hearing loss, sneezing, congestion, runny nose or sore throat.  SKIN: No rash or itching.    CARDIOVASCULAR:  RESPIRATORY: No shortness of breath, cough or sputum.  GASTROINTESTINAL: No anorexia, nausea, vomiting or diarrhea. No abdominal pain or blood.  GENITOURINARY: No burning on urination, no polyuria NEUROLOGICAL: No headache, dizziness, syncope, paralysis, ataxia, numbness or tingling in the extremities. No change in bowel or bladder control.  MUSCULOSKELETAL: No muscle, back pain, joint pain or stiffness.  LYMPHATICS: No enlarged nodes. No history of splenectomy.  PSYCHIATRIC: No history of depression or anxiety.  ENDOCRINOLOGIC: No reports of sweating, cold or heat intolerance. No polyuria or polydipsia.  Marland Kitchen   Physical Examination There were no vitals filed for this visit. There were no vitals filed for this visit.  Gen: resting comfortably, no acute distress HEENT: no scleral icterus, pupils equal round and reactive, no palptable cervical adenopathy,  CV Resp: Clear to auscultation bilaterally GI: abdomen is soft, non-tender, non-distended, normal bowel sounds, no hepatosplenomegaly MSK: extremities are warm, no edema.  Skin: warm, no rash Neuro:  no focal deficits Psych: appropriate affect   Diagnostic Studies     Assessment and Plan   1. CAD/ICM/Chronic systolic HF -no recent symptoms - diuretic history as reported above. If recurrent issues with fluid overload I would go back to lasix, appears was not related to his rash  2. HTN  -he is at goal, cotntinue current meds  3. Afib - side effects on eliquis, tolerating coumadin without troubles - no recent symptoms, continue current meds  4. Hyperlipidemia - intolerant to statins. LDL 48 by last check, really doesn't justify alternative therapy at this time      Arnoldo Lenis, M.D., F.A.C.C.

## 2019-09-04 ENCOUNTER — Ambulatory Visit (INDEPENDENT_AMBULATORY_CARE_PROVIDER_SITE_OTHER): Payer: Medicare Other | Admitting: *Deleted

## 2019-09-04 DIAGNOSIS — Z5181 Encounter for therapeutic drug level monitoring: Secondary | ICD-10-CM

## 2019-09-04 DIAGNOSIS — I4891 Unspecified atrial fibrillation: Secondary | ICD-10-CM

## 2019-09-04 LAB — POCT INR: INR: 1.8 — AB (ref 2.0–3.0)

## 2019-09-04 MED ORDER — WARFARIN SODIUM 2.5 MG PO TABS: ORAL_TABLET | ORAL | 6 refills | Status: DC

## 2019-09-04 NOTE — Patient Instructions (Signed)
[facility] bubble packing:  Using Warfarin 2.5mg  tablet now Take warfarin 2 tablets today then resume 1 tablet daily except 2 tablets on Mondays.  Recheck in 4 wks. Call [phone] for any questions or medication changes

## 2019-09-07 ENCOUNTER — Other Ambulatory Visit: Payer: Self-pay | Admitting: Cardiology

## 2019-09-07 DIAGNOSIS — J449 Chronic obstructive pulmonary disease, unspecified: Secondary | ICD-10-CM | POA: Diagnosis not present

## 2019-09-07 DIAGNOSIS — I4891 Unspecified atrial fibrillation: Secondary | ICD-10-CM | POA: Diagnosis not present

## 2019-09-07 DIAGNOSIS — I110 Hypertensive heart disease with heart failure: Secondary | ICD-10-CM | POA: Diagnosis not present

## 2019-09-11 DIAGNOSIS — I5022 Chronic systolic (congestive) heart failure: Secondary | ICD-10-CM | POA: Diagnosis not present

## 2019-09-11 DIAGNOSIS — J449 Chronic obstructive pulmonary disease, unspecified: Secondary | ICD-10-CM | POA: Diagnosis not present

## 2019-09-11 DIAGNOSIS — Z87891 Personal history of nicotine dependence: Secondary | ICD-10-CM | POA: Diagnosis not present

## 2019-09-11 DIAGNOSIS — I110 Hypertensive heart disease with heart failure: Secondary | ICD-10-CM | POA: Diagnosis not present

## 2019-10-02 ENCOUNTER — Ambulatory Visit (INDEPENDENT_AMBULATORY_CARE_PROVIDER_SITE_OTHER): Payer: Medicare Other | Admitting: *Deleted

## 2019-10-02 DIAGNOSIS — Z5181 Encounter for therapeutic drug level monitoring: Secondary | ICD-10-CM | POA: Diagnosis not present

## 2019-10-02 DIAGNOSIS — I4891 Unspecified atrial fibrillation: Secondary | ICD-10-CM | POA: Diagnosis not present

## 2019-10-02 LAB — POCT INR: INR: 3.7 — AB (ref 2.0–3.0)

## 2019-10-02 NOTE — Patient Instructions (Signed)
[facility] bubble packing:  Using Warfarin 2.5mg  tablet now Hold warfarin tonight then resume 1 tablet daily except 2 tablets on Mondays.  Recheck in 3 wks. Call [phone] for any questions or medication changes

## 2019-10-23 ENCOUNTER — Ambulatory Visit (INDEPENDENT_AMBULATORY_CARE_PROVIDER_SITE_OTHER): Payer: Medicare Other | Admitting: *Deleted

## 2019-10-23 DIAGNOSIS — Z23 Encounter for immunization: Secondary | ICD-10-CM | POA: Diagnosis not present

## 2019-10-23 DIAGNOSIS — I4891 Unspecified atrial fibrillation: Secondary | ICD-10-CM

## 2019-10-23 DIAGNOSIS — Z5181 Encounter for therapeutic drug level monitoring: Secondary | ICD-10-CM

## 2019-10-23 LAB — POCT INR: INR: 2 (ref 2.0–3.0)

## 2019-10-23 NOTE — Patient Instructions (Signed)
[facility] bubble packing:  Using Warfarin 2.5mg  tablet now Pt brought in bubble pack. Has been taking 2.5mg  daily Continue warfarin 1 tablet daily.  Recheck in 4 wks. Call [phone] for any questions or medication changes

## 2019-11-10 DIAGNOSIS — I110 Hypertensive heart disease with heart failure: Secondary | ICD-10-CM | POA: Diagnosis not present

## 2019-11-10 DIAGNOSIS — I4891 Unspecified atrial fibrillation: Secondary | ICD-10-CM | POA: Diagnosis not present

## 2019-11-10 DIAGNOSIS — J449 Chronic obstructive pulmonary disease, unspecified: Secondary | ICD-10-CM | POA: Diagnosis not present

## 2019-11-10 DIAGNOSIS — I5022 Chronic systolic (congestive) heart failure: Secondary | ICD-10-CM | POA: Diagnosis not present

## 2019-11-20 ENCOUNTER — Ambulatory Visit (INDEPENDENT_AMBULATORY_CARE_PROVIDER_SITE_OTHER): Payer: Medicare Other | Admitting: *Deleted

## 2019-11-20 DIAGNOSIS — Z5181 Encounter for therapeutic drug level monitoring: Secondary | ICD-10-CM

## 2019-11-20 DIAGNOSIS — I4891 Unspecified atrial fibrillation: Secondary | ICD-10-CM

## 2019-11-20 LAB — POCT INR: INR: 1.7 — AB (ref 2.0–3.0)

## 2019-11-20 MED ORDER — WARFARIN SODIUM 2.5 MG PO TABS: ORAL_TABLET | ORAL | 6 refills | Status: DC

## 2019-11-20 NOTE — Patient Instructions (Signed)
[facility] bubble packing:  Using Warfarin 2.5mg  tablet now Pt brought in bubble pack. Has been taking 2.5mg  daily Take warfarin 2 tablets today then increase dose to 1 tablet daily except 2 tablets on Thursdays.  Recheck in 3 wks. Call [phone] for any questions or medication changes

## 2019-11-28 ENCOUNTER — Other Ambulatory Visit: Payer: Self-pay | Admitting: Cardiology

## 2019-12-05 ENCOUNTER — Encounter: Payer: Self-pay | Admitting: Internal Medicine

## 2019-12-11 ENCOUNTER — Ambulatory Visit (INDEPENDENT_AMBULATORY_CARE_PROVIDER_SITE_OTHER): Payer: Medicare Other | Admitting: *Deleted

## 2019-12-11 DIAGNOSIS — I4891 Unspecified atrial fibrillation: Secondary | ICD-10-CM | POA: Diagnosis not present

## 2019-12-11 DIAGNOSIS — Z5181 Encounter for therapeutic drug level monitoring: Secondary | ICD-10-CM

## 2019-12-11 LAB — POCT INR: INR: 2.7 (ref 2.0–3.0)

## 2019-12-11 MED ORDER — WARFARIN SODIUM 2.5 MG PO TABS: ORAL_TABLET | ORAL | 6 refills | Status: DC

## 2019-12-11 NOTE — Patient Instructions (Signed)
[facility] bubble packing:  Using Warfarin 2.5mg  tablet now Continue warfarin 1 tablet daily except 2 tablets on Thursdays.  Recheck in 4 wks. Call [phone] for any questions or medication changes

## 2019-12-31 ENCOUNTER — Other Ambulatory Visit: Payer: Self-pay | Admitting: Cardiology

## 2020-01-11 DIAGNOSIS — I110 Hypertensive heart disease with heart failure: Secondary | ICD-10-CM | POA: Diagnosis not present

## 2020-01-11 DIAGNOSIS — I4891 Unspecified atrial fibrillation: Secondary | ICD-10-CM | POA: Diagnosis not present

## 2020-01-11 DIAGNOSIS — J449 Chronic obstructive pulmonary disease, unspecified: Secondary | ICD-10-CM | POA: Diagnosis not present

## 2020-01-11 DIAGNOSIS — I5022 Chronic systolic (congestive) heart failure: Secondary | ICD-10-CM | POA: Diagnosis not present

## 2020-01-15 ENCOUNTER — Ambulatory Visit (INDEPENDENT_AMBULATORY_CARE_PROVIDER_SITE_OTHER): Payer: Medicare Other | Admitting: *Deleted

## 2020-01-15 DIAGNOSIS — I4891 Unspecified atrial fibrillation: Secondary | ICD-10-CM

## 2020-01-15 DIAGNOSIS — Z5181 Encounter for therapeutic drug level monitoring: Secondary | ICD-10-CM

## 2020-01-15 LAB — POCT INR: INR: 2 (ref 2.0–3.0)

## 2020-01-15 NOTE — Patient Instructions (Signed)
[facility] bubble packing:  Using Warfarin 2.5mg tablet now Continue warfarin 1 tablet daily except 2 tablets on Thursdays.  Recheck in 4 wks. Call [phone] for any questions or medication changes 

## 2020-01-30 ENCOUNTER — Other Ambulatory Visit: Payer: Self-pay | Admitting: Cardiology

## 2020-02-12 ENCOUNTER — Ambulatory Visit (INDEPENDENT_AMBULATORY_CARE_PROVIDER_SITE_OTHER): Payer: Medicare Other | Admitting: *Deleted

## 2020-02-12 DIAGNOSIS — Z5181 Encounter for therapeutic drug level monitoring: Secondary | ICD-10-CM

## 2020-02-12 DIAGNOSIS — I4891 Unspecified atrial fibrillation: Secondary | ICD-10-CM

## 2020-02-12 LAB — POCT INR: INR: 2.2 (ref 2.0–3.0)

## 2020-02-12 NOTE — Patient Instructions (Signed)
[facility] bubble packing:  Using Warfarin 2.5mg tablet now Continue warfarin 1 tablet daily except 2 tablets on Thursdays.  Recheck in 6 wks. Call [phone] for any questions or medication changes 

## 2020-02-26 DIAGNOSIS — E782 Mixed hyperlipidemia: Secondary | ICD-10-CM | POA: Diagnosis not present

## 2020-02-26 DIAGNOSIS — H9193 Unspecified hearing loss, bilateral: Secondary | ICD-10-CM | POA: Diagnosis not present

## 2020-02-26 DIAGNOSIS — I739 Peripheral vascular disease, unspecified: Secondary | ICD-10-CM | POA: Diagnosis not present

## 2020-02-26 DIAGNOSIS — Z1389 Encounter for screening for other disorder: Secondary | ICD-10-CM | POA: Diagnosis not present

## 2020-02-26 DIAGNOSIS — E785 Hyperlipidemia, unspecified: Secondary | ICD-10-CM | POA: Diagnosis not present

## 2020-02-26 DIAGNOSIS — I110 Hypertensive heart disease with heart failure: Secondary | ICD-10-CM | POA: Diagnosis not present

## 2020-02-26 DIAGNOSIS — Z0000 Encounter for general adult medical examination without abnormal findings: Secondary | ICD-10-CM | POA: Diagnosis not present

## 2020-02-26 DIAGNOSIS — J449 Chronic obstructive pulmonary disease, unspecified: Secondary | ICD-10-CM | POA: Diagnosis not present

## 2020-02-26 DIAGNOSIS — Z0001 Encounter for general adult medical examination with abnormal findings: Secondary | ICD-10-CM | POA: Diagnosis not present

## 2020-02-26 DIAGNOSIS — I700 Atherosclerosis of aorta: Secondary | ICD-10-CM | POA: Diagnosis not present

## 2020-02-26 DIAGNOSIS — I10 Essential (primary) hypertension: Secondary | ICD-10-CM | POA: Diagnosis not present

## 2020-03-06 ENCOUNTER — Other Ambulatory Visit: Payer: Self-pay | Admitting: Cardiology

## 2020-03-07 ENCOUNTER — Telehealth: Payer: Self-pay | Admitting: Cardiology

## 2020-03-07 MED ORDER — NITROGLYCERIN 0.4 MG SL SUBL: SUBLINGUAL_TABLET | SUBLINGUAL | 3 refills | Status: DC

## 2020-03-07 NOTE — Telephone Encounter (Signed)
refiled

## 2020-03-07 NOTE — Telephone Encounter (Signed)
New message     *STAT* If patient is at the pharmacy, call can be transferred to refill team.   1. Which medications need to be refilled? (please list name of each medication and dose if known)  nitro  2. Which pharmacy/location (including street and city if local pharmacy) is medication to be sent to? [facility]  3. Do they need a 30 day or 90 day supply? [place]

## 2020-03-10 DIAGNOSIS — I4891 Unspecified atrial fibrillation: Secondary | ICD-10-CM | POA: Diagnosis not present

## 2020-03-10 DIAGNOSIS — E7849 Other hyperlipidemia: Secondary | ICD-10-CM | POA: Diagnosis not present

## 2020-03-10 DIAGNOSIS — I5022 Chronic systolic (congestive) heart failure: Secondary | ICD-10-CM | POA: Diagnosis not present

## 2020-03-10 DIAGNOSIS — J449 Chronic obstructive pulmonary disease, unspecified: Secondary | ICD-10-CM | POA: Diagnosis not present

## 2020-03-10 DIAGNOSIS — I110 Hypertensive heart disease with heart failure: Secondary | ICD-10-CM | POA: Diagnosis not present

## 2020-03-25 ENCOUNTER — Ambulatory Visit (INDEPENDENT_AMBULATORY_CARE_PROVIDER_SITE_OTHER): Payer: Medicare Other | Admitting: *Deleted

## 2020-03-25 DIAGNOSIS — I4891 Unspecified atrial fibrillation: Secondary | ICD-10-CM

## 2020-03-25 DIAGNOSIS — Z5181 Encounter for therapeutic drug level monitoring: Secondary | ICD-10-CM | POA: Diagnosis not present

## 2020-03-25 LAB — POCT INR: INR: 2 (ref 2.0–3.0)

## 2020-03-25 NOTE — Patient Instructions (Signed)
[facility] bubble packing:  Using Warfarin 2.5mg  tablet now Continue warfarin 1 tablet daily except 2 tablets on Thursdays.  Recheck in 6 wks. Call [phone] for any questions or medication changes

## 2020-03-26 NOTE — [place] Notes (Signed)
Cardiology Office Note    Date:  04/01/2020   ID:  Vincent Carter, Vincent Carter [birth year], MRN [id number]   PCP:  [name], [place]  Cardiologist:  Carlyle Dolly, MD  Advanced Practice Provider:  No care team member to display Electrophysiologist:  None   [phone]   Chief Complaint  Patient presents with  . Follow-up    History of Present Illness:  Vincent Carter is a 75 y.o. male with history of CAD   S/P CABG in 1997, redo in 2006 ejection fraction in 2006 reported at 40-45%. Repeat echo 03/2018 LVEF 40-45%. Also has HTN, HLD, chronic systolic CHF, PAF and NSVT up to 20 beats on admission 03/2018 treated with amiodarone  For 2 weeks and then stopped NST 08/2018 high risk due to decreased LVEF extensive scar mild ischemia.  Last saw Dr. Harl Bowie 01/30/19 and still had rash off lasix and doing well without fluid overload.   Patient comes in for yearly f/u accompanied by his granddaughter. Has chronic DOE.  He smoked for 60 years and quit 2 years ago.  Has been using someone else's inhaler and says it really helps.  Occasional chest pain if busy doing something relieved with rest. Denies palpitations. Had lab work one month ago and was told him his kidneys weren't doing well and was referred to renal but has not heard from them yet. Lives alone and eats out twice daily-fast food.   Past Medical History:  Diagnosis Date  . Arthritis   . ASCVD (arteriosclerotic cardiovascular disease) cardiologist--  is now dr Roderic Palau branch (was dr [name])    CABG in 1997 -- LIMA to LAD;  cath 07-17-2012 PCI and BMStenting;   RE-DO CABG X4  in  07-24-2004 three-vessel disease; cath in 08-24-2004 post CABG ef 40-45% w/ anteroapical hypokinesis; occlusion SVG  to  RI; patent RIMA to LAD; unsuccessful PCI in totally occluded RI graft.;  last myoview 09-09-2013, no ischemia, moderate inferior wall defect, ef 49%  . CHF (congestive heart failure) ([place])   .  Chronic obstructive pulmonary disease ([place])   . Diverticulosis of colon   . Euthyroid goiter    with low TSH   . History of colon polyps    hyperplastic 2011  . History of diverticulitis   . History of kidney stones   . History of ST elevation myocardial infarction (STEMI)    07-17-2004   w/ extensive RV infact per cath report  . HOH (hard of hearing)    no hearing aid  . Hypertension   . Low TSH level    Clinically euthyroid  . Peripheral vascular disease (HCC)    Decreased distal pulses and abnormal Doppler  . Prostate cancer [facility]) urologist--  dr Jenny Reichmann wrenn   T1c, Gleason 3+4=7,  PSA 4.71,  vol 38cc  . Ptosis    Left, secondary to injury  . S/p bare metal coronary artery stent    x3  to RCA  07-17-2004    Past Surgical History:  Procedure Laterality Date  . APPENDECTOMY  1973  . CARDIAC CATHETERIZATION  08-24-2004  dr Lia Foyer   post re-do CABG/  Unsuccessful attempt PCI of clotted SVG to RI/  anteroapical segment severely hypokinetic,  ef 40-45%/  fresh occlusion SVG to RI,  continued patency of SVG to PAD and RIMA to LAD /   dLM 80%  . CARDIOVASCULAR STRESS TEST  09-09-2013  dr Roderic Palau branch   no reversible ischemia,  moderately large defect noted in the inferior wall extending from  mid ventricle to apex also involves true ventricle apex and anteroapical wall/  hypokinesis of inferior wall in region of prior infarct/scarring/  LVEF 49%  . CATARACT EXTRACTION W/ INTRAOCULAR LENS  IMPLANT, BILATERAL  2006  . COLONOSCOPY W/ POLYPECTOMY  12-26-2009  . CORONARY ANGIOPLASTY WITH STENT PLACEMENT  07-17-2004   dr Sabino Snipes   PCI w/ BMS x3 to RCA and balloon pump placement/  LVSF after revascularization , ef 65% w/ mild inferior hypokinesis/  dLM  70% extending into origins of LAD (70% ostial, 80% mid, D1 branch 90% ostial)  CFX (80% ostial OM, 70% after first branch) /   99% mRCA stented and no residual;  remains 80% origin of posterior LV branch/  most apical portion of PDA  was embolized   . CORONARY ARTERY BYPASS GRAFT  1997   dr Merleen Nicely   LIMA to LAD  . CORONARY ARTERY BYPASS GRAFT  07-24-2004  dr gerhardt   RIMA to LAD,  SVG to OM,  SVG to PDA,  SVG to RI  . EYE SURGERY Left 1996   repair ocular injury  . ORIF RIGHT TIB-FIB FX  1990's  . PROSTATE BIOPSY    . RADIOACTIVE SEED IMPLANT N/A 05/02/2014   Procedure: RADIOACTIVE SEED IMPLANT;  Surgeon: Irine Seal, MD;  Location: [facility];  Service: Urology;  Laterality: N/A;  . TONSILLECTOMY  age 17  . TOTAL HIP ARTHROPLASTY Left 1983   RECONSTRUCTION/ THA/  SKIN AND BONE GRAFTS--  pt fell from 5 stories off [place]  . TRANSTHORACIC ECHOCARDIOGRAM  40-10-2723   diastolic dysfunction/  ef 45-50%/  mild AV calcification without stenosis,  mild AR/  mild LAE and RAE/  trivial MR and TR      Current Medications: Current Meds  Medication Sig  . albuterol (PROVENTIL) (2.5 MG/3ML) 0.083% nebulizer solution Take 3 mLs (2.5 mg total) by nebulization every 4 (four) hours as needed for shortness of breath.  Marland Kitchen azithromycin (ZITHROMAX) 250 MG tablet Take 250 mg by mouth daily.  . budesonide-formoterol (SYMBICORT) 80-4.5 MCG/ACT inhaler Inhale 2 puffs into the lungs 2 (two) times daily.  Marland Kitchen Caffeine-Magnesium Salicylate (DIUREX PO) Take 50 mg by mouth 3 (three) times daily.  . carvedilol (COREG) 12.5 MG tablet TAKE 1.5 TABLETS BY MOUTH TWICE DAILY.  Marland Kitchen dextromethorphan-guaiFENesin (MUCINEX DM) 30-600 MG 12hr tablet Take 1 tablet by mouth 2 (two) times daily.  Marland Kitchen ethacrynic acid (EDECRIN) 25 MG tablet Take 25 mg by mouth.  . fluticasone (FLONASE) 50 MCG/ACT nasal spray Place 1 spray into both nostrils 2 (two) times daily.  Marland Kitchen lisinopril (ZESTRIL) 2.5 MG tablet TAKE ONE TABLET BY MOUTH DAILY.  . Nebulizer MISC Please provide 1 nebulizer machine and all supplies. Diagnosis: J44.1, Office number: [phone]  . nitroGLYCERIN (NITROSTAT) 0.4 MG SL tablet PLACE 1 TABLET UNDER TONGUE EVERY 5 MINUTES AS  NEEDED FOR CHEST PAIN.  Marland Kitchen Omega-3 Fatty Acids (FISH OIL) 300 MG CAPS Take 2 capsules by mouth 2 (two) times daily.  Marland Kitchen warfarin (COUMADIN) 2.5 MG tablet Continue 1 tablet daily except 2 tablets on Thursdays or as directed     Allergies:   Codeine and Statins   Social History   Socioeconomic History  . Marital status: Widowed    Spouse name: Not on file  . Number of children: Not on file  . Years of education: Not on file  . Highest education level: Not on file  Occupational History  . Occupation: Retired  Tobacco Use  . Smoking status: Former Smoker    Packs/day: 1.00    Years: 55.00    Pack years: 55.00    Types: Cigarettes    Start date: 08/13/1958    Quit date: 04/12/2018    Years since quitting: 1.9  . Smokeless tobacco: Never Used  Vaping Use  . Vaping Use: Former  Substance and Sexual Activity  . Alcohol use: No    Alcohol/week: 0.0 standard drinks  . Drug use: No  . Sexual activity: Not on file  Other Topics Concern  . Not on file  Social History Narrative   Widowed   No regular exercise   Social Determinants of Health   Financial Resource Strain: Not on file  Food Insecurity: Not on file  Transportation Needs: Not on file  Physical Activity: Not on file  Stress: Not on file  Social Connections: Not on file     Family History:  The patient's   family history includes Cancer in his brother; Clotting disorder in his mother; Heart attack in his mother; Lung cancer in his father.   ROS:   Please see the history of present illness.    ROS All other systems reviewed and are negative.   PHYSICAL EXAM:   VS:  BP 119/80   Pulse 66   Resp 20   Ht 5\' 2"  (1.575 m)   Wt 148 lb (67.1 kg)   SpO2 97%   BMI 27.07 kg/m   Physical Exam  GEN: Well nourished, well developed, in no acute distress  Neck: no JVD, carotid bruits, or masses Cardiac: Irregular; no murmurs, rubs, or gallops  Respiratory: Decreased breath sounds but clear to auscultation bilaterally, normal  work of breathing GI: soft, nontender, nondistended, + BS Ext: without cyanosis, clubbing, or edema, Good distal pulses bilaterally Neuro:  Alert and Oriented x 3 Psych: euthymic mood, full affect  Wt Readings from Last 3 Encounters:  04/01/20 148 lb (67.1 kg)  01/30/19 144 lb (65.3 kg)  08/09/18 149 lb (67.6 kg)      Studies/Labs Reviewed:   EKG:  EKG is  ordered today.  The ekg ordered today demonstrates atrial fibrillation at 71 bpm poor R wave progression anteriorly nonspecific ST-T wave changes, no acute change  Recent Labs: No results found for requested labs within last 8760 hours.   Lipid Panel    Component Value Date/Time   CHOL 97 03/24/2018 1235   TRIG 92 03/24/2018 1235   HDL 31 (L) 03/24/2018 1235   CHOLHDL 3.1 03/24/2018 1235   VLDL 18 03/24/2018 1235   LDLCALC 48 03/24/2018 1235    Additional studies/ records that were reviewed today include:  NST 08/2018  There was no ST segment deviation noted during stress.  The left ventricular ejection fraction is moderately decreased (32%).  Findings consistent with prior moderate sized apical and large inferior myocardial infarction. There a small to medium size anterior defect with mild peri-infarct ischemia.  This is a high risk study. Risked primarily based on decreased LVEF and extensive scar. There is mild current ischemia.    Echo 03/24/18 IMPRESSIONS     1. The left ventricle has mild-moderately reduced systolic function, with  an ejection fraction of 40-45%. The cavity size was normal. There is mild  concentric left ventricular hypertrophy. Left ventricular diastolic  function could not be evaluated  secondary to atrial fibrillation. Indeterminate filling pressures Left  ventricular diffuse hypokinesis.   2. The  mitral valve is grossly normal. There is mild mitral annular  calcification present.   3. The tricuspid valve is grossly normal.   4. The aortic valve is tricuspid Mild thickening of the  aortic valve  Sclerosis without any evidence of stenosis of the aortic valve. Aortic  valve regurgitation is trivial by color flow Doppler.   5. The aortic root is normal in size and structure.    Risk Assessment/Calculations:    [id number] Score = 5  {Click here to calculate score.  REFRESH note before signing.       :[id number]}NVBT indicates a 7.2% annual risk of stroke. The patient's score is based upon: CHF History: Yes HTN History: Yes Diabetes History: No Stroke History: No Vascular Disease History: Yes Age Score: 2 Gender Score: 0        ASSESSMENT:    1. Coronary artery disease involving coronary bypass graft of native heart without angina pectoris   2. Ischemic cardiomyopathy   3. Chronic systolic CHF (congestive heart failure) (HCC)   4. Paroxysmal atrial fibrillation ([place])   5. NSVT (nonsustained ventricular tachycardia) (HCC)   6. Renal insufficiency   7. DOE (dyspnea on exertion)      PLAN:  In order of problems listed above:  CAD status post CABG 1997, redo 2006 NST 08/2018 mild ischemia see above high risk due to decreased LVEF.  Has occasional chest tightness if doing heavier exertion relieved with rest.  This occurs rarely.  Will not make any changes today.  Ischemic cardiomyopathy ejection fraction 40 to 45% on echo 03/2018 overall compensated today.  No heart failure on exam.  He does eat out twice a day and frequents fast foods.  2 g sodium diet recommended.  Systolic CHF no CHF on exam  PAF and NSVT up to 20 beats admission 03/2018 treated with amiodarone for 2 weeks then stopped NST 08/2018 high risk due to decreased LVEF with extensive scar only mild ischemia.-Denies palpitations.  In atrial fibrillation with rate control today.  On Coumadin.  Renal insufficiency according to patient and referred to renal by PCP.  Will request labs.  COPD over 60 pack years of smoking and chronic dyspnea on exertion.  Currently getting relief from someone else's  inhaler.  Will refer to pulmonary.   Shared Decision Making/Informed Consent        Medication Adjustments/Labs and Tests Ordered: Current medicines are reviewed at length with the patient today.  Concerns regarding medicines are outlined above.  Medication changes, Labs and Tests ordered today are listed in the Patient Instructions below. Patient Instructions  Medication Instructions:  Your physician recommends that you continue on your current medications as directed. Please refer to the Current Medication list given to you today.  *If you need a refill on your cardiac medications before your next appointment, please call your pharmacy*   Lab Work: None today  If you have labs (blood work) drawn today and your tests are completely normal, you will receive your results only by: Marland Kitchen MyChart Message (if you have MyChart) OR . A paper copy in the mail If you have any lab test that is abnormal or we need to change your treatment, we will call you to review the results.   Testing/Procedures: None today   Follow-Up: At [facility], you and your health needs are our priority.  As part of our continuing mission to provide you with exceptional heart care, we have created designated Provider Care Teams.  These Care Teams include your  primary Cardiologist (physician) and Advanced Practice Providers (APPs -  Physician Assistants and Nurse Practitioners) who all work together to provide you with the care you need, when you need it.  We recommend signing up for the patient portal called "MyChart".  Sign up information is provided on this After Visit Summary.  MyChart is used to connect with patients for Virtual Visits (Telemedicine).  Patients are able to view lab/test results, encounter notes, upcoming appointments, etc.  Non-urgent messages can be sent to your provider as well.   To learn more about what you can do with MyChart, go to [email or web address].    Your next appointment:   6  month(s)  The format for your next appointment:   In Person  Provider:   Carlyle Dolly, MD   Other Instructions We have referred you to pulmonary. They will call you to make an appointment.     Sumner Boast, PA-C  04/01/2020 1:15 PM    [facility] [address], [place], [place]  [address] Phone: [phone]; Fax: [phone]

## 2020-04-01 ENCOUNTER — Other Ambulatory Visit: Payer: Self-pay

## 2020-04-01 ENCOUNTER — Ambulatory Visit: Payer: Medicare Other | Admitting: Physician Assistant

## 2020-04-01 ENCOUNTER — Encounter: Payer: Self-pay | Admitting: Physician Assistant

## 2020-04-01 VITALS — BP 119/80 | HR 66 | Resp 20 | Ht 62.0 in | Wt 148.0 lb

## 2020-04-01 DIAGNOSIS — I472 Ventricular tachycardia: Secondary | ICD-10-CM

## 2020-04-01 DIAGNOSIS — R0600 Dyspnea, unspecified: Secondary | ICD-10-CM | POA: Diagnosis not present

## 2020-04-01 DIAGNOSIS — I5022 Chronic systolic (congestive) heart failure: Secondary | ICD-10-CM | POA: Diagnosis not present

## 2020-04-01 DIAGNOSIS — I255 Ischemic cardiomyopathy: Secondary | ICD-10-CM | POA: Diagnosis not present

## 2020-04-01 DIAGNOSIS — I480 Paroxysmal atrial fibrillation: Secondary | ICD-10-CM

## 2020-04-01 DIAGNOSIS — I25810 Atherosclerosis of coronary artery bypass graft(s) without angina pectoris: Secondary | ICD-10-CM | POA: Diagnosis not present

## 2020-04-01 DIAGNOSIS — N289 Disorder of kidney and ureter, unspecified: Secondary | ICD-10-CM | POA: Diagnosis not present

## 2020-04-01 DIAGNOSIS — I4729 Other ventricular tachycardia: Secondary | ICD-10-CM

## 2020-04-01 DIAGNOSIS — R0609 Other forms of dyspnea: Secondary | ICD-10-CM

## 2020-04-01 NOTE — Patient Instructions (Signed)
Medication Instructions:  Your physician recommends that you continue on your current medications as directed. Please refer to the Current Medication list given to you today.  *If you need a refill on your cardiac medications before your next appointment, please call your pharmacy*   Lab Work: None today  If you have labs (blood work) drawn today and your tests are completely normal, you will receive your results only by: Marland Kitchen MyChart Message (if you have MyChart) OR . A paper copy in the mail If you have any lab test that is abnormal or we need to change your treatment, we will call you to review the results.   Testing/Procedures: None today   Follow-Up: At [facility], you and your health needs are our priority.  As part of our continuing mission to provide you with exceptional heart care, we have created designated Provider Care Teams.  These Care Teams include your primary Cardiologist (physician) and Advanced Practice Providers (APPs -  Physician Assistants and Nurse Practitioners) who all work together to provide you with the care you need, when you need it.  We recommend signing up for the patient portal called "MyChart".  Sign up information is provided on this After Visit Summary.  MyChart is used to connect with patients for Virtual Visits (Telemedicine).  Patients are able to view lab/test results, encounter notes, upcoming appointments, etc.  Non-urgent messages can be sent to your provider as well.   To learn more about what you can do with MyChart, go to [email or web address].    Your next appointment:   6 month(s)  The format for your next appointment:   In Person  Provider:   Carlyle Dolly, MD   Other Instructions We have referred you to pulmonary. They will call you to make an appointment.

## 2020-04-14 DIAGNOSIS — N1832 Chronic kidney disease, stage 3b: Secondary | ICD-10-CM | POA: Diagnosis not present

## 2020-04-14 DIAGNOSIS — I509 Heart failure, unspecified: Secondary | ICD-10-CM | POA: Diagnosis not present

## 2020-04-14 DIAGNOSIS — I129 Hypertensive chronic kidney disease with stage 1 through stage 4 chronic kidney disease, or unspecified chronic kidney disease: Secondary | ICD-10-CM | POA: Diagnosis not present

## 2020-04-24 ENCOUNTER — Other Ambulatory Visit: Payer: Self-pay | Admitting: Cardiology

## 2020-05-06 ENCOUNTER — Ambulatory Visit (INDEPENDENT_AMBULATORY_CARE_PROVIDER_SITE_OTHER): Payer: Medicare Other | Admitting: *Deleted

## 2020-05-06 DIAGNOSIS — Z5181 Encounter for therapeutic drug level monitoring: Secondary | ICD-10-CM | POA: Diagnosis not present

## 2020-05-06 DIAGNOSIS — I4891 Unspecified atrial fibrillation: Secondary | ICD-10-CM

## 2020-05-06 LAB — POCT INR: INR: 2.3 (ref 2.0–3.0)

## 2020-05-06 NOTE — Patient Instructions (Signed)
[facility] bubble packing:  Using Warfarin 2.5mg tablet now Continue warfarin 1 tablet daily except 2 tablets on Thursdays.  Recheck in 6 wks. Call [phone] for any questions or medication changes 

## 2020-05-10 DIAGNOSIS — E7849 Other hyperlipidemia: Secondary | ICD-10-CM | POA: Diagnosis not present

## 2020-05-10 DIAGNOSIS — I110 Hypertensive heart disease with heart failure: Secondary | ICD-10-CM | POA: Diagnosis not present

## 2020-05-10 DIAGNOSIS — I5022 Chronic systolic (congestive) heart failure: Secondary | ICD-10-CM | POA: Diagnosis not present

## 2020-05-10 DIAGNOSIS — I4891 Unspecified atrial fibrillation: Secondary | ICD-10-CM | POA: Diagnosis not present

## 2020-05-16 DIAGNOSIS — R319 Hematuria, unspecified: Secondary | ICD-10-CM | POA: Diagnosis not present

## 2020-05-27 ENCOUNTER — Other Ambulatory Visit: Payer: Self-pay | Admitting: Cardiology

## 2020-06-06 ENCOUNTER — Other Ambulatory Visit: Payer: Self-pay

## 2020-06-06 ENCOUNTER — Ambulatory Visit (HOSPITAL_COMMUNITY)
Admission: RE | Admit: 2020-06-06 | Discharge: 2020-06-06 | Disposition: A | Discharge status: Home / Self Care | Payer: Medicare Other | Source: Ambulatory Visit | Attending: Pulmonary Disease | Admitting: Pulmonary Disease

## 2020-06-06 ENCOUNTER — Ambulatory Visit: Payer: Medicare Other | Admitting: Pulmonary Disease

## 2020-06-06 ENCOUNTER — Encounter: Payer: Self-pay | Admitting: Pulmonary Disease

## 2020-06-06 VITALS — BP 110/70 | HR 89 | Temp 97.1°F | Ht 60.0 in | Wt 157.4 lb

## 2020-06-06 DIAGNOSIS — J90 Pleural effusion, not elsewhere classified: Secondary | ICD-10-CM | POA: Diagnosis not present

## 2020-06-06 DIAGNOSIS — R0600 Dyspnea, unspecified: Secondary | ICD-10-CM

## 2020-06-06 DIAGNOSIS — R059 Cough, unspecified: Secondary | ICD-10-CM | POA: Diagnosis not present

## 2020-06-06 DIAGNOSIS — R053 Chronic cough: Secondary | ICD-10-CM | POA: Insufficient documentation

## 2020-06-06 DIAGNOSIS — I517 Cardiomegaly: Secondary | ICD-10-CM | POA: Diagnosis not present

## 2020-06-06 DIAGNOSIS — R0609 Other forms of dyspnea: Secondary | ICD-10-CM

## 2020-06-06 DIAGNOSIS — J9811 Atelectasis: Secondary | ICD-10-CM | POA: Diagnosis not present

## 2020-06-06 MED ORDER — TRELEGY ELLIPTA 100-62.5-25 MCG/INH IN AEPB: 1.0000 | INHALATION_SPRAY | Freq: Every day | RESPIRATORY_TRACT | 0 refills | Status: DC

## 2020-06-06 MED ORDER — TRELEGY ELLIPTA 100-62.5-25 MCG/INH IN AEPB: 1.0000 | INHALATION_SPRAY | Freq: Every day | RESPIRATORY_TRACT | 5 refills | Status: DC

## 2020-06-06 NOTE — Patient Instructions (Signed)
Will arrange for chest xray and pulmonary function test  Trelegy one puff daily, and rinse your mouth after each use  Albuterol two puffs every 4 to 6 hours as needed for cough, wheeze, chest congestion, or shortness of breath  Follow up in 2 weeks

## 2020-06-06 NOTE — [place] Notes (Signed)
[facility]  Chief Complaint  Patient presents with  . Consult    Intermittent productive cough with white phlegm     Constitutional:  BP 110/70 (BP Location: Left Arm, Cuff Size: Normal)   Pulse 89   Temp (!) 97.1 F (36.2 C) (Temporal)   Ht 5' (1.524 m)   Wt 157 lb 6.4 oz (71.4 kg)   SpO2 97% Comment: Room air  BMI 30.74 kg/m   Past Medical History:  OA, CAD s/p stent and CABG, CHF, Diverticulosis, Goiter, Colon polyps, Nephrolithiasis, HTN, PAD, Prostate cancer, Ptosis Lt eye, A fib  Past Surgical History:  He  has a past surgical history that includes Colonoscopy w/ polypectomy (12-26-2009); Prostate biopsy; Coronary artery bypass graft (1997   dr Merleen Nicely); Coronary artery bypass graft (07-24-2004  dr gerhardt); Total hip arthroplasty (Left, 1983); transthoracic echocardiogram (03-01-2013); Cardiovascular stress test (09-09-2013  dr Roderic Palau branch); Coronary angioplasty with stent (07-17-2004   dr Sabino Snipes); Cardiac catheterization (08-24-2004  dr Lia Foyer); Tonsillectomy (age 46); Appendectomy (1973); Eye surgery (Left, 1996); Cataract extraction w/ intraocular lens  implant, bilateral (2006); ORIF RIGHT TIB-FIB FX (1990's); and Radioactive seed implant (N/A, 05/02/2014).  Brief Summary:  Vincent Carter is a 75 y.o. male former smoker with COPD.       Subjective:   He is here with his daughter.  He smoked 2 packs per day for year and quit about 2 years ago.  He worked in [employer] and had lots of dust exposure.  He was told he has COPD, but never had PFT.  He has used albuterol and this helps.  He gets cough, intermittent wheezing, and chest congestion.  Brings up clear sputum.  No hemoptysis.  Breathing okay at night.  Gets winded if he walks too fast.  No history of pneumonia or TB.  No animal/bird exposures.  His father had COPD.  Physical Exam:   Appearance - well kempt   ENMT - no sinus tenderness, no  oral exudate, no LAN, Mallampati 3 airway, no stridor, edentulous  Respiratory - equal breath sounds bilaterally, no wheezing or rales  CV - s1s2 regular rate and rhythm, no murmurs  Ext - no clubbing, no edema  Skin - no rashes  Psych - normal mood and affect   Pulmonary testing:    Chest Imaging:    Cardiac Tests:   Echo 03/24/18 >> EF 40 to 45%, mild LVH  Social History:  He  reports that he quit smoking about 2 years ago. His smoking use included cigarettes. He started smoking about 61 years ago. He has a 55.00 pack-year smoking history. He has never used smokeless tobacco. He reports that he does not drink alcohol and does not use drugs.  Family History:  His family history includes Cancer in his brother; Clotting disorder in his mother; Heart attack in his mother; Lung cancer in his father.     Assessment/Plan:   Chronic production cough and dyspnea with extensive history of tobacco smoking. - certainly could have COPD - will arrange for chest xray and pulmonary function test to further assess - previous CBC showed elevation in eosinophil count which would portend favorable response to ICS - will have him try trelegy one puff daily; sample given and technique demonstrated - continue prn albuterol  CAD, ischemic cardiomyopathy with chronic systolic CHF, Paroxysmal atrial fibrillation, Nonsustained ventricular tachycardia. - followed by Dr. Carlyle Dolly with [facility]  Time Spent Involved in  Patient Care on Day of Examination:  35 minutes  Follow up:  Patient Instructions  Will arrange for chest xray and pulmonary function test  Trelegy one puff daily, and rinse your mouth after each use  Albuterol two puffs every 4 to 6 hours as needed for cough, wheeze, chest congestion, or shortness of breath  Follow up in 2 weeks   Medication List:   Allergies as of 06/06/2020      Reactions   Codeine Nausea Only   Pill form of codeine causes nausea    Statins Other (See Comments)   Myalgia       Medication List       Accurate as of Jun 06, 2020 11:27 AM. If you have any questions, ask your nurse or doctor.        STOP taking these medications   azithromycin 250 MG tablet Commonly known as: ZITHROMAX Stopped by: Chesley Mires, MD   budesonide-formoterol 80-4.5 MCG/ACT inhaler Commonly known as: Symbicort Stopped by: Chesley Mires, MD   ethacrynic acid 25 MG tablet Commonly known as: EDECRIN Stopped by: Chesley Mires, MD     TAKE these medications   albuterol (2.5 MG/3ML) 0.083% nebulizer solution Commonly known as: PROVENTIL Take 3 mLs (2.5 mg total) by nebulization every 4 (four) hours as needed for shortness of breath.   carvedilol 12.5 MG tablet Commonly known as: COREG TAKE 1.5 TABLETS BY MOUTH TWICE DAILY.   dextromethorphan-guaiFENesin 30-600 MG 12hr tablet Commonly known as: MUCINEX DM Take 1 tablet by mouth 2 (two) times daily. What changed:   when to take this  reasons to take this   DIUREX PO Take 50 mg by mouth 3 (three) times daily.   Fish Oil 300 MG Caps Take 2 capsules by mouth 2 (two) times daily.   fluticasone 50 MCG/ACT nasal spray Commonly known as: FLONASE Place 1 spray into both nostrils 2 (two) times daily.   hydrochlorothiazide 25 MG tablet Commonly known as: HYDRODIURIL Take 1 tablet by mouth daily.   lisinopril 2.5 MG tablet Commonly known as: ZESTRIL TAKE ONE TABLET BY MOUTH DAILY.   Nebulizer Misc Please provide 1 nebulizer machine and all supplies. Diagnosis: J44.1, Office number: [phone]   nitroGLYCERIN 0.4 MG SL tablet Commonly known as: NITROSTAT PLACE 1 TABLET UNDER TONGUE EVERY 5 MINUTES AS NEEDED FOR CHEST PAIN.   Trelegy Ellipta 100-62.5-25 MCG/INH Aepb Generic drug: Fluticasone-Umeclidin-Vilant Inhale 1 puff into the lungs daily. Started by: Chesley Mires, MD   Vitamin D3 1.25 MG (50000 UT) Caps Take 1 capsule by mouth daily.   warfarin 2.5 MG  tablet Commonly known as: COUMADIN Take as directed by the anticoagulation clinic. If you are unsure how to take this medication, talk to your nurse or doctor. Original instructions: Continue 1 tablet daily except 2 tablets on Thursdays or as directed       Signature:  Chesley Mires, MD [place] Pager - [phone] 06/06/2020, 11:27 AM

## 2020-06-06 NOTE — Addendum Note (Signed)
Addended by: Merrilee Seashore on: 06/06/2020 05:15 PM   Modules accepted: Orders

## 2020-06-16 ENCOUNTER — Other Ambulatory Visit: Payer: Self-pay

## 2020-06-16 ENCOUNTER — Other Ambulatory Visit (HOSPITAL_COMMUNITY)
Admission: RE | Admit: 2020-06-16 | Discharge: 2020-06-16 | Disposition: A | Discharge status: Home / Self Care | Payer: Medicare Other | Source: Ambulatory Visit | Attending: Pulmonary Disease | Admitting: Pulmonary Disease

## 2020-06-16 DIAGNOSIS — Z20822 Contact with and (suspected) exposure to covid-19: Secondary | ICD-10-CM | POA: Diagnosis not present

## 2020-06-16 DIAGNOSIS — Z01812 Encounter for preprocedural laboratory examination: Secondary | ICD-10-CM | POA: Diagnosis not present

## 2020-06-17 ENCOUNTER — Ambulatory Visit (INDEPENDENT_AMBULATORY_CARE_PROVIDER_SITE_OTHER): Payer: Medicare Other | Admitting: *Deleted

## 2020-06-17 ENCOUNTER — Ambulatory Visit (HOSPITAL_COMMUNITY)
Admission: RE | Admit: 2020-06-17 | Discharge: 2020-06-17 | Disposition: A | Discharge status: Home / Self Care | Payer: Medicare Other | Source: Ambulatory Visit | Attending: Pulmonary Disease | Admitting: Pulmonary Disease

## 2020-06-17 DIAGNOSIS — I4891 Unspecified atrial fibrillation: Secondary | ICD-10-CM | POA: Diagnosis not present

## 2020-06-17 DIAGNOSIS — Z5181 Encounter for therapeutic drug level monitoring: Secondary | ICD-10-CM

## 2020-06-17 DIAGNOSIS — R0600 Dyspnea, unspecified: Secondary | ICD-10-CM | POA: Diagnosis not present

## 2020-06-17 DIAGNOSIS — R053 Chronic cough: Secondary | ICD-10-CM | POA: Diagnosis not present

## 2020-06-17 DIAGNOSIS — R0609 Other forms of dyspnea: Secondary | ICD-10-CM

## 2020-06-17 LAB — PULMONARY FUNCTION TEST
DL/VA % pred: 71 %
DL/VA: 2.93 ml/min/mmHg/L
DLCO unc % pred: 61 %
DLCO unc: 11.89 ml/min/mmHg
FEF 25-75 Post: 1.36 L/sec
FEF 25-75 Pre: 1.36 L/sec
FEF2575-%Change-Post: 0 %
FEF2575-%Pred-Post: 92 %
FEF2575-%Pred-Pre: 92 %
FEV1-%Change-Post: 0 %
FEV1-%Pred-Post: 85 %
FEV1-%Pred-Pre: 85 %
FEV1-Post: 1.76 L
FEV1-Pre: 1.77 L
FEV1FVC-%Change-Post: 5 %
FEV1FVC-%Pred-Pre: 103 %
FEV6-%Change-Post: -4 %
FEV6-%Pred-Post: 82 %
FEV6-%Pred-Pre: 86 %
FEV6-Post: 2.22 L
FEV6-Pre: 2.34 L
FEV6FVC-%Change-Post: 1 %
FEV6FVC-%Pred-Post: 108 %
FEV6FVC-%Pred-Pre: 107 %
FVC-%Change-Post: -6 %
FVC-%Pred-Post: 76 %
FVC-%Pred-Pre: 80 %
FVC-Post: 2.22 L
FVC-Pre: 2.37 L
Post FEV1/FVC ratio: 79 %
Post FEV6/FVC ratio: 100 %
Pre FEV1/FVC ratio: 75 %
Pre FEV6/FVC Ratio: 99 %
RV % pred: 94 %
RV: 2.03 L
TLC % pred: 81 %
TLC: 4.4 L

## 2020-06-17 LAB — SARS CORONAVIRUS 2 (TAT 6-24 HRS): SARS Coronavirus 2: NEGATIVE

## 2020-06-17 LAB — POCT INR: INR: 1.6 — AB (ref 2.0–3.0)

## 2020-06-17 MED ORDER — ALBUTEROL SULFATE (2.5 MG/3ML) 0.083% IN NEBU
2.5000 mg | INHALATION_SOLUTION | Freq: Once | RESPIRATORY_TRACT | Status: AC
  Administered 2020-06-17: 2.5 mg via RESPIRATORY_TRACT

## 2020-06-17 NOTE — Patient Instructions (Signed)
[facility] bubble packing:  Using Warfarin 2.5mg  tablet now Take warfarin extra 1 1/2 tablets tonight then resume 1 tablet daily except 2 tablets on Thursdays.  Recheck in 3 wks. Call [phone] for any questions or medication changes

## 2020-06-24 NOTE — [place] Notes (Signed)
Due to multiple attempts to reach patient by phone, letter printed and sent to please call for results.

## 2020-06-27 ENCOUNTER — Encounter: Payer: Self-pay | Admitting: Pulmonary Disease

## 2020-06-27 ENCOUNTER — Other Ambulatory Visit: Payer: Self-pay

## 2020-06-27 ENCOUNTER — Ambulatory Visit: Payer: Medicare Other | Admitting: Pulmonary Disease

## 2020-06-27 ENCOUNTER — Telehealth: Payer: Self-pay | Admitting: Pulmonary Disease

## 2020-06-27 VITALS — BP 94/64 | HR 62 | Temp 97.4°F | Ht 62.0 in | Wt 154.2 lb

## 2020-06-27 DIAGNOSIS — R053 Chronic cough: Secondary | ICD-10-CM

## 2020-06-27 MED ORDER — IPRATROPIUM-ALBUTEROL 0.5-2.5 (3) MG/3ML IN SOLN: 3.0000 mL | Freq: Four times a day (QID) | RESPIRATORY_TRACT | 3 refills | Status: DC | PRN

## 2020-06-27 MED ORDER — MONTELUKAST SODIUM 10 MG PO TABS: 10.0000 mg | ORAL_TABLET | Freq: Every day | ORAL | 2 refills | Status: DC

## 2020-06-27 NOTE — Telephone Encounter (Signed)
Called and spoke with patient daughter [name], Per DPR and went over Dr Juanetta Gosling recommendations. Confirmed that they are ok trying the montelukast 10mg  nightly with albuterol hfa q6 prn and ipratropium-albuterol via neb q6 hours PRN. [name] expressed full understanding of how to take medications and when and aware to call if needed.  [name] confirmed patient does have a nebulizer machine at this time.

## 2020-06-27 NOTE — Telephone Encounter (Signed)
Please let him know I have reviewed his insurance plan medication formulary.  Unfortunately almost all the listed options are tier 3 like trelegy.  As a result he would have to pay a $95 deductible and then $47 per month.  Alternative is to have him try montelukast 10 mg nightly with albuterol hfa q6h prn and ipratropium-albuterol via nebulizer q6h prn.  Tried calling listed number, but no answer.  Please check if he is okay with starting montelukast and switching albuterol nebulizer medicine to ipratropium-albuterol.

## 2020-06-27 NOTE — Progress Notes (Signed)
[facility]  Chief Complaint  Patient presents with   Follow-up    "Feeling about the same"    Constitutional:  BP 94/64 (BP Location: Left Arm, Cuff Size: Normal)   Pulse 62   Temp (!) 97.4 F (36.3 C) (Temporal)   Ht 5\' 2"  (1.575 m)   Wt 154 lb 3.2 oz (69.9 kg)   SpO2 97%   BMI 28.20 kg/m   Past Medical History:  OA, CAD s/p stent and CABG, CHF, Diverticulosis, Goiter, Colon polyps, Nephrolithiasis, HTN, PAD, Prostate cancer, Ptosis Lt eye, A fib  Past Surgical History:  He  has a past surgical history that includes Colonoscopy w/ polypectomy (12-26-2009); Prostate biopsy; Coronary artery bypass graft (1997   dr Merleen Nicely); Coronary artery bypass graft (07-24-2004  dr gerhardt); Total hip arthroplasty (Left, 1983); transthoracic echocardiogram (03-01-2013); Cardiovascular stress test (09-09-2013  dr Roderic Pal[place] [facility]); Coronary angioplasty with stent (07-17-2004   dr Sabino Snipes); Cardiac catheterization (08-24-2004  dr Lia Foyer); Tonsillectomy (age 31); Appendectomy (1973); Eye surgery (Left, 1996); Cataract extraction w/ intraocular lens  implant, bilateral (2006); ORIF RIGHT TIB-FIB FX (1990's); and Radioactive seed implant (N/A, 05/02/2014).  Brief Summary:  [facility] is a 75 y.o. male former smoker with chronic cough.       Subjective:   He is here with his daughter.  His chest xray from 06/08/20 showed scarring and compression fracture.  He isn't having back pain.  PFT from earlier this month showed mild diffusion defect suggestive of emphysema.  He used trelegy.  He wasn't coughing as much while using trelegy.  Unfortunately refilling this was cost prohibitive.  Since he ran out of trelegy he started using albuterol and cough drops more regularly.  He feels like he is keeping up with his activity okay.  Not having any trouble with his sleep.  Physical Exam:   Appearance - well kempt   ENMT - no sinus  tenderness, no oral exudate, no LAN, Mallampati 3 airway, no stridor, poor hearing  Respiratory - equal breath sounds bilaterally, no wheezing or rales  CV - s1s2 regular rate and rhythm, no murmurs  Ext - no clubbing, no edema  Skin - no rashes  Psych - normal mood and affect    Pulmonary testing:  PFT 06/17/20 >> FEV1 1.77 (85%), FEV1% 75, TLC 4.40 (81%), DLCO 61%  Cardiac Tests:  Echo 03/24/18 >> EF 40 to 45%, mild LVH  Social History:  He  reports that he quit smoking about 2 years ago. His smoking use included cigarettes. He started smoking about 61 years ago. He has a 55.00 pack-year smoking history. He has never used smokeless tobacco. He reports that he does not drink alcohol and does not use drugs.  Family History:  His family history includes Cancer in his brother; Clotting disorder in his mother; Heart attack in his mother; Lung cancer in his father.     Assessment/Plan:   Chronic production cough and dyspnea with extensive history of tobacco smoking. - PFT suggestive of emphysema - could have a component of asthma - had favorable response to trelegy, but was too expensive - continue prn albuterol for now - asked him to forward a copy of his insurance plan formulary and then determine a suitable substitute for trelegy  CAD, ischemic cardiomyopathy with chronic systolic CHF, Paroxysmal atrial fibrillation, Nonsustained ventricular tachycardia. - followed by Dr. Carlyle Dolly with [facility]  Time Spent Involved in Patient Care on  Day of Examination:  22 minutes  Follow up:   Patient Instructions  Check your insurance medication formulary to see what options they list for inhaler therapy and let us know  Follow up in 6 months  Medication List:   Allergies as of 06/27/2020       Reactions   Codeine Nausea Only   Pill form of codeine causes nausea   Statins Other (See Comments)   Myalgia         Medication List        Accurate as of June 27, 2020 11:32 AM. If you have any questions, ask your nurse or doctor.          STOP taking these medications    Trelegy Ellipta 100-62.5-25 MCG/INH Aepb Generic drug: Fluticasone-Umeclidin-Vilant Stopped by: Chesley Mires, MD       TAKE these medications    albuterol (2.5 MG/3ML) 0.083% nebulizer solution Commonly known as: PROVENTIL Take 3 mLs (2.5 mg total) by nebulization every 4 (four) hours as needed for shortness of breath.   carvedilol 12.5 MG tablet Commonly known as: COREG TAKE 1.5 TABLETS BY MOUTH TWICE DAILY.   dextromethorphan-guaiFENesin 30-600 MG 12hr tablet Commonly known as: MUCINEX DM Take 1 tablet by mouth 2 (two) times daily. What changed:  when to take this reasons to take this   DIUREX PO Take 50 mg by mouth 3 (three) times daily.   Fish Oil 300 MG Caps Take 2 capsules by mouth 2 (two) times daily.   fluticasone 50 MCG/ACT nasal spray Commonly known as: FLONASE Place 1 spray into both nostrils 2 (two) times daily.   hydrochlorothiazide 25 MG tablet Commonly known as: HYDRODIURIL Take 1 tablet by mouth daily.   lisinopril 2.5 MG tablet Commonly known as: ZESTRIL TAKE ONE TABLET BY MOUTH DAILY.   Nebulizer Misc Please provide 1 nebulizer machine and all supplies. Diagnosis: J44.1, Office number: [phone]   nitroGLYCERIN 0.4 MG SL tablet Commonly known as: NITROSTAT PLACE 1 TABLET UNDER TONGUE EVERY 5 MINUTES AS NEEDED FOR CHEST PAIN.   Vitamin D3 1.25 MG (50000 UT) Caps Take 1 capsule by mouth daily.   warfarin 2.5 MG tablet Commonly known as: COUMADIN Take as directed by the anticoagulation clinic. If you are unsure how to take this medication, talk to your nurse or doctor. Original instructions: Continue 1 tablet daily except 2 tablets on Thursdays or as directed        Signature:  Chesley Mires, MD [place] Pager - [phone] 06/27/2020, 11:32 AM

## 2020-06-27 NOTE — Progress Notes (Signed)
Patient in for office visit today 06/27/20 with Dr Halford Chessman. Per verbal from Dr Halford Chessman, Dr Halford Chessman went over xray results with patient.

## 2020-06-27 NOTE — Patient Instructions (Signed)
Check your insurance medication formulary to see what options they list for inhaler therapy and let us know  Follow up in 6 months

## 2020-07-08 ENCOUNTER — Ambulatory Visit (INDEPENDENT_AMBULATORY_CARE_PROVIDER_SITE_OTHER): Payer: Medicare Other | Admitting: *Deleted

## 2020-07-08 DIAGNOSIS — I4891 Unspecified atrial fibrillation: Secondary | ICD-10-CM | POA: Diagnosis not present

## 2020-07-08 DIAGNOSIS — Z5181 Encounter for therapeutic drug level monitoring: Secondary | ICD-10-CM

## 2020-07-08 LAB — POCT INR: INR: 1.6 — AB (ref 2.0–3.0)

## 2020-07-08 MED ORDER — WARFARIN SODIUM 2.5 MG PO TABS: ORAL_TABLET | ORAL | 6 refills | Status: DC

## 2020-07-08 NOTE — Patient Instructions (Signed)
[facility] bubble packing:  Using Warfarin 2.5mg  tablet now Take warfarin extra 1 tablet today then increase dose to 1 tablet daily except 1 1/2 tablets on Mondays, Wednesdays and Fridays.  Recheck in 3 wks. Call [phone] for any questions or medication changes New RX sent to [facility]

## 2020-07-15 ENCOUNTER — Other Ambulatory Visit: Payer: Self-pay

## 2020-07-15 ENCOUNTER — Inpatient Hospital Stay (HOSPITAL_COMMUNITY)
Admission: EM | Admit: 2020-07-15 | Discharge: 2020-07-18 | DRG: 682 | Disposition: A | Discharge status: Home / Self Care | Payer: Medicare Other | Attending: Student | Admitting: Student

## 2020-07-15 ENCOUNTER — Encounter (HOSPITAL_COMMUNITY): Payer: Self-pay

## 2020-07-15 ENCOUNTER — Emergency Department (HOSPITAL_COMMUNITY): Payer: Medicare Other

## 2020-07-15 DIAGNOSIS — G8194 Hemiplegia, unspecified affecting left nondominant side: Secondary | ICD-10-CM | POA: Diagnosis present

## 2020-07-15 DIAGNOSIS — R414 Neurologic neglect syndrome: Secondary | ICD-10-CM | POA: Diagnosis not present

## 2020-07-15 DIAGNOSIS — I2510 Atherosclerotic heart disease of native coronary artery without angina pectoris: Secondary | ICD-10-CM | POA: Diagnosis present

## 2020-07-15 DIAGNOSIS — I739 Peripheral vascular disease, unspecified: Secondary | ICD-10-CM | POA: Diagnosis not present

## 2020-07-15 DIAGNOSIS — D6959 Other secondary thrombocytopenia: Secondary | ICD-10-CM | POA: Diagnosis present

## 2020-07-15 DIAGNOSIS — J439 Emphysema, unspecified: Secondary | ICD-10-CM

## 2020-07-15 DIAGNOSIS — I5022 Chronic systolic (congestive) heart failure: Secondary | ICD-10-CM | POA: Diagnosis not present

## 2020-07-15 DIAGNOSIS — I159 Secondary hypertension, unspecified: Secondary | ICD-10-CM

## 2020-07-15 DIAGNOSIS — I480 Paroxysmal atrial fibrillation: Secondary | ICD-10-CM | POA: Diagnosis not present

## 2020-07-15 DIAGNOSIS — R410 Disorientation, unspecified: Secondary | ICD-10-CM | POA: Diagnosis not present

## 2020-07-15 DIAGNOSIS — I517 Cardiomegaly: Secondary | ICD-10-CM | POA: Diagnosis not present

## 2020-07-15 DIAGNOSIS — N179 Acute kidney failure, unspecified: Secondary | ICD-10-CM | POA: Diagnosis not present

## 2020-07-15 DIAGNOSIS — I252 Old myocardial infarction: Secondary | ICD-10-CM

## 2020-07-15 DIAGNOSIS — Z885 Allergy status to narcotic agent status: Secondary | ICD-10-CM | POA: Diagnosis not present

## 2020-07-15 DIAGNOSIS — E860 Dehydration: Secondary | ICD-10-CM | POA: Diagnosis not present

## 2020-07-15 DIAGNOSIS — U071 COVID-19: Secondary | ICD-10-CM | POA: Diagnosis not present

## 2020-07-15 DIAGNOSIS — N1831 Chronic kidney disease, stage 3a: Secondary | ICD-10-CM | POA: Diagnosis present

## 2020-07-15 DIAGNOSIS — I4891 Unspecified atrial fibrillation: Secondary | ICD-10-CM | POA: Diagnosis not present

## 2020-07-15 DIAGNOSIS — E876 Hypokalemia: Secondary | ICD-10-CM | POA: Diagnosis not present

## 2020-07-15 DIAGNOSIS — Z951 Presence of aortocoronary bypass graft: Secondary | ICD-10-CM | POA: Diagnosis not present

## 2020-07-15 DIAGNOSIS — I6389 Other cerebral infarction: Secondary | ICD-10-CM | POA: Diagnosis not present

## 2020-07-15 DIAGNOSIS — J432 Centrilobular emphysema: Secondary | ICD-10-CM | POA: Diagnosis not present

## 2020-07-15 DIAGNOSIS — J9811 Atelectasis: Secondary | ICD-10-CM | POA: Diagnosis not present

## 2020-07-15 DIAGNOSIS — G459 Transient cerebral ischemic attack, unspecified: Secondary | ICD-10-CM | POA: Diagnosis present

## 2020-07-15 DIAGNOSIS — Z955 Presence of coronary angioplasty implant and graft: Secondary | ICD-10-CM

## 2020-07-15 DIAGNOSIS — D696 Thrombocytopenia, unspecified: Secondary | ICD-10-CM | POA: Diagnosis not present

## 2020-07-15 DIAGNOSIS — R791 Abnormal coagulation profile: Secondary | ICD-10-CM | POA: Diagnosis not present

## 2020-07-15 DIAGNOSIS — Z96642 Presence of left artificial hip joint: Secondary | ICD-10-CM | POA: Diagnosis present

## 2020-07-15 DIAGNOSIS — R531 Weakness: Secondary | ICD-10-CM | POA: Diagnosis not present

## 2020-07-15 DIAGNOSIS — D751 Secondary polycythemia: Secondary | ICD-10-CM | POA: Diagnosis present

## 2020-07-15 DIAGNOSIS — I4892 Unspecified atrial flutter: Secondary | ICD-10-CM | POA: Diagnosis present

## 2020-07-15 DIAGNOSIS — Z87891 Personal history of nicotine dependence: Secondary | ICD-10-CM

## 2020-07-15 DIAGNOSIS — Z79899 Other long term (current) drug therapy: Secondary | ICD-10-CM

## 2020-07-15 DIAGNOSIS — Z8249 Family history of ischemic heart disease and other diseases of the circulatory system: Secondary | ICD-10-CM

## 2020-07-15 DIAGNOSIS — I110 Hypertensive heart disease with heart failure: Secondary | ICD-10-CM | POA: Diagnosis not present

## 2020-07-15 DIAGNOSIS — I255 Ischemic cardiomyopathy: Secondary | ICD-10-CM | POA: Diagnosis present

## 2020-07-15 DIAGNOSIS — Z8546 Personal history of malignant neoplasm of prostate: Secondary | ICD-10-CM

## 2020-07-15 DIAGNOSIS — I10 Essential (primary) hypertension: Secondary | ICD-10-CM | POA: Diagnosis present

## 2020-07-15 DIAGNOSIS — H9190 Unspecified hearing loss, unspecified ear: Secondary | ICD-10-CM | POA: Diagnosis present

## 2020-07-15 DIAGNOSIS — R29709 NIHSS score 9: Secondary | ICD-10-CM | POA: Diagnosis not present

## 2020-07-15 DIAGNOSIS — R7989 Other specified abnormal findings of blood chemistry: Secondary | ICD-10-CM | POA: Diagnosis not present

## 2020-07-15 DIAGNOSIS — D631 Anemia in chronic kidney disease: Secondary | ICD-10-CM | POA: Diagnosis not present

## 2020-07-15 DIAGNOSIS — I63233 Cerebral infarction due to unspecified occlusion or stenosis of bilateral carotid arteries: Secondary | ICD-10-CM | POA: Diagnosis not present

## 2020-07-15 DIAGNOSIS — R519 Headache, unspecified: Secondary | ICD-10-CM | POA: Diagnosis not present

## 2020-07-15 DIAGNOSIS — R29810 Facial weakness: Secondary | ICD-10-CM | POA: Diagnosis not present

## 2020-07-15 DIAGNOSIS — Z888 Allergy status to other drugs, medicaments and biological substances status: Secondary | ICD-10-CM

## 2020-07-15 DIAGNOSIS — I63212 Cerebral infarction due to unspecified occlusion or stenosis of left vertebral arteries: Secondary | ICD-10-CM | POA: Diagnosis not present

## 2020-07-15 DIAGNOSIS — R42 Dizziness and giddiness: Secondary | ICD-10-CM | POA: Diagnosis not present

## 2020-07-15 DIAGNOSIS — I639 Cerebral infarction, unspecified: Secondary | ICD-10-CM | POA: Diagnosis not present

## 2020-07-15 DIAGNOSIS — E049 Nontoxic goiter, unspecified: Secondary | ICD-10-CM | POA: Diagnosis present

## 2020-07-15 DIAGNOSIS — G319 Degenerative disease of nervous system, unspecified: Secondary | ICD-10-CM | POA: Diagnosis not present

## 2020-07-15 DIAGNOSIS — Z7901 Long term (current) use of anticoagulants: Secondary | ICD-10-CM

## 2020-07-15 DIAGNOSIS — R262 Difficulty in walking, not elsewhere classified: Secondary | ICD-10-CM | POA: Diagnosis not present

## 2020-07-15 DIAGNOSIS — R29818 Other symptoms and signs involving the nervous system: Secondary | ICD-10-CM | POA: Diagnosis not present

## 2020-07-15 LAB — I-STAT CHEM 8, ED
BUN: 18 mg/dL (ref 8–23)
Calcium, Ion: 1.11 mmol/L — ABNORMAL LOW (ref 1.15–1.40)
Chloride: 97 mmol/L — ABNORMAL LOW (ref 98–111)
Creatinine, Ser: 1.7 mg/dL — ABNORMAL HIGH (ref 0.61–1.24)
Glucose, Bld: 107 mg/dL — ABNORMAL HIGH (ref 70–99)
HCT: 57 % — ABNORMAL HIGH (ref 39.0–52.0)
Hemoglobin: 19.4 g/dL — ABNORMAL HIGH (ref 13.0–17.0)
Potassium: 3.4 mmol/L — ABNORMAL LOW (ref 3.5–5.1)
Sodium: 137 mmol/L (ref 135–145)
TCO2: 26 mmol/L (ref 22–32)

## 2020-07-15 LAB — COMPREHENSIVE METABOLIC PANEL
ALT: 14 U/L (ref 0–44)
AST: 14 U/L — ABNORMAL LOW (ref 15–41)
Albumin: 4 g/dL (ref 3.5–5.0)
Alkaline Phosphatase: 56 U/L (ref 38–126)
Anion gap: 9 (ref 5–15)
BUN: 18 mg/dL (ref 8–23)
CO2: 26 mmol/L (ref 22–32)
Calcium: 9 mg/dL (ref 8.9–10.3)
Chloride: 98 mmol/L (ref 98–111)
Creatinine, Ser: 1.67 mg/dL — ABNORMAL HIGH (ref 0.61–1.24)
GFR, Estimated: 42 mL/min — ABNORMAL LOW (ref >60)
Glucose, Bld: 107 mg/dL — ABNORMAL HIGH (ref 70–99)
Potassium: 3.3 mmol/L — ABNORMAL LOW (ref 3.5–5.1)
Sodium: 133 mmol/L — ABNORMAL LOW (ref 135–145)
Total Bilirubin: 3 mg/dL — ABNORMAL HIGH (ref 0.3–1.2)
Total Protein: 6.7 g/dL (ref 6.5–8.1)

## 2020-07-15 LAB — URINALYSIS, ROUTINE W REFLEX MICROSCOPIC
Bacteria, UA: NONE SEEN
Bilirubin Urine: NEGATIVE
Glucose, UA: NEGATIVE mg/dL
Ketones, ur: NEGATIVE mg/dL
Leukocytes,Ua: NEGATIVE
Nitrite: NEGATIVE
Protein, ur: NEGATIVE mg/dL
Specific Gravity, Urine: 1.045 — ABNORMAL HIGH (ref 1.005–1.030)
pH: 6 (ref 5.0–8.0)

## 2020-07-15 LAB — DIFFERENTIAL
Abs Immature Granulocytes: 0.1 10*3/uL — ABNORMAL HIGH (ref 0.00–0.07)
Basophils Absolute: 0.1 10*3/uL (ref 0.0–0.1)
Basophils Relative: 1 %
Eosinophils Absolute: 0 10*3/uL (ref 0.0–0.5)
Eosinophils Relative: 0 %
Immature Granulocytes: 1 %
Lymphocytes Relative: 10 %
Lymphs Abs: 0.9 10*3/uL (ref 0.7–4.0)
Monocytes Absolute: 1.1 10*3/uL — ABNORMAL HIGH (ref 0.1–1.0)
Monocytes Relative: 12 %
Neutro Abs: 6.9 10*3/uL (ref 1.7–7.7)
Neutrophils Relative %: 76 %

## 2020-07-15 LAB — CBC
HCT: 54.2 % — ABNORMAL HIGH (ref 39.0–52.0)
Hemoglobin: 18.3 g/dL — ABNORMAL HIGH (ref 13.0–17.0)
MCH: 30.3 pg (ref 26.0–34.0)
MCHC: 33.8 g/dL (ref 30.0–36.0)
MCV: 89.7 fL (ref 80.0–100.0)
Platelets: 142 10*3/uL — ABNORMAL LOW (ref 150–400)
RBC: 6.04 MIL/uL — ABNORMAL HIGH (ref 4.22–5.81)
RDW: 16.3 % — ABNORMAL HIGH (ref 11.5–15.5)
WBC: 9.1 10*3/uL (ref 4.0–10.5)
nRBC: 0 % (ref 0.0–0.2)

## 2020-07-15 LAB — RESP PANEL BY RT-PCR (FLU A&B, COVID) ARPGX2
Influenza A by PCR: NEGATIVE
Influenza B by PCR: NEGATIVE
SARS Coronavirus 2 by RT PCR: POSITIVE — AB

## 2020-07-15 LAB — LACTATE DEHYDROGENASE: LDH: 120 U/L (ref 98–192)

## 2020-07-15 LAB — APTT: aPTT: 42 seconds — ABNORMAL HIGH (ref 24–36)

## 2020-07-15 LAB — RAPID URINE DRUG SCREEN, HOSP PERFORMED
Amphetamines: NOT DETECTED
Barbiturates: NOT DETECTED
Benzodiazepines: NOT DETECTED
Cocaine: NOT DETECTED
Opiates: NOT DETECTED
Tetrahydrocannabinol: NOT DETECTED

## 2020-07-15 LAB — ETHANOL: Alcohol, Ethyl (B): <10 mg/dL (ref <10)

## 2020-07-15 LAB — PROTIME-INR
INR: 1.7 — ABNORMAL HIGH (ref 0.8–1.2)
Prothrombin Time: 20.3 seconds — ABNORMAL HIGH (ref 11.4–15.2)

## 2020-07-15 LAB — D-DIMER, QUANTITATIVE: D-Dimer, Quant: 0.95 ug/mL-FEU — ABNORMAL HIGH (ref 0.00–0.50)

## 2020-07-15 MED ORDER — METOPROLOL TARTRATE 5 MG/5ML IV SOLN: 2.5000 mg | INTRAVENOUS | Status: DC | PRN

## 2020-07-15 MED ORDER — CAFFEINE-MAGNESIUM SALICYLATE 50-162.5 MG PO TABS: ORAL_TABLET | Freq: Three times a day (TID) | ORAL | Status: DC

## 2020-07-15 MED ORDER — VITAMIN D 25 MCG (1000 UNIT) PO TABS
1000.0000 [IU] | ORAL_TABLET | Freq: Every day | ORAL | Status: DC
  Administered 2020-07-16 – 2020-07-18 (×3): 1000 [IU] via ORAL
  Filled 2020-07-15 (×3): qty 1

## 2020-07-15 MED ORDER — WARFARIN - PHARMACIST DOSING INPATIENT: Freq: Every day | Status: DC

## 2020-07-15 MED ORDER — IPRATROPIUM-ALBUTEROL 0.5-2.5 (3) MG/3ML IN SOLN: 3.0000 mL | Freq: Four times a day (QID) | RESPIRATORY_TRACT | Status: DC | PRN

## 2020-07-15 MED ORDER — OMEGA-3-ACID ETHYL ESTERS 1 G PO CAPS
1000.0000 mg | ORAL_CAPSULE | Freq: Two times a day (BID) | ORAL | Status: DC
  Administered 2020-07-15 – 2020-07-18 (×6): 1000 mg via ORAL
  Filled 2020-07-15 (×6): qty 1

## 2020-07-15 MED ORDER — ACETAMINOPHEN 650 MG RE SUPP: 650.0000 mg | RECTAL | Status: DC | PRN

## 2020-07-15 MED ORDER — NITROGLYCERIN 0.4 MG SL SUBL: 0.4000 mg | SUBLINGUAL_TABLET | SUBLINGUAL | Status: DC | PRN

## 2020-07-15 MED ORDER — SODIUM CHLORIDE 0.9 % IV SOLN: INTRAVENOUS | Status: DC

## 2020-07-15 MED ORDER — MONTELUKAST SODIUM 10 MG PO TABS
10.0000 mg | ORAL_TABLET | Freq: Every day | ORAL | Status: DC
  Administered 2020-07-15 – 2020-07-17 (×3): 10 mg via ORAL
  Filled 2020-07-15 (×3): qty 1

## 2020-07-15 MED ORDER — LISINOPRIL 5 MG PO TABS: 2.5000 mg | ORAL_TABLET | Freq: Every day | ORAL | Status: DC

## 2020-07-15 MED ORDER — DM-GUAIFENESIN ER 30-600 MG PO TB12: 1.0000 | ORAL_TABLET | Freq: Two times a day (BID) | ORAL | Status: DC | PRN

## 2020-07-15 MED ORDER — SENNOSIDES-DOCUSATE SODIUM 8.6-50 MG PO TABS
1.0000 | ORAL_TABLET | Freq: Every day | ORAL | Status: DC
  Administered 2020-07-15 – 2020-07-17 (×2): 1 via ORAL
  Filled 2020-07-15 (×3): qty 1

## 2020-07-15 MED ORDER — ACETAMINOPHEN 160 MG/5ML PO SOLN: 650.0000 mg | ORAL | Status: DC | PRN

## 2020-07-15 MED ORDER — SODIUM CHLORIDE 0.9 % IV SOLN
100.0000 mg | INTRAVENOUS | Status: AC
  Administered 2020-07-15 (×2): 100 mg via INTRAVENOUS
  Filled 2020-07-15 (×2): qty 20

## 2020-07-15 MED ORDER — ALBUTEROL SULFATE HFA 108 (90 BASE) MCG/ACT IN AERS
2.0000 | INHALATION_SPRAY | Freq: Four times a day (QID) | RESPIRATORY_TRACT | Status: DC | PRN
  Administered 2020-07-16: 2 via RESPIRATORY_TRACT
  Filled 2020-07-15: qty 6.7

## 2020-07-15 MED ORDER — STROKE: EARLY STAGES OF RECOVERY BOOK
Freq: Once | Status: DC
  Filled 2020-07-15: qty 1

## 2020-07-15 MED ORDER — FLUTICASONE PROPIONATE 50 MCG/ACT NA SUSP
1.0000 | Freq: Two times a day (BID) | NASAL | Status: DC
  Administered 2020-07-16 – 2020-07-17 (×2): 1 via NASAL
  Filled 2020-07-15: qty 16

## 2020-07-15 MED ORDER — ACETAMINOPHEN 325 MG PO TABS: 650.0000 mg | ORAL_TABLET | ORAL | Status: DC | PRN

## 2020-07-15 MED ORDER — WARFARIN SODIUM 2.5 MG PO TABS: 2.5000 mg | ORAL_TABLET | Freq: Every day | ORAL | Status: DC

## 2020-07-15 MED ORDER — CARVEDILOL 3.125 MG PO TABS: 3.1250 mg | ORAL_TABLET | Freq: Two times a day (BID) | ORAL | Status: DC

## 2020-07-15 MED ORDER — METOPROLOL TARTRATE 25 MG PO TABS
25.0000 mg | ORAL_TABLET | Freq: Two times a day (BID) | ORAL | Status: DC
  Administered 2020-07-15 – 2020-07-18 (×6): 25 mg via ORAL
  Filled 2020-07-15 (×6): qty 1

## 2020-07-15 MED ORDER — WARFARIN SODIUM 5 MG PO TABS: 5.0000 mg | ORAL_TABLET | Freq: Once | ORAL | Status: DC

## 2020-07-15 MED ORDER — IOHEXOL 350 MG/ML SOLN
100.0000 mL | Freq: Once | INTRAVENOUS | Status: AC | PRN
  Administered 2020-07-15: 100 mL via INTRAVENOUS

## 2020-07-15 MED ORDER — SODIUM CHLORIDE 0.9 % IV SOLN
100.0000 mg | Freq: Every day | INTRAVENOUS | Status: AC
  Administered 2020-07-16 – 2020-07-17 (×2): 100 mg via INTRAVENOUS
  Filled 2020-07-15: qty 100
  Filled 2020-07-15: qty 20

## 2020-07-15 MED ORDER — HYDROCHLOROTHIAZIDE 25 MG PO TABS: 25.0000 mg | ORAL_TABLET | Freq: Every day | ORAL | Status: DC

## 2020-07-15 NOTE — ED Provider Notes (Signed)
, [facility] EMERGENCY DEPARTMENT Provider Note   CSN: [id number] Arrival date & time: 07/15/20  1741  An emergency department physician performed an initial assessment on this suspected stroke patient at 1800.  History Chief Complaint  Patient presents with   Code Stroke    Vincent Carter is a 75 y.o. male.  HPI  This patient is a 75 year old male, he has a known history of cardiovascular disease with congestive heart failure, COPD, he has had prior bypass grafting.  He also has a history of atrial fibrillation and flutter for which she is on Coumadin.  He presents to the hospital today with a complaint of altered mental status.  He is accompanied by his daughter-in-law and other family members who state that he was last seen normal around 78, at that time the patient had been leaving the car running for some reason but was his normal self.  He went to get in the car to go into town, he drove up to place of business and sat in his car for 15 minutes, he was found to be confused, altered, had left-sided facial droop and some focal unilateral weakness although it is not clear what side was weak.  The patient denies any symptoms including headache chest pain or shortness of breath.  He still has some confusion, level 5 caveat applies secondary to altered mental status.  The daughter in law is at the bedside stating he is still confused and altered  Past Medical History:  Diagnosis Date   Arthritis    ASCVD (arteriosclerotic cardiovascular disease) cardiologist--  is now dr Roderic Palau branch (was dr [name])    CABG in 1997 -- LIMA to LAD;  cath 07-17-2012 PCI and BMStenting;   RE-DO CABG X4  in  07-24-2004 three-vessel disease; cath in 08-24-2004 post CABG ef 40-45% w/ anteroapical hypokinesis; occlusion SVG  to  RI; patent RIMA to LAD; unsuccessful PCI in totally occluded RI graft.;  last myoview 09-09-2013, no ischemia, moderate inferior wall defect, ef 49%   CHF (congestive heart  failure) ([place])    Chronic obstructive pulmonary disease ([place])    Diverticulosis of colon    Euthyroid goiter    with low TSH    History of colon polyps    hyperplastic 2011   History of diverticulitis    History of kidney stones    History of ST elevation myocardial infarction (STEMI)    07-17-2004   w/ extensive RV infact per cath report   HOH (hard of hearing)    no hearing aid   Hypertension    Low TSH level    Clinically euthyroid   Peripheral vascular disease (HCC)    Decreased distal pulses and abnormal Doppler   Prostate cancer [facility]) urologist--  dr Irine Seal   T1c, Gleason 3+4=7,  PSA 4.71,  vol 38cc   Ptosis    Left, secondary to injury   S/p bare metal coronary artery stent    x3  to RCA  07-17-2004    Patient Active Problem List   Diagnosis Date Noted   Encounter for therapeutic drug monitoring 05/22/2018   New onset atrial fibrillation ([place]) 03/25/2018   Atrial fibrillation with RVR ([place]) 03/25/2018   Acute on chronic systolic CHF (congestive heart failure) ([place]) 03/24/2018   COPD with acute exacerbation ([place]) 03/24/2018   Malignant neoplasm of prostate ([place]) 03/05/2014   Acute chest pain 09/08/2013   Vertigo 09/08/2013   Bigeminal rhythm 09/08/2013   ASCVD (arteriosclerotic cardiovascular  disease)    Tobacco abuse, in remission    Low TSH level    Peripheral vascular disease ([place])    Colonic polyp 12/29/2009   Hypertension 12/09/2008   Chronic obstructive pulmonary disease ([place]) 12/09/2008    Past Surgical History:  Procedure Laterality Date   [place]  08-24-2004  dr Lia Foyer   post re-do CABG/  Unsuccessful attempt PCI of clotted SVG to RI/  anteroapical segment severely hypokinetic,  ef 40-45%/  fresh occlusion SVG to RI,  continued patency of SVG to PAD and RIMA to LAD /   dLM 80%   CARDIOVASCULAR STRESS TEST  09-09-2013  dr Roderic Palau branch   no reversible ischemia,  moderately large defect noted in the inferior  wall extending from  mid ventricle to apex also involves true ventricle apex and anteroapical wall/  hypokinesis of inferior wall in region of prior infarct/scarring/  LVEF 49%   CATARACT EXTRACTION W/ INTRAOCULAR LENS  IMPLANT, BILATERAL  2006   COLONOSCOPY W/ POLYPECTOMY  12-26-2009   CORONARY ANGIOPLASTY WITH STENT PLACEMENT  07-17-2004   dr Sabino Snipes   PCI w/ BMS x3 to RCA and balloon pump placement/  LVSF after revascularization , ef 65% w/ mild inferior hypokinesis/  dLM  70% extending into origins of LAD (70% ostial, 80% mid, D1 branch 90% ostial)  CFX (80% ostial OM, 70% after first branch) /   99% mRCA stented and no residual;  remains 80% origin of posterior LV branch/  most apical portion of PDA was embolized    CORONARY ARTERY BYPASS GRAFT  1997   dr Merleen Nicely   LIMA to LAD   CORONARY ARTERY BYPASS GRAFT  07-24-2004  dr gerhardt   RIMA to LAD,  SVG to OM,  SVG to PDA,  SVG to RI   EYE SURGERY Left 1996   repair ocular injury   ORIF RIGHT TIB-FIB FX  1990's   PROSTATE BIOPSY     RADIOACTIVE SEED IMPLANT N/A 05/02/2014   Procedure: RADIOACTIVE SEED IMPLANT;  Surgeon: Irine Seal, MD;  Location: [facility];  Service: Urology;  Laterality: N/A;   TONSILLECTOMY  age 12   TOTAL HIP ARTHROPLASTY Left 1983   RECONSTRUCTION/ THA/  SKIN AND BONE GRAFTS--  pt fell from 5 stories off [place]   TRANSTHORACIC ECHOCARDIOGRAM  [id number]   diastolic dysfunction/  ef 45-50%/  mild AV calcification without stenosis,  mild AR/  mild LAE and RAE/  trivial MR and TR         Family History  Problem Relation Age of Onset   Clotting disorder Mother    Heart attack Mother    Lung cancer Father    Cancer Brother        prostate    Social History   Tobacco Use   Smoking status: Former    Packs/day: 1.00    Years: 55.00    Pack years: 55.00    Types: Cigarettes    Start date: 08/13/1958    Quit date: 04/12/2018    Years since quitting: 2.2   Smokeless tobacco: Never   Vaping Use   Vaping Use: Former  Substance Use Topics   Alcohol use: No    Alcohol/week: 0.0 standard drinks   Drug use: No    Home Medications Prior to Admission medications   Medication Sig Start Date End Date Taking? Authorizing Provider  albuterol (VENTOLIN HFA) 108 (90 Base) MCG/ACT inhaler Inhale 2 puffs into  the lungs every 6 (six) hours as needed for wheezing or shortness of breath.   Yes [provider]  Caffeine-Magnesium Salicylate (DIUREX PO) Take 50 mg by mouth 3 (three) times daily.   Yes [provider]  carvedilol (COREG) 12.5 MG tablet TAKE 1.5 TABLETS BY MOUTH TWICE DAILY. 05/27/20  Yes BranchAlphonse Guild, MD  Cholecalciferol (VITAMIN D3) 1.25 MG (50000 UT) CAPS Take 1 capsule by mouth daily.   Yes [provider]  dextromethorphan-guaiFENesin (MUCINEX DM) 30-600 MG 12hr tablet Take 1 tablet by mouth 2 (two) times daily. Patient taking differently: Take 1 tablet by mouth 2 (two) times daily as needed. 03/30/18  Yes Kathie Dike, MD  fluticasone (FLONASE) 50 MCG/ACT nasal spray Place 1 spray into both nostrils 2 (two) times daily. 03/15/18  Yes [provider]  hydrochlorothiazide (HYDRODIURIL) 25 MG tablet Take 1 tablet by mouth daily. 05/27/20  Yes [provider]  ipratropium-albuterol (DUONEB) 0.5-2.5 (3) MG/3ML SOLN Take 3 mLs by nebulization every 6 (six) hours as needed. 06/27/20  Yes Chesley Mires, MD  lisinopril (ZESTRIL) 2.5 MG tablet TAKE ONE TABLET BY MOUTH DAILY. 04/24/20  Yes Branch, Alphonse Guild, MD  montelukast (SINGULAIR) 10 MG tablet Take 1 tablet (10 mg total) by mouth at bedtime. 06/27/20  Yes Chesley Mires, MD  nitroGLYCERIN (NITROSTAT) 0.4 MG SL tablet PLACE 1 TABLET UNDER TONGUE EVERY 5 MINUTES AS NEEDED FOR CHEST PAIN. 03/07/20  Yes Branch, Alphonse Guild, MD  Omega-3 Fatty Acids (FISH OIL) 300 MG CAPS Take 2 capsules by mouth 2 (two) times daily.   Yes [provider]  warfarin (COUMADIN) 2.5 MG tablet Take  warfarin extra 1 tablet (2.5mg ) today then increase dose to 1 tablet (2.5mg ) daily except 1 1/2 tablets (3.75mg ) on Mondays, Wednesdays and Fridays or as directed 07/08/20  Yes Branch, Alphonse Guild, MD  Nebulizer MISC Please provide 1 nebulizer machine and all supplies. Diagnosis: J44.1, Office number: [phone] 03/30/18   Kathie Dike, MD    Allergies    Codeine and Statins  Review of Systems   Review of Systems  Unable to perform ROS: Mental status change   Physical Exam Updated Vital Signs BP (!) 136/93   Pulse (!) 103   Temp 98 F (36.7 C)   Resp 18   Ht 1.575 m (5\' 2" )   Wt 69.9 kg   SpO2 94%   BMI 28.19 kg/m   Physical Exam Vitals and nursing note reviewed.  Constitutional:      General: He is not in acute distress.    Appearance: He is well-developed.  HENT:     Head: Normocephalic and atraumatic.     Mouth/Throat:     Pharynx: No oropharyngeal exudate.  Eyes:     General: No scleral icterus.       Right eye: No discharge.        Left eye: No discharge.     Conjunctiva/sclera: Conjunctivae normal.     Pupils: Pupils are equal, round, and reactive to light.  Neck:     Thyroid: No thyromegaly.     Vascular: No JVD.  Cardiovascular:     Rate and Rhythm: Regular rhythm. Tachycardia present.     Heart sounds: Normal heart sounds. No murmur heard.   No friction rub. No gallop.  Pulmonary:     Effort: Pulmonary effort is normal. No respiratory distress.     Breath sounds: Normal breath sounds. No wheezing or rales.  Abdominal:     General: Bowel  sounds are normal. There is no distension.     Palpations: Abdomen is soft. There is no mass.     Tenderness: There is no abdominal tenderness.  Musculoskeletal:        General: No tenderness. Normal range of motion.     Cervical back: Normal range of motion and neck supple.  Lymphadenopathy:     Cervical: No cervical adenopathy.  Skin:    General: Skin is warm and dry.     Findings: No erythema or rash.   Neurological:     Mental Status: He is alert.     Coordination: Coordination normal.     Comments: The patient is able to move all 4 extremities with normal strength, he can perform finger-nose-finger with bilateral hands, he can do heel shin with bilateral legs.  He is slow to answer questions and has a subtle left-sided facial droop.  Cranial nerves III through XII are otherwise normal.  Psychiatric:        Behavior: Behavior normal.    ED Results / Procedures / Treatments   Labs (all labs ordered are listed, but only abnormal results are displayed) Labs Reviewed  PROTIME-INR - Abnormal; Notable for the following components:      Result Value   Prothrombin Time 20.3 (*)    INR 1.7 (*)    All other components within normal limits  APTT - Abnormal; Notable for the following components:   aPTT 42 (*)    All other components within normal limits  CBC - Abnormal; Notable for the following components:   RBC 6.04 (*)    Hemoglobin 18.3 (*)    HCT 54.2 (*)    RDW 16.3 (*)    Platelets 142 (*)    All other components within normal limits  DIFFERENTIAL - Abnormal; Notable for the following components:   Monocytes Absolute 1.1 (*)    Abs Immature Granulocytes 0.10 (*)    All other components within normal limits  COMPREHENSIVE METABOLIC PANEL - Abnormal; Notable for the following components:   Sodium 133 (*)    Potassium 3.3 (*)    Glucose, Bld 107 (*)    Creatinine, Ser 1.67 (*)    AST 14 (*)    Total Bilirubin 3.0 (*)    GFR, Estimated 42 (*)    All other components within normal limits  I-STAT CHEM 8, ED - Abnormal; Notable for the following components:   Potassium 3.4 (*)    Chloride 97 (*)    Creatinine, Ser 1.70 (*)    Glucose, Bld 107 (*)    Calcium, Ion 1.11 (*)    Hemoglobin 19.4 (*)    HCT 57.0 (*)    All other components within normal limits  RESP PANEL BY RT-PCR (FLU A&B, COVID) ARPGX2  ETHANOL  RAPID URINE DRUG SCREEN, HOSP PERFORMED  URINALYSIS, ROUTINE W  REFLEX MICROSCOPIC    EKG None  Radiology CT HEAD CODE STROKE WO CONTRAST  Result Date: 07/15/2020 CLINICAL DATA:  Code stroke. Left-sided facial droop, confusion, weak arms, l difficulty walking, confusion. EXAM: CT HEAD WITHOUT CONTRAST TECHNIQUE: Contiguous axial images were obtained from the base of the skull through the vertex without intravenous contrast. COMPARISON:  Prior head CT 09/08/2013. FINDINGS: Brain: Mild to moderate generalized cerebral atrophy. Comparatively mild cerebellar atrophy. Patchy and ill-defined hypoattenuation within the cerebral white matter, nonspecific but most often secondary to chronic small vessel ischemia. Findings have progressed as compared to the head CT of 09/08/2013 white matter hypodensity is  somewhat more prominent within the right frontal lobe centrum semiovale and corona radiata and a superimposed acute white matter infarct at this site is difficult to exclude. Chronic appearing lacunar infarcts within the deep gray nuclei, progressed from the prior head CT of 09/08/2013. Chronic small chronic infarct within the callosal body also new from the prior exam (series 6, image 25). No acute cortical infarct or acute intracranial hemorrhage is identified. No extra-axial fluid collection. No evidence of an intracranial mass. No midline shift. Vascular: No hyperdense vessel.  Atherosclerotic calcifications Skull: Normal. Negative for fracture or focal lesion. Sinuses/Orbits: Visualized orbits show no acute finding. No significant paranasal sinus disease at the imaged levels. ASPECTS ([facility] Stroke Program Early CT Score) - Ganglionic level infarction (caudate, lentiform nuclei, internal capsule, insula, M1-M3 cortex): 7 - Supraganglionic infarction (M4-M6 cortex): 3 Total score (0-10 with 10 being normal): 10 These results were called by telephone at the time of interpretation on 07/15/2020 at 6:17 pm to provider [facility] , who verbally acknowledged these results.  IMPRESSION: No acute intracranial hemorrhage or acute demarcated cortical infarction. Patchy and ill-defined hypoattenuation within the cerebral white matter compatible with chronic small vessel ischemic disease. Findings have progressed as compared to the prior head CT of 09/08/2013. White matter hypodensity is somewhat more prominent within the right frontal lobe centrum semiovale and corona radiata, and a superimposed acute white matter infarct is difficult to definitively exclude. A brain MRI may be obtained for further evaluation, as clinically warranted. Chronic appearing lacunar infarcts within the deep gray nuclei, progressed in number from the head CT of 09/08/2013. A small chronic infarct within the callosal body is new from the prior exam. Progressive parenchymal atrophy. Electronically Signed   By: Kellie Simmering DO   On: 07/15/2020 18:17   CT ANGIO HEAD NECK W WO CM W PERF (CODE STROKE)  Result Date: 07/15/2020 CLINICAL DATA:  Neuro deficit, acute, stroke suspected. Left-sided facial droop, difficulty walking. Confusion. EXAM: CT ANGIOGRAPHY HEAD AND NECK CT PERFUSION BRAIN TECHNIQUE: Multidetector CT imaging of the head and neck was performed using the standard protocol during bolus administration of intravenous contrast. Multiplanar CT image reconstructions and MIPs were obtained to evaluate the vascular anatomy. Carotid stenosis measurements (when applicable) are obtained utilizing NASCET criteria, using the distal internal carotid diameter as the denominator. Multiphase CT imaging of the brain was performed following IV bolus contrast injection. Subsequent parametric perfusion maps were calculated using RAPID software. CONTRAST:  170mL OMNIPAQUE IOHEXOL 350 MG/ML SOLN COMPARISON:  Noncontrast head CT performed earlier today 07/15/2020. FINDINGS: CTA NECK FINDINGS Aortic arch: Standard aortic branching. Atherosclerotic plaque within the visualized aortic arch and proximal major branch vessels of the  neck. Right carotid system: CCA and ICA patent within neck without hemodynamically significant stenosis (50% or greater). Mild soft and calcified plaque within the carotid bifurcation and proximal ICA. Mild soft and calcified plaque is also present within the mid to distal cervical ICA. Left carotid system: CCA and ICA patent within the neck without hemodynamically significant stenosis (50% or greater) mild to moderate soft and calcified plaque within the distal CCA, carotid bifurcation and proximal ICA. Mild soft and calcified plaques also present within the mid cervical ICA. Vertebral arteries: Vertebral arteries patent within the neck. The right vertebral artery is dominant. No hemodynamically significant stenosis within the right vertebral artery within the neck. Moderate/severe atherosclerotic stenosis at the origin of the non dominant left vertebral artery. Skeleton: Cervical spondylosis. No acute bony abnormality or aggressive osseous lesion.  Other neck: No neck mass or cervical lymphadenopathy. Upper chest: Prior median sternotomy. No consolidation within the imaged lung apices. Review of the MIP images confirms the above findings CTA HEAD FINDINGS Anterior circulation: The intracranial internal carotid arteries are patent. Calcified plaque within both vessels with no more than mild stenosis. The M1 middle cerebral arteries are patent. Atherosclerotic irregularity of the M2 and more distal middle cerebral artery branches bilaterally. However, no M2 proximal branch occlusion or high-grade proximal M2 stenosis is identified. The anterior cerebral arteries are patent. No intracranial aneurysm is identified. Posterior circulation: The intracranial vertebral arteries are patent. Calcified plaque within the V4 left vertebral artery with no more than mild stenosis. Nonstenotic calcified plaque within the V4 right vertebral artery. The basilar artery is patent. The posterior cerebral arteries are patent. Posterior  communicating arteries are present bilaterally. Venous sinuses: Within the limitations of contrast timing, no convincing thrombus. Anatomic variants: None significant Review of the MIP images confirms the above findings CT Brain Perfusion Findings: CBF (<30%) Volume: 7mL Perfusion (Tmax>6.0s) volume: 79mL (right MCA vascular territory and right MCA/PCA watershed territory). Mismatch Volume: 13 mLmL Infarction Location:None identified These results were communicated to Dr. Curly [place] At 7:18 pmon 07/15/2020 by text page via the [facility] messaging system. IMPRESSION: CTA neck: 1. The common carotid and internal carotid arteries are patent within the neck without hemodynamically significant stenosis. Atherosclerotic disease within both carotid systems, as described. 2. Vertebral arteries patent within the neck. Moderate/severe atherosclerotic stenosis at the origin of the non-dominant left vertebral artery. CTA head: Intracranial atherosclerotic disease, as described. No intracranial large vessel occlusion or proximal high-grade arterial stenosis is identified. CT perfusion head: The perfusion software identifies patchy foci of hypoperfusion within the right cerebral hemisphere (MCA territory and MCA/PCA watershed territory) totaling 13 mL (utilizing the Tmax>6 seconds threshold). Reported mismatch 13 mL. Electronically Signed   By: Kellie Simmering DO   On: 07/15/2020 19:19    Procedures Procedures   Medications Ordered in ED Medications  iohexol (OMNIPAQUE) 350 MG/ML injection 100 mL (100 mLs Intravenous Contrast Given 07/15/20 1854)    ED Course  I have reviewed the triage vital signs and the nursing notes.  Pertinent labs & imaging results that were available during my care of the patient were reviewed by me and considered in my medical decision making (see chart for details).    MDM Rules/Calculators/A&P                          This patient has a slight shuffling gait, he has some abnormal mental status and  seems to be somewhat confused, left-sided facial droop all concerning for acute cerebrovascular injury.  The patient is on Coumadin, he will need a CT scan and labs performed immediately, code stroke was activated.  Family and patient are in agreement with the plan.  Acute ischemic stroke is likely, discussed with neuro recommended inpatient treatment at [facility] since we do not have neurology coverage here.  Radiology states there is no acute hemorrhage or obvious areas of ischemia other than progressive white matter disease.  Vital signs remained mildly tachycardic, no fever, blood pressure 136/93.  Patient and family members updated and agreeable to the plan.  Final Clinical Impression(s) / ED Diagnoses Final diagnoses:  Acute ischemic stroke [facility])    Rx / DC Orders ED Discharge Orders     None        Noemi Chapel, MD 07/15/20 Joen Laura

## 2020-07-15 NOTE — Progress Notes (Signed)
Code Stroke Time Documentation   [id number] Call Time Harmony Beeper Time 7124 Exam Started  1802 Exam Finished 5809 Images sent to [place] Exam completed in [place] radiology called

## 2020-07-15 NOTE — Consult Note (Signed)
[facility] Telemedicine Consult   Requesting Provider: Noemi Chapel Consult Participants: Myself, atrium nurse, bedside nurse, patient and family Location of the provider: [facility] Location of the patient: [facility]   This consult was provided via telemedicine with 2-way video and audio communication. The patient/family was informed that care would be provided in this way and agreed to receive care in this manner.    Chief Complaint: Confusion, right grip weakness   HPI: Vincent Carter is a 75 y.o. man with a past medical history significant for hypertension, coronary artery disease s/p CABG (1997, and again x4 in 2006, as well as multiple stents), COPD, congestive heart failure, ischemic cardiomyopathy (EF 40 to 45% 03/2018), euthyroid goiter with low TSH, peripheral vascular disease, prostate cancer, left eye ptosis secondary to injury, baseline hearing loss, atrial fibrillation on Coumadin, 55-pack-year smoking (quit in 2020), chronic kidney disease (baseline creatinine unclear at this time).  Telemetry evaluation is extremely limited secondary to the patient being hard of hearing and the cart being positioned to the patient's left when he is having intermittent left-sided neglect.  Family at bedside reports they last saw him well yesterday when nephew had interacted with him on the evening of the fourth and thought he was his completely normal self.  At 2 PM they noticed that his car was on with the lights running in the driveway or garage and they went in to check on him.  He seemed like his normal self and was able to converse appropriately but did not remember leaving his car running.   He did drive into town and was found confused in his car outside of a restaurant.  Per family's report restaurant staff noted reduced right arm grip.  On family's arrival they noted that the left side of his face was drooping, which they note has now resolved. Patient was also  complaining of a severe headache which he was complaining of this morning as well.  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  It is associated with nausea and vomiting occasionally but not clearly with headaches.  Does not have any clear trigger that the patient is able to identify such as position change etc.  At this time the patient notes his headache is markedly improved but still present  Of note regarding his anticoagulation in the past 2 checks in June have been subtherapeutic at 1.6 and today INR was found to be 1.7.  LKW: Unclear, some confusion at 2 PM, then found to have right grip weakness at 4 PM on 7/5; last definitely 100% normal at 5 PM on 7/4 tpa given?: No, out of the window and INR 1.7 IR Thrombectomy? No, negative for LVO, hypoperfusion of R MCA likely explains mild intermittent left sided symptoms Modified Rankin Scale: 0-Completely asymptomatic and back to baseline post- stroke Time of teleneurologist evaluation: 6:08 PM due to initial technical issues   Past Medical History:  Diagnosis Date   Arthritis    ASCVD (arteriosclerotic cardiovascular disease) cardiologist--  is now dr Roderic Palau branch (was dr [name])    CABG in 1997 -- LIMA to LAD;  cath 07-17-2012 PCI and BMStenting;   RE-DO CABG X4  in  07-24-2004 three-vessel disease; cath in 08-24-2004 post CABG ef 40-45% w/ anteroapical hypokinesis; occlusion SVG  to  RI; patent RIMA to LAD; unsuccessful PCI in totally occluded RI graft.;  last myoview 09-09-2013, no  ischemia, moderate inferior wall defect, ef 49%   CHF (congestive heart failure) (HCC)    Chronic obstructive pulmonary disease (HCC)    Diverticulosis of colon    Euthyroid goiter    with low TSH    History of colon polyps    hyperplastic 2011   History of diverticulitis    History of kidney stones    History of ST elevation myocardial infarction (STEMI)     07-17-2004   w/ extensive RV infact per cath report   HOH (hard of hearing)    no hearing aid   Hypertension    Low TSH level    Clinically euthyroid   Peripheral vascular disease (HCC)    Decreased distal pulses and abnormal Doppler   Prostate cancer [facility]) urologist--  dr Jenny Reichmann wrenn   T1c, Gleason 3+4=7,  PSA 4.71,  vol 38cc   Ptosis    Left, secondary to injury   S/p bare metal coronary artery stent    x3  to RCA  07-17-2004   Past Surgical History:  Procedure Laterality Date   [place]  08-24-2004  dr Lia Foyer   post re-do CABG/  Unsuccessful attempt PCI of clotted SVG to RI/  anteroapical segment severely hypokinetic,  ef 40-45%/  fresh occlusion SVG to RI,  continued patency of SVG to PAD and RIMA to LAD /   dLM 80%   CARDIOVASCULAR STRESS TEST  09-09-2013  dr Roderic Palau branch   no reversible ischemia,  moderately large defect noted in the inferior wall extending from  mid ventricle to apex also involves true ventricle apex and anteroapical wall/  hypokinesis of inferior wall in region of prior infarct/scarring/  LVEF 49%   CATARACT EXTRACTION W/ INTRAOCULAR LENS  IMPLANT, BILATERAL  2006   COLONOSCOPY W/ POLYPECTOMY  12-26-2009   CORONARY ANGIOPLASTY WITH STENT PLACEMENT  07-17-2004   dr Sabino Snipes   PCI w/ BMS x3 to RCA and balloon pump placement/  LVSF after revascularization , ef 65% w/ mild inferior hypokinesis/  dLM  70% extending into origins of LAD (70% ostial, 80% mid, D1 branch 90% ostial)  CFX (80% ostial OM, 70% after first branch) /   99% mRCA stented and no residual;  remains 80% origin of posterior LV branch/  most apical portion of PDA was embolized    CORONARY ARTERY BYPASS GRAFT  1997   dr Merleen Nicely   LIMA to LAD   CORONARY ARTERY BYPASS GRAFT  07-24-2004  dr gerhardt   RIMA to LAD,  SVG to OM,  SVG to PDA,  SVG to RI   EYE SURGERY Left 1996   repair ocular injury   ORIF RIGHT TIB-FIB FX  1990's   PROSTATE BIOPSY      RADIOACTIVE SEED IMPLANT N/A 05/02/2014   Procedure: RADIOACTIVE SEED IMPLANT;  Surgeon: Irine Seal, MD;  Location: [facility];  Service: Urology;  Laterality: N/A;   TONSILLECTOMY  age 30   TOTAL HIP ARTHROPLASTY Left 1983   RECONSTRUCTION/ THA/  SKIN AND BONE GRAFTS--  pt fell from 5 stories off [place]   TRANSTHORACIC ECHOCARDIOGRAM  [id number]   diastolic dysfunction/  ef 45-50%/  mild AV calcification without stenosis,  mild AR/  mild LAE and RAE/  trivial MR and TR       Exam: Vitals:   07/15/20 1855 07/15/20 1910  BP: 121/88 (!) 136/93  Pulse: (!) 114 (!) 103  Resp: 20 18  Temp:  SpO2: 98% 94%    General: Comfortable, no acute distress however fairly confused.  For example requests to urinate and unable to use the urinal but then soils himself Pulmonary: breathing comfortably Cardiac: regular rate and rhythm on monitor   NIH Stroke scale 1A: Level of Consciousness - 0 1B: Ask Month and Age - 0 1C: 'Blink Eyes' & 'Squeeze Hands' - 0 2: Test Horizontal Extraocular Movements - 0 3: Test Visual Fields - 0 -however intermittently does not appear to see things as well on the left side 4: Test Facial Palsy - 0 5A: Test Left Arm Motor Drift - 0 5B: Test Right Arm Motor Drift - 0 6A: Test Left Leg Motor Drift - 1 6B: Test Right Leg Motor Drift - 0 7: Test Limb Ataxia - 2 mild dysmetria in bilateral upper extremities 8: Test Sensation - 1 reduced reaction to pinch in the left upper extremity, does not reliably report sensation otherwise 9: Test Language/Aphasia- 1 some difficulty with naming such as being unable to name the parts of a watch (face/band, able to repeat simple but not complex sentences which may be more secondary to attention, did not name any of the objects on the left-hand side of the naming card (glove, key, hammock) 10: Test Dysarthria - 0  11: Test Extinction/Inattention - 1 appears to have some mild neglect of the left side, difficult to  fully assess via video   NIHSS score: 6   Imaging personally reviewed:  Head CT with significant chronic microvascular disease especially of the right hemisphere, progressed since prior but without clear acute process CTA obtained, negative for large vessel occlusion, without significant stenosis though there is scattered atherosclerosis, other than moderate to severe stenosis of the origin of the nondominant left vertebral artery CT perfusion with hypoperfusion of the right MCA territory watershed zones  Labs reviewed in epic and pertinent values follow:  Assessment: 75 year old gentleman with significant cardiac history as above presenting with intermittent transient neurological symptoms and imaging with a significant burden of chronic microvascular disease as well as known paroxysmal atrial fibrillation with recent subtherapeutic INR, headaches, vertigo, and telemetry neuro evaluation consistent with CT perfusion findings of right hemisphere watershed hypoperfusion.  Recommendations:  -MRI brain without contrast -Routine EEG -Permissive hypertension for now to 220/110 at least overnight or until full neurological evaluation completed -Hold anticoagulation pending MRI brain -Rate control as RVR may be contributing to hypoperfusion, medications per primary team, for example consider metoprolol in place of home carvedilol to reduce blood pressure effect while achieving rate control -Transfer to [facility] for full neurological evaluation  This patient is receiving care for possible acute neurological changes. There was >80 minutes of care by this provider at the time of service, including time for direct evaluation via telemedicine, review of medical records, imaging studies and discussion of findings with providers, the patient and/or family.  Lesleigh Noe MD-PhD [facility] [phone] If 8pm-8am, please page neurology on call as listed in [place].  Discussed with Dr.   Linda Hedges, Dr. Sabra Heck, Dr. Lorrin Goodell by telephone and secure chat

## 2020-07-15 NOTE — H&P (Signed)
History and Physical    Vincent Carter BOF:[id number] DOB: 08-Sep-1945 DOA: 07/15/2020  PCP: [name], MD (Confirm with patient/family/NH records and if not entered, this has to be entered at [facility] point of entry) Patient coming from: home  I have personally briefly reviewed patient's old medical records in [place]  Chief Complaint: acute mental status change, facial droop  HPI: Vincent Carter is a 75 y.o. male with medical history significant of cardiovascular disease s/p CABG x 2 , history of atrial fibrillation and flutter for which he is on Coumadin.  He presents to the hospital today with a complaint of altered mental status.  He is accompanied by his daughter-in-law and other family members who state that he was last seen normal around 230 PM, at that time the patient had been leaving the car running for some reason but was his normal self.  He went to get in the car to go into town, he drove up to place of business and sat in his car for 15 minutes, he was found to be confused, altered, had left-sided facial droop and some focal unilateral weakness although it is not clear what side was weak.  The patient denies any symptoms including headache chest pain or shortness of breath.  He still has some confusion, level 5 caveat applies secondary to altered mental status.  The daughter in law is at the bedside stating he is still confused and altered   ED Course: T 98  136/93  HR 103  RR 18. EDP exam with no focal neurologic findings except a very minor left facial droop, slow cognition and mild confusion. CT head w/o hemorrhage, w/o acute demarcation of infarction, question of acute white matter infarct left frontal lobe centrum ovale and corona radiata. CTA Neck - carotids clear, ASVD with mod/severe stenosis at origin of non-dominant left vertebral artery. CTA head w/o LVO, patchy hypoperfusion distribution of MCA-MCA/PCA noted. Lab with mild renal insufficiency w/ Cr 1.79 (1.3  03/30/18), Hgb 19. EKG with a fib/flutter, VPC, controlled rate. EDP discussed with Neuro-hospitalist at [facility] who recommended admission to [facility] for further evaluation.  Review of Systems: As per HPI otherwise 10 point review of systems negative. He does c/o vertigo but is non-specific. Caveat - confused historian.   Past Medical History:  Diagnosis Date   Arthritis    ASCVD (arteriosclerotic cardiovascular disease) cardiologist--  is now dr Roderic Palau branch (was dr [name])    CABG in 1997 -- LIMA to LAD;  cath 07-17-2012 PCI and BMStenting;   RE-DO CABG X4  in  07-24-2004 three-vessel disease; cath in 08-24-2004 post CABG ef 40-45% w/ anteroapical hypokinesis; occlusion SVG  to  RI; patent RIMA to LAD; unsuccessful PCI in totally occluded RI graft.;  last myoview 09-09-2013, no ischemia, moderate inferior wall defect, ef 49%   CHF (congestive heart failure) (HCC)    Chronic obstructive pulmonary disease ([place])    Diverticulosis of colon    Euthyroid goiter    with low TSH    History of colon polyps    hyperplastic 2011   History of diverticulitis    History of kidney stones    History of ST elevation myocardial infarction (STEMI)    07-17-2004   w/ extensive RV infact per cath report   HOH (hard of hearing)    no hearing aid   Hypertension    Low TSH level    Clinically euthyroid   Peripheral vascular disease (HCC)    Decreased  distal pulses and abnormal Doppler   Prostate cancer [facility]) urologist--  dr Irine Seal   T1c, Gleason 3+4=7,  PSA 4.71,  vol 38cc   Ptosis    Left, secondary to injury   S/p bare metal coronary artery stent    x3  to RCA  07-17-2004    Past Surgical History:  Procedure Laterality Date   [place]  08-24-2004  dr Lia Foyer   post re-do CABG/  Unsuccessful attempt PCI of clotted SVG to RI/  anteroapical segment severely hypokinetic,  ef 40-45%/  fresh occlusion SVG to RI,  continued patency of SVG to PAD and RIMA to LAD /    dLM 80%   CARDIOVASCULAR STRESS TEST  09-09-2013  dr Roderic Palau branch   no reversible ischemia,  moderately large defect noted in the inferior wall extending from  mid ventricle to apex also involves true ventricle apex and anteroapical wall/  hypokinesis of inferior wall in region of prior infarct/scarring/  LVEF 49%   CATARACT EXTRACTION W/ INTRAOCULAR LENS  IMPLANT, BILATERAL  2006   COLONOSCOPY W/ POLYPECTOMY  12-26-2009   CORONARY ANGIOPLASTY WITH STENT PLACEMENT  07-17-2004   dr Sabino Snipes   PCI w/ BMS x3 to RCA and balloon pump placement/  LVSF after revascularization , ef 65% w/ mild inferior hypokinesis/  dLM  70% extending into origins of LAD (70% ostial, 80% mid, D1 branch 90% ostial)  CFX (80% ostial OM, 70% after first branch) /   99% mRCA stented and no residual;  remains 80% origin of posterior LV branch/  most apical portion of PDA was embolized    CORONARY ARTERY BYPASS GRAFT  1997   dr Merleen Nicely   LIMA to LAD   CORONARY ARTERY BYPASS GRAFT  07-24-2004  dr gerhardt   RIMA to LAD,  SVG to OM,  SVG to PDA,  SVG to RI   EYE SURGERY Left 1996   repair ocular injury   ORIF RIGHT TIB-FIB FX  1990's   PROSTATE BIOPSY     RADIOACTIVE SEED IMPLANT N/A 05/02/2014   Procedure: RADIOACTIVE SEED IMPLANT;  Surgeon: Irine Seal, MD;  Location: [facility];  Service: Urology;  Laterality: N/A;   TONSILLECTOMY  age 27   TOTAL HIP ARTHROPLASTY Left 1983   RECONSTRUCTION/ THA/  SKIN AND BONE GRAFTS--  pt fell from 5 stories off [place]   TRANSTHORACIC ECHOCARDIOGRAM  [id number]   diastolic dysfunction/  ef 45-50%/  mild AV calcification without stenosis,  mild AR/  mild LAE and RAE/  trivial MR and TR      Soc Hx - married - divorced. 2nd marriage - widowed. Has one son and three step-children. Retired from [employer].    reports that he quit smoking about 2 years ago. His smoking use included cigarettes. He started smoking about 61 years  ago. He has a 55.00 pack-year smoking history. He has never used smokeless tobacco. He reports that he does not drink alcohol and does not use drugs.  Allergies  Allergen Reactions   Codeine Nausea Only    Pill form of codeine causes nausea   Statins Other (See Comments)    Myalgia     Family History  Problem Relation Age of Onset   Clotting disorder Mother    Heart attack Mother    Lung cancer Father    Cancer Brother        prostate  Prior to Admission medications   Medication Sig Start Date End Date Taking? Authorizing Provider  albuterol (VENTOLIN HFA) 108 (90 Base) MCG/ACT inhaler Inhale 2 puffs into the lungs every 6 (six) hours as needed for wheezing or shortness of breath.   Yes [provider]  Caffeine-Magnesium Salicylate (DIUREX PO) Take 50 mg by mouth 3 (three) times daily.   Yes [provider]  carvedilol (COREG) 12.5 MG tablet TAKE 1.5 TABLETS BY MOUTH TWICE DAILY. 05/27/20  Yes BranchAlphonse Guild, MD  Cholecalciferol (VITAMIN D3) 1.25 MG (50000 UT) CAPS Take 1 capsule by mouth daily.   Yes [provider]  dextromethorphan-guaiFENesin (MUCINEX DM) 30-600 MG 12hr tablet Take 1 tablet by mouth 2 (two) times daily. Patient taking differently: Take 1 tablet by mouth 2 (two) times daily as needed. 03/30/18  Yes Kathie Dike, MD  fluticasone (FLONASE) 50 MCG/ACT nasal spray Place 1 spray into both nostrils 2 (two) times daily. 03/15/18  Yes [provider]  hydrochlorothiazide (HYDRODIURIL) 25 MG tablet Take 1 tablet by mouth daily. 05/27/20  Yes [provider]  ipratropium-albuterol (DUONEB) 0.5-2.5 (3) MG/3ML SOLN Take 3 mLs by nebulization every 6 (six) hours as needed. 06/27/20  Yes Chesley Mires, MD  lisinopril (ZESTRIL) 2.5 MG tablet TAKE ONE TABLET BY MOUTH DAILY. 04/24/20  Yes Branch, Alphonse Guild, MD  montelukast (SINGULAIR) 10 MG tablet Take 1 tablet (10 mg total) by mouth at bedtime. 06/27/20  Yes Chesley Mires, MD   nitroGLYCERIN (NITROSTAT) 0.4 MG SL tablet PLACE 1 TABLET UNDER TONGUE EVERY 5 MINUTES AS NEEDED FOR CHEST PAIN. 03/07/20  Yes Branch, Alphonse Guild, MD  Omega-3 Fatty Acids (FISH OIL) 300 MG CAPS Take 2 capsules by mouth 2 (two) times daily.   Yes [provider]  warfarin (COUMADIN) 2.5 MG tablet Take warfarin extra 1 tablet (2.5mg ) today then increase dose to 1 tablet (2.5mg ) daily except 1 1/2 tablets (3.75mg ) on Mondays, Wednesdays and Fridays or as directed 07/08/20  Yes Branch, Alphonse Guild, MD  Nebulizer MISC Please provide 1 nebulizer machine and all supplies. Diagnosis: J44.1, Office number: [phone] 03/30/18   Kathie Dike, MD    Physical Exam: Vitals:   07/15/20 1830 07/15/20 1855 07/15/20 1910 07/15/20 1930  BP:  121/88 (!) 136/93 (!) 118/96  Pulse:  (!) 114 (!) 103 (!) 130  Resp: 16 20 18  (!) 29  Temp:      SpO2:  98% 94% 98%  Weight:      Height:        Vitals:   07/15/20 1830 07/15/20 1855 07/15/20 1910 07/15/20 1930  BP:  121/88 (!) 136/93 (!) 118/96  Pulse:  (!) 114 (!) 103 (!) 130  Resp: 16 20 18  (!) 29  Temp:      SpO2:  98% 94% 98%  Weight:      Height:       General: heavyset man in no distress but he is confused, stops talking mid-sentence and his attention wonders.  Eyes: PERRL, lids and conjunctivae normal ENMT: Mucous membranes are moist. Posterior pharynx clear of any exudate or lesions.Edentulous Neck: normal, supple, no masses, no thyromegaly Respiratory: clear to auscultation bilaterally, no wheezing, no crackles. Normal respiratory effort. No accessory muscle use.  Cardiovascular: IRIR with varying heart rate, no murmurs / rubs / gallops. No extremity edema. 2+ pedal pulses. No carotid bruits.  Abdomen: no tenderness, no masses palpated. No hepatosplenomegaly. Bowel sounds positive.  Musculoskeletal: no clubbing / cyanosis. No joint deformity upper and  lower extremities. Good ROM, no contractures. Normal muscle tone.  Skin: no rashes,  lesions, ulcers. No induration. Old scar distal left LE - circular indented area 5 cm diameter. Neurologic: CN 2-12: nl facial symmetry and movement, PERRLA, EOMI, nl grimace, no tongue fasciculations, HOH left. Intermittent cognitive impairment - doesn't complete sentences, answer questions.  Sensation intact, DTR normal. Strength 5/5 in all 4.  Psychiatric: Poor insight to his current condition, poor judgement. [place], oriented to person and place. Normal mood.     Labs on Admission: I have personally reviewed following labs and imaging studies  CBC: Recent Labs  Lab 07/15/20 1750 07/15/20 1753  WBC 9.1  --   NEUTROABS 6.9  --   HGB 18.3* 19.4*  HCT 54.2* 57.0*  MCV 89.7  --   PLT 142*  --    Basic Metabolic Panel: Recent Labs  Lab 07/15/20 1750 07/15/20 1753  NA 133* 137  K 3.3* 3.4*  CL 98 97*  CO2 26  --   GLUCOSE 107* 107*  BUN 18 18  CREATININE 1.67* 1.70*  CALCIUM 9.0  --    GFR: Estimated Creatinine Clearance: 31.7 mL/min (A) (by C-G formula based on SCr of 1.7 mg/dL (H)). Liver Function Tests: Recent Labs  Lab 07/15/20 1750  AST 14*  ALT 14  ALKPHOS 56  BILITOT 3.0*  PROT 6.7  ALBUMIN 4.0   No results for input(s): LIPASE, AMYLASE in the last 168 hours. No results for input(s): AMMONIA in the last 168 hours. Coagulation Profile: Recent Labs  Lab 07/15/20 1750  INR 1.7*   Cardiac Enzymes: No results for input(s): CKTOTAL, CKMB, CKMBINDEX, TROPONINI in the last 168 hours. BNP (last 3 results) No results for input(s): PROBNP in the last 8760 hours. HbA1C: No results for input(s): HGBA1C in the last 72 hours. CBG: No results for input(s): GLUCAP in the last 168 hours. Lipid Profile: No results for input(s): CHOL, HDL, LDLCALC, TRIG, CHOLHDL, LDLDIRECT in the last 72 hours. Thyroid Function Tests: No results for input(s): TSH, T4TOTAL, FREET4, T3FREE, THYROIDAB in the last 72 hours. Anemia Panel: No results for input(s): VITAMINB12, FOLATE,  FERRITIN, TIBC, IRON, RETICCTPCT in the last 72 hours. Urine analysis: No results found for: COLORURINE, APPEARANCEUR, LABSPEC, [place], GLUCOSEU, HGBUR, BILIRUBINUR, KETONESUR, PROTEINUR, UROBILINOGEN, NITRITE, LEUKOCYTESUR  Radiological Exams on Admission: CT HEAD CODE STROKE WO CONTRAST  Result Date: 07/15/2020 CLINICAL DATA:  Code stroke. Left-sided facial droop, confusion, weak arms, l difficulty walking, confusion. EXAM: CT HEAD WITHOUT CONTRAST TECHNIQUE: Contiguous axial images were obtained from the base of the skull through the vertex without intravenous contrast. COMPARISON:  Prior head CT 09/08/2013. FINDINGS: Brain: Mild to moderate generalized cerebral atrophy. Comparatively mild cerebellar atrophy. Patchy and ill-defined hypoattenuation within the cerebral white matter, nonspecific but most often secondary to chronic small vessel ischemia. Findings have progressed as compared to the head CT of 09/08/2013 white matter hypodensity is somewhat more prominent within the right frontal lobe centrum semiovale and corona radiata and a superimposed acute white matter infarct at this site is difficult to exclude. Chronic appearing lacunar infarcts within the deep gray nuclei, progressed from the prior head CT of 09/08/2013. Chronic small chronic infarct within the callosal body also new from the prior exam (series 6, image 25). No acute cortical infarct or acute intracranial hemorrhage is identified. No extra-axial fluid collection. No evidence of an intracranial mass. No midline shift. Vascular: No hyperdense vessel.  Atherosclerotic calcifications Skull: Normal. Negative for fracture or focal lesion. Sinuses/Orbits:  Visualized orbits show no acute finding. No significant paranasal sinus disease at the imaged levels. ASPECTS ([place] Stroke Program Early CT Score) - Ganglionic level infarction (caudate, lentiform nuclei, internal capsule, insula, M1-M3 cortex): 7 - Supraganglionic infarction (M4-M6  cortex): 3 Total score (0-10 with 10 being normal): 10 These results were called by telephone at the time of interpretation on 07/15/2020 at 6:17 pm to provider [facility] , who verbally acknowledged these results. IMPRESSION: No acute intracranial hemorrhage or acute demarcated cortical infarction. Patchy and ill-defined hypoattenuation within the cerebral white matter compatible with chronic small vessel ischemic disease. Findings have progressed as compared to the prior head CT of 09/08/2013. White matter hypodensity is somewhat more prominent within the right frontal lobe centrum semiovale and corona radiata, and a superimposed acute white matter infarct is difficult to definitively exclude. A brain MRI may be obtained for further evaluation, as clinically warranted. Chronic appearing lacunar infarcts within the deep gray nuclei, progressed in number from the head CT of 09/08/2013. A small chronic infarct within the callosal body is new from the prior exam. Progressive parenchymal atrophy. Electronically Signed   By: Kellie Simmering DO   On: 07/15/2020 18:17   CT ANGIO HEAD NECK W WO CM W PERF (CODE STROKE)  Result Date: 07/15/2020 CLINICAL DATA:  Neuro deficit, acute, stroke suspected. Left-sided facial droop, difficulty walking. Confusion. EXAM: CT ANGIOGRAPHY HEAD AND NECK CT PERFUSION BRAIN TECHNIQUE: Multidetector CT imaging of the head and neck was performed using the standard protocol during bolus administration of intravenous contrast. Multiplanar CT image reconstructions and MIPs were obtained to evaluate the vascular anatomy. Carotid stenosis measurements (when applicable) are obtained utilizing NASCET criteria, using the distal internal carotid diameter as the denominator. Multiphase CT imaging of the brain was performed following IV bolus contrast injection. Subsequent parametric perfusion maps were calculated using RAPID software. CONTRAST:  191mL OMNIPAQUE IOHEXOL 350 MG/ML SOLN COMPARISON:   Noncontrast head CT performed earlier today 07/15/2020. FINDINGS: CTA NECK FINDINGS Aortic arch: Standard aortic branching. Atherosclerotic plaque within the visualized aortic arch and proximal major branch vessels of the neck. Right carotid system: CCA and ICA patent within neck without hemodynamically significant stenosis (50% or greater). Mild soft and calcified plaque within the carotid bifurcation and proximal ICA. Mild soft and calcified plaque is also present within the mid to distal cervical ICA. Left carotid system: CCA and ICA patent within the neck without hemodynamically significant stenosis (50% or greater) mild to moderate soft and calcified plaque within the distal CCA, carotid bifurcation and proximal ICA. Mild soft and calcified plaques also present within the mid cervical ICA. Vertebral arteries: Vertebral arteries patent within the neck. The right vertebral artery is dominant. No hemodynamically significant stenosis within the right vertebral artery within the neck. Moderate/severe atherosclerotic stenosis at the origin of the non dominant left vertebral artery. Skeleton: Cervical spondylosis. No acute bony abnormality or aggressive osseous lesion. Other neck: No neck mass or cervical lymphadenopathy. Upper chest: Prior median sternotomy. No consolidation within the imaged lung apices. Review of the MIP images confirms the above findings CTA HEAD FINDINGS Anterior circulation: The intracranial internal carotid arteries are patent. Calcified plaque within both vessels with no more than mild stenosis. The M1 middle cerebral arteries are patent. Atherosclerotic irregularity of the M2 and more distal middle cerebral artery branches bilaterally. However, no M2 proximal branch occlusion or high-grade proximal M2 stenosis is identified. The anterior cerebral arteries are patent. No intracranial aneurysm is identified. Posterior circulation: The intracranial vertebral  arteries are patent. Calcified plaque  within the V4 left vertebral artery with no more than mild stenosis. Nonstenotic calcified plaque within the V4 right vertebral artery. The basilar artery is patent. The posterior cerebral arteries are patent. Posterior communicating arteries are present bilaterally. Venous sinuses: Within the limitations of contrast timing, no convincing thrombus. Anatomic variants: None significant Review of the MIP images confirms the above findings CT Brain Perfusion Findings: CBF (<30%) Volume: 44mL Perfusion (Tmax>6.0s) volume: 32mL (right MCA vascular territory and right MCA/PCA watershed territory). Mismatch Volume: 13 mLmL Infarction Location:None identified These results were communicated to Dr. Curly Shores At 7:18 pmon 07/15/2020 by text page via the [facility] messaging system. IMPRESSION: CTA neck: 1. The common carotid and internal carotid arteries are patent within the neck without hemodynamically significant stenosis. Atherosclerotic disease within both carotid systems, as described. 2. Vertebral arteries patent within the neck. Moderate/severe atherosclerotic stenosis at the origin of the non-dominant left vertebral artery. CTA head: Intracranial atherosclerotic disease, as described. No intracranial large vessel occlusion or proximal high-grade arterial stenosis is identified. CT perfusion head: The perfusion software identifies patchy foci of hypoperfusion within the right cerebral hemisphere (MCA territory and MCA/PCA watershed territory) totaling 13 mL (utilizing the Tmax>6 seconds threshold). Reported mismatch 13 mL. Electronically Signed   By: Kellie Simmering DO   On: 07/15/2020 19:19    EKG: Independently reviewed. A fib/flutter with rate read out at 113. No acute changes.  Assessment/Plan Active Problems:   CVA (cerebral vascular accident) ([place])   PAF (paroxysmal atrial fibrillation) ([place])   Hypertension   Emphysema lung ([place])   ASCVD (arteriosclerotic cardiovascular disease)    CVA - patient with altered  behavior and cognition, transient facial droop. Continue intermittent cognitive changes. CT head w/o hemorrhage, with hypoperfusion frontal at centrum ovale and right MCA-MCA/PDA area. No LVO, carotids open, moderate to severe stenosis at origin of left veterbral artery. Dr. Curly Shores for neurology has seen patient via tele-neurology. Recommendations for transfer to [facility] for further evaluation.  Plan Transfer to [facility] medical tele bed MRI brain  EEG  Permissive hypertension - ACE, diuretic held  Warfarin held  Standard code stroke and stroke admission orders  2. PAF - patient in A fib/flutter. Dr. Curly Shores concerned that hypoperfusion may be exacerbated by RVR.  Plan Hold ACE/diuretic  D/c coreg  Start Metoprolol 25 mg bid  Metoprolol 2.5 mg IV q 2 for sustained heart rate > 120  3. HTN-   Plan Hold meds and allow mild hypertension in light of hypoperfusion  Metoprolol as in #2  4. COPD/emphysema - continue home inhalational regimen.   5. Headache- hold caffeine products    DVT prophylaxis: SCDs   Code Status: full code  Family Communication: spoke with Tawni Millers, daughter. She is aware of the dx and tx plan including transfer to [facility] hospital.  Disposition Plan: TBD ( Consults called: Neurology - Dr. Curly Shores  Admission status: inpatient - medical telemetry    Adella Hare MD [facility] Pager [phone]  If 7PM-7AM, please contact night-coverage [email or web address] Password [facility]  07/15/2020, 7:56 PM

## 2020-07-15 NOTE — Progress Notes (Signed)
ANTICOAGULATION CONSULT NOTE - Initial Up Consult   Pharmacy Consult for warfarin dosing  Indication: atrial fibrillation and CVA   Allergies  Allergen Reactions   Codeine Nausea Only    Pill form of codeine causes nausea   Statins Other (See Comments)    Myalgia       Patient Measurements: Last Weight  Most recent update: 07/15/2020  5:49 PM    Weight  69.9 kg (154 lb 1.6 oz)            Body mass index is 28.19 kg/m. Vincent Carter               Temp: 98 F (36.7 C) (07/05 1748) BP: 104/83 (07/05 2000) Pulse Rate: 130 (07/05 1930)  Labs: Recent Labs    07/15/20 1750 07/15/20 1753  HGB 18.3* 19.4*  HCT 54.2* 57.0*  PLT 142*  --   APTT 42*  --   LABPROT 20.3*  --   INR 1.7*  --   CREATININE 1.67* 1.70*    Estimated Creatinine Clearance: 31.7 mL/min (A) (by C-G formula based on SCr of 1.7 mg/dL (H)).     Medications:  (Not in a hospital admission)  Scheduled:    stroke: mapping our early stages of recovery book   Does not apply Once   Caffeine-Magnesium Salicylate   Oral TID   [START ON 07/16/2020] carvedilol  3.125 mg Oral BID WC   Fish Oil  2 capsule Oral BID   fluticasone  1 spray Each Nare BID   montelukast  10 mg Oral QHS   senna-docusate  1 tablet Oral QHS   Vitamin D3  1 capsule Oral Daily   [START ON 07/16/2020] warfarin  2.5 mg Oral q1600   Infusions:   sodium chloride     PRN: acetaminophen **OR** acetaminophen (TYLENOL) oral liquid 160 mg/5 mL **OR** acetaminophen, albuterol, dextromethorphan-guaiFENesin, ipratropium-albuterol, nitroGLYCERIN Anti-infectives (From admission, onward)    None       Goal of Therapy:  INR 2-3 Monitor platelets by anticoagulation protocol: Yes    Prior to Admission Warfarin Dosing:  Vincent Carter takes 2.5mg  of warfarin Tues Thurs Sa Sun and 3.75mg  of warfarin MWF     Admit INR was 1.7 Lab Results  Component Value Date   INR 1.7 (H) 07/15/2020   INR 1.6 (A) 07/08/2020   INR 1.6 (A)  06/17/2020    Assessment: Vincent Carter a 75 y.o. male requires anticoagulation with warfarin for the indication of  atrial fibrillation. Warfarin will be initiated inpatient following pharmacy protocol per pharmacy consult. Patient most recent blood work is as follows: CBC Latest Ref Rng & Units 07/15/2020 07/15/2020 03/24/2018  WBC 4.0 - 10.5 K/uL - 9.1 8.5  Hemoglobin 13.0 - 17.0 g/dL 19.4(H) 18.3(H) 17.0  Hematocrit 39.0 - 52.0 % 57.0(H) 54.2(H) 53.3(H)  Platelets 150 - 400 K/uL - 142(L) 152     Plan: Warfarin 5mg  po once  Monitor CBC daily with am labs   Monitor INR daily Monitor for signs and symptoms of bleeding   Donna Christen Skeeter Sheard, PharmD, MBA, BCGP Clinical Pharmacist

## 2020-07-15 NOTE — ED Triage Notes (Signed)
BIB family after sitting in car approx 30 minutes, then became confused at approx 1630. L side facial droop, no R grip per family. [place] 1400 pm.

## 2020-07-16 ENCOUNTER — Inpatient Hospital Stay (HOSPITAL_COMMUNITY)
Admit: 2020-07-16 | Discharge: 2020-07-16 | Disposition: A | Discharge status: Home / Self Care | Payer: Medicare Other | Attending: Neurology | Admitting: Neurology

## 2020-07-16 ENCOUNTER — Inpatient Hospital Stay (HOSPITAL_COMMUNITY): Payer: Medicare Other

## 2020-07-16 DIAGNOSIS — R29818 Other symptoms and signs involving the nervous system: Secondary | ICD-10-CM

## 2020-07-16 DIAGNOSIS — I639 Cerebral infarction, unspecified: Secondary | ICD-10-CM

## 2020-07-16 DIAGNOSIS — I6389 Other cerebral infarction: Secondary | ICD-10-CM

## 2020-07-16 LAB — FERRITIN: Ferritin: 264 ng/mL (ref 24–336)

## 2020-07-16 LAB — ECHOCARDIOGRAM COMPLETE
AR max vel: 2.22 cm2
AV Area VTI: 2.08 cm2
AV Area mean vel: 1.97 cm2
AV Mean grad: 3 mmHg
AV Peak grad: 5.7 mmHg
Ao pk vel: 1.19 m/s
Area-P 1/2: 4.71 cm2
Calc EF: 39.2 %
Height: 62 in
MV VTI: 1.79 cm2
P 1/2 time: 567 msec
S' Lateral: 3.28 cm
Single Plane A2C EF: 35.8 %
Single Plane A4C EF: 48.2 %
Weight: 2465.62 oz

## 2020-07-16 LAB — LIPID PANEL
Cholesterol: 86 mg/dL (ref 0–200)
HDL: 31 mg/dL — ABNORMAL LOW (ref >40)
LDL Cholesterol: 36 mg/dL (ref 0–99)
Total CHOL/HDL Ratio: 2.8 RATIO
Triglycerides: 93 mg/dL (ref <150)
VLDL: 19 mg/dL (ref 0–40)

## 2020-07-16 MED ORDER — POTASSIUM CHLORIDE 10 MEQ/100ML IV SOLN
10.0000 meq | INTRAVENOUS | Status: AC
  Administered 2020-07-16 (×2): 10 meq via INTRAVENOUS
  Filled 2020-07-16 (×2): qty 100

## 2020-07-16 MED ORDER — WARFARIN - PHARMACIST DOSING INPATIENT: Freq: Every day | Status: DC

## 2020-07-16 MED ORDER — WARFARIN SODIUM 5 MG PO TABS
5.0000 mg | ORAL_TABLET | ORAL | Status: AC
  Administered 2020-07-17: 5 mg via ORAL
  Filled 2020-07-16: qty 1

## 2020-07-16 MED ORDER — LORAZEPAM 2 MG/ML IJ SOLN: 1.0000 mg | Freq: Once | INTRAMUSCULAR | Status: DC | PRN

## 2020-07-16 NOTE — ED Notes (Addendum)
Patient ambulatory to bedside commode with complaints of diarrhea. New bed linen, gown and chuck pad given to patient.

## 2020-07-16 NOTE — Progress Notes (Signed)
PROGRESS NOTE    Vincent Carter  PPJ:[id number] DOB: 1945-10-30 Vincent Carter PCP: [name], Vincent Carter   Brief Narrative:   Vincent Carter is a 75 y.o. male with medical history significant of cardiovascular disease s/p CABG x 2 , history of atrial fibrillation and flutter for which he is on Coumadin.  He presents to the hospital today with a complaint of altered mental status along with left-sided facial droop and some focal unilateral weakness.  Patient was admitted for evaluation of CVA with suspicion of right cerebral watershed infarct.  He is also noted to be COVID-positive and has been started on remdesivir with complaints of cough in the last couple weeks.  Assessment & Plan:   Active Problems:   Hypertension   Emphysema lung (HCC)   ASCVD (arteriosclerotic cardiovascular disease)   PAF (paroxysmal atrial fibrillation) (HCC)   CVA (cerebral vascular accident) ([place])   CVA -Suspicious of right cerebral watershed infarction based on CT perfusion -Brain MRI with no acute findings -Needs inpatient consultation and evaluation by neurology for which she will need transfer to [facility] -Warfarin currently held -Routine EEG will be ordered  COVID infection -Remdesivir started -Isolation precautions -No need for steroids as patient is currently not hypoxemic -Check chest x-ray -Follow inflammatory markers  Mild hypokalemia -Replete and reevaluate  History of atrial fibrillation on Coumadin -Chronic findings noted with microhemorrhages -Plan to hold Coumadin for now -IV metoprolol will be ordered for as needed rate control  History of hypertension -Holding antihypertensives, allow for permissive hypertension -Metoprolol 25 mg twice daily as hypoperfusion may be exacerbated by RVR  History of CAD status post CABG/PVD -Continue on metoprolol as noted  History of COPD -Continue home breathing treatments as needed   DVT prophylaxis: SCDs Code Status:  DNI Family Communication: None at bedside, daughter is aware of the plan to transfer to [facility] Disposition Plan:  Status is: Inpatient  Remains inpatient appropriate because:Ongoing diagnostic testing needed not appropriate for outpatient work up and IV treatments appropriate due to intensity of illness or inability to take PO  Dispo: The patient is from: Home              Anticipated d/c is to: Home              Patient currently is not medically stable to d/c.   Difficult to place patient No  Consultants:  Teleneurology  Procedures:  See below  Antimicrobials:  None   Subjective: Patient seen and evaluated today with no new acute complaints or concerns. No acute concerns or events noted overnight.  Objective: Vitals:   07/16/20 0400 07/16/20 0445 07/16/20 0645 07/16/20 0700  BP: 111/69 115/75 100/70 97/75  Pulse: 83  85 85  Resp: 17 (!) 25 (!) 27 15  Temp:      SpO2: 92%  98% 95%  Weight:      Height:        Intake/Output Summary (Last 24 hours) at 07/16/2020 0952 Last data filed at 07/16/2020 0221 Gross per 24 hour  Intake 328.34 ml  Output --  Net 328.34 ml   Filed Weights   07/15/20 1749  Weight: 69.9 kg    Examination:  General exam: Appears calm and comfortable  Respiratory system: Clear to auscultation. Respiratory effort normal. Cardiovascular system: S1 & S2 heard, RRR.  Gastrointestinal system: Abdomen is soft Central nervous system: Alert and awake Extremities: No edema Skin: No significant lesions noted Psychiatry: Flat affect.    Data  Reviewed: I have personally reviewed following labs and imaging studies  CBC: Recent Labs  Lab 07/15/20 1750 07/15/20 1753  WBC 9.1  --   NEUTROABS 6.9  --   HGB 18.3* 19.4*  HCT 54.2* 57.0*  MCV 89.7  --   PLT 142*  --    Basic Metabolic Panel: Recent Labs  Lab 07/15/20 1750 07/15/20 1753  NA 133* 137  K 3.3* 3.4*  CL 98 97*  CO2 26  --   GLUCOSE 107* 107*  BUN 18 18  CREATININE  1.67* 1.70*  CALCIUM 9.0  --    GFR: Estimated Creatinine Clearance: 31.7 mL/min (A) (by C-G formula based on SCr of 1.7 mg/dL (H)). Liver Function Tests: Recent Labs  Lab 07/15/20 1750  AST 14*  ALT 14  ALKPHOS 56  BILITOT 3.0*  PROT 6.7  ALBUMIN 4.0   No results for input(s): LIPASE, AMYLASE in the last 168 hours. No results for input(s): AMMONIA in the last 168 hours. Coagulation Profile: Recent Labs  Lab 07/15/20 1750  INR 1.7*   Cardiac Enzymes: No results for input(s): CKTOTAL, CKMB, CKMBINDEX, TROPONINI in the last 168 hours. BNP (last 3 results) No results for input(s): PROBNP in the last 8760 hours. HbA1C: No results for input(s): HGBA1C in the last 72 hours. CBG: No results for input(s): GLUCAP in the last 168 hours. Lipid Profile: Recent Labs    07/16/20 0330  CHOL 86  HDL 31*  LDLCALC 36  TRIG 93  CHOLHDL 2.8   Thyroid Function Tests: No results for input(s): TSH, T4TOTAL, FREET4, T3FREE, THYROIDAB in the last 72 hours. Anemia Panel: Recent Labs    07/15/20 2234  FERRITIN 264   Sepsis Labs: No results for input(s): PROCALCITON, LATICACIDVEN in the last 168 hours.  Recent Results (from the past 240 hour(s))  Resp Panel by RT-PCR (Flu A&B, Covid) Nasopharyngeal Swab     Status: Abnormal   Collection Time: 07/15/20  6:12 PM   Specimen: Nasopharyngeal Swab; Nasopharyngeal(NP) swabs in vial transport medium  Result Value Ref Range Status   SARS Coronavirus 2 by RT PCR POSITIVE (A) NEGATIVE Final    Comment: RESULT CALLED TO, READ BACK BY AND VERIFIED WITH: GESELL,C @ 1951 ON 07/15/20 BY JUW (NOTE) SARS-CoV-2 target nucleic acids are DETECTED.  The SARS-CoV-2 RNA is generally detectable in upper respiratory specimens during the acute phase of infection. Positive results are indicative of the presence of the identified virus, but do not rule out bacterial infection or co-infection with other pathogens not detected by the test. Clinical  correlation with patient history and other diagnostic information is necessary to determine patient infection status. The expected result is Negative.  Fact Sheet for Patients: EntrepreneurPulse.com.au  Fact Sheet for Healthcare Providers: IncredibleEmployment.be  This test is not yet approved or cleared by the [place] FDA and  has been authorized for detection and/or diagnosis of SARS-CoV-2 by FDA under an Emergency Use Authorization (EUA).  This EUA will remain in effect (meaning this test can be  used) for the duration of  the COVID-19 declaration under Section 564(b)(1) of the Act, 21 U.S.C. section 360bbb-3(b)(1), unless the authorization is terminated or revoked sooner.     Influenza A by PCR NEGATIVE NEGATIVE Final   Influenza B by PCR NEGATIVE NEGATIVE Final    Comment: (NOTE) The Xpert Xpress SARS-CoV-2/FLU/RSV plus assay is intended as an aid in the diagnosis of influenza from Nasopharyngeal swab specimens and should not be used as a sole basis  for treatment. Nasal washings and aspirates are unacceptable for Xpert Xpress SARS-CoV-2/FLU/RSV testing.  Fact Sheet for Patients: EntrepreneurPulse.com.au  Fact Sheet for Healthcare Providers: IncredibleEmployment.be  This test is not yet approved or cleared by the [place] FDA and has been authorized for detection and/or diagnosis of SARS-CoV-2 by FDA under an Emergency Use Authorization (EUA). This EUA will remain in effect (meaning this test can be used) for the duration of the COVID-19 declaration under Section 564(b)(1) of the Act, 21 U.S.C. section 360bbb-3(b)(1), unless the authorization is terminated or revoked.  Performed at [facility], [address], [place], [place] [address]          Radiology Studies: MR BRAIN WO CONTRAST  Result Date: 07/16/2020 CLINICAL DATA:  Neuro deficit, acute, stroke suspected. Additional  history provided: Confusion, altered, left-sided facial droop and some focal unilateral weakness. EXAM: MRI HEAD WITHOUT CONTRAST TECHNIQUE: Multiplanar, multiecho pulse sequences of the brain and surrounding structures were obtained without intravenous contrast. COMPARISON:  Noncontrast head CT and CT angiogram head/neck 07/15/2020. FINDINGS: Brain: The patient was unable to tolerate the full examination. As a result, the following sequences were obtained: Axial diffusion-weighted imaging, coronal diffusion-weighted imaging, sagittal T1 weighted sequence, axial T2 sequence, axial T2* sequence. The sagittal T1 weighted sequence is mildly motion degraded. Mild-to-moderate generalized cerebral atrophy. Comparatively mild cerebellar atrophy. Chronic lacunar infarcts within the bilateral corona radiata, basal ganglia, thalami and within the pons. Background moderate multifocal T2/FLAIR hyperintensity within the cerebral white matter, nonspecific but compatible with chronic small vessel ischemic disease. Mild background chronic small vessel ischemic changes are also present within the pons. Small chronic infarcts within the bilateral cerebellar hemispheres. There are a few scattered chronic microhemorrhages within the bilateral basal ganglia and thalami. There is no acute infarct. No evidence of an intracranial mass. No extra-axial fluid collection. No midline shift. Vascular: Expected proximal arterial flow voids. Skull and upper cervical spine: No focal suspicious marrow lesion. Incompletely assessed cervical spondylosis. Sinuses/Orbits: Visualized orbits show no acute finding. Bilateral lens replacements. Mild bilateral maxillary sinus mucosal thickening. IMPRESSION: Prematurely terminated examination, as described. No evidence of acute intracranial abnormality. Chronic lacunar infarcts within the bilateral corona radiata, basal ganglia, thalami and within the pons. Background chronic small vessel ischemic changes  which are moderate in the cerebral white matter, and mild in the pons. Small chronic infarcts within the bilateral cerebellar hemispheres. Central predominant chronic microhemorrhages, likely reflecting sequela of hypertensive microangiopathy. Mild-to-moderate generalized cerebral atrophy with comparatively mild cerebellar atrophy. Electronically Signed   By: Kellie Simmering DO   On: 07/16/2020 07:46   CT HEAD CODE STROKE WO CONTRAST  Result Date: Carter CLINICAL DATA:  Code stroke. Left-sided facial droop, confusion, weak arms, l difficulty walking, confusion. EXAM: CT HEAD WITHOUT CONTRAST TECHNIQUE: Contiguous axial images were obtained from the base of the skull through the vertex without intravenous contrast. COMPARISON:  Prior head CT 09/08/2013. FINDINGS: Brain: Mild to moderate generalized cerebral atrophy. Comparatively mild cerebellar atrophy. Patchy and ill-defined hypoattenuation within the cerebral white matter, nonspecific but most often secondary to chronic small vessel ischemia. Findings have progressed as compared to the head CT of 09/08/2013 white matter hypodensity is somewhat more prominent within the right frontal lobe centrum semiovale and corona radiata and a superimposed acute white matter infarct at this site is difficult to exclude. Chronic appearing lacunar infarcts within the deep gray nuclei, progressed from the prior head CT of 09/08/2013. Chronic small chronic infarct within the callosal body also new from the prior  exam (series 6, image 25). No acute cortical infarct or acute intracranial hemorrhage is identified. No extra-axial fluid collection. No evidence of an intracranial mass. No midline shift. Vascular: No hyperdense vessel.  Atherosclerotic calcifications Skull: Normal. Negative for fracture or focal lesion. Sinuses/Orbits: Visualized orbits show no acute finding. No significant paranasal sinus disease at the imaged levels. ASPECTS (G[place]ard Stroke Program Early CT Score) -  Ganglionic level infarction (caudate, lentiform nuclei, internal capsule, insula, M1-M3 cortex): 7 - Supraganglionic infarction (M4-M6 cortex): 3 Total score (0-10 with 10 being normal): 10 These results were called by telephone at the time of interpretation on Carter at 6:17 pm to provider [facility] , who verbally acknowledged these results. IMPRESSION: No acute intracranial hemorrhage or acute demarcated cortical infarction. Patchy and ill-defined hypoattenuation within the cerebral white matter compatible with chronic small vessel ischemic disease. Findings have progressed as compared to the prior head CT of 09/08/2013. White matter hypodensity is somewhat more prominent within the right frontal lobe centrum semiovale and corona radiata, and a superimposed acute white matter infarct is difficult to definitively exclude. A brain MRI may be obtained for further evaluation, as clinically warranted. Chronic appearing lacunar infarcts within the deep gray nuclei, progressed in number from the head CT of 09/08/2013. A small chronic infarct within the callosal body is new from the prior exam. Progressive parenchymal atrophy. Electronically Signed   By: Kellie Simmering DO   On: 07/15/2020 18:17   CT ANGIO HEAD NECK W WO CM W PERF (CODE STROKE)  Result Date: Carter CLINICAL DATA:  Neuro deficit, acute, stroke suspected. Left-sided facial droop, difficulty walking. Confusion. EXAM: CT ANGIOGRAPHY HEAD AND NECK CT PERFUSION BRAIN TECHNIQUE: Multidetector CT imaging of the head and neck was performed using the standard protocol during bolus administration of intravenous contrast. Multiplanar CT image reconstructions and MIPs were obtained to evaluate the vascular anatomy. Carotid stenosis measurements (when applicable) are obtained utilizing NASCET criteria, using the distal internal carotid diameter as the denominator. Multiphase CT imaging of the brain was performed following IV bolus contrast injection.  Subsequent parametric perfusion maps were calculated using RAPID software. CONTRAST:  150mL OMNIPAQUE IOHEXOL 350 MG/ML SOLN COMPARISON:  Noncontrast head CT performed earlier today 07/15/2020. FINDINGS: CTA NECK FINDINGS Aortic arch: Standard aortic branching. Atherosclerotic plaque within the visualized aortic arch and proximal major branch vessels of the neck. Right carotid system: CCA and ICA patent within neck without hemodynamically significant stenosis (50% or greater). Mild soft and calcified plaque within the carotid bifurcation and proximal ICA. Mild soft and calcified plaque is also present within the mid to distal cervical ICA. Left carotid system: CCA and ICA patent within the neck without hemodynamically significant stenosis (50% or greater) mild to moderate soft and calcified plaque within the distal CCA, carotid bifurcation and proximal ICA. Mild soft and calcified plaques also present within the mid cervical ICA. Vertebral arteries: Vertebral arteries patent within the neck. The right vertebral artery is dominant. No hemodynamically significant stenosis within the right vertebral artery within the neck. Moderate/severe atherosclerotic stenosis at the origin of the non dominant left vertebral artery. Skeleton: Cervical spondylosis. No acute bony abnormality or aggressive osseous lesion. Other neck: No neck mass or cervical lymphadenopathy. Upper chest: Prior median sternotomy. No consolidation within the imaged lung apices. Review of the MIP images confirms the above findings CTA HEAD FINDINGS Anterior circulation: The intracranial internal carotid arteries are patent. Calcified plaque within both vessels with no more than mild stenosis. The M1 middle cerebral arteries  are patent. Atherosclerotic irregularity of the M2 and more distal middle cerebral artery branches bilaterally. However, no M2 proximal branch occlusion or high-grade proximal M2 stenosis is identified. The anterior cerebral arteries  are patent. No intracranial aneurysm is identified. Posterior circulation: The intracranial vertebral arteries are patent. Calcified plaque within the V4 left vertebral artery with no more than mild stenosis. Nonstenotic calcified plaque within the V4 right vertebral artery. The basilar artery is patent. The posterior cerebral arteries are patent. Posterior communicating arteries are present bilaterally. Venous sinuses: Within the limitations of contrast timing, no convincing thrombus. Anatomic variants: None significant Review of the MIP images confirms the above findings CT Brain Perfusion Findings: CBF (<30%) Volume: 29mL Perfusion (Tmax>6.0s) volume: 71mL (right MCA vascular territory and right MCA/PCA watershed territory). Mismatch Volume: 13 mLmL Infarction Location:None identified These results were communicated to Dr. Curly Shores At 7:18 pmon Carter by text page via the [facility] messaging system. IMPRESSION: CTA neck: 1. The common carotid and internal carotid arteries are patent within the neck without hemodynamically significant stenosis. Atherosclerotic disease within both carotid systems, as described. 2. Vertebral arteries patent within the neck. Moderate/severe atherosclerotic stenosis at the origin of the non-dominant left vertebral artery. CTA head: Intracranial atherosclerotic disease, as described. No intracranial large vessel occlusion or proximal high-grade arterial stenosis is identified. CT perfusion head: The perfusion software identifies patchy foci of hypoperfusion within the right cerebral hemisphere (MCA territory and MCA/PCA watershed territory) totaling 13 mL (utilizing the Tmax>6 seconds threshold). Reported mismatch 13 mL. Electronically Signed   By: Kellie Simmering DO   On: 07/15/2020 19:19        Scheduled Meds:   stroke: mapping our early stages of recovery book   Does not apply Once   cholecalciferol  1,000 Units Oral Daily   fluticasone  1 spray Each Nare BID   metoprolol tartrate   25 mg Oral BID   montelukast  10 mg Oral QHS   omega-3 acid ethyl esters  1,000 mg Oral BID   senna-docusate  1 tablet Oral QHS   Continuous Infusions:  sodium chloride 50 mL/hr at 07/16/20 0221   remdesivir 100 mg in NS 100 mL       LOS: 1 day    Time spent: 35 minutes    Emmalene Kattner Darleen Crocker, DO [facility]  If 7PM-7AM, please contact night-coverage [email or web address] 07/16/2020, 9:52 AM

## 2020-07-16 NOTE — Progress Notes (Signed)
ANTICOAGULATION CONSULT NOTE - Initial Consult  Pharmacy Consult for Warfarin Indication: atrial fibrillation  Allergies  Allergen Reactions   Codeine Nausea Only    Pill form of codeine causes nausea   Statins Other (See Comments)    Myalgia     Patient Measurements: Height: 5\' 2"  (157.5 cm) Weight: 69.8 kg (153 lb 14.1 oz) IBW/kg (Calculated) : 54.6   Vital Signs: Temp: 98.2 F (36.8 C) (07/06 2055) Temp Source: Oral (07/06 2055) BP: 119/75 (07/06 2055) Pulse Rate: 99 (07/06 2055)  Labs: Recent Labs    07/15/20 1750 07/15/20 1753  HGB 18.3* 19.4*  HCT 54.2* 57.0*  PLT 142*  --   APTT 42*  --   LABPROT 20.3*  --   INR 1.7*  --   CREATININE 1.67* 1.70*    Estimated Creatinine Clearance: 31.7 mL/min (A) (by C-G formula based on SCr of 1.7 mg/dL (H)).   Medical History: Past Medical History:  Diagnosis Date   Arthritis    ASCVD (arteriosclerotic cardiovascular disease) cardiologist--  is now dr Roderic Palau branch (was dr [name])    CABG in 1997 -- LIMA to LAD;  cath 07-17-2012 PCI and BMStenting;   RE-DO CABG X4  in  07-24-2004 three-vessel disease; cath in 08-24-2004 post CABG ef 40-45% w/ anteroapical hypokinesis; occlusion SVG  to  RI; patent RIMA to LAD; unsuccessful PCI in totally occluded RI graft.;  last myoview 09-09-2013, no ischemia, moderate inferior wall defect, ef 49%   CHF (congestive heart failure) (HCC)    Chronic obstructive pulmonary disease ([place])    Diverticulosis of colon    Euthyroid goiter    with low TSH    History of colon polyps    hyperplastic 2011   History of diverticulitis    History of kidney stones    History of ST elevation myocardial infarction (STEMI)    07-17-2004   w/ extensive RV infact per cath report   HOH (hard of hearing)    no hearing aid   Hypertension    Low TSH level    Clinically euthyroid   Peripheral vascular disease (HCC)    Decreased distal pulses and abnormal Doppler   Prostate cancer [facility]) urologist--   dr Irine Seal   T1c, Gleason 3+4=7,  PSA 4.71,  vol 38cc   Ptosis    Left, secondary to injury   S/p bare metal coronary artery stent    x3  to RCA  07-17-2004    Assessment: Vincent Carter presenting with left sided facial droop and neglect, hx of afib on warfarin PTA with last dose 7/5 and INR on admission 1.7, MRI negative and OK per neuro to resume warfarin  PTA dosing: 2.5mg  daily except 3.75mg  MWF  Goal of Therapy:  INR 2-3 Monitor platelets by anticoagulation protocol: Yes   Plan:  Warfarin 5mg  PO x 1 tonight Daily INR, s/s bleeding  Bertis Ruddy, PharmD Clinical Pharmacist ED Pharmacist Phone # [phone] 07/16/2020 11:23 PM

## 2020-07-16 NOTE — ED Notes (Signed)
Report given to [facility], RN with [place] at this time. Report also given to Donato Heinz with [facility] who states they have not left the facility yet.

## 2020-07-16 NOTE — Progress Notes (Signed)
NEUROLOGY CONSULTATION PROGRESS NOTE   Date of service: July 16, 2020 Patient Name: Vincent Carter MRN:  [id number] DOB:  1945-11-07  Brief HPI   Vincent Carter is a 75 y.o. 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 Curly Shores for intermittent left sided neglect.  He was essentially found confused with a left facial droop by family at 1600 on 07/15/20 outside a restaurant. LKW is somewhat unclear. Had been reporting headaches in AM. Left facial droop had resolved in the ED and headaches were improved.  Workup with CT Head and CTA with no large territory infarct, no mismatch. CT Perfusion with a 13cc mismatch in the R MCA and MCA/PCA watershed distribution.  He was also found to be Covid positive in the ED along with elevated D-dimer and in Afibb with RVR and labs concerning for AKI.  Workup with MRI Brain without contrast was negative for an acute stroke but only limited sequences were obtained and I do not see any SWI sequences. rEEG was negative for mild diffuse encephalopathy, nonspecific etiology. No seizures or definite epileptiform discharges. His warfarin was held for MRI but was never resumed even thou MRI Brain was negative for an acute stroke.   Interval Hx    He is awake, alert and back to himself. Has very poor recollection of what happened. Reports that he just woke up in the hospital. He still has a holocephalic headache.  No prior hx of seizures, does not use alcohol, no hx of CNS infection or surgery, no prior ICH.  Vitals   Vitals:    07/16/20 1745 07/16/20 1800 07/16/20 1908 07/16/20 2055  BP: 113/73 115/71 108/70 119/75  Pulse: 99 90 94 99  Resp:   (!) 24 20  Temp:    98.2 F (36.8 C)  TempSrc:    Oral  SpO2: 99% 97% 98% 93%  Weight:      Height:         Body mass index is 28.19 kg/m.  Physical Exam   General: Laying comfortably in bed; in no acute distress. HENT: Normal oropharynx and mucosa. Normal external appearance of ears and nose. Neck: Supple, no pain or tenderness CV: No JVD. No peripheral edema. Pulmonary: Symmetric Chest rise. Normal respiratory effort. Abdomen: Soft to touch, non-tender. Ext: No cyanosis, edema, or deformity  Skin: No rash. Normal palpation of skin.  Musculoskeletal: Normal digits and nails by inspection. No clubbing.  Neurologic Examination  Mental status/Cognition: Alert, oriented to self, place, month and year, good attention. Speech/language: Fluent, comprehension intact, object naming intact, repetition intact.  Cranial nerves:   CN II Pupils equal and reactive to light, no VF deficits    CN III,IV,VI EOM intact, no gaze preference or deviation, no nystagmus    CN V normal sensation in V1, V2, and V3 segments bilaterally    CN VII no asymmetry, no nasolabial fold flattening    CN VIII normal hearing to speech    CN IX & X normal palatal elevation, no uvular deviation    CN XI 5/5 head turn and 5/5 shoulder shrug bilaterally    CN XII  midline tongue protrusion    Motor:  Muscle bulk: normal, tone normal, pronator drift none tremor none Mvmt Root Nerve  Muscle Right Left Comments  SA C5/6 Ax Deltoid 5 5   EF C5/6 Mc Biceps 5 5   EE C6/7/8 Rad Triceps 5 5   WF C6/7 Med FCR     WE C7/8 PIN ECU     F Ab C8/T1 U ADM/FDI 5 5   HF L1/2/3 Fem Illopsoas 5 5   KE L2/3/4 Fem Quad 5 5   DF L4/5 D Peron Tib Ant 5 5   PF S1/2 Tibial Grc/Sol 5 5    Reflexes:  Right Left Comments  Pectoralis      Biceps (C5/6) 2 2   Brachioradialis (C5/6) 2 2    Triceps (C6/7) 2 2     Patellar (L3/4) 2 2    Achilles (S1)      Hoffman      Plantar     Jaw jerk    Sensation:  Light touch intact   Pin prick    Temperature    Vibration   Proprioception    Coordination/Complex Motor:  - Finger to Nose intact BL - Heel to shin intact BL - Rapid alternating movement are normal - Gait: Deferred.  Labs   Basic Metabolic Panel:  Lab Results  Component Value Date   NA 137 07/15/2020   K 3.4 (L) 07/15/2020   CO2 26 07/15/2020   GLUCOSE 107 (H) 07/15/2020   BUN 18 07/15/2020   CREATININE 1.70 (H) 07/15/2020   CALCIUM 9.0 07/15/2020   GFRNONAA 42 (L) 07/15/2020   GFRAA >60 03/30/2018   HbA1c: No results found for: HGBA1C LDL:  Lab Results  Component Value Date   LDLCALC 36 07/16/2020   Urine Drug Screen:     Component Value Date/Time   LABOPIA NONE DETECTED 07/15/2020 1939   COCAINSCRNUR NONE DETECTED 07/15/2020 1939   LABBENZ NONE DETECTED 07/15/2020 1939   AMPHETMU NONE DETECTED 07/15/2020 1939   THCU NONE DETECTED 07/15/2020 1939   LABBARB NONE DETECTED 07/15/2020 1939    Alcohol Level     Component Value Date/Time   ETH <10 07/15/2020 1827   No results found for: PHENYTOIN, ZONISAMIDE, LAMOTRIGINE, LEVETIRACETA No results found for: PHENYTOIN, PHENOBARB, VALPROATE, CBMZ  Imaging and Diagnostic studies   MRI Brain w/o contrast(personally reviewed): Prematurely terminated examination, as described. No evidence of acute intracranial abnormality. Chronic lacunar infarcts within the bilateral corona radiata, basal ganglia, thalami and within the pons. Background chronic small vessel ischemic changes which are moderate in the cerebral white matter, and mild in the pons. Small chronic infarcts within the bilateral cerebellar hemispheres. Central predominant chronic microhemorrhages, likely reflecting sequela of hypertensive microangiopathy. Mild-to-moderate generalized cerebral atrophy with comparatively mild cerebellar atrophy.  CTA neck:    1. The common carotid and internal carotid arteries are patent within the neck without hemodynamically significant stenosis. Atherosclerotic disease within both carotid systems, as described. 2. Vertebral arteries patent within the neck. Moderate/severe atherosclerotic stenosis at the origin of the non-dominant left vertebral artery.   CTA head:   Intracranial atherosclerotic disease, as described. No intracranial large vessel occlusion or proximal high-grade arterial stenosis is identified.   CT perfusion head:   The perfusion software identifies patchy foci of hypoperfusion within the right cerebral hemisphere (MCA territory and MCA/PCA watershed territory) totaling 13 mL (utilizing the Tmax>6 seconds threshold). Reported mismatch 13 mL.  Impression   Vincent Carter is a 75  y.o. 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 Curly Shores for confusion and intermittent left sided neglect and left facial droop. His symptoms have now resolved and he is back to baseline.  MRI Brain with no acute stroke, CT Perfusion with ~1cc mismatch in the [facility] and RMCA/PCA watershed distribution an CT angio with no LVO, no significant stenosis. rEEG with no epileptogenic abnormality.  His presentation is very atypical. The left sided symptoms with the noted perfusion deficit makes me think that this might have been a TIA. Specially with his known Afibb with RVR on presentation and subtherapeutic INR. Very odd for him to have no recollection of the event if this  was a TIA. There is no documentation of any seizure activity, he has no prior hsitory of seizures and certainly does not endorse any seizure risk factors. Perhaps he was just confused 2/2 COVID and therefore does not remember much about the event.  Another possibility is potential sinus venous thrombosis. Covid is thrombogenic, he has elevated D-dimer and is reporting headache in addition to his symptoms. Althoh his headche is improved and symptoms have resolved.  I feel inclined towards treating this as a TIA given risk factors but I will get MR Venogram head to rule out CVST.  Recommendations  - MR Venogram head - LDL is < 70 - HbA1c is pending - TTE with EF of 45-50%, no atrial level shunt on color flow doppler. - discussed with Dr. Hal Hope, he is okay with resuming home Anticoagulation. - Stroke team to follow. ______________________________________________________________________   Thank you for the opportunity to take part in the care of this patient. If you have any further questions, please contact the neurology consultation attending.  Signed,  [place] Pager Number [phone]

## 2020-07-16 NOTE — Progress Notes (Signed)
2030- patient arrived into 5W35 from ED. Ambulated from stretcher to bed.   VSS. Denies pain. No signs of acute cardiac or respiratory distress. Non skid foot wear on, bed locked and in lowest position, call light and personal belongings within reach

## 2020-07-16 NOTE — ED Notes (Signed)
EEG at bedside.

## 2020-07-16 NOTE — ED Notes (Signed)
Patient laert & oriented. Patient resting in bed and voices no complaints at this time.

## 2020-07-16 NOTE — Procedures (Signed)
Patient Name: Vincent Carter  MRN: [id number]  Epilepsy Attending: Lora Havens  Referring Physician/Provider: Dr Lesleigh Noe Date: 07/16/2020 Duration: 24.01 mins  Patient history: 75 year old gentleman with significant cardiac history as above presenting with intermittent transient neurological symptoms. EEG to evaluate for seizure  Level of alertness: Awake, asleep  AEDs during EEG study: None  Technical aspects: This EEG study was done with scalp electrodes positioned according to the 10-20 International system of electrode placement. Electrical activity was acquired at a sampling rate of 500Hz  and reviewed with a high frequency filter of 70Hz  and a low frequency filter of 1Hz . EEG data were recorded continuously and digitally stored.   Description: The posterior dominant rhythm consists of 8 Hz activity of moderate voltage (25-35 uV) seen predominantly in posterior head regions, symmetric and reactive to eye opening and eye closing. Sleep was characterized by vertex waves, sleep spindles (12 to 14 Hz), maximal frontocentral region. EEG showed intermittent generalized 3 to 6 Hz theta-delta slowing. Physiologic photic driving was not seen during photic stimulation.  Hyperventilation was not performed.     ABNORMALITY - Intermittent slow, generalized  IMPRESSION: This study is suggestive of mild diffuse encephalopathy, nonspecific etiology. No seizures or definite epileptiform discharges were seen throughout the recording.   [name]

## 2020-07-16 NOTE — ED Notes (Signed)
Patient transported to MRI 

## 2020-07-16 NOTE — Progress Notes (Signed)
EEG complete - results pending 

## 2020-07-17 ENCOUNTER — Other Ambulatory Visit (HOSPITAL_COMMUNITY): Payer: Self-pay

## 2020-07-17 ENCOUNTER — Inpatient Hospital Stay (HOSPITAL_COMMUNITY): Payer: Medicare Other

## 2020-07-17 DIAGNOSIS — R791 Abnormal coagulation profile: Secondary | ICD-10-CM

## 2020-07-17 DIAGNOSIS — I5022 Chronic systolic (congestive) heart failure: Secondary | ICD-10-CM

## 2020-07-17 DIAGNOSIS — E876 Hypokalemia: Secondary | ICD-10-CM

## 2020-07-17 DIAGNOSIS — D631 Anemia in chronic kidney disease: Secondary | ICD-10-CM

## 2020-07-17 DIAGNOSIS — R42 Dizziness and giddiness: Secondary | ICD-10-CM

## 2020-07-17 DIAGNOSIS — D696 Thrombocytopenia, unspecified: Secondary | ICD-10-CM

## 2020-07-17 DIAGNOSIS — N1831 Chronic kidney disease, stage 3a: Secondary | ICD-10-CM

## 2020-07-17 DIAGNOSIS — D751 Secondary polycythemia: Secondary | ICD-10-CM

## 2020-07-17 LAB — CBC WITH DIFFERENTIAL/PLATELET
Abs Immature Granulocytes: 0.07 10*3/uL (ref 0.00–0.07)
Basophils Absolute: 0.1 10*3/uL (ref 0.0–0.1)
Basophils Relative: 1 %
Eosinophils Absolute: 0.1 10*3/uL (ref 0.0–0.5)
Eosinophils Relative: 2 %
HCT: 51.9 % (ref 39.0–52.0)
Hemoglobin: 17.1 g/dL — ABNORMAL HIGH (ref 13.0–17.0)
Immature Granulocytes: 1 %
Lymphocytes Relative: 28 %
Lymphs Abs: 1.5 10*3/uL (ref 0.7–4.0)
MCH: 29.2 pg (ref 26.0–34.0)
MCHC: 32.9 g/dL (ref 30.0–36.0)
MCV: 88.7 fL (ref 80.0–100.0)
Monocytes Absolute: 1 10*3/uL (ref 0.1–1.0)
Monocytes Relative: 18 %
Neutro Abs: 2.8 10*3/uL (ref 1.7–7.7)
Neutrophils Relative %: 50 %
Platelets: 95 10*3/uL — ABNORMAL LOW (ref 150–400)
RBC: 5.85 MIL/uL — ABNORMAL HIGH (ref 4.22–5.81)
RDW: 15.3 % (ref 11.5–15.5)
WBC: 5.5 10*3/uL (ref 4.0–10.5)
nRBC: 0 % (ref 0.0–0.2)

## 2020-07-17 LAB — PROTIME-INR
INR: 1.6 — ABNORMAL HIGH (ref 0.8–1.2)
Prothrombin Time: 18.7 seconds — ABNORMAL HIGH (ref 11.4–15.2)

## 2020-07-17 LAB — COMPREHENSIVE METABOLIC PANEL
ALT: 13 U/L (ref 0–44)
AST: 22 U/L (ref 15–41)
Albumin: 3.2 g/dL — ABNORMAL LOW (ref 3.5–5.0)
Alkaline Phosphatase: 43 U/L (ref 38–126)
Anion gap: 7 (ref 5–15)
BUN: 26 mg/dL — ABNORMAL HIGH (ref 8–23)
CO2: 23 mmol/L (ref 22–32)
Calcium: 8.3 mg/dL — ABNORMAL LOW (ref 8.9–10.3)
Chloride: 105 mmol/L (ref 98–111)
Creatinine, Ser: 1.4 mg/dL — ABNORMAL HIGH (ref 0.61–1.24)
GFR, Estimated: 52 mL/min — ABNORMAL LOW (ref >60)
Glucose, Bld: 91 mg/dL (ref 70–99)
Potassium: 3.4 mmol/L — ABNORMAL LOW (ref 3.5–5.1)
Sodium: 135 mmol/L (ref 135–145)
Total Bilirubin: 1.2 mg/dL (ref 0.3–1.2)
Total Protein: 5.6 g/dL — ABNORMAL LOW (ref 6.5–8.1)

## 2020-07-17 LAB — HIGH SENSITIVITY CRP: CRP, High Sensitivity: 56.1 mg/L — ABNORMAL HIGH (ref 0.00–3.00)

## 2020-07-17 LAB — D-DIMER, QUANTITATIVE: D-Dimer, Quant: 0.92 ug/mL-FEU — ABNORMAL HIGH (ref 0.00–0.50)

## 2020-07-17 LAB — FERRITIN: Ferritin: 269 ng/mL (ref 24–336)

## 2020-07-17 LAB — MAGNESIUM: Magnesium: 2.2 mg/dL (ref 1.7–2.4)

## 2020-07-17 LAB — C-REACTIVE PROTEIN: CRP: 9.4 mg/dL — ABNORMAL HIGH (ref <1.0)

## 2020-07-17 LAB — HEMOGLOBIN A1C
Hgb A1c MFr Bld: 5.4 % (ref 4.8–5.6)
Mean Plasma Glucose: 108 mg/dL

## 2020-07-17 MED ORDER — WARFARIN SODIUM 5 MG PO TABS
5.0000 mg | ORAL_TABLET | Freq: Once | ORAL | Status: AC
  Administered 2020-07-17: 5 mg via ORAL
  Filled 2020-07-17: qty 1

## 2020-07-17 MED ORDER — GADOBUTROL 1 MMOL/ML IV SOLN
7.5000 mL | Freq: Once | INTRAVENOUS | Status: AC | PRN
  Administered 2020-07-17: 7.5 mL via INTRAVENOUS

## 2020-07-17 MED ORDER — POTASSIUM CHLORIDE CRYS ER 20 MEQ PO TBCR
40.0000 meq | EXTENDED_RELEASE_TABLET | ORAL | Status: AC
  Administered 2020-07-17 (×2): 40 meq via ORAL
  Filled 2020-07-17 (×2): qty 2

## 2020-07-17 NOTE — Evaluation (Signed)
Physical Therapy Evaluation Patient Details Name: Vincent Carter MRN: [id number] DOB: 05/02/1945 Today's Date: 07/17/2020   History of Present Illness  75yo male admitted 07/15/20 with family reports of AMS, L facial droop, increased weakness, and vertigo. Covid positive. CTH, MRI, EEG, MR venogram all negative for acute changes. PMH CVD, CHF, COPD, HOH, HTN, PVD, CABG, tib-fib ORIF, THA  Clinical Impression   Patient received in bed, very pleasant and cooperative but HOH. Able to follow all cues and commands well today. Seems very close to if not at his functional baseline in terms of mobility. SpO2 no lower than 95% with activity on room air, HR was in Afib pattern to 121BPM at most. Left up in recliner with all needs met, nursing staff aware of patient status. Will continue to follow, do not anticipate skilled PT or DME needs at DC.     Follow Up Recommendations No PT follow up    Equipment Recommendations  None recommended by PT    Recommendations for Other Services       Precautions / Restrictions Precautions Precautions: Other (comment) Precaution Comments: Covid +, poor vision at baseline Restrictions Weight Bearing Restrictions: No      Mobility  Bed Mobility Overal bed mobility: Modified Independent             General bed mobility comments: HOB elevated    Transfers Overall transfer level: Needs assistance   Transfers: Sit to/from Stand Sit to Stand: Supervision         General transfer comment: S for safety, did have mild posterior LOB but able to correct without assist from PT  Ambulation/Gait Ambulation/Gait assistance: Supervision Gait Distance (Feet): 100 Feet Assistive device: None Gait Pattern/deviations: Step-through pattern;Trunk flexed Gait velocity: decreased   General Gait Details: gait pattern generally WNL, no significant deviations or unsteadiness noted  Stairs            Wheelchair Mobility    Modified Rankin (Stroke  Patients Only)       Balance Overall balance assessment: Mild deficits observed, not formally tested                                           Pertinent Vitals/Pain Pain Assessment: No/denies pain    Home Living Family/patient expects to be discharged to:: Private residence Living Arrangements: Alone Available Help at Discharge: Family;Available PRN/intermittently (multiple family members live nearby) Type of Home: Mobile home Home Access: Stairs to enter Entrance Stairs-Rails: Psychiatric nurse of Steps: 5 in the front B rails, 6 in the back B rails Home Layout: One level Home Equipment: None Additional Comments: no falls or close calls, able to cook/drive/dress/bathe no issues but tells me he usually just eats out    Prior Function Level of Independence: Independent               Hand Dominance        Extremity/Trunk Assessment   Upper Extremity Assessment Upper Extremity Assessment: Defer to OT evaluation    Lower Extremity Assessment Lower Extremity Assessment: Overall WFL for tasks assessed    Cervical / Trunk Assessment Cervical / Trunk Assessment: Normal  Communication   Communication: No difficulties  Cognition Arousal/Alertness: Awake/alert Behavior During Therapy: WFL for tasks assessed/performed Overall Cognitive Status: Within Functional Limits for tasks assessed  General Comments: followed commands well, A&Ox3 as he could not remember why he was here in the hospital      General Comments      Exercises     Assessment/Plan    PT Assessment Patient needs continued PT services  PT Problem List Decreased cognition;Decreased activity tolerance;Decreased balance;Decreased mobility       PT Treatment Interventions DME instruction;Balance training;Gait training;Stair training;Cognitive remediation;Functional mobility training;Patient/family education;Therapeutic  activities;Therapeutic exercise    PT Goals (Current goals can be found in the Care Plan section)  Acute Rehab PT Goals Patient Stated Goal: go home when medically ready PT Goal Formulation: With patient Time For Goal Achievement: 07/31/20 Potential to Achieve Goals: Good    Frequency Min 3X/week   Barriers to discharge        Co-evaluation               AM-PAC PT "6 Clicks" Mobility  Outcome Measure Help needed turning from your back to your side while in a flat bed without using bedrails?: None Help needed moving from lying on your back to sitting on the side of a flat bed without using bedrails?: None Help needed moving to and from a bed to a chair (including a wheelchair)?: None Help needed standing up from a chair using your arms (e.g., wheelchair or bedside chair)?: None Help needed to walk in hospital room?: None Help needed climbing 3-5 steps with a railing? : A Little 6 Click Score: 23    End of Session   Activity Tolerance: Patient tolerated treatment well Patient left: in chair;with call bell/phone within reach Nurse Communication: Mobility status;Other (comment) (O2 on RA) PT Visit Diagnosis: Unsteadiness on feet (R26.81)    Time: 8299-3716 PT Time Calculation (min) (ACUTE ONLY): 25 min   Charges:   PT Evaluation $PT Eval Moderate Complexity: 1 Mod PT Treatments $Gait Training: 8-22 mins       Windell Norfolk, DPT, PN1   Supplemental Physical Therapist [place]    Pager [phone] Acute Rehab Office [phone]

## 2020-07-17 NOTE — TOC Benefit Eligibility Note (Signed)
Patient Teacher, English as a foreign language completed.    The patient is currently admitted and upon discharge could be taking Xarelto 15 mg.  The current 30 day co-pay is, $142.00 due to a $95.00 deductible remaining.  Will be $47.00 copay after this time.   The patient is insured through [place] Patient Advocate Specialist [place] Team Direct Number: [phone]  Fax: [phone]

## 2020-07-17 NOTE — Evaluation (Signed)
Occupational Therapy Evaluation Patient Details Name: Vincent Carter MRN: [id number] DOB: February 20, 1945 Today's Date: 07/17/2020    History of Present Illness 75yo male admitted 07/15/20 with family reports of AMS, L facial droop, increased weakness, and vertigo. Covid positive. CTH, MRI, EEG, MR venogram all negative for acute changes. PMH CVD, CHF, COPD, HOH, HTN, PVD, CABG, tib-fib ORIF, THA   Clinical Impression   Pt is functioning at a supervision level, primarily for multiple lines. VSS on RA with Sp02 95-97% during ambulation in room. Pt endorses baseline memory deficits, uses compensatory strategies. No OT needs.     Follow Up Recommendations  No OT follow up    Equipment Recommendations  None recommended by OT    Recommendations for Other Services       Precautions / Restrictions Precautions Precautions: None      Mobility Bed Mobility Overal bed mobility: Modified Independent                  Transfers Overall transfer level: Needs assistance Equipment used: None Transfers: Sit to/from Stand Sit to Stand: Supervision         General transfer comment: supervision for lines    Balance Overall balance assessment: No apparent balance deficits (not formally assessed)                                         ADL either performed or assessed with clinical judgement   ADL                                         General ADL Comments: supervised for lines     Vision Patient Visual Report: No change from baseline;Blurring of vision       Perception     Praxis      Pertinent Vitals/Pain Pain Assessment: No/denies pain     Hand Dominance Left   Extremity/Trunk Assessment Upper Extremity Assessment Upper Extremity Assessment: Overall WFL for tasks assessed   Lower Extremity Assessment Lower Extremity Assessment: Defer to PT evaluation   Cervical / Trunk Assessment Cervical / Trunk Assessment: Normal    Communication Communication Communication: HOH   Cognition Arousal/Alertness: Awake/alert Behavior During Therapy: WFL for tasks assessed/performed Overall Cognitive Status: Within Functional Limits for tasks assessed                                     General Comments       Exercises     Shoulder Instructions      Home Living Family/patient expects to be discharged to:: Private residence Living Arrangements: Alone Available Help at Discharge: Family;Friend(s);Available PRN/intermittently Type of Home: House Home Access: Stairs to enter [employer] of Steps: 5 in the front B rails, 6 in the back B rails Entrance Stairs-Rails: Right;Left Home Layout: One level     Bathroom Shower/Tub: Tub/shower unit;Walk-in Psychologist, prison and probation services: Standard     Home Equipment: None      Lives With: Alone    Prior Functioning/Environment Level of Independence: Independent                 OT Problem List:        OT Treatment/Interventions:  OT Goals(Current goals can be found in the care plan section) Acute Rehab OT Goals Patient Stated Goal: go home when medically ready  OT Frequency:     Barriers to D/C:            Co-evaluation              AM-PAC OT "6 Clicks" Daily Activity     Outcome Measure Help from another person eating meals?: None Help from another person taking care of personal grooming?: None Help from another person toileting, which includes using toliet, bedpan, or urinal?: None Help from another person bathing (including washing, rinsing, drying)?: None Help from another person to put on and taking off regular upper body clothing?: None Help from another person to put on and taking off regular lower body clothing?: None 6 Click Score: 24   End of Session    Activity Tolerance: Patient tolerated treatment well Patient left: in bed;with call bell/phone within reach  OT Visit Diagnosis: Muscle weakness  (generalized) (M62.81)                Time: 1610-9604 OT Time Calculation (min): 21 min Charges:  OT General Charges $OT Visit: 1 Visit OT Evaluation $OT Eval Low Complexity: 1 Low  Nestor Lewandowsky, OTR/L Acute Rehabilitation Services Pager: [phone] Office: [phone]  [name] 07/17/2020, 3:12 PM

## 2020-07-17 NOTE — Progress Notes (Addendum)
PROGRESS NOTE  ARVIL UTZ LGX:[id number] DOB: 1945-06-03   PCP: [name], MD  Patient is from: Home.  Lives alone independently.  DOA: 07/15/2020 LOS: 2  Chief complaints:  Chief Complaint  Patient presents with   Code Stroke     Brief Narrative / Interim history: 75 year old M with PMH of CAD/CABG, A. fib/flutter on warfarin, COPD, systolic CHF and PAD presented to [facility] ED with altered mental status and left facial droop.  There was concern about right cerebral watershed infarct on CT perfusion study.  He was transferred to [facility] for further evaluation with MRI brain, and neurology evaluation.  MRI brain without acute finding.  TTE with LVEF of 45 to 50%.  EEG negative for seizure activity.  Evaluated by neurology.  He had MR venogram which is also negative.  Patient tested positive for COVID-19 and was started on remdesivir.  Per patient, he is fully vaccinated including booster.  Subjective: Seen and examined earlier this morning.  No major events overnight of this morning.  No complaints other than cough.  He denies shortness of breath, chest pain, headache, acute vision change, focal numbness, tingling or weakness, dizziness, GI or UTI symptoms.  He reports intermittent dizziness that he describes as lightheadedness that could happen even while seated.  Per RN, he felt short of breath with minimal exertion.   Objective: Vitals:   07/17/20 0031 07/17/20 0400 07/17/20 0828 07/17/20 1236  BP: 118/68 122/73 (!) 111/100 108/66  Pulse: 85 83 90 85  Resp: 18 18 (!) 25 (!) 25  Temp: 98.2 F (36.8 C) 98.9 F (37.2 C) 98.9 F (37.2 C) 99.2 F (37.3 C)  TempSrc: Oral Oral Oral Oral  SpO2: 99% 97% 97% 94%  Weight:      Height:        Intake/Output Summary (Last 24 hours) at 07/17/2020 1358 Last data filed at 07/17/2020 0827 Gross per 24 hour  Intake 220 ml  Output 750 ml  Net -530 ml   Filed Weights   07/15/20 1749 07/16/20 2055  Weight: 69.9 kg 69.8  kg    Examination:  GENERAL: No apparent distress.  Nontoxic. HEENT: MMM.  Vision and hearing grossly intact.  NECK: Supple.  No apparent JVD.  RESP: On RA.  No IWOB.  Fair aeration bilaterally. CVS:  RRR. Heart sounds normal.  ABD/GI/GU: BS+. Abd soft, NTND.  MSK/EXT:  Moves extremities. No apparent deformity. No edema.  SKIN: no apparent skin lesion or wound NEURO: Awake, alert and oriented appropriately. Speech clear. Cranial nerves II-XII grossly intact. Motor 5/5 in all muscle groups of UE and LE bilaterally, Normal tone. Light sensation intact in all dermatomes of upper and lower ext bilaterally. Patellar reflex symmetric.  No pronator drift.  Finger to nose intact. PSYCH: Calm. Normal affect.   Procedures:  None  Microbiology summarized: COVID-19 PCR positive.  Assessment & Plan: Transient ischemic attack versus CVA-presented with transient mental status change and left facial droop.  CT perfusion study raise concern for right cerebral watershed infarct but MRI brain negative for acute finding.  MR venogram without acute finding.  TTE with LVEF of 45 to 50%.  EEG negative for seizure.  A1c 5.4%.  LDL 36.  UDS and ethanol level negative.  COVID-19 PCR positive.  He also has polycythemia.  -Neurology following -Continue anticoagulation for A. fib/flutter -On remdesivir for COVID-19 infection -Continue gentle hydration.   COVID infection: Could be contributing to his symptoms.  Has cough but no other  significant respiratory symptoms.  Vaccinated including booster -Continue remdesivir -Encourage incentive spirometry/OOB/PT/OT   Paroxysmal atrial fibrillation: On Coumadin.  Not a candidate for DOAC? -Continue warfarin for now-INR subtherapeutic. -On metoprolol instead of home Coreg given soft blood pressures.  Chronic systolic CHF-TTE with LVEF of 45 to 50% (improved), indeterminate DD, moderate RAE.  No cardiopulmonary symptoms.  Appears euvolemic.  Not on diuretics at home  other than HCTZ for BP. -Monitor while on IV fluid.  Polycythemia: secondary from dehydration and COPD?  Improved. Recent Labs    07/15/20 1750 07/15/20 1753 07/17/20 0041  HGB 18.3* 19.4* 17.1*  -Continue gentle hydration. -Recheck in the morning   Episodic dizziness: Reports intermittent dizziness.  Not necessarily positional.  Denies vertigo. -Check orthostatic vitals  CKD-3A: H&H stable. Recent Labs    07/15/20 1750 07/15/20 1753 07/17/20 0041  BUN 18 18 26*  CREATININE 1.67* 1.70* 1.40*  -Continue monitoring  Essential hypertension: Normotensive off home Coreg, lisinopril and HCTZ. -Continue holding home antihypertensive meds   History of CAD s/p remote CABG/Stents: Stable. -Continue home medications   History of COPD: Stable. -Continue home breathing treatments as needed  Hypokalemia: K3.4. -Replenish and recheck  Thrombocytopenia: Likely due to COVID-19 infection Recent Labs  Lab 07/15/20 1750 07/17/20 0041  PLT 142* 95*     Body mass index is 28.15 kg/m.         DVT prophylaxis:  Place and maintain sequential compression device Start: 07/15/20 2058 warfarin (COUMADIN) tablet 5 mg  Code Status: Full code except for intubation and mechanical ventilation Family Communication: Patient and/or RN. Available if any question.  Level of care: Telemetry Medical Status is: Inpatient  Remains inpatient appropriate because:Ongoing diagnostic testing needed not appropriate for outpatient work up, Unsafe d/c plan, IV treatments appropriate due to intensity of illness or inability to take PO, and Inpatient level of care appropriate due to severity of illness  Dispo: The patient is from: Home              Anticipated d/c is to: Home              Patient currently is not medically stable to d/c.   Difficult to place patient No       Consultants:  Neurology   Sch Meds:  Scheduled Meds:   stroke: mapping our early stages of recovery book   Does not  apply Once   cholecalciferol  1,000 Units Oral Daily   fluticasone  1 spray Each Nare BID   metoprolol tartrate  25 mg Oral BID   montelukast  10 mg Oral QHS   omega-3 acid ethyl esters  1,000 mg Oral BID   senna-docusate  1 tablet Oral QHS   warfarin  5 mg Oral ONCE-1600   Warfarin - Pharmacist Dosing Inpatient   Does not apply q1600   Continuous Infusions:  sodium chloride 50 mL/hr at 07/17/20 1002   PRN Meds:.acetaminophen **OR** acetaminophen (TYLENOL) oral liquid 160 mg/5 mL **OR** acetaminophen, albuterol, dextromethorphan-guaiFENesin, ipratropium-albuterol, LORazepam, metoprolol tartrate, nitroGLYCERIN  Antimicrobials: Anti-infectives (From admission, onward)    Start     Dose/Rate Route Frequency Ordered Stop   07/16/20 1000  remdesivir 100 mg in sodium chloride 0.9 % 100 mL IVPB        100 mg 200 mL/hr over 30 Minutes Intravenous Daily 07/15/20 2217 07/17/20 1033   07/15/20 2230  remdesivir 100 mg in sodium chloride 0.9 % 100 mL IVPB        100 mg 200  mL/hr over 30 Minutes Intravenous Every 30 min 07/15/20 2217 07/15/20 2332        I have personally reviewed the following labs and images: CBC: Recent Labs  Lab 07/15/20 1750 07/15/20 1753 07/17/20 0041  WBC 9.1  --  5.5  NEUTROABS 6.9  --  2.8  HGB 18.3* 19.4* 17.1*  HCT 54.2* 57.0* 51.9  MCV 89.7  --  88.7  PLT 142*  --  95*   BMP &GFR Recent Labs  Lab 07/15/20 1750 07/15/20 1753 07/17/20 0041  NA 133* 137 135  K 3.3* 3.4* 3.4*  CL 98 97* 105  CO2 26  --  23  GLUCOSE 107* 107* 91  BUN 18 18 26*  CREATININE 1.67* 1.70* 1.40*  CALCIUM 9.0  --  8.3*  MG  --   --  2.2   Estimated Creatinine Clearance: 38.5 mL/min (A) (by C-G formula based on SCr of 1.4 mg/dL (H)). Liver & Pancreas: Recent Labs  Lab 07/15/20 1750 07/17/20 0041  AST 14* 22  ALT 14 13  ALKPHOS 56 43  BILITOT 3.0* 1.2  PROT 6.7 5.6*  ALBUMIN 4.0 3.2*   No results for input(s): LIPASE, AMYLASE in the last 168 hours. No results  for input(s): AMMONIA in the last 168 hours. Diabetic: Recent Labs    07/16/20 0330  HGBA1C 5.4   No results for input(s): GLUCAP in the last 168 hours. Cardiac Enzymes: No results for input(s): CKTOTAL, CKMB, CKMBINDEX, TROPONINI in the last 168 hours. No results for input(s): PROBNP in the last 8760 hours. Coagulation Profile: Recent Labs  Lab 07/15/20 1750 07/17/20 0041  INR 1.7* 1.6*   Thyroid Function Tests: No results for input(s): TSH, T4TOTAL, FREET4, T3FREE, THYROIDAB in the last 72 hours. Lipid Profile: Recent Labs    07/16/20 0330  CHOL 86  HDL 31*  LDLCALC 36  TRIG 93  CHOLHDL 2.8   Anemia Panel: Recent Labs    07/15/20 2234 07/17/20 0041  FERRITIN 264 269   Urine analysis:    Component Value Date/Time   COLORURINE YELLOW 07/15/2020 1939   APPEARANCEUR CLEAR 07/15/2020 1939   LABSPEC 1.045 (H) 07/15/2020 1939   PHURINE 6.0 07/15/2020 1939   GLUCOSEU NEGATIVE 07/15/2020 1939   HGBUR SMALL (A) 07/15/2020 1939   BILIRUBINUR NEGATIVE 07/15/2020 1939   KETONESUR NEGATIVE 07/15/2020 1939   PROTEINUR NEGATIVE 07/15/2020 1939   NITRITE NEGATIVE 07/15/2020 1939   LEUKOCYTESUR NEGATIVE 07/15/2020 1939   Sepsis Labs: Invalid input(s): PROCALCITONIN, [place]  Microbiology: Recent Results (from the past 240 hour(s))  Resp Panel by RT-PCR (Flu A&B, Covid) Nasopharyngeal Swab     Status: Abnormal   Collection Time: 07/15/20  6:12 PM   Specimen: Nasopharyngeal Swab; Nasopharyngeal(NP) swabs in vial transport medium  Result Value Ref Range Status   SARS Coronavirus 2 by RT PCR POSITIVE (A) NEGATIVE Final    Comment: RESULT CALLED TO, READ BACK BY AND VERIFIED WITH: GESELL,C @ 1951 ON 07/15/20 BY JUW (NOTE) SARS-CoV-2 target nucleic acids are DETECTED.  The SARS-CoV-2 RNA is generally detectable in upper respiratory specimens during the acute phase of infection. Positive results are indicative of the presence of the identified virus, but do not  rule out bacterial infection or co-infection with other pathogens not detected by the test. Clinical correlation with patient history and other diagnostic information is necessary to determine patient infection status. The expected result is Negative.  Fact Sheet for Patients: EntrepreneurPulse.com.au  Fact Sheet for Healthcare Providers: IncredibleEmployment.be  This  test is not yet approved or cleared by the [place] and  has been authorized for detection and/or diagnosis of SARS-CoV-2 by FDA under an Emergency Use Authorization (EUA).  This EUA will remain in effect (meaning this test can be  used) for the duration of  the COVID-19 declaration under Section 564(b)(1) of the Act, 21 U.S.C. section 360bbb-3(b)(1), unless the authorization is terminated or revoked sooner.     Influenza A by PCR NEGATIVE NEGATIVE Final   Influenza B by PCR NEGATIVE NEGATIVE Final    Comment: (NOTE) The Xpert Xpress SARS-CoV-2/FLU/RSV plus assay is intended as an aid in the diagnosis of influenza from Nasopharyngeal swab specimens and should not be used as a sole basis for treatment. Nasal washings and aspirates are unacceptable for Xpert Xpress SARS-CoV-2/FLU/RSV testing.  Fact Sheet for Patients: EntrepreneurPulse.com.au  Fact Sheet for Healthcare Providers: IncredibleEmployment.be  This test is not yet approved or cleared by the [place] FDA and has been authorized for detection and/or diagnosis of SARS-CoV-2 by FDA under an Emergency Use Authorization (EUA). This EUA will remain in effect (meaning this test can be used) for the duration of the COVID-19 declaration under Section 564(b)(1) of the Act, 21 U.S.C. section 360bbb-3(b)(1), unless the authorization is terminated or revoked.  Performed at [facility], [address], [place], [place] [address]     Radiology Studies: MR Venogram Head  Result  Date: 07/17/2020 CLINICAL DATA:  Headache.  Evaluate for DVST. EXAM: MR VENOGRAM HEAD WITHOUT AND WITH CONTRAST TECHNIQUE: Angiographic images of the intracranial venous structures were acquired using MRV technique without and with intravenous contrast. CONTRAST:  7.80mL GADAVIST GADOBUTROL 1 MMOL/ML IV SOLN COMPARISON:  MRI yesterday. FINDINGS: No dural venous sinus thrombosis or stenosis. Deep veins are symmetrically flowing. Postcontrast images of the brain show no worrisome enhancement. IMPRESSION: Negative MRV. Electronically Signed   By: Monte Fantasia M.D.   On: 07/17/2020 08:23   EEG adult  Result Date: 07/16/2020 Lora Havens, MD     07/16/2020  5:53 PM Patient Name: CONNIE HILGERT MRN: [id number] Epilepsy Attending: Lora Havens Referring Physician/Provider: Dr Lesleigh Noe Date: 07/16/2020 Duration: 24.01 mins Patient history: 75 year old gentleman with significant cardiac history as above presenting with intermittent transient neurological symptoms. EEG to evaluate for seizure Level of alertness: Awake, asleep AEDs during EEG study: None Technical aspects: This EEG study was done with scalp electrodes positioned according to the 10-20 International system of electrode placement. Electrical activity was acquired at a sampling rate of 500Hz  and reviewed with a high frequency filter of 70Hz  and a low frequency filter of 1Hz . EEG data were recorded continuously and digitally stored. Description: The posterior dominant rhythm consists of 8 Hz activity of moderate voltage (25-35 uV) seen predominantly in posterior head regions, symmetric and reactive to eye opening and eye closing. Sleep was characterized by vertex waves, sleep spindles (12 to 14 Hz), maximal frontocentral region. EEG showed intermittent generalized 3 to 6 Hz theta-delta slowing. Physiologic photic driving was not seen during photic stimulation.  Hyperventilation was not performed.   ABNORMALITY - Intermittent slow, generalized  IMPRESSION: This study is suggestive of mild diffuse encephalopathy, nonspecific etiology. No seizures or definite epileptiform discharges were seen throughout the recording. Priyanka Barbra Sarks       Shawnika Pepin T. [place]  If 7PM-7AM, please contact night-coverage [email or web address] 07/17/2020, 1:58 PM

## 2020-07-17 NOTE — Progress Notes (Signed)
ANTICOAGULATION CONSULT NOTE - Follow Up Consult  Pharmacy Consult for warfarin Indication: atrial fibrillation  Allergies  Allergen Reactions   Codeine Nausea Only    Pill form of codeine causes nausea   Statins Other (See Comments)    Myalgia     Patient Measurements: Height: 5\' 2"  (157.5 cm) Weight: 69.8 kg (153 lb 14.1 oz) IBW/kg (Calculated) : 54.6 Heparin Dosing Weight:   Vital Signs: Temp: 98.9 F (37.2 C) (07/07 0828) Temp Source: Oral (07/07 0828) BP: 111/100 (07/07 0828) Pulse Rate: 90 (07/07 0828)  Labs: Recent Labs    07/15/20 1750 07/15/20 1753 07/17/20 0041  HGB 18.3* 19.4* 17.1*  HCT 54.2* 57.0* 51.9  PLT 142*  --  95*  APTT 42*  --   --   LABPROT 20.3*  --  18.7*  INR 1.7*  --  1.6*  CREATININE 1.67* 1.70* 1.40*    Estimated Creatinine Clearance: 38.5 mL/min (A) (by C-G formula based on SCr of 1.4 mg/dL (H)).  Anticoag: INR 1.7 > 1.6 today, goal 2-3. - PTA dose warfarin 2.5mg  po daily except 3.75mg  on Mondays, Wednesdays, Fridays  Goal of Therapy:  INR 2-3 Monitor platelets by anticoagulation protocol: Yes   Plan:  Daily INR, s/s bleeding Warfarin 5mg  po x1  Liz [place], PharmD 07/17/2020,10:49 AM

## 2020-07-17 NOTE — Evaluation (Signed)
Speech Language Pathology Evaluation Patient Details Name: Vincent Carter MRN: [id number] DOB: May 29, 1945 Today's Date: 07/17/2020 Time: 1030-1053 SLP Time Calculation (min) (ACUTE ONLY): 23 min  Problem List:  Patient Active Problem List   Diagnosis Date Noted   PAF (paroxysmal atrial fibrillation) ([place]) 07/15/2020   CVA (cerebral vascular accident) ([place]) 07/15/2020   Encounter for therapeutic drug monitoring 05/22/2018   New onset atrial fibrillation ([place]) 03/25/2018   Atrial fibrillation with RVR ([place]) 03/25/2018   Acute on chronic systolic CHF (congestive heart failure) ([place]) 03/24/2018   COPD with acute exacerbation ([place]) 03/24/2018   Malignant neoplasm of prostate ([place]) 03/05/2014   Acute chest pain 09/08/2013   Vertigo 09/08/2013   Bigeminal rhythm 09/08/2013   ASCVD (arteriosclerotic cardiovascular disease)    Tobacco abuse, in remission    Low TSH level    Peripheral vascular disease ([place])    Colonic polyp 12/29/2009   Hypertension 12/09/2008   Emphysema lung ([place]) 12/09/2008   Past Medical History:  Past Medical History:  Diagnosis Date   Arthritis    ASCVD (arteriosclerotic cardiovascular disease) cardiologist--  is now dr Roderic Palau branch (was dr [name])    CABG in 1997 -- LIMA to LAD;  cath 07-17-2012 PCI and BMStenting;   RE-DO CABG X4  in  07-24-2004 three-vessel disease; cath in 08-24-2004 post CABG ef 40-45% w/ anteroapical hypokinesis; occlusion SVG  to  RI; patent RIMA to LAD; unsuccessful PCI in totally occluded RI graft.;  last myoview 09-09-2013, no ischemia, moderate inferior wall defect, ef 49%   CHF (congestive heart failure) (HCC)    Chronic obstructive pulmonary disease ([place])    Diverticulosis of colon    Euthyroid goiter    with low TSH    History of colon polyps    hyperplastic 2011   History of diverticulitis    History of kidney stones    History of ST elevation myocardial infarction (STEMI)    07-17-2004   w/ extensive RV infact per cath  report   HOH (hard of hearing)    no hearing aid   Hypertension    Low TSH level    Clinically euthyroid   Peripheral vascular disease ([place])    Decreased distal pulses and abnormal Doppler   Prostate cancer [facility]) urologist--  dr Jenny Reichmann wrenn   T1c, Gleason 3+4=7,  PSA 4.71,  vol 38cc   Ptosis    Left, secondary to injury   S/p bare metal coronary artery stent    x3  to RCA  07-17-2004   Past Surgical History:  Past Surgical History:  Procedure Laterality Date   [place]  08-24-2004  dr Lia Foyer   post re-do CABG/  Unsuccessful attempt PCI of clotted SVG to RI/  anteroapical segment severely hypokinetic,  ef 40-45%/  fresh occlusion SVG to RI,  continued patency of SVG to PAD and RIMA to LAD /   dLM 80%   CARDIOVASCULAR STRESS TEST  09-09-2013  dr Roderic Palau branch   no reversible ischemia,  moderately large defect noted in the inferior wall extending from  mid ventricle to apex also involves true ventricle apex and anteroapical wall/  hypokinesis of inferior wall in region of prior infarct/scarring/  LVEF 49%   CATARACT EXTRACTION W/ INTRAOCULAR LENS  IMPLANT, BILATERAL  2006   COLONOSCOPY W/ POLYPECTOMY  12-26-2009   CORONARY ANGIOPLASTY WITH STENT PLACEMENT  07-17-2004   dr Sabino Snipes   PCI w/ BMS x3 to RCA and balloon pump  placement/  LVSF after revascularization , ef 65% w/ mild inferior hypokinesis/  dLM  70% extending into origins of LAD (70% ostial, 80% mid, D1 branch 90% ostial)  CFX (80% ostial OM, 70% after first branch) /   99% mRCA stented and no residual;  remains 80% origin of posterior LV branch/  most apical portion of PDA was embolized    CORONARY ARTERY BYPASS GRAFT  1997   dr Merleen Nicely   LIMA to LAD   CORONARY ARTERY BYPASS GRAFT  07-24-2004  dr gerhardt   RIMA to LAD,  SVG to OM,  SVG to PDA,  SVG to RI   EYE SURGERY Left 1996   repair ocular injury   ORIF RIGHT TIB-FIB FX  1990's   PROSTATE BIOPSY     RADIOACTIVE SEED  IMPLANT N/A 05/02/2014   Procedure: RADIOACTIVE SEED IMPLANT;  Surgeon: Irine Seal, MD;  Location: [facility];  Service: Urology;  Laterality: N/A;   TONSILLECTOMY  age 50   TOTAL HIP ARTHROPLASTY Left 1983   RECONSTRUCTION/ THA/  SKIN AND BONE GRAFTS--  pt fell from 5 stories [place]   TRANSTHORACIC ECHOCARDIOGRAM  [id number]   diastolic dysfunction/  ef 45-50%/  mild AV calcification without stenosis,  mild AR/  mild LAE and RAE/  trivial MR and TR     HPI:  75 y.o. male with medical history significant of cardiovascular disease s/p CABG x 2 , history of atrial fibrillation and flutter for which he is on Coumadin.  He presents to the hospital today with a complaint of altered mental status.  He is accompanied by his daughter-in-law and other family members who state that he was last seen normal around 230 PM, at that time the patient had been leaving the car running for some reason but was his normal self.  He went to get in the car to go into town, he drove up to place of business and sat in his car for 15 minutes, he was found to be confused, altered, had left-sided facial droop and some focal unilateral weakness although it is not clear what side was weak.  The patient denies any symptoms including headache chest pain or shortness of breath.  He still has some confusion, level 5 caveat applies secondary to altered mental status.  The daughter in law is at the bedside stating he is still confused and altered MRI revealed on 07/17/20 Chronic lacunar infarcts within the bilateral corona radiata, basal ganglia, thalami and within the pons. Background chronic small vessel ischemic changes which are moderate in the cerebral white matter, and mild in the pons.  Small chronic infarcts within the bilateral cerebellar hemispheres; SLE generated.  Assessment / Plan / Recommendation Clinical Impression  Pt administered the SLUMS assessment ([place] Mental Status Examination)  with a score obtained of 21/30 with 25-30 being considered normal for a person with less than a high school education (10th grade reported by pt).  Pt demonstrated cognitive deficits within the following areas: memory recall, repetition (pt is [facility]), auditory comprehension for paragraph retention (also memory) and repeating numbers backwards past 3 digits.  Pt stated his "memory has been bad for a while" and MRI on 07/17/20 showed chronic lacunar/cerebellar infarcts.  Pt is likely functioning at baseline for cognition at this time.  If symptoms persist or family notices changes within normal routine, HH or OP SLP would be beneficial to return pt to baseline functioning.  Thank you for this consult.  Acute ST will s/o at this time.    SLP Assessment  SLP Recommendation/Assessment: All further Speech Lanaguage Pathology  needs can be addressed in the next venue of care SLP Visit Diagnosis: Cognitive communication deficit (R41.841)    Follow Up Recommendations  Outpatient SLP;Home health SLP;Other (comment) (prn if family notices cognitive changes)    Frequency and Duration  (evaluation only)         SLP Evaluation Cognition  Overall Cognitive Status: History of cognitive impairments - at baseline Arousal/Alertness: Awake/alert Orientation Level: Oriented X4 Attention: Sustained Sustained Attention: Appears intact Memory: Impaired Memory Impairment: Decreased recall of new information;Decreased short term memory;Retrieval deficit Decreased Short Term Memory: Verbal basic;Functional basic Immediate Memory Recall: Sock;Blue;Bed Memory Recall Sock: Without Cue Memory Recall Blue: Without Cue Memory Recall Bed: With Cue Awareness: Appears intact Problem Solving: Appears intact Safety/Judgment: Appears intact       Comprehension  Auditory Comprehension Overall Auditory Comprehension: Appears within functional limits for tasks assessed Commands: Within Functional Limits Conversation:  Simple Visual Recognition/Discrimination Discrimination: Within Function Limits Reading Comprehension Reading Status: Within funtional limits    Expression Expression Primary Mode of Expression: Verbal Verbal Expression Overall Verbal Expression: Appears within functional limits for tasks assessed Level of Generative/Spontaneous Verbalization: Conversation Naming: No impairment Pragmatics: No impairment Non-Verbal Means of Communication: Not applicable Written Expression Dominant Hand: Left Written Expression: Within Functional Limits   Oral / Motor  Oral Motor/Sensory Function Overall Oral Motor/Sensory Function: Within functional limits Motor Speech Overall Motor Speech: Appears within functional limits for tasks assessed Respiration: Impaired Level of Impairment: Conversation (d/t Covid dx) Phonation: Normal Resonance: Within functional limits Articulation: Within functional limitis Intelligibility: Intelligible Motor Planning: Witnin functional limits Motor Speech Errors: Not applicable Interfering Components: Hearing loss Effective Techniques: Increased vocal intensity;Over-articulate                      Elvina Sidle, M.S., CCC-SLP 07/17/2020, 1:00 PM

## 2020-07-17 NOTE — Progress Notes (Signed)
Subjective: No further episodes  Exam: Vitals:   07/17/20 0828 07/17/20 1236  BP: (!) 111/100 108/66  Pulse: 90 85  Resp: (!) 25 (!) 25  Temp: 98.9 F (37.2 C) 99.2 F (37.3 C)  SpO2: 97% 94%   Gen: In bed, NAD Resp: non-labored breathing, no acute distress Abd: soft, nt  Neuro: MS: Awake, alert, interactive and appropriate CN: Pupils equal round reactive, visual fields full Motor: 5/5 throughout Sensory: Intact to light touch  Pertinent Labs: Creatinine 1.4  Impression: 75 year old male with transient confusional episode of unclear etiology.  There was some hypoperfusion on CTP, but this could be seen in variety of different states and in the absence of an acute occlusion is of unclear significance.  I do think TIA is a possibility, but the semiology is very unusual especially the amnestic aspect of it.  A postictal state can also give a hypoperfusion deficit and would also have to be considered as a possibility.  Without further evidence, though I would not start antiepileptics.  I did inform him not to drive until released by outpatient physician.  Recommendations: 1) would consider transitioning to [place] if no contraindication 2) if he is on Coumadin, given that COVID infection can be hypercoagulable state, I would favor bridging while becoming therapeutic. 3) no further inpatient recommendations at this time, please call with further questions or concerns.  Roland Rack, MD [facility] [phone]  If 7pm- 7am, please page neurology on call as listed in [place].

## 2020-07-18 DIAGNOSIS — U071 COVID-19: Secondary | ICD-10-CM

## 2020-07-18 DIAGNOSIS — R7989 Other specified abnormal findings of blood chemistry: Secondary | ICD-10-CM

## 2020-07-18 DIAGNOSIS — N179 Acute kidney failure, unspecified: Principal | ICD-10-CM

## 2020-07-18 LAB — CBC WITH DIFFERENTIAL/PLATELET
Abs Immature Granulocytes: 0.03 10*3/uL (ref 0.00–0.07)
Basophils Absolute: 0.1 10*3/uL (ref 0.0–0.1)
Basophils Relative: 1 %
Eosinophils Absolute: 0.1 10*3/uL (ref 0.0–0.5)
Eosinophils Relative: 3 %
HCT: 53 % — ABNORMAL HIGH (ref 39.0–52.0)
Hemoglobin: 17.2 g/dL — ABNORMAL HIGH (ref 13.0–17.0)
Immature Granulocytes: 1 %
Lymphocytes Relative: 34 %
Lymphs Abs: 1.5 10*3/uL (ref 0.7–4.0)
MCH: 28.9 pg (ref 26.0–34.0)
MCHC: 32.5 g/dL (ref 30.0–36.0)
MCV: 89.1 fL (ref 80.0–100.0)
Monocytes Absolute: 0.7 10*3/uL (ref 0.1–1.0)
Monocytes Relative: 16 %
Neutro Abs: 2 10*3/uL (ref 1.7–7.7)
Neutrophils Relative %: 45 %
Platelets: 114 10*3/uL — ABNORMAL LOW (ref 150–400)
RBC: 5.95 MIL/uL — ABNORMAL HIGH (ref 4.22–5.81)
RDW: 15.2 % (ref 11.5–15.5)
WBC: 4.3 10*3/uL (ref 4.0–10.5)
nRBC: 0 % (ref 0.0–0.2)

## 2020-07-18 LAB — FERRITIN: Ferritin: 283 ng/mL (ref 24–336)

## 2020-07-18 LAB — COMPREHENSIVE METABOLIC PANEL
ALT: 14 U/L (ref 0–44)
AST: 20 U/L (ref 15–41)
Albumin: 3.1 g/dL — ABNORMAL LOW (ref 3.5–5.0)
Alkaline Phosphatase: 40 U/L (ref 38–126)
Anion gap: 6 (ref 5–15)
BUN: 23 mg/dL (ref 8–23)
CO2: 23 mmol/L (ref 22–32)
Calcium: 8.4 mg/dL — ABNORMAL LOW (ref 8.9–10.3)
Chloride: 109 mmol/L (ref 98–111)
Creatinine, Ser: 1.21 mg/dL (ref 0.61–1.24)
GFR, Estimated: >60 mL/min (ref >60)
Glucose, Bld: 93 mg/dL (ref 70–99)
Potassium: 4 mmol/L (ref 3.5–5.1)
Sodium: 138 mmol/L (ref 135–145)
Total Bilirubin: 1 mg/dL (ref 0.3–1.2)
Total Protein: 5.5 g/dL — ABNORMAL LOW (ref 6.5–8.1)

## 2020-07-18 LAB — PROTIME-INR
INR: 1.8 — ABNORMAL HIGH (ref 0.8–1.2)
Prothrombin Time: 21.2 seconds — ABNORMAL HIGH (ref 11.4–15.2)

## 2020-07-18 LAB — PHOSPHORUS: Phosphorus: 1.6 mg/dL — ABNORMAL LOW (ref 2.5–4.6)

## 2020-07-18 LAB — MAGNESIUM: Magnesium: 2.1 mg/dL (ref 1.7–2.4)

## 2020-07-18 LAB — VITAMIN D 25 HYDROXY (VIT D DEFICIENCY, FRACTURES): Vit D, 25-Hydroxy: 80.56 ng/mL (ref 30–100)

## 2020-07-18 LAB — C-REACTIVE PROTEIN: CRP: 4.5 mg/dL — ABNORMAL HIGH (ref <1.0)

## 2020-07-18 LAB — D-DIMER, QUANTITATIVE: D-Dimer, Quant: 0.92 ug/mL-FEU — ABNORMAL HIGH (ref 0.00–0.50)

## 2020-07-18 MED ORDER — WARFARIN SODIUM 2.5 MG PO TABS: ORAL_TABLET | ORAL | 6 refills | Status: DC

## 2020-07-18 MED ORDER — LISINOPRIL 2.5 MG PO TABS: 2.5000 mg | ORAL_TABLET | Freq: Every day | ORAL | 11 refills | Status: DC

## 2020-07-18 MED ORDER — CARVEDILOL 12.5 MG PO TABS: 12.5000 mg | ORAL_TABLET | Freq: Two times a day (BID) | ORAL | 0 refills | Status: DC

## 2020-07-18 MED ORDER — WARFARIN SODIUM 5 MG PO TABS: 5.0000 mg | ORAL_TABLET | ORAL | Status: DC

## 2020-07-18 MED ORDER — WARFARIN SODIUM 2.5 MG PO TABS
ORAL_TABLET | ORAL | 6 refills | Status: DC
Start: 2020-07-18 — End: 2020-07-18

## 2020-07-18 MED ORDER — FUROSEMIDE 20 MG PO TABS: 20.0000 mg | ORAL_TABLET | Freq: Every day | ORAL | 3 refills | Status: AC

## 2020-07-18 NOTE — Discharge Summary (Signed)
Physician Discharge Summary  Vincent Carter TZG:[id number] DOB: 21-May-1945 DOA: 07/15/2020  PCP: [name], MD  Admit date: 07/15/2020 Discharge date: 07/18/2020  Admitted From: Home Disposition: Home  Recommendations for Outpatient Follow-up:  Follow ups as below. Check INR in 3 to 4 days Please obtain CBC/BMP/Mag at follow up Check blood pressure at follow-up. Please follow up on the following pending results: None  Home Health: None indicated Equipment/Devices: None indicated  Discharge Condition: Stable CODE STATUS: Full code   Follow-up Information     [name], MD. Schedule an appointment as soon as possible for a visit in 1 week(s).   Specialty: Internal Medicine Contact information: [address] [place] [address] [phone]         Arnoldo Lenis, MD. Schedule an appointment as soon as possible for a visit in 2 week(s).   Specialty: Cardiology Contact information: [place] [address] [phone]                Hospital Course: 75 year old M with PMH of CAD/CABG, A. fib/flutter on warfarin, COPD, systolic CHF and PAD presented to [facility] ED with altered mental status, "disorientation" and left facial droop.  There was concern about right cerebral watershed infarct on CT perfusion study.  He was transferred to [facility] for further evaluation with MRI brain, and neurology evaluation.  MRI brain without acute finding.  TTE with LVEF of 45 to 50%.  EEG negative for seizure activity.  Evaluated by neurology.  He had MR venogram which is also negative. Neurology recommended not driving at least for 6 months unless cleared.    Patient tested positive for COVID-19 and was started on remdesivir.  Per patient, he is fully vaccinated including booster.  He also had AKI that has resolved with IV fluid and holding his medications.  Patient remained stable throughout his hospitalization.   He completed 3 days  of remdesivir for COVID-19 infection.   He had no respiratory distress or oxygen requirement. Evaluated by therapy and no needles identified.   See individual problem list below for more on hospital course.  Discharge Diagnoses:  Transient confusion versus transient ischemic attack: presented with transient disorientation and left facial droop.  His symptoms resolved while in ED.  CT perfusion study raise concern for right cerebral watershed infarct but MRI brain negative for acute finding.  MR venogram without acute finding.  TTE with LVEF of 45 to 50%.  EEG negative for seizure.  His INR was subtherapeutic.  A1c 5.4%.  LDL 36.  UDS and ethanol level negative.  COVID-19 PCR positive. Suspect his symptoms to be due to dehydration as evidenced by AKI and polycythemia.  Symptoms resolved.  He remained stable throughout his hospitalization. -Neurology recommended not driving for 6 months  unless cleared -Continue anticoagulation for A. fib/flutter -Completed 3 days of remdesivir for COVID-19 infection. -AKI resolved with gentle hydration   COVID infection: Could be contributing to his symptoms.  Has cough but no other significant respiratory symptoms.  Vaccinated including booster Recent Labs    07/15/20 2234 07/17/20 0041 07/18/20 0201  DDIMER 0.95* 0.92* 0.92*  FERRITIN 264 269 283  LDH 120  --   --   CRP  --  9.4* 4.5*  -Completed remdesivir for 3 days. -Encouraged isolation/precaution for a total of 10 days from 7/5.   Paroxysmal atrial fibrillation: On Coumadin.  Not on DOAC due to cost.  Also reports retinal hemorrhage with Eliquis.  INR 1.8. -  Changed warfarin regimen to 3.75 mg from MWF to MWFS.  He will take 2.5 mg on other days -Decrease Coreg from 18.75 mg to 12.5 mg twice daily   Chronic systolic CHF-TTE with LVEF of 45 to 50% (improved), indeterminate DD, moderate RAE.  No cardiopulmonary symptoms.  Appears euvolemic.  Not on diuretics at home other than HCTZ for  BP. -Discontinued HCTZ and started Lasix 20 mg daily starting 7/12 -P.o. Coreg as above -Patient to hold his lisinopril until 7/12 -Outpatient follow-up with cardiologist   Polycythemia: Due to COPD?  Reports childhood OSA. Recent Labs    07/15/20 1750 07/15/20 1753 07/17/20 0041 07/18/20 0201  HGB 18.3* 19.4* 17.1* 17.2*  -Recommend outpatient sleep study -Recheck CBC at follow-up     Episodic dizziness: Reports intermittent dizziness.  Not necessarily positional.  Denies vertigo.  Orthostatic vitals negative.   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 Recent Labs    07/15/20 1750 07/15/20 1753 07/17/20 0041 07/18/20 0201  BUN 18 18 26* 23  CREATININE 1.67* 1.70* 1.40* 1.21  -Adjusted cardiac meds as above -Recheck renal function at follow-up    History of CAD s/p remote CABG/Stents: Stable. -Cardiac meds as above   History of COPD: Stable. -Continue home breathing treatments as needed   Hypokalemia: Likely due to HCTZ.  Resolved.   Thrombocytopenia: Likely due to COVID-19 infection: Improved. Recent Labs  Lab 07/15/20 1750 07/17/20 0041 07/18/20 0201  PLT 142* 95* 114*  -Recheck CBC at follow-up   Family communication: Updated patient's daughter over the phone  Body mass index is 27.94 kg/m.            Discharge Exam: Vitals:   07/18/20 0430 07/18/20 0822  BP: 128/90 (!) 135/99  Pulse: 84 86  Resp: 18 20  Temp: 98.4 F (36.9 C) 98.7 F (37.1 C)  SpO2: 99% 97%    GENERAL: No apparent distress.  Nontoxic. HEENT: MMM.  Vision and hearing grossly intact.  NECK: Supple.  No apparent JVD.  RESP: On RA.  No IWOB.  Fair aeration bilaterally. CVS:  RRR. Heart sounds normal.  ABD/GI/GU: Bowel sounds present. Soft. Non tender.  MSK/EXT:  Moves extremities. No apparent deformity. No edema.  SKIN: no apparent skin lesion or wound NEURO: Awake, alert and oriented appropriately.  No apparent  focal neuro deficit. PSYCH: Calm. Normal affect.   Discharge Instructions  Discharge Instructions     (HEART FAILURE PATIENTS) Call MD:  Anytime you have any of the following symptoms: 1) 3 pound weight gain in 24 hours or 5 pounds in 1 week 2) shortness of breath, with or without a dry hacking cough 3) swelling in the hands, feet or stomach 4) if you have to sleep on extra pillows at night in order to breathe.   Complete by: As directed    Call MD for:  difficulty breathing, headache or visual disturbances   Complete by: As directed    Call MD for:  extreme fatigue   Complete by: As directed    Call MD for:  persistant dizziness or light-headedness   Complete by: As directed    Call MD for:  temperature >100.4   Complete by: As directed    Diet - low sodium heart healthy   Complete by: As directed    Discharge instructions   Complete by: As directed    It has been a pleasure taking care of you!  You were hospitalized with transient  confusion and facial droop.  The cause is not quite clear but could be from dehydration and COVID-19 infection.  Your MRI did not show stroke.  Your EEG did not show seizure but this does not mean seizure is completely off the table.  As a result, our neurologist recommended not driving for the next 6 months unless you are cleared by your doctors.  We made adjustment to your home medications during this hospitalization.  Please review your new medication list and the directions on your medications before you take them. Please have your INR rechecked early next week.  Please follow-up with your primary care doctor and cardiologist in 1 to 2 weeks.  In regards to COVID, please isolate yourself for 10 days from the day you tested positive for COVID-19.   Take care,   Increase activity slowly   Complete by: As directed       Allergies as of 07/18/2020       Reactions   Codeine Nausea Only   Pill form of codeine causes nausea   Statins Other (See Comments)    Myalgia         Medication List     STOP taking these medications    DIUREX PO   hydrochlorothiazide 25 MG tablet Commonly known as: HYDRODIURIL   Vitamin D3 1.25 MG (50000 UT) Caps       TAKE these medications    albuterol 108 (90 Base) MCG/ACT inhaler Commonly known as: VENTOLIN HFA Inhale 2 puffs into the lungs every 6 (six) hours as needed for wheezing or shortness of breath.   carvedilol 12.5 MG tablet Commonly known as: COREG Take 1 tablet (12.5 mg total) by mouth 2 (two) times daily with a meal. What changed: See the new instructions.   Fish Oil 300 MG Caps Take 2 capsules by mouth 2 (two) times daily.   fluticasone 50 MCG/ACT nasal spray Commonly known as: FLONASE Place 1 spray into both nostrils 2 (two) times daily.   furosemide 20 MG tablet Commonly known as: Lasix Take 1 tablet (20 mg total) by mouth daily. Start taking on: July 22, 2020   ipratropium-albuterol 0.5-2.5 (3) MG/3ML Soln Commonly known as: DUONEB Take 3 mLs by nebulization every 6 (six) hours as needed.   lisinopril 2.5 MG tablet Commonly known as: ZESTRIL Take 1 tablet (2.5 mg total) by mouth daily. Start taking on: July 22, 2020 What changed: These instructions start on July 22, 2020. If you are unsure what to do until then, ask your doctor or other care provider.   montelukast 10 MG tablet Commonly known as: SINGULAIR Take 1 tablet (10 mg total) by mouth at bedtime.   Nebulizer Misc Please provide 1 nebulizer machine and all supplies. Diagnosis: J44.1, Office number: [phone]   nitroGLYCERIN 0.4 MG SL tablet Commonly known as: NITROSTAT PLACE 1 TABLET UNDER TONGUE EVERY 5 MINUTES AS NEEDED FOR CHEST PAIN.   warfarin 2.5 MG tablet Commonly known as: COUMADIN Take as directed. If you are unsure how to take this medication, talk to your nurse or doctor. Original instructions: Take 1.5 tablets (3.75 mg total) every Monday, Wednesday, Friday, and Saturday, and 1 tablet  (2.5 mg) on other days What changed: additional instructions       ASK your doctor about these medications    dextromethorphan-guaiFENesin 30-600 MG 12hr tablet Commonly known as: MUCINEX DM Take 1 tablet by mouth 2 (two) times daily.        Consultations: Neurology  Procedures/Studies:  DG Chest 1 View  Result Date: 07/16/2020 CLINICAL DATA:  COVID-19 positive patient. EXAM: CHEST  1 VIEW COMPARISON:  PA and lateral chest 06/06/2020 and 03/24/2018. FINDINGS: Linear atelectasis or scar in the luteal a is unchanged. Mild right basilar atelectasis noted. Lungs otherwise clear. Cardiomegaly. No pneumothorax or pleural fluid. The patient is status post CABG. IMPRESSION: Cardiomegaly without acute disease. Electronically Signed   By: Inge Rise M.D.   On: 07/16/2020 10:56   MR BRAIN WO CONTRAST  Result Date: 07/16/2020 CLINICAL DATA:  Neuro deficit, acute, stroke suspected. Additional history provided: Confusion, altered, left-sided facial droop and some focal unilateral weakness. EXAM: MRI HEAD WITHOUT CONTRAST TECHNIQUE: Multiplanar, multiecho pulse sequences of the brain and surrounding structures were obtained without intravenous contrast. COMPARISON:  Noncontrast head CT and CT angiogram head/neck 07/15/2020. FINDINGS: Brain: The patient was unable to tolerate the full examination. As a result, the following sequences were obtained: Axial diffusion-weighted imaging, coronal diffusion-weighted imaging, sagittal T1 weighted sequence, axial T2 sequence, axial T2* sequence. The sagittal T1 weighted sequence is mildly motion degraded. Mild-to-moderate generalized cerebral atrophy. Comparatively mild cerebellar atrophy. Chronic lacunar infarcts within the bilateral corona radiata, basal ganglia, thalami and within the pons. Background moderate multifocal T2/FLAIR hyperintensity within the cerebral white matter, nonspecific but compatible with chronic small vessel ischemic disease. Mild  background chronic small vessel ischemic changes are also present within the pons. Small chronic infarcts within the bilateral cerebellar hemispheres. There are a few scattered chronic microhemorrhages within the bilateral basal ganglia and thalami. There is no acute infarct. No evidence of an intracranial mass. No extra-axial fluid collection. No midline shift. Vascular: Expected proximal arterial flow voids. Skull and upper cervical spine: No focal suspicious marrow lesion. Incompletely assessed cervical spondylosis. Sinuses/Orbits: Visualized orbits show no acute finding. Bilateral lens replacements. Mild bilateral maxillary sinus mucosal thickening. IMPRESSION: Prematurely terminated examination, as described. No evidence of acute intracranial abnormality. Chronic lacunar infarcts within the bilateral corona radiata, basal ganglia, thalami and within the pons. Background chronic small vessel ischemic changes which are moderate in the cerebral white matter, and mild in the pons. Small chronic infarcts within the bilateral cerebellar hemispheres. Central predominant chronic microhemorrhages, likely reflecting sequela of hypertensive microangiopathy. Mild-to-moderate generalized cerebral atrophy with comparatively mild cerebellar atrophy. Electronically Signed   By: Kellie Simmering DO   On: 07/16/2020 07:46   MR Venogram Head  Result Date: 07/17/2020 CLINICAL DATA:  Headache.  Evaluate for DVST. EXAM: MR VENOGRAM HEAD WITHOUT AND WITH CONTRAST TECHNIQUE: Angiographic images of the intracranial venous structures were acquired using MRV technique without and with intravenous contrast. CONTRAST:  7.82mL GADAVIST GADOBUTROL 1 MMOL/ML IV SOLN COMPARISON:  MRI yesterday. FINDINGS: No dural venous sinus thrombosis or stenosis. Deep veins are symmetrically flowing. Postcontrast images of the brain show no worrisome enhancement. IMPRESSION: Negative MRV. Electronically Signed   By: Monte Fantasia M.D.   On: 07/17/2020 08:23    EEG adult  Result Date: 07/16/2020 Lora Havens, MD     07/16/2020  5:53 PM Patient Name: Vincent Carter MRN: [id number] Epilepsy Attending: Lora Havens Referring Physician/Provider: Dr Lesleigh Noe Date: 07/16/2020 Duration: 24.01 mins Patient history: 75 year old gentleman with significant cardiac history as above presenting with intermittent transient neurological symptoms. EEG to evaluate for seizure Level of alertness: Awake, asleep AEDs during EEG study: None Technical aspects: This EEG study was done with scalp electrodes positioned according to the 10-20 International system of electrode placement. Electrical activity was acquired at a sampling rate  of 500Hz  and reviewed with a high frequency filter of 70Hz  and a low frequency filter of 1Hz . EEG data were recorded continuously and digitally stored. Description: The posterior dominant rhythm consists of 8 Hz activity of moderate voltage (25-35 uV) seen predominantly in posterior head regions, symmetric and reactive to eye opening and eye closing. Sleep was characterized by vertex waves, sleep spindles (12 to 14 Hz), maximal frontocentral region. EEG showed intermittent generalized 3 to 6 Hz theta-delta slowing. Physiologic photic driving was not seen during photic stimulation.  Hyperventilation was not performed.   ABNORMALITY - Intermittent slow, generalized IMPRESSION: This study is suggestive of mild diffuse encephalopathy, nonspecific etiology. No seizures or definite epileptiform discharges were seen throughout the recording. Lora Havens   ECHOCARDIOGRAM COMPLETE  Result Date: 07/16/2020    ECHOCARDIOGRAM REPORT   Patient Name:   Vincent Carter Date of Exam: 07/16/2020 Medical Rec #:  [id number]          Height:       62.0 in Accession #:    [id number]         Weight:       154.1 lb Date of Birth:  04/24/1945         BSA:          79.711 m Patient Age:    75 years           BP:           110/74 mmHg Patient Gender: M                   HR:           79 bpm. Exam Location:  [facility] Procedure: 2D Echo, Cardiac Doppler and Color Doppler Indications:    Stroke  History:        Patient has prior history of Echocardiogram examinations, most                 recent 03/24/2018. CHF, CAD, Prior CABG, Stroke and COPD,                 Arrythmias:LBBB and Atrial Fibrillation; Risk                 Factors:Hypertension.  Sonographer:    Wenda Low Referring Phys: [place]  1. Septal apical and inferior basal hypokinesis . Left ventricular ejection fraction, by estimation, is 45 to 50%. The left ventricle has mildly decreased function. The left ventricle has no regional wall motion abnormalities. There is mild left ventricular hypertrophy. Left ventricular diastolic parameters are indeterminate.  2. Right ventricular systolic function is normal. The right ventricular size is normal. There is normal pulmonary artery systolic pressure.  3. Left atrial size was mildly dilated.  4. Right atrial size was moderately dilated.  5. The mitral valve is normal in structure. Trivial mitral valve regurgitation. No evidence of mitral stenosis.  6. The aortic valve is tricuspid. There is mild calcification of the aortic valve. Aortic valve regurgitation is trivial. Mild aortic valve sclerosis is present, with no evidence of aortic valve stenosis.  7. The inferior vena cava is normal in size with greater than 50% respiratory variability, suggesting right atrial pressure of 3 mmHg. FINDINGS  Left Ventricle: Septal apical and inferior basal hypokinesis. Left ventricular ejection fraction, by estimation, is 45 to 50%. The left ventricle has mildly decreased function. The left ventricle has no regional wall motion abnormalities. The left ventricular internal cavity size  was normal in size. There is mild left ventricular hypertrophy. Left ventricular diastolic parameters are indeterminate. Right Ventricle: The right ventricular size is normal. No  increase in right ventricular wall thickness. Right ventricular systolic function is normal. There is normal pulmonary artery systolic pressure. The tricuspid regurgitant velocity is 1.80 m/s, and  with an assumed right atrial pressure of 8 mmHg, the estimated right ventricular systolic pressure is 23.5 mmHg. Left Atrium: Left atrial size was mildly dilated. Right Atrium: Right atrial size was moderately dilated. Pericardium: There is no evidence of pericardial effusion. Mitral Valve: The mitral valve is normal in structure. Trivial mitral valve regurgitation. No evidence of mitral valve stenosis. MV peak gradient, 4.4 mmHg. The mean mitral valve gradient is 1.0 mmHg. Tricuspid Valve: The tricuspid valve is normal in structure. Tricuspid valve regurgitation is trivial. No evidence of tricuspid stenosis. Aortic Valve: The aortic valve is tricuspid. There is mild calcification of the aortic valve. Aortic valve regurgitation is trivial. Aortic regurgitation PHT measures 567 msec. Mild aortic valve sclerosis is present, with no evidence of aortic valve stenosis. Aortic valve mean gradient measures 3.0 mmHg. Aortic valve peak gradient measures 5.7 mmHg. Aortic valve area, by VTI measures 2.08 cm. Pulmonic Valve: The pulmonic valve was normal in structure. Pulmonic valve regurgitation is not visualized. No evidence of pulmonic stenosis. Aorta: The aortic root is normal in size and structure. Venous: The inferior vena cava is normal in size with greater than 50% respiratory variability, suggesting right atrial pressure of 3 mmHg. IAS/Shunts: No atrial level shunt detected by color flow Doppler.  LEFT VENTRICLE PLAX 2D LVIDd:         4.23 cm     Diastology LVIDs:         3.28 cm     LV e' medial:    6.98 cm/s LV PW:         1.22 cm     LV E/e' medial:  11.8 LV IVS:        1.25 cm     LV e' lateral:   8.78 cm/s LVOT diam:     2.00 cm     LV E/e' lateral: 9.4 LV SV:         48 LV SV Index:   28 LVOT Area:     3.14 cm  LV  Volumes (MOD) LV vol d, MOD A2C: 65.1 ml LV vol d, MOD A4C: 63.5 ml LV vol s, MOD A2C: 41.8 ml LV vol s, MOD A4C: 32.9 ml LV SV MOD A2C:     23.3 ml LV SV MOD A4C:     63.5 ml LV SV MOD BP:      25.5 ml RIGHT VENTRICLE RV Basal diam:  4.45 cm RV Mid diam:    3.99 cm RV S prime:     8.18 cm/s TAPSE (M-mode): 1.9 cm LEFT ATRIUM             Index       RIGHT ATRIUM           Index LA diam:        4.30 cm 2.51 cm/m  RA Area:     26.90 cm LA Vol (A2C):   30.3 ml 17.71 ml/m RA Volume:   92.20 ml  53.88 ml/m LA Vol (A4C):   62.0 ml 36.23 ml/m LA Biplane Vol: 45.1 ml 26.36 ml/m  AORTIC VALVE AV Area (Vmax):    2.22 cm AV Area (Vmean):   1.97 cm AV  Area (VTI):     2.08 cm AV Vmax:           119.00 cm/s AV Vmean:          84.500 cm/s AV VTI:            0.231 m AV Peak Grad:      5.7 mmHg AV Mean Grad:      3.0 mmHg LVOT Vmax:         84.20 cm/s LVOT Vmean:        53.100 cm/s LVOT VTI:          0.153 m LVOT/AV VTI ratio: 0.66 AI PHT:            567 msec  AORTA Ao Root diam: 3.50 cm Ao Asc diam:  3.00 cm MITRAL VALVE               TRICUSPID VALVE MV Area (PHT): 4.71 cm    TR Peak grad:   13.0 mmHg MV Area VTI:   1.79 cm    TR Vmax:        180.00 cm/s MV Peak grad:  4.4 mmHg MV Mean grad:  1.0 mmHg    SHUNTS MV Vmax:       1.05 m/s    Systemic VTI:  0.15 m MV Vmean:      42.8 cm/s   Systemic Diam: 2.00 cm MV Decel Time: 161 msec MV E velocity: 82.50 cm/s MV A velocity: 48.50 cm/s MV E/A ratio:  1.70 Jenkins Rouge MD Electronically signed by Jenkins Rouge MD Signature Date/Time: 07/16/2020/1:43:33 PM    Final    CT HEAD CODE STROKE WO CONTRAST  Result Date: 07/15/2020 CLINICAL DATA:  Code stroke. Left-sided facial droop, confusion, weak arms, l difficulty walking, confusion. EXAM: CT HEAD WITHOUT CONTRAST TECHNIQUE: Contiguous axial images were obtained from the base of the skull through the vertex without intravenous contrast. COMPARISON:  Prior head CT 09/08/2013. FINDINGS: Brain: Mild to moderate generalized cerebral  atrophy. Comparatively mild cerebellar atrophy. Patchy and ill-defined hypoattenuation within the cerebral white matter, nonspecific but most often secondary to chronic small vessel ischemia. Findings have progressed as compared to the head CT of 09/08/2013 white matter hypodensity is somewhat more prominent within the right frontal lobe centrum semiovale and corona radiata and a superimposed acute white matter infarct at this site is difficult to exclude. Chronic appearing lacunar infarcts within the deep gray nuclei, progressed from the prior head CT of 09/08/2013. Chronic small chronic infarct within the callosal body also new from the prior exam (series 6, image 25). No acute cortical infarct or acute intracranial hemorrhage is identified. No extra-axial fluid collection. No evidence of an intracranial mass. No midline shift. Vascular: No hyperdense vessel.  Atherosclerotic calcifications Skull: Normal. Negative for fracture or focal lesion. Sinuses/Orbits: Visualized orbits show no acute finding. No significant paranasal sinus disease at the imaged levels. ASPECTS ([place] Stroke Program Early CT Score) - Ganglionic level infarction (caudate, lentiform nuclei, internal capsule, insula, M1-M3 cortex): 7 - Supraganglionic infarction (M4-M6 cortex): 3 Total score (0-10 with 10 being normal): 10 These results were called by telephone at the time of interpretation on 07/15/2020 at 6:17 pm to provider [facility] , who verbally acknowledged these results. IMPRESSION: No acute intracranial hemorrhage or acute demarcated cortical infarction. Patchy and ill-defined hypoattenuation within the cerebral white matter compatible with chronic small vessel ischemic disease. Findings have progressed as compared to the prior head CT of 09/08/2013. White matter hypodensity is  somewhat more prominent within the right frontal lobe centrum semiovale and corona radiata, and a superimposed acute white matter infarct is difficult to  definitively exclude. A brain MRI may be obtained for further evaluation, as clinically warranted. Chronic appearing lacunar infarcts within the deep gray nuclei, progressed in number from the head CT of 09/08/2013. A small chronic infarct within the callosal body is new from the prior exam. Progressive parenchymal atrophy. Electronically Signed   By: Kellie Simmering DO   On: 07/15/2020 18:17   CT ANGIO HEAD NECK W WO CM W PERF (CODE STROKE)  Result Date: 07/15/2020 CLINICAL DATA:  Neuro deficit, acute, stroke suspected. Left-sided facial droop, difficulty walking. Confusion. EXAM: CT ANGIOGRAPHY HEAD AND NECK CT PERFUSION BRAIN TECHNIQUE: Multidetector CT imaging of the head and neck was performed using the standard protocol during bolus administration of intravenous contrast. Multiplanar CT image reconstructions and MIPs were obtained to evaluate the vascular anatomy. Carotid stenosis measurements (when applicable) are obtained utilizing NASCET criteria, using the distal internal carotid diameter as the denominator. Multiphase CT imaging of the brain was performed following IV bolus contrast injection. Subsequent parametric perfusion maps were calculated using RAPID software. CONTRAST:  17mL OMNIPAQUE IOHEXOL 350 MG/ML SOLN COMPARISON:  Noncontrast head CT performed earlier today 07/15/2020. FINDINGS: CTA NECK FINDINGS Aortic arch: Standard aortic branching. Atherosclerotic plaque within the visualized aortic arch and proximal major branch vessels of the neck. Right carotid system: CCA and ICA patent within neck without hemodynamically significant stenosis (50% or greater). Mild soft and calcified plaque within the carotid bifurcation and proximal ICA. Mild soft and calcified plaque is also present within the mid to distal cervical ICA. Left carotid system: CCA and ICA patent within the neck without hemodynamically significant stenosis (50% or greater) mild to moderate soft and calcified plaque within the distal  CCA, carotid bifurcation and proximal ICA. Mild soft and calcified plaques also present within the mid cervical ICA. Vertebral arteries: Vertebral arteries patent within the neck. The right vertebral artery is dominant. No hemodynamically significant stenosis within the right vertebral artery within the neck. Moderate/severe atherosclerotic stenosis at the origin of the non dominant left vertebral artery. Skeleton: Cervical spondylosis. No acute bony abnormality or aggressive osseous lesion. Other neck: No neck mass or cervical lymphadenopathy. Upper chest: Prior median sternotomy. No consolidation within the imaged lung apices. Review of the MIP images confirms the above findings CTA HEAD FINDINGS Anterior circulation: The intracranial internal carotid arteries are patent. Calcified plaque within both vessels with no more than mild stenosis. The M1 middle cerebral arteries are patent. Atherosclerotic irregularity of the M2 and more distal middle cerebral artery branches bilaterally. However, no M2 proximal branch occlusion or high-grade proximal M2 stenosis is identified. The anterior cerebral arteries are patent. No intracranial aneurysm is identified. Posterior circulation: The intracranial vertebral arteries are patent. Calcified plaque within the V4 left vertebral artery with no more than mild stenosis. Nonstenotic calcified plaque within the V4 right vertebral artery. The basilar artery is patent. The posterior cerebral arteries are patent. Posterior communicating arteries are present bilaterally. Venous sinuses: Within the limitations of contrast timing, no convincing thrombus. Anatomic variants: None significant Review of the MIP images confirms the above findings CT Brain Perfusion Findings: CBF (<30%) Volume: 15mL Perfusion (Tmax>6.0s) volume: 17mL (right MCA vascular territory and right MCA/PCA watershed territory). Mismatch Volume: 13 mLmL Infarction Location:None identified These results were  communicated to Dr. Curly Shores At 7:18 pmon 07/15/2020 by text page via the [facility] messaging system. IMPRESSION: CTA  neck: 1. The common carotid and internal carotid arteries are patent within the neck without hemodynamically significant stenosis. Atherosclerotic disease within both carotid systems, as described. 2. Vertebral arteries patent within the neck. Moderate/severe atherosclerotic stenosis at the origin of the non-dominant left vertebral artery. CTA head: Intracranial atherosclerotic disease, as described. No intracranial large vessel occlusion or proximal high-grade arterial stenosis is identified. CT perfusion head: The perfusion software identifies patchy foci of hypoperfusion within the right cerebral hemisphere (MCA territory and MCA/PCA watershed territory) totaling 13 mL (utilizing the Tmax>6 seconds threshold). Reported mismatch 13 mL. Electronically Signed   By: Kellie Simmering DO   On: 07/15/2020 19:19       The results of significant diagnostics from this hospitalization (including imaging, microbiology, ancillary and laboratory) are listed below for reference.     Microbiology: Recent Results (from the past 240 hour(s))  Resp Panel by RT-PCR (Flu A&B, Covid) Nasopharyngeal Swab     Status: Abnormal   Collection Time: 07/15/20  6:12 PM   Specimen: Nasopharyngeal Swab; Nasopharyngeal(NP) swabs in vial transport medium  Result Value Ref Range Status   SARS Coronavirus 2 by RT PCR POSITIVE (A) NEGATIVE Final    Comment: RESULT CALLED TO, READ BACK BY AND VERIFIED WITH: GESELL,C @ 1951 ON 07/15/20 BY JUW (NOTE) SARS-CoV-2 target nucleic acids are DETECTED.  The SARS-CoV-2 RNA is generally detectable in upper respiratory specimens during the acute phase of infection. Positive results are indicative of the presence of the identified virus, but do not rule out bacterial infection or co-infection with other pathogens not detected by the test. Clinical correlation with patient history  and other diagnostic information is necessary to determine patient infection status. The expected result is Negative.  Fact Sheet for Patients: EntrepreneurPulse.com.au  Fact Sheet for Healthcare Providers: IncredibleEmployment.be  This test is not yet approved or cleared by the [place] FDA and  has been authorized for detection and/or diagnosis of SARS-CoV-2 by FDA under an Emergency Use Authorization (EUA).  This EUA will remain in effect (meaning this test can be  used) for the duration of  the COVID-19 declaration under Section 564(b)(1) of the Act, 21 U.S.C. section 360bbb-3(b)(1), unless the authorization is terminated or revoked sooner.     Influenza A by PCR NEGATIVE NEGATIVE Final   Influenza B by PCR NEGATIVE NEGATIVE Final    Comment: (NOTE) The Xpert Xpress SARS-CoV-2/FLU/RSV plus assay is intended as an aid in the diagnosis of influenza from Nasopharyngeal swab specimens and should not be used as a sole basis for treatment. Nasal washings and aspirates are unacceptable for Xpert Xpress SARS-CoV-2/FLU/RSV testing.  Fact Sheet for Patients: EntrepreneurPulse.com.au  Fact Sheet for Healthcare Providers: IncredibleEmployment.be  This test is not yet approved or cleared by the [place] FDA and has been authorized for detection and/or diagnosis of SARS-CoV-2 by FDA under an Emergency Use Authorization (EUA). This EUA will remain in effect (meaning this test can be used) for the duration of the COVID-19 declaration under Section 564(b)(1) of the Act, 21 U.S.C. section 360bbb-3(b)(1), unless the authorization is terminated or revoked.  Performed at [facility], [address] [place], [place] [address]      Labs:  CBC: Recent Labs  Lab 07/15/20 1750 07/15/20 1753 07/17/20 0041 07/18/20 0201  WBC 9.1  --  5.5 4.3  NEUTROABS 6.9  --  2.8 2.0  HGB 18.3* 19.4* 17.1* 17.2*  HCT  54.2* 57.0* 51.9 53.0*  MCV 89.7  --  88.7 89.1  PLT 142*  --  95* 114*   BMP &GFR Recent Labs  Lab 07/15/20 1750 07/15/20 1753 07/17/20 0041 07/18/20 0201  NA 133* 137 135 138  K 3.3* 3.4* 3.4* 4.0  CL 98 97* 105 109  CO2 26  --  23 23  GLUCOSE 107* 107* 91 93  BUN 18 18 26* 23  CREATININE 1.67* 1.70* 1.40* 1.21  CALCIUM 9.0  --  8.3* 8.4*  MG  --   --  2.2 2.1  PHOS  --   --   --  1.6*   Estimated Creatinine Clearance: 44.4 mL/min (by C-G formula based on SCr of 1.21 mg/dL). Liver & Pancreas: Recent Labs  Lab 07/15/20 1750 07/17/20 0041 07/18/20 0201  AST 14* 22 20  ALT 14 13 14   ALKPHOS 56 43 40  BILITOT 3.0* 1.2 1.0  PROT 6.7 5.6* 5.5*  ALBUMIN 4.0 3.2* 3.1*   No results for input(s): LIPASE, AMYLASE in the last 168 hours. No results for input(s): AMMONIA in the last 168 hours. Diabetic: Recent Labs    07/16/20 0330  HGBA1C 5.4   No results for input(s): GLUCAP in the last 168 hours. Cardiac Enzymes: No results for input(s): CKTOTAL, CKMB, CKMBINDEX, TROPONINI in the last 168 hours. No results for input(s): PROBNP in the last 8760 hours. Coagulation Profile: Recent Labs  Lab 07/15/20 1750 07/17/20 0041 07/18/20 0201  INR 1.7* 1.6* 1.8*   Thyroid Function Tests: No results for input(s): TSH, T4TOTAL, FREET4, T3FREE, THYROIDAB in the last 72 hours. Lipid Profile: Recent Labs    07/16/20 0330  CHOL 86  HDL 31*  LDLCALC 36  TRIG 93  CHOLHDL 2.8   Anemia Panel: Recent Labs    07/17/20 0041 07/18/20 0201  FERRITIN 269 283   Urine analysis:    Component Value Date/Time   COLORURINE YELLOW 07/15/2020 1939   APPEARANCEUR CLEAR 07/15/2020 1939   LABSPEC 1.045 (H) 07/15/2020 1939   PHURINE 6.0 07/15/2020 1939   GLUCOSEU NEGATIVE 07/15/2020 1939   HGBUR SMALL (A) 07/15/2020 1939   BILIRUBINUR NEGATIVE 07/15/2020 1939   KETONESUR NEGATIVE 07/15/2020 1939   PROTEINUR NEGATIVE 07/15/2020 1939   NITRITE NEGATIVE 07/15/2020 1939    LEUKOCYTESUR NEGATIVE 07/15/2020 1939   Sepsis Labs: Invalid input(s): PROCALCITONIN, LACTICIDVEN   Time coordinating discharge: 45 minutes  SIGNED:  Mercy Riding, MD  [facility] 07/18/2020, 2:26 PM  If 7PM-7AM, please contact night-coverage [email or web address]

## 2020-07-18 NOTE — Progress Notes (Signed)
ANTICOAGULATION CONSULT NOTE - Follow Up Consult  Pharmacy Consult for Warfarin Indication: afib   Allergies  Allergen Reactions   Codeine Nausea Only    Pill form of codeine causes nausea   Statins Other (See Comments)    Myalgia     Patient Measurements: Height: 5\' 2"  (157.5 cm) Weight: 69.3 kg (152 lb 12.5 oz) IBW/kg (Calculated) : 54.6   Vital Signs: Temp: 98.7 F (37.1 C) (07/08 0822) Temp Source: Oral (07/08 0822) BP: 135/99 (07/08 0822) Pulse Rate: 86 (07/08 0822)  Labs: Recent Labs    07/15/20 1750 07/15/20 1753 07/17/20 0041 07/18/20 0201  HGB 18.3* 19.4* 17.1* 17.2*  HCT 54.2* 57.0* 51.9 53.0*  PLT 142*  --  95* 114*  APTT 42*  --   --   --   LABPROT 20.3*  --  18.7* 21.2*  INR 1.7*  --  1.6* 1.8*  CREATININE 1.67* 1.70* 1.40* 1.21    Estimated Creatinine Clearance: 44.4 mL/min (by C-G formula based on SCr of 1.21 mg/dL).    Assessment: Anticoag: INR 1.8 today. Hgb 17.2. Plts 114. Ddimer 0.92. - PTA dose warfarin 2.5mg  po daily except 3.75mg  on Mondays, Wednesdays, Fridays. INR 1.7 on admit  Goal of Therapy:  INR 2-3 Monitor platelets by anticoagulation protocol: Yes   Plan:  Daily INR,  Warfarin 5mg  po x1 again today With subtherapeutic INR admit, I might recommend increasing home warfarin to 3.75mg  MWFSS, and 2.5mg  TTh   Aryelle Figg S. Alford Highland, PharmD, BCPS Clinical Staff Pharmacist [email or web address]  Alford Highland, [place] 07/18/2020,1:35 PM

## 2020-07-18 NOTE — Progress Notes (Signed)
Prescription changes made after AVS was printed. Called daughter to notify of the changes and daughter to give a total of 5mg  of warfarin tonight per MD. Patient's daughter has no questions at this time

## 2020-07-18 NOTE — Progress Notes (Signed)
Patient discharging home. Vital signs stable at time of discharge, as reflected in discharge summary. Discharge instructions given and verbal understanding returned by patient and daughter. Patient to follow up with PCP with a week. No questions at this time.

## 2020-07-24 DIAGNOSIS — R42 Dizziness and giddiness: Secondary | ICD-10-CM | POA: Diagnosis not present

## 2020-07-24 DIAGNOSIS — I4891 Unspecified atrial fibrillation: Secondary | ICD-10-CM | POA: Diagnosis not present

## 2020-07-24 DIAGNOSIS — Z681 Body mass index (BMI) 19 or less, adult: Secondary | ICD-10-CM | POA: Diagnosis not present

## 2020-07-24 DIAGNOSIS — I5022 Chronic systolic (congestive) heart failure: Secondary | ICD-10-CM | POA: Diagnosis not present

## 2020-07-25 DIAGNOSIS — R42 Dizziness and giddiness: Secondary | ICD-10-CM | POA: Diagnosis not present

## 2020-07-25 DIAGNOSIS — I4891 Unspecified atrial fibrillation: Secondary | ICD-10-CM | POA: Diagnosis not present

## 2020-07-25 DIAGNOSIS — I5022 Chronic systolic (congestive) heart failure: Secondary | ICD-10-CM | POA: Diagnosis not present

## 2020-07-25 DIAGNOSIS — R410 Disorientation, unspecified: Secondary | ICD-10-CM | POA: Diagnosis not present

## 2020-07-29 ENCOUNTER — Ambulatory Visit (INDEPENDENT_AMBULATORY_CARE_PROVIDER_SITE_OTHER): Payer: Medicare Other | Admitting: *Deleted

## 2020-07-29 DIAGNOSIS — I4891 Unspecified atrial fibrillation: Secondary | ICD-10-CM

## 2020-07-29 DIAGNOSIS — Z5181 Encounter for therapeutic drug level monitoring: Secondary | ICD-10-CM

## 2020-07-29 LAB — POCT INR: INR: 5.2 — AB (ref 2.0–3.0)

## 2020-07-29 NOTE — Patient Instructions (Signed)
S/P Covid 07/15/20  Took 3 days Remdesivir   Hold warfarin tomorrow and Thursday then resume 1 1/2 tablet daily except 1 tablet on Sundays, Tuesdays and Thursdays Recheck in 2 wks. Call [phone] for any questions or medication changes

## 2020-08-14 ENCOUNTER — Ambulatory Visit (INDEPENDENT_AMBULATORY_CARE_PROVIDER_SITE_OTHER): Payer: Medicare Other | Admitting: *Deleted

## 2020-08-14 DIAGNOSIS — I4891 Unspecified atrial fibrillation: Secondary | ICD-10-CM | POA: Diagnosis not present

## 2020-08-14 DIAGNOSIS — Z5181 Encounter for therapeutic drug level monitoring: Secondary | ICD-10-CM

## 2020-08-14 LAB — POCT INR: INR: 2.7 (ref 2.0–3.0)

## 2020-08-14 NOTE — Patient Instructions (Signed)
Continue warfarin 1 1/2 tablet daily except 1 tablet on Sundays, Tuesdays and Thursdays Recheck in 4 wks. Call [phone] for any questions or medication changes New RX sent to [facility]

## 2020-09-11 ENCOUNTER — Other Ambulatory Visit: Payer: Self-pay

## 2020-09-11 ENCOUNTER — Ambulatory Visit (INDEPENDENT_AMBULATORY_CARE_PROVIDER_SITE_OTHER): Payer: Medicare Other | Admitting: *Deleted

## 2020-09-11 DIAGNOSIS — I4891 Unspecified atrial fibrillation: Secondary | ICD-10-CM

## 2020-09-11 DIAGNOSIS — Z5181 Encounter for therapeutic drug level monitoring: Secondary | ICD-10-CM

## 2020-09-11 LAB — POCT INR: INR: 2.2 (ref 2.0–3.0)

## 2020-09-11 NOTE — Patient Instructions (Signed)
Continue warfarin 1 1/2 tablet daily except 1 tablet on Sundays, Tuesdays and Thursdays Recheck in 4 wks. Call [phone] for any questions or medication changes New RX sent to [facility]

## 2020-09-26 ENCOUNTER — Other Ambulatory Visit: Payer: Self-pay | Admitting: Pulmonary Disease

## 2020-10-09 ENCOUNTER — Ambulatory Visit (INDEPENDENT_AMBULATORY_CARE_PROVIDER_SITE_OTHER): Payer: Medicare Other | Admitting: *Deleted

## 2020-10-09 DIAGNOSIS — Z5181 Encounter for therapeutic drug level monitoring: Secondary | ICD-10-CM | POA: Diagnosis not present

## 2020-10-09 DIAGNOSIS — I4891 Unspecified atrial fibrillation: Secondary | ICD-10-CM

## 2020-10-09 DIAGNOSIS — I639 Cerebral infarction, unspecified: Secondary | ICD-10-CM | POA: Diagnosis not present

## 2020-10-09 LAB — POCT INR: INR: 2.8 (ref 2.0–3.0)

## 2020-10-09 NOTE — Patient Instructions (Signed)
Continue warfarin 1 1/2 tablet daily except 1 tablet on Sundays, Tuesdays and Thursdays Recheck in 6 wks. Call [phone] for any questions or medication changes New RX sent to [facility]

## 2020-11-04 DIAGNOSIS — Z23 Encounter for immunization: Secondary | ICD-10-CM | POA: Diagnosis not present

## 2020-11-20 ENCOUNTER — Other Ambulatory Visit: Payer: Self-pay

## 2020-11-20 ENCOUNTER — Ambulatory Visit (INDEPENDENT_AMBULATORY_CARE_PROVIDER_SITE_OTHER): Payer: Medicare Other | Admitting: *Deleted

## 2020-11-20 DIAGNOSIS — Z5181 Encounter for therapeutic drug level monitoring: Secondary | ICD-10-CM | POA: Diagnosis not present

## 2020-11-20 DIAGNOSIS — I4891 Unspecified atrial fibrillation: Secondary | ICD-10-CM | POA: Diagnosis not present

## 2020-11-20 LAB — POCT INR: INR: 1.8 — AB (ref 2.0–3.0)

## 2020-11-20 NOTE — Patient Instructions (Signed)
Take warfarin 2 tablets tonight then resume 1 1/2 tablet daily except 1 tablet on Sundays, Tuesdays and Thursdays Recheck in 3 wks. Call [phone] for any questions or medication changes New RX sent to [facility]

## 2020-12-11 ENCOUNTER — Ambulatory Visit (INDEPENDENT_AMBULATORY_CARE_PROVIDER_SITE_OTHER): Payer: Medicare Other | Admitting: *Deleted

## 2020-12-11 DIAGNOSIS — Z5181 Encounter for therapeutic drug level monitoring: Secondary | ICD-10-CM | POA: Diagnosis not present

## 2020-12-11 DIAGNOSIS — I4891 Unspecified atrial fibrillation: Secondary | ICD-10-CM

## 2020-12-11 LAB — POCT INR: INR: 2.1 (ref 2.0–3.0)

## 2020-12-11 NOTE — Patient Instructions (Signed)
Continue warfarin 1 1/2 tablet daily except 1 tablet on Sundays, Tuesdays and Thursdays Recheck in 4 wks. Call [phone] for any questions or medication changes

## 2021-01-08 ENCOUNTER — Other Ambulatory Visit: Payer: Self-pay

## 2021-01-08 ENCOUNTER — Ambulatory Visit (INDEPENDENT_AMBULATORY_CARE_PROVIDER_SITE_OTHER): Payer: Medicare Other | Admitting: *Deleted

## 2021-01-08 DIAGNOSIS — Z5181 Encounter for therapeutic drug level monitoring: Secondary | ICD-10-CM | POA: Diagnosis not present

## 2021-01-08 DIAGNOSIS — I4891 Unspecified atrial fibrillation: Secondary | ICD-10-CM | POA: Diagnosis not present

## 2021-01-08 LAB — POCT INR: INR: 2.8 (ref 2.0–3.0)

## 2021-01-08 MED ORDER — WARFARIN SODIUM 2.5 MG PO TABS: ORAL_TABLET | ORAL | 6 refills | Status: DC

## 2021-01-08 NOTE — Patient Instructions (Signed)
Continue warfarin 1 1/2 tablet daily except 1 tablet on Sundays, Tuesdays and Thursdays Recheck in 6 wks. Call [phone] for any questions or medication changes

## 2021-01-26 DIAGNOSIS — S0342XA Sprain of jaw, left side, initial encounter: Secondary | ICD-10-CM | POA: Diagnosis not present

## 2021-01-26 DIAGNOSIS — H9202 Otalgia, left ear: Secondary | ICD-10-CM | POA: Diagnosis not present

## 2021-01-26 DIAGNOSIS — K068 Other specified disorders of gingiva and edentulous alveolar ridge: Secondary | ICD-10-CM | POA: Diagnosis not present

## 2021-02-10 DIAGNOSIS — I4891 Unspecified atrial fibrillation: Secondary | ICD-10-CM | POA: Diagnosis not present

## 2021-02-10 DIAGNOSIS — I5022 Chronic systolic (congestive) heart failure: Secondary | ICD-10-CM | POA: Diagnosis not present

## 2021-02-10 DIAGNOSIS — I110 Hypertensive heart disease with heart failure: Secondary | ICD-10-CM | POA: Diagnosis not present

## 2021-02-10 DIAGNOSIS — E7849 Other hyperlipidemia: Secondary | ICD-10-CM | POA: Diagnosis not present

## 2021-02-19 ENCOUNTER — Ambulatory Visit (INDEPENDENT_AMBULATORY_CARE_PROVIDER_SITE_OTHER): Payer: Medicare Other | Admitting: *Deleted

## 2021-02-19 DIAGNOSIS — Z5181 Encounter for therapeutic drug level monitoring: Secondary | ICD-10-CM | POA: Diagnosis not present

## 2021-02-19 DIAGNOSIS — I4891 Unspecified atrial fibrillation: Secondary | ICD-10-CM | POA: Diagnosis not present

## 2021-02-19 LAB — POCT INR: INR: 2.9 (ref 2.0–3.0)

## 2021-02-19 NOTE — Patient Instructions (Signed)
Description   Continue warfarin 1 1/2 tablet daily except 1 tablet on Sundays, Tuesdays and Thursdays Recheck in 6 wks. Call [phone] if you are started on any new medications or have any concerns.

## 2021-02-25 ENCOUNTER — Emergency Department (HOSPITAL_COMMUNITY): Payer: Medicare Other

## 2021-02-25 ENCOUNTER — Encounter (HOSPITAL_COMMUNITY): Payer: Self-pay

## 2021-02-25 ENCOUNTER — Emergency Department (HOSPITAL_COMMUNITY)
Admission: EM | Admit: 2021-02-25 | Discharge: 2021-02-25 | Disposition: A | Discharge status: Home / Self Care | Payer: Medicare Other | Attending: Emergency Medicine | Admitting: Emergency Medicine

## 2021-02-25 DIAGNOSIS — Z7901 Long term (current) use of anticoagulants: Secondary | ICD-10-CM | POA: Insufficient documentation

## 2021-02-25 DIAGNOSIS — I4891 Unspecified atrial fibrillation: Secondary | ICD-10-CM | POA: Diagnosis not present

## 2021-02-25 DIAGNOSIS — J019 Acute sinusitis, unspecified: Secondary | ICD-10-CM | POA: Insufficient documentation

## 2021-02-25 DIAGNOSIS — I509 Heart failure, unspecified: Secondary | ICD-10-CM | POA: Diagnosis not present

## 2021-02-25 DIAGNOSIS — R55 Syncope and collapse: Secondary | ICD-10-CM | POA: Diagnosis not present

## 2021-02-25 DIAGNOSIS — R059 Cough, unspecified: Secondary | ICD-10-CM | POA: Diagnosis not present

## 2021-02-25 DIAGNOSIS — D709 Neutropenia, unspecified: Secondary | ICD-10-CM

## 2021-02-25 DIAGNOSIS — R0602 Shortness of breath: Secondary | ICD-10-CM | POA: Diagnosis not present

## 2021-02-25 DIAGNOSIS — J441 Chronic obstructive pulmonary disease with (acute) exacerbation: Secondary | ICD-10-CM | POA: Diagnosis not present

## 2021-02-25 DIAGNOSIS — I959 Hypotension, unspecified: Secondary | ICD-10-CM | POA: Diagnosis not present

## 2021-02-25 DIAGNOSIS — J9811 Atelectasis: Secondary | ICD-10-CM | POA: Diagnosis not present

## 2021-02-25 LAB — CBC WITH DIFFERENTIAL/PLATELET
Basophils Absolute: 0 10*3/uL (ref 0.0–0.1)
Basophils Relative: 0 %
Eosinophils Absolute: 0 10*3/uL (ref 0.0–0.5)
Eosinophils Relative: 0 %
HCT: 31.8 % — ABNORMAL LOW (ref 39.0–52.0)
Hemoglobin: 10.6 g/dL — ABNORMAL LOW (ref 13.0–17.0)
Lymphocytes Relative: 39 %
Lymphs Abs: 4.4 10*3/uL — ABNORMAL HIGH (ref 0.7–4.0)
MCH: 34.6 pg — ABNORMAL HIGH (ref 26.0–34.0)
MCHC: 33.3 g/dL (ref 30.0–36.0)
MCV: 103.9 fL — ABNORMAL HIGH (ref 80.0–100.0)
Monocytes Absolute: 6.5 10*3/uL — ABNORMAL HIGH (ref 0.1–1.0)
Monocytes Relative: 58 %
Neutro Abs: 0.3 10*3/uL — CL (ref 1.7–7.7)
Neutrophils Relative %: 3 %
Platelets: 134 10*3/uL — ABNORMAL LOW (ref 150–400)
RBC: 3.06 MIL/uL — ABNORMAL LOW (ref 4.22–5.81)
RDW: 18.6 % — ABNORMAL HIGH (ref 11.5–15.5)
WBC: 11.2 10*3/uL — ABNORMAL HIGH (ref 4.0–10.5)
nRBC: 0 % (ref 0.0–0.2)

## 2021-02-25 LAB — RETICULOCYTES
Immature Retic Fract: 19.4 % — ABNORMAL HIGH (ref 2.3–15.9)
RBC.: 2.79 MIL/uL — ABNORMAL LOW (ref 4.22–5.81)
Retic Count, Absolute: 28.5 10*3/uL (ref 19.0–186.0)
Retic Ct Pct: 1 % (ref 0.4–3.1)

## 2021-02-25 LAB — COMPREHENSIVE METABOLIC PANEL
ALT: 11 U/L (ref 0–44)
AST: 10 U/L — ABNORMAL LOW (ref 15–41)
Albumin: 3.2 g/dL — ABNORMAL LOW (ref 3.5–5.0)
Alkaline Phosphatase: 48 U/L (ref 38–126)
Anion gap: 11 (ref 5–15)
BUN: 30 mg/dL — ABNORMAL HIGH (ref 8–23)
CO2: 21 mmol/L — ABNORMAL LOW (ref 22–32)
Calcium: 7.8 mg/dL — ABNORMAL LOW (ref 8.9–10.3)
Chloride: 102 mmol/L (ref 98–111)
Creatinine, Ser: 1.53 mg/dL — ABNORMAL HIGH (ref 0.61–1.24)
GFR, Estimated: 47 mL/min — ABNORMAL LOW (ref >60)
Glucose, Bld: 120 mg/dL — ABNORMAL HIGH (ref 70–99)
Potassium: 3 mmol/L — ABNORMAL LOW (ref 3.5–5.1)
Sodium: 134 mmol/L — ABNORMAL LOW (ref 135–145)
Total Bilirubin: 1.1 mg/dL (ref 0.3–1.2)
Total Protein: 7 g/dL (ref 6.5–8.1)

## 2021-02-25 LAB — FOLATE: Folate: 7.7 ng/mL (ref >5.9)

## 2021-02-25 LAB — PROTIME-INR
INR: 1.8 — ABNORMAL HIGH (ref 0.8–1.2)
Prothrombin Time: 20.9 seconds — ABNORMAL HIGH (ref 11.4–15.2)

## 2021-02-25 LAB — LACTATE DEHYDROGENASE: LDH: 106 U/L (ref 98–192)

## 2021-02-25 LAB — POC OCCULT BLOOD, ED: Fecal Occult Bld: NEGATIVE

## 2021-02-25 LAB — VITAMIN B12: Vitamin B-12: 112 pg/mL — ABNORMAL LOW (ref 180–914)

## 2021-02-25 LAB — FLOW CYTOMETRY

## 2021-02-25 MED ORDER — FLUTICASONE PROPIONATE 50 MCG/ACT NA SUSP
2.0000 | Freq: Every day | NASAL | 0 refills | Status: AC
Start: 2021-02-25 — End: ?

## 2021-02-25 MED ORDER — POTASSIUM CHLORIDE CRYS ER 20 MEQ PO TBCR
40.0000 meq | EXTENDED_RELEASE_TABLET | Freq: Once | ORAL | Status: AC
  Administered 2021-02-25: 40 meq via ORAL
  Filled 2021-02-25: qty 2

## 2021-02-25 MED ORDER — CLINDAMYCIN HCL 300 MG PO CAPS: 300.0000 mg | ORAL_CAPSULE | Freq: Four times a day (QID) | ORAL | 0 refills | Status: DC

## 2021-02-25 MED ORDER — POTASSIUM CHLORIDE CRYS ER 20 MEQ PO TBCR: 20.0000 meq | EXTENDED_RELEASE_TABLET | Freq: Two times a day (BID) | ORAL | 0 refills | Status: DC

## 2021-02-25 MED ORDER — IPRATROPIUM-ALBUTEROL 0.5-2.5 (3) MG/3ML IN SOLN
RESPIRATORY_TRACT | Status: AC
  Filled 2021-02-25: qty 3

## 2021-02-25 MED ORDER — IPRATROPIUM-ALBUTEROL 0.5-2.5 (3) MG/3ML IN SOLN
3.0000 mL | Freq: Once | RESPIRATORY_TRACT | Status: AC
  Administered 2021-02-25: 3 mL via RESPIRATORY_TRACT
  Filled 2021-02-25: qty 3

## 2021-02-25 MED ORDER — SODIUM CHLORIDE 0.9 % IV BOLUS
500.0000 mL | Freq: Once | INTRAVENOUS | Status: AC
  Administered 2021-02-25: 500 mL via INTRAVENOUS

## 2021-02-25 NOTE — ED Notes (Signed)
Pt ambulated to bathroom 

## 2021-02-25 NOTE — Discharge Instructions (Addendum)
A specific white blood cell component is low, this is called "neutropenia".  This can make you more susceptible to infection.  Due to this you are being prescribed antibiotics for the sinus symptoms.  Dr. Delton Coombes or his office will call you for an appointment, likely this week.  If you develop fever, vomiting, chills, trouble breathing, or any other new/concerning symptoms then return to the ER for evaluation.

## 2021-02-25 NOTE — ED Triage Notes (Signed)
Pt's daughter states pt has been sick since December with congestion and coughing. States he has been treated twice by PCP but pt isn't getting any better.

## 2021-02-25 NOTE — ED Provider Notes (Signed)
[facility] EMERGENCY DEPARTMENT Provider Note   CSN: [id number] Arrival date & time: 02/25/21  0813     History  Chief Complaint  Patient presents with   Shortness of Breath    Vincent Carter is a 76 y.o. male.  HPI 76 year old male presents with shortness of breath.  History is from the patient as well as the daughter at the bedside.  Patient has been having sinus problems since Christmas time.  He was on an antibiotic in January that he had allergic to and got a second 1 that he also was allergic to but he does not know the name of either.  He has not been having a fever.  Has been having bloody drainage when he blows his nose.  A little bit of a sore throat.  He has been having some on and off cough and shortness of breath during that time.  Seems like the shortness of breath is slowly and progressively worsening.  Went back to his PCP, Dr. Gerarda Fraction, who sent him here.  I discussed with his PCP prior to the patient coming and he indicated that his blood pressure was soft in his office.  Daughter states patient has been noting decreased urine output and his blood pressure was in the 80s when he was at the PCP.  Home Medications Prior to Admission medications   Medication Sig Start Date End Date Taking? Authorizing Provider  clindamycin (CLEOCIN) 300 MG capsule Take 1 capsule (300 mg total) by mouth 4 (four) times daily. X 7 days 02/25/21  Yes Sherwood Gambler, MD  fluticasone [facility]) 50 MCG/ACT nasal spray Place 2 sprays into both nostrils daily. 02/25/21  Yes Sherwood Gambler, MD  potassium chloride SA (KLOR-CON M) 20 MEQ tablet Take 1 tablet (20 mEq total) by mouth 2 (two) times daily for 3 days. 02/25/21 02/28/21 Yes Sherwood Gambler, MD  carvedilol (COREG) 12.5 MG tablet Take 1 tablet (12.5 mg total) by mouth 2 (two) times daily with a meal. 07/18/20   Mercy Riding, MD  dextromethorphan-guaiFENesin (MUCINEX DM) 30-600 MG 12hr tablet Take 1 tablet by mouth 2 (two) times daily. Patient  taking differently: Take 1 tablet by mouth 2 (two) times daily as needed. 03/30/18   Kathie Dike, MD  furosemide (LASIX) 20 MG tablet Take 1 tablet (20 mg total) by mouth daily. 07/22/20 07/22/21  Mercy Riding, MD  ipratropium-albuterol (DUONEB) 0.5-2.5 (3) MG/3ML SOLN Take 3 mLs by nebulization every 6 (six) hours as needed. 06/27/20   Chesley Mires, MD  lisinopril (ZESTRIL) 2.5 MG tablet Take 1 tablet (2.5 mg total) by mouth daily. 07/22/20   Mercy Riding, MD  montelukast (SINGULAIR) 10 MG tablet TAKE 1 TABLET BY MOUTH AT BEDTIME 09/28/20   Chesley Mires, MD  Nebulizer MISC Please provide 1 nebulizer machine and all supplies. Diagnosis: J44.1, Office number: [phone] 03/30/18   Kathie Dike, MD  nitroGLYCERIN (NITROSTAT) 0.4 MG SL tablet PLACE 1 TABLET UNDER TONGUE EVERY 5 MINUTES AS NEEDED FOR CHEST PAIN. 03/07/20   Arnoldo Lenis, MD  Omega-3 Fatty Acids (FISH OIL) 300 MG CAPS Take 2 capsules by mouth 2 (two) times daily.    [provider]  warfarin (COUMADIN) 2.5 MG tablet Take 1.5 tablets (3.75 mg total) every Monday, Wednesday, Friday, and Saturday, and 1 tablet (2.5 mg) on other days 01/08/21   Arnoldo Lenis, MD      Allergies    Augmentin [amoxicillin-pot clavulanate], Codeine, and Statins    Review of  Systems   Review of Systems  Constitutional:  Negative for fever.  HENT:  Positive for congestion, sinus pressure and sinus pain.   Respiratory:  Positive for cough and shortness of breath.   Cardiovascular:  Negative for chest pain and leg swelling.   Physical Exam Updated Vital Signs BP (!) 110/56    Pulse 64    Temp 98 F (36.7 C) (Oral)    Resp (!) 27    Ht 5' (1.524 m)    Wt 65.8 kg    SpO2 96%    BMI 28.32 kg/m  Physical Exam Vitals and nursing note reviewed.  Constitutional:      Appearance: He is well-developed.  HENT:     Head: Normocephalic and atraumatic.     Nose:     Comments: Yellow crusting and dryness to bilateral inner  nares Cardiovascular:     Rate and Rhythm: Normal rate and regular rhythm.     Heart sounds: Normal heart sounds.  Pulmonary:     Effort: Pulmonary effort is normal. No accessory muscle usage.     Breath sounds: Wheezing present.  Abdominal:     Palpations: Abdomen is soft.     Tenderness: There is no abdominal tenderness.  Musculoskeletal:     Right lower leg: No edema.     Left lower leg: No edema.  Skin:    General: Skin is warm and dry.  Neurological:     Mental Status: He is alert.    ED Results / Procedures / Treatments   Labs (all labs ordered are listed, but only abnormal results are displayed) Labs Reviewed  COMPREHENSIVE METABOLIC PANEL - Abnormal; Notable for the following components:      Result Value   Sodium 134 (*)    Potassium 3.0 (*)    CO2 21 (*)    Glucose, Bld 120 (*)    BUN 30 (*)    Creatinine, Ser 1.53 (*)    Calcium 7.8 (*)    Albumin 3.2 (*)    AST 10 (*)    GFR, Estimated 47 (*)    All other components within normal limits  CBC WITH DIFFERENTIAL/PLATELET - Abnormal; Notable for the following components:   WBC 11.2 (*)    RBC 3.06 (*)    Hemoglobin 10.6 (*)    HCT 31.8 (*)    MCV 103.9 (*)    MCH 34.6 (*)    RDW 18.6 (*)    Platelets 134 (*)    Neutro Abs 0.3 (*)    Lymphs Abs 4.4 (*)    Monocytes Absolute 6.5 (*)    All other components within normal limits  PROTIME-INR - Abnormal; Notable for the following components:   Prothrombin Time 20.9 (*)    INR 1.8 (*)    All other components within normal limits  VITAMIN B12 - Abnormal; Notable for the following components:   Vitamin B-12 112 (*)    All other components within normal limits  RETICULOCYTES - Abnormal; Notable for the following components:   RBC. 2.79 (*)    Immature Retic Fract 19.4 (*)    All other components within normal limits  CULTURE, BLOOD (ROUTINE X 2)  CULTURE, BLOOD (ROUTINE X 2)  FOLATE  LACTATE DEHYDROGENASE  FLOW CYTOMETRY  PATHOLOGIST SMEAR REVIEW   METHYLMALONIC ACID, SERUM  COPPER, SERUM  POC OCCULT BLOOD, ED    EKG EKG Interpretation  Date/Time:  Wednesday February 25 2021 08:23:48 EST Ventricular Rate:  59 PR Interval:  QRS Duration: 92 QT Interval:  419 QTC Calculation: 415 R Axis:   89 Text Interpretation: sinus rhythm with arrhythmia Borderline right axis deviation Low voltage, precordial leads Borderline repolarization abnormality aflutter not present when compared to July 2022 Confirmed by Sherwood Gambler [phone]) on 02/25/2021 8:47:32 AM  Radiology DG Chest Port 1 View  Result Date: 02/25/2021 CLINICAL DATA:  Shortness of breath and cough since December. EXAM: PORTABLE CHEST 1 VIEW COMPARISON:  07/16/2020 FINDINGS: Previous median sternotomy and CABG procedure. Stable cardiomediastinal contours. Unchanged appearance of left midlung scarring. Mild subsegmental atelectasis and volume loss within the right lung base is increased from previous exam. No signs of pleural effusion or interstitial edema. IMPRESSION: 1. Increase in subsegmental atelectasis and volume loss within the right lung base. 2. Stable left midlung scarring. Electronically Signed   By: Kerby Moors M.D.   On: 02/25/2021 09:38    Procedures Procedures    Medications Ordered in ED Medications  ipratropium-albuterol (DUONEB) 0.5-2.5 (3) MG/3ML nebulizer solution (  Not Given 02/25/21 0855)  ipratropium-albuterol (DUONEB) 0.5-2.5 (3) MG/3ML nebulizer solution 3 mL (3 mLs Nebulization Given 02/25/21 0849)  sodium chloride 0.9 % bolus 500 mL (0 mLs Intravenous Stopped 02/25/21 0951)  potassium chloride SA (KLOR-CON M) CR tablet 40 mEq (40 mEq Oral Given 02/25/21 6195)    ED Course/ Medical Decision Making/ A&P                           Medical Decision Making Amount and/or Complexity of Data Reviewed Labs: ordered. Radiology: ordered.  Risk Prescription drug management.   Patient has some soft blood pressures here but technically normal besides of  1 low blood pressure.  He was given a small bolus of fluids.  He was slightly wheezy on arrival so he was given albuterol neb and feels like this has improved his mild dyspnea.  Chest x-ray images reviewed by myself and show no obvious pneumonia, may be some questionable increased atelectasis.  ECG interpreted by myself and shows no acute ischemia and while it is a difficult baseline it does appear that he has a sinus rhythm.  However the labs are concerning for neutropenia.  He is not on chemotherapy and does not have an obvious active cancer so this is an odd presentation for this.  He has not been febrile recently.  Thus I discussed this case with hematology/oncology on-call, Dr. Delton Coombes.  He has asked for extra labs which have been ordered.  He is okay seeing the patient this week as an outpatient.  Patient's lab work shows anemia that is new but last lab work was over half a year ago so is unclear how acute this is.  However there is no black or bloody stools and his Hemoccult testing here was negative so I doubt GI bleed.  Could be a part of what ever is going on with his neutropenia.  Thrombocytopenia is chronic.   Given his neutropenia I think it be reasonable to use antibiotics for his sinus infection and questionable pneumonia.  I called [employer], he received Augmentin last month and seem to have an intolerance. Because of warfarin, can't use doxycycline or fluoroquinolones. Can't use cefixime or cefpodoxime. Thus will use clinda and have him follow back up with his PCP.  I do not think he needs to be admitted at this time.  He was given return precautions.        Final Clinical Impression(s) / ED  Diagnoses Final diagnoses:  Neutropenia, unspecified type ([place])  Subacute sinusitis, unspecified location    Rx / DC Orders ED Discharge Orders          Ordered    potassium chloride SA (KLOR-CON M) 20 MEQ tablet  2 times daily        02/25/21 1139    fluticasone (FLONASE) 50  MCG/ACT nasal spray  Daily        02/25/21 1139    clindamycin (CLEOCIN) 300 MG capsule  4 times daily        02/25/21 [place], Kamoni Gentles, MD 02/25/21 1506

## 2021-02-26 LAB — PATHOLOGIST SMEAR REVIEW

## 2021-02-26 LAB — SURGICAL PATHOLOGY

## 2021-02-27 LAB — COPPER, SERUM: Copper: 114 ug/dL (ref 69–132)

## 2021-02-28 ENCOUNTER — Encounter (HOSPITAL_COMMUNITY): Payer: Self-pay | Admitting: Emergency Medicine

## 2021-02-28 ENCOUNTER — Emergency Department (HOSPITAL_COMMUNITY)
Admission: EM | Admit: 2021-02-28 | Discharge: 2021-03-01 | Disposition: A | Discharge status: Home / Self Care | Payer: Medicare Other | Attending: Emergency Medicine | Admitting: Emergency Medicine

## 2021-02-28 ENCOUNTER — Emergency Department (HOSPITAL_COMMUNITY): Payer: Medicare Other

## 2021-02-28 DIAGNOSIS — D709 Neutropenia, unspecified: Secondary | ICD-10-CM | POA: Insufficient documentation

## 2021-02-28 DIAGNOSIS — Z79899 Other long term (current) drug therapy: Secondary | ICD-10-CM | POA: Insufficient documentation

## 2021-02-28 DIAGNOSIS — Z7951 Long term (current) use of inhaled steroids: Secondary | ICD-10-CM | POA: Diagnosis not present

## 2021-02-28 DIAGNOSIS — R079 Chest pain, unspecified: Secondary | ICD-10-CM | POA: Diagnosis not present

## 2021-02-28 DIAGNOSIS — J449 Chronic obstructive pulmonary disease, unspecified: Secondary | ICD-10-CM | POA: Diagnosis not present

## 2021-02-28 DIAGNOSIS — R0602 Shortness of breath: Secondary | ICD-10-CM | POA: Diagnosis not present

## 2021-02-28 DIAGNOSIS — Z20822 Contact with and (suspected) exposure to covid-19: Secondary | ICD-10-CM | POA: Diagnosis not present

## 2021-02-28 DIAGNOSIS — R0789 Other chest pain: Secondary | ICD-10-CM | POA: Diagnosis not present

## 2021-02-28 DIAGNOSIS — Z7901 Long term (current) use of anticoagulants: Secondary | ICD-10-CM | POA: Diagnosis not present

## 2021-02-28 LAB — CBC WITH DIFFERENTIAL/PLATELET
Abs Immature Granulocytes: 0.06 10*3/uL (ref 0.00–0.07)
Basophils Absolute: 0 10*3/uL (ref 0.0–0.1)
Basophils Relative: 0 %
Eosinophils Absolute: 0.1 10*3/uL (ref 0.0–0.5)
Eosinophils Relative: 0 %
HCT: 29.8 % — ABNORMAL LOW (ref 39.0–52.0)
Hemoglobin: 9.8 g/dL — ABNORMAL LOW (ref 13.0–17.0)
Immature Granulocytes: 1 %
Lymphocytes Relative: 32 %
Lymphs Abs: 3.8 10*3/uL (ref 0.7–4.0)
MCH: 34.8 pg — ABNORMAL HIGH (ref 26.0–34.0)
MCHC: 32.9 g/dL (ref 30.0–36.0)
MCV: 105.7 fL — ABNORMAL HIGH (ref 80.0–100.0)
Monocytes Absolute: 7.5 10*3/uL — ABNORMAL HIGH (ref 0.1–1.0)
Monocytes Relative: 64 %
Neutro Abs: 0.4 10*3/uL — CL (ref 1.7–7.7)
Neutrophils Relative %: 3 %
Platelets: 135 10*3/uL — ABNORMAL LOW (ref 150–400)
RBC: 2.82 MIL/uL — ABNORMAL LOW (ref 4.22–5.81)
RDW: 18.9 % — ABNORMAL HIGH (ref 11.5–15.5)
WBC: 11.8 10*3/uL — ABNORMAL HIGH (ref 4.0–10.5)
nRBC: 0 % (ref 0.0–0.2)

## 2021-02-28 LAB — COMPREHENSIVE METABOLIC PANEL WITH GFR
ALT: 13 U/L (ref 0–44)
AST: 11 U/L — ABNORMAL LOW (ref 15–41)
Albumin: 2.8 g/dL — ABNORMAL LOW (ref 3.5–5.0)
Alkaline Phosphatase: 50 U/L (ref 38–126)
Anion gap: 5 (ref 5–15)
BUN: 26 mg/dL — ABNORMAL HIGH (ref 8–23)
CO2: 22 mmol/L (ref 22–32)
Calcium: 8 mg/dL — ABNORMAL LOW (ref 8.9–10.3)
Chloride: 108 mmol/L (ref 98–111)
Creatinine, Ser: 1.38 mg/dL — ABNORMAL HIGH (ref 0.61–1.24)
GFR, Estimated: 53 mL/min — ABNORMAL LOW
Glucose, Bld: 119 mg/dL — ABNORMAL HIGH (ref 70–99)
Potassium: 4.1 mmol/L (ref 3.5–5.1)
Sodium: 135 mmol/L (ref 135–145)
Total Bilirubin: 0.8 mg/dL (ref 0.3–1.2)
Total Protein: 6.9 g/dL (ref 6.5–8.1)

## 2021-02-28 LAB — TROPONIN I (HIGH SENSITIVITY): Troponin I (High Sensitivity): 6 ng/L

## 2021-02-28 LAB — RESP PANEL BY RT-PCR (FLU A&B, COVID) ARPGX2
Influenza A by PCR: NEGATIVE
Influenza B by PCR: NEGATIVE
SARS Coronavirus 2 by RT PCR: NEGATIVE

## 2021-02-28 MED ORDER — PREDNISONE 50 MG PO TABS
60.0000 mg | ORAL_TABLET | Freq: Once | ORAL | Status: AC
  Administered 2021-02-28: 60 mg via ORAL
  Filled 2021-02-28: qty 1

## 2021-02-28 MED ORDER — PREDNISONE 10 MG PO TABS: ORAL_TABLET | ORAL | 0 refills | Status: DC

## 2021-02-28 NOTE — ED Provider Notes (Signed)
[facility] EMERGENCY DEPARTMENT Provider Note   CSN: [id number] Arrival date & time: 02/28/21  2154     History  Chief Complaint  Patient presents with   Chest Pain    Vincent Carter is a 76 y.o. male.  HPI He complains of chest pain, left anterior that started early this morning and has been "sore"  since then.  Earlier this evening, he took some nitroglycerin which seemed to help some but did not eliminate the discomfort.  He has a remote history of cardiac disease.  He was seen in the ED, 3 days ago and diagnosed with a sinus infection and placed on clindamycin.  He states he is taking that.  He is not coughing.  He feels short of breath today.  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  He has a scheduled follow-up appointment on 03/03/2021.  He has not taken nitroglycerin recently prior to tonight.    Home Medications Prior to Admission medications   Medication Sig Start Date End Date Taking? Authorizing Provider  predniSONE (DELTASONE) 10 MG tablet Take q day 6,5,4,3,2,1 02/28/21  Yes Daleen Bo, MD  carvedilol (COREG) 12.5 MG tablet Take 1 tablet (12.5 mg total) by mouth 2 (two) times daily with a meal. 07/18/20   Mercy Riding, MD  clindamycin (CLEOCIN) 300 MG capsule Take 1 capsule (300 mg total) by mouth 4 (four) times daily. X 7 days 02/25/21   Sherwood Gambler, MD  dextromethorphan-guaiFENesin [facility] DM) 30-600 MG 12hr tablet Take 1 tablet by mouth 2 (two) times daily. Patient taking differently: Take 1 tablet by mouth 2 (two) times daily as needed. 03/30/18   Kathie Dike, MD  fluticasone (FLONASE) 50 MCG/ACT nasal spray Place 2 sprays into both nostrils daily. 02/25/21   Sherwood Gambler, MD  furosemide (LASIX) 20 MG tablet Take 1 tablet (20 mg total) by mouth daily. 07/22/20 07/22/21  Mercy Riding, MD  ipratropium-albuterol (DUONEB) 0.5-2.5 (3) MG/3ML SOLN Take 3 mLs by nebulization every 6  (six) hours as needed. 06/27/20   Chesley Mires, MD  lisinopril (ZESTRIL) 2.5 MG tablet Take 1 tablet (2.5 mg total) by mouth daily. 07/22/20   Mercy Riding, MD  montelukast (SINGULAIR) 10 MG tablet TAKE 1 TABLET BY MOUTH AT BEDTIME 09/28/20   Chesley Mires, MD  Nebulizer MISC Please provide 1 nebulizer machine and all supplies. Diagnosis: J44.1, Office number: [phone] 03/30/18   Kathie Dike, MD  nitroGLYCERIN (NITROSTAT) 0.4 MG SL tablet PLACE 1 TABLET UNDER TONGUE EVERY 5 MINUTES AS NEEDED FOR CHEST PAIN. 03/07/20   Arnoldo Lenis, MD  Omega-3 Fatty Acids (FISH OIL) 300 MG CAPS Take 2 capsules by mouth 2 (two) times daily.    [provider]  potassium chloride SA (KLOR-CON M) 20 MEQ tablet Take 1 tablet (20 mEq total) by mouth 2 (two) times daily for 3 days. 02/25/21 02/28/21  Sherwood Gambler, MD  warfarin (COUMADIN) 2.5 MG tablet Take 1.5 tablets (3.75 mg total) every Monday, Wednesday, Friday, and Saturday, and 1 tablet (2.5 mg) on other days 01/08/21   Arnoldo Lenis, MD      Allergies    Augmentin [amoxicillin-pot clavulanate], Codeine, and Statins    Review of Systems   Review of Systems  Physical Exam Updated Vital Signs BP 114/60    Pulse 62    Resp 19    Ht 5' (1.524 m)    Wt  65.8 kg    SpO2 100%    BMI 28.32 kg/m  Physical Exam Vitals and nursing note reviewed.  Constitutional:      General: He is not in acute distress.    Appearance: He is well-developed. He is not ill-appearing, toxic-appearing or diaphoretic.  HENT:     Head: Normocephalic and atraumatic.     Right Ear: External ear normal.     Left Ear: External ear normal.  Eyes:     Conjunctiva/sclera: Conjunctivae normal.     Pupils: Pupils are equal, round, and reactive to light.  Neck:     Trachea: Phonation normal.  Cardiovascular:     Rate and Rhythm: Normal rate and regular rhythm.     Heart sounds: Normal heart sounds.  Pulmonary:     Effort: Pulmonary effort is normal. No respiratory  distress.     Breath sounds: No stridor.     Comments: Decreased breath sounds left mid and lower lung consistent with consolidation versus pleural effusion.  No respiratory distress.  He is mildly tachypneic Abdominal:     Palpations: Abdomen is soft.     Tenderness: There is no abdominal tenderness.  Musculoskeletal:        General: Normal range of motion.     Cervical back: Normal range of motion and neck supple.  Skin:    General: Skin is warm and dry.  Neurological:     Mental Status: He is alert and oriented to person, place, and time.     Cranial Nerves: No cranial nerve deficit.     Sensory: No sensory deficit.     Motor: No abnormal muscle tone.     Coordination: Coordination normal.  Psychiatric:        Behavior: Behavior normal.        Thought Content: Thought content normal.        Judgment: Judgment normal.    ED Results / Procedures / Treatments   Labs (all labs ordered are listed, but only abnormal results are displayed) Labs Reviewed  CBC WITH DIFFERENTIAL/PLATELET - Abnormal; Notable for the following components:      Result Value   WBC 11.8 (*)    RBC 2.82 (*)    Hemoglobin 9.8 (*)    HCT 29.8 (*)    MCV 105.7 (*)    MCH 34.8 (*)    RDW 18.9 (*)    Platelets 135 (*)    Neutro Abs 0.4 (*)    Monocytes Absolute 7.5 (*)    All other components within normal limits  COMPREHENSIVE METABOLIC PANEL - Abnormal; Notable for the following components:   Glucose, Bld 119 (*)    BUN 26 (*)    Creatinine, Ser 1.38 (*)    Calcium 8.0 (*)    Albumin 2.8 (*)    AST 11 (*)    GFR, Estimated 53 (*)    All other components within normal limits  RESP PANEL BY RT-PCR (FLU A&B, COVID) [id number]  TROPONIN I (HIGH SENSITIVITY)  TROPONIN I (HIGH SENSITIVITY)    EKG EKG Interpretation  Date/Time:  Saturday February 28 2021 22:04:09 EST Ventricular Rate:  64 PR Interval:  179 QRS Duration: 92 QT Interval:  418 QTC Calculation: 432 R Axis:   82 Text  Interpretation: Sinus rhythm Borderline right axis deviation Low voltage, precordial leads Borderline repolarization abnormality since last tracing no significant change Confirmed by Daleen Bo [phone]) on 02/28/2021 10:09:24 PM  Radiology DG Chest Port 1 View  Result Date:  02/28/2021 CLINICAL DATA:  Chest pain and shortness of breath. EXAM: PORTABLE CHEST 1 VIEW COMPARISON:  February 25, 2021 FINDINGS: Multiple sternal wires are noted. Stable mild to moderate severity linear scarring and/or atelectasis is seen within the bilateral lung bases. There is no evidence of a pleural effusion or pneumothorax. The heart size and mediastinal contours are within normal limits. Degenerative changes seen throughout the thoracic spine. IMPRESSION: Stable mild to moderate severity bibasilar linear scarring and/or atelectasis. Electronically Signed   By: Virgina Norfolk M.D.   On: 02/28/2021 22:39    Procedures Procedures    Medications Ordered in ED Medications  predniSONE (DELTASONE) tablet 60 mg (60 mg Oral Given 02/28/21 2354)    ED Course/ Medical Decision Making/ A&P Clinical Course as of 02/28/21 2355  Sat Feb 28, 2021  2338 Oxygen removed and he is maintaining normal oxygen saturation at 99% on room air.  Family member in the room.  She states that he is looking better than he did 3 days ago and is in the ED.  She has been taking his blood pressure at home and it was in the 80s, and now has been normal, here during the ED evaluation.  Patient ambulating to the bathroom to urinate now. [EW]  2351 Patient was able to ambulate on his own to the bathroom, he is being monitored for oxygen saturation on room air.  His saturations decreased to the low 80s, after he returned to the bed.  He laid down and relatively quickly his saturations improved to 100%.  He did not remain in respiratory discomfort.  I discussed these findings with the patient as well as his daughter-in-law at the bedside.  Patient is  followed closely by pulmonary, and has been treated with various medicines in the past.  He is currently using albuterol inhaler as needed.  He stopped smoking many years ago.  Patient will be given a dose of prednisone now with a prescription sent to his pharmacy.  He will be recommended to follow-up with pulmonary for oxygen testing, to see if he requires ongoing management with oxygen. [EW]    Clinical Course User Index [EW] Daleen Bo, MD                           Medical Decision Making Patient presenting with chest pain, taking nitroglycerin with improvement.  He was recently evaluated for upper respiratory/sinus problems and started on antibiotic medication.  He follows regularly with his cardiologist and is currently anticoagulated.  He is being evaluated today for ACS, pneumonia, heart failure and complications of neutropenia.  Problems Addressed: Neutropenia, unspecified type [facility]): acute illness or injury    Details: Patient evaluated 3 days ago, found to be neutropenic, presenting now with symptoms not associated with neutropenia. Nonspecific chest pain: acute illness or injury    Details: New onset chest pain, prior history of cardiac coronary disease.  Amount and/or Complexity of Data Reviewed Independent Historian: parent    Details: He is a cogent historian Labs: ordered.    Details: CBC, metabolic panel, troponin-normal except [place] high, hemoglobin low, platelets low, neutrophils low, glucose high, BUN high, creatinine, calcium low, albumin low Radiology: ordered and independent interpretation performed.    Details: Chest x-ray-chronic bilateral lower lobe scarring/atelectasis ECG/medicine tests: ordered and independent interpretation performed.    Details: Cardiac monitor, normal sinus rhythm  Risk Prescription drug management. Decision regarding hospitalization. Risk Details: Patient with soreness in the chest, for  greater than 12 hours, took some nitroglycerin  with improvement.  Troponin negative.  EKG stable, unchanged from prior.  Chest x-ray unchanged.  Incidental mild neutropenia is being evaluated, and was previously discussed with hematology, 3 days ago.  He has short-term follow-up planned with hematology on 03/03/2021           Final Clinical Impression(s) / ED Diagnoses Final diagnoses:  Nonspecific chest pain  Neutropenia, unspecified type ([place])  Chronic obstructive pulmonary disease, unspecified COPD type ([place])    Rx / DC Orders ED Discharge Orders          Ordered    predniSONE (DELTASONE) 10 MG tablet        02/28/21 2353              Daleen Bo, MD 03/01/21 [phone]

## 2021-02-28 NOTE — Discharge Instructions (Signed)
The testing today did not show any heart problems.  You do have some problems with oxygenation with ambulation.  We are giving prednisone to help treat for a possible exacerbation of COPD.  Start the prescription tomorrow.  Call your pulmonary doctor for follow-up as soon as possible to get further evaluation for the need of oxygen therapy.  Your PCP could potentially do that as well too.  Follow-up with the hematologist on Tuesday as scheduled to be evaluated and treated for neutropenia.  Return here if your condition worsens.

## 2021-02-28 NOTE — ED Triage Notes (Signed)
Pt c/o CP and SOB all day today. Pt took 2 nitro tabs prior to coming to ED.

## 2021-03-01 NOTE — ED Notes (Signed)
Went over d/c papers with family. All questions answered. Wheeled to lobby.

## 2021-03-03 ENCOUNTER — Inpatient Hospital Stay (HOSPITAL_COMMUNITY): Payer: Medicare Other | Attending: Hematology | Admitting: Hematology

## 2021-03-03 ENCOUNTER — Other Ambulatory Visit: Payer: Self-pay

## 2021-03-03 ENCOUNTER — Inpatient Hospital Stay (HOSPITAL_COMMUNITY): Payer: Medicare Other

## 2021-03-03 ENCOUNTER — Encounter (HOSPITAL_COMMUNITY): Payer: Self-pay | Admitting: Hematology

## 2021-03-03 VITALS — BP 121/58 | HR 64 | Temp 98.6°F | Resp 20 | Ht 60.63 in | Wt 155.7 lb

## 2021-03-03 DIAGNOSIS — D709 Neutropenia, unspecified: Secondary | ICD-10-CM | POA: Insufficient documentation

## 2021-03-03 DIAGNOSIS — C9500 Acute leukemia of unspecified cell type not having achieved remission: Secondary | ICD-10-CM | POA: Insufficient documentation

## 2021-03-03 DIAGNOSIS — I509 Heart failure, unspecified: Secondary | ICD-10-CM | POA: Insufficient documentation

## 2021-03-03 DIAGNOSIS — Z8042 Family history of malignant neoplasm of prostate: Secondary | ICD-10-CM | POA: Insufficient documentation

## 2021-03-03 DIAGNOSIS — E538 Deficiency of other specified B group vitamins: Secondary | ICD-10-CM | POA: Diagnosis not present

## 2021-03-03 DIAGNOSIS — I110 Hypertensive heart disease with heart failure: Secondary | ICD-10-CM | POA: Diagnosis not present

## 2021-03-03 DIAGNOSIS — Z801 Family history of malignant neoplasm of trachea, bronchus and lung: Secondary | ICD-10-CM | POA: Diagnosis not present

## 2021-03-03 DIAGNOSIS — C61 Malignant neoplasm of prostate: Secondary | ICD-10-CM | POA: Diagnosis not present

## 2021-03-03 DIAGNOSIS — Z87891 Personal history of nicotine dependence: Secondary | ICD-10-CM | POA: Diagnosis not present

## 2021-03-03 LAB — FIBRINOGEN: Fibrinogen: 254 mg/dL (ref 210–475)

## 2021-03-03 LAB — CULTURE, BLOOD (ROUTINE X 2)
Culture: NO GROWTH
Culture: NO GROWTH
Special Requests: ADEQUATE

## 2021-03-03 LAB — PROTIME-INR
INR: 2.8 — ABNORMAL HIGH (ref 0.8–1.2)
Prothrombin Time: 29.8 seconds — ABNORMAL HIGH (ref 11.4–15.2)

## 2021-03-03 LAB — METHYLMALONIC ACID, SERUM: Methylmalonic Acid, Quantitative: 394 nmol/L — ABNORMAL HIGH (ref 0–378)

## 2021-03-03 MED ORDER — CYANOCOBALAMIN 1000 MCG/ML IJ SOLN
1000.0000 ug | Freq: Once | INTRAMUSCULAR | Status: AC
  Administered 2021-03-03: 1000 ug via INTRAMUSCULAR
  Filled 2021-03-03: qty 1

## 2021-03-03 NOTE — Progress Notes (Signed)
Patient in today for office visit. B-12 injection ordered. See Mar for injection information. Patient remained stable throughout injection. Patient was discharged with daughter and grand daughter via wheelchair in stable condition.

## 2021-03-03 NOTE — Progress Notes (Signed)
[facility] [address] [place] [address]   CLINIC:  Medical Oncology/Hematology  Patient Care Team: [name], MD as PCP - General (Internal Medicine) Harl Bowie Alphonse Guild, MD as PCP - Cardiology (Cardiology) Irine Seal, MD as Attending Physician (Urology) Gala Romney Cristopher Estimable, MD as Consulting Physician (Gastroenterology) Derek Jack, MD as Medical Oncologist (Medical Oncology)  CHIEF COMPLAINTS/PURPOSE OF CONSULTATION:  Evaluation of abnormal CBC.  HISTORY OF PRESENTING ILLNESS:  Vincent Carter 76 y.o. male is here because of evaluation of neutropenia, at the request of Ed.  Today he reports feeling well, and he is accompanied by his daughter and granddaughter. He presented to the ED on 02/28/21 due to low BP; his daughter reports his BP was around 80/40 at home. He reports chronic SOB. His daughter reports his O2 is in the low 80s while walking, but returns to normal while seated. He has a history of COPD for which he currently has an albuterol inhaler and nebulizer. He reports cough. His weight is stable. He denies fevers and night sweats. He was treated for a sinus infection 1 month ago. He has a history of prostate cancer. He reports a history of MI and denies history of CVA. He has occasional tingling/numbness in his hands and feet which he reports has been presents for a long time. He denies skin rashes.   He currently lives at home on his own, and he is able to drive and do his typical home activities. Prior tot retirement he worked in [employer], and he denies Banker exposure. He quit smoking 5-6 years ago after smoking 2 ppd for 60 years. He denies family history of cancer.   MEDICAL HISTORY:  Past Medical History:  Diagnosis Date   Arthritis    ASCVD (arteriosclerotic cardiovascular disease) cardiologist--  is now dr Roderic Palau branch (was dr [name])    CABG in 1997 -- LIMA to LAD;  cath 07-17-2012 PCI and BMStenting;   RE-DO CABG X4   in  07-24-2004 three-vessel disease; cath in 08-24-2004 post CABG ef 40-45% w/ anteroapical hypokinesis; occlusion SVG  to  RI; patent RIMA to LAD; unsuccessful PCI in totally occluded RI graft.;  last myoview 09-09-2013, no ischemia, moderate inferior wall defect, ef 49%   CHF (congestive heart failure) (HCC)    Chronic obstructive pulmonary disease ([place])    Diverticulosis of colon    Euthyroid goiter    with low TSH    History of colon polyps    hyperplastic 2011   History of diverticulitis    History of kidney stones    History of ST elevation myocardial infarction (STEMI)    07-17-2004   w/ extensive RV infact per cath report   HOH (hard of hearing)    no hearing aid   Hypertension    Low TSH level    Clinically euthyroid   Peripheral vascular disease (HCC)    Decreased distal pulses and abnormal Doppler   Prostate cancer [facility]) urologist--  dr Irine Seal   T1c, Gleason 3+4=7,  PSA 4.71,  vol 38cc   Ptosis    Left, secondary to injury   S/p bare metal coronary artery stent    x3  to RCA  07-17-2004    SURGICAL HISTORY: Past Surgical History:  Procedure Laterality Date   [place]  08-24-2004  dr Lia Foyer   post re-do CABG/  Unsuccessful attempt PCI of clotted SVG to RI/  anteroapical segment severely hypokinetic,  ef 40-45%/  fresh occlusion SVG to RI,  continued patency of SVG to PAD and RIMA to LAD /   dLM 80%   CARDIOVASCULAR STRESS TEST  09-09-2013  dr Roderic Palau branch   no reversible ischemia,  moderately large defect noted in the inferior wall extending from  mid ventricle to apex also involves true ventricle apex and anteroapical wall/  hypokinesis of inferior wall in region of prior infarct/scarring/  LVEF 49%   CATARACT EXTRACTION W/ INTRAOCULAR LENS  IMPLANT, BILATERAL  2006   COLONOSCOPY W/ POLYPECTOMY  12-26-2009   CORONARY ANGIOPLASTY WITH STENT PLACEMENT  07-17-2004   dr Sabino Snipes   PCI w/ BMS x3 to RCA and balloon pump  placement/  LVSF after revascularization , ef 65% w/ mild inferior hypokinesis/  dLM  70% extending into origins of LAD (70% ostial, 80% mid, D1 branch 90% ostial)  CFX (80% ostial OM, 70% after first branch) /   99% mRCA stented and no residual;  remains 80% origin of posterior LV branch/  most apical portion of PDA was embolized    CORONARY ARTERY BYPASS GRAFT  1997   dr Merleen Nicely   LIMA to LAD   CORONARY ARTERY BYPASS GRAFT  07-24-2004  dr gerhardt   RIMA to LAD,  SVG to OM,  SVG to PDA,  SVG to RI   EYE SURGERY Left 1996   repair ocular injury   ORIF RIGHT TIB-FIB FX  1990's   PROSTATE BIOPSY     RADIOACTIVE SEED IMPLANT N/A 05/02/2014   Procedure: RADIOACTIVE SEED IMPLANT;  Surgeon: Irine Seal, MD;  Location: [facility];  Service: Urology;  Laterality: N/A;   TONSILLECTOMY  age 1   TOTAL HIP ARTHROPLASTY Left 1983   RECONSTRUCTION/ THA/  SKIN AND BONE GRAFTS--  pt fell from 5 stories off [place]   TRANSTHORACIC ECHOCARDIOGRAM  [id number]   diastolic dysfunction/  ef 45-50%/  mild AV calcification without stenosis,  mild AR/  mild LAE and RAE/  trivial MR and TR      SOCIAL HISTORY: Social History   Socioeconomic History   Marital status: Widowed    Spouse name: Not on file   Number of children: Not on file   Years of education: Not on file   Highest education level: Not on file  Occupational History   Occupation: Retired  Tobacco Use   Smoking status: Former    Packs/day: 1.00    Years: 55.00    Pack years: 55.00    Types: Cigarettes    Start date: 08/13/1958    Quit date: 04/12/2018    Years since quitting: 2.8   Smokeless tobacco: Never  Vaping Use   Vaping Use: Former  Substance and Sexual Activity   Alcohol use: No    Alcohol/week: 0.0 standard drinks   Drug use: No   Sexual activity: Not on file  Other Topics Concern   Not on file  Social History Narrative   Widowed   No regular exercise   Social Determinants of Health   Financial  Resource Strain: Not on file  Food Insecurity: Not on file  Transportation Needs: Not on file  Physical Activity: Not on file  Stress: Not on file  Social Connections: Not on file  Intimate Partner Violence: Not on file    FAMILY HISTORY: Family History  Problem Relation Age of Onset   Clotting disorder Mother    Heart attack Mother    Lung cancer Father  Cancer Brother        prostate    ALLERGIES:  is allergic to augmentin [amoxicillin-pot clavulanate], codeine, and statins.  MEDICATIONS:  Current Outpatient Medications  Medication Sig Dispense Refill   carvedilol (COREG) 12.5 MG tablet Take 1 tablet (12.5 mg total) by mouth 2 (two) times daily with a meal. 180 tablet 0   clindamycin (CLEOCIN) 300 MG capsule Take 1 capsule (300 mg total) by mouth 4 (four) times daily. X 7 days 28 capsule 0   dextromethorphan-guaiFENesin (MUCINEX DM) 30-600 MG 12hr tablet Take 1 tablet by mouth 2 (two) times daily. (Patient taking differently: Take 1 tablet by mouth 2 (two) times daily as needed.) 30 tablet 0   fluticasone (FLONASE) 50 MCG/ACT nasal spray Place 2 sprays into both nostrils daily. 16 g 0   furosemide (LASIX) 20 MG tablet Take 1 tablet (20 mg total) by mouth daily. 90 tablet 3   hydrochlorothiazide (HYDRODIURIL) 25 MG tablet Take 25 mg by mouth daily.     ipratropium-albuterol (DUONEB) 0.5-2.5 (3) MG/3ML SOLN Take 3 mLs by nebulization every 6 (six) hours as needed. 360 mL 3   lisinopril (ZESTRIL) 2.5 MG tablet Take 1 tablet (2.5 mg total) by mouth daily. 30 tablet 11   montelukast (SINGULAIR) 10 MG tablet TAKE 1 TABLET BY MOUTH AT BEDTIME 30 tablet 6   Nebulizer MISC Please provide 1 nebulizer machine and all supplies. Diagnosis: J44.1, Office number: [phone] 1 each 0   nitroGLYCERIN (NITROSTAT) 0.4 MG SL tablet PLACE 1 TABLET UNDER TONGUE EVERY 5 MINUTES AS NEEDED FOR CHEST PAIN. 25 tablet 3   Omega-3 Fatty Acids (FISH OIL) 300 MG CAPS Take 2 capsules by mouth 2 (two) times  daily.     predniSONE (DELTASONE) 10 MG tablet Take q day 6,5,4,3,2,1 21 tablet 0   SYMBICORT 80-4.5 MCG/ACT inhaler SMARTSIG:2 Puff(s) Via Inhaler Morning-Evening     warfarin (COUMADIN) 2.5 MG tablet Take 1.5 tablets (3.75 mg total) every Monday, Wednesday, Friday, and Saturday, and 1 tablet (2.5 mg) on other days 40 tablet 6   potassium chloride SA (KLOR-CON M) 20 MEQ tablet Take 1 tablet (20 mEq total) by mouth 2 (two) times daily for 3 days. 6 tablet 0   No current facility-administered medications for this visit.    REVIEW OF SYSTEMS:   Review of Systems  Constitutional:  Positive for fatigue. Negative for appetite change, fever and unexpected weight change.  HENT:   Positive for trouble swallowing.   Respiratory:  Positive for cough and shortness of breath.   Gastrointestinal:  Positive for diarrhea.  Endocrine: Negative for hot flashes.  Skin:  Negative for rash.  Neurological:  Positive for dizziness and numbness.  All other systems reviewed and are negative.   PHYSICAL EXAMINATION: ECOG PERFORMANCE STATUS: 1 - Symptomatic but completely ambulatory  Vitals:   03/03/21 1401  BP: (!) 121/58  Pulse: 64  Resp: 20  Temp: 98.6 F (37 C)  SpO2: 100%   Filed Weights   03/03/21 1401  Weight: 155 lb 11.2 oz (70.6 kg)   Physical Exam Vitals reviewed.  Constitutional:      Appearance: Normal appearance.  Cardiovascular:     Rate and Rhythm: Normal rate and regular rhythm.     Pulses: Normal pulses.     Heart sounds: Normal heart sounds.  Pulmonary:     Effort: Pulmonary effort is normal.     Breath sounds: Normal breath sounds.  Abdominal:     Palpations: Abdomen is soft.  There is no hepatomegaly, splenomegaly or mass.     Tenderness: There is no abdominal tenderness.  Musculoskeletal:     Right lower leg: 1+ Edema (more than L) present.     Left lower leg: 1+ Edema (less than R) present.  Lymphadenopathy:     Cervical: No cervical adenopathy.     Right cervical:  No superficial cervical adenopathy.    Left cervical: No superficial cervical adenopathy.     Upper Body:     Right upper body: No supraclavicular adenopathy.     Left upper body: No supraclavicular adenopathy.  Neurological:     General: No focal deficit present.     Mental Status: He is alert and oriented to person, place, and time.  Psychiatric:        Mood and Affect: Mood normal.        Behavior: Behavior normal.     LABORATORY DATA:  I have reviewed the data as listed Recent Results (from the past 2160 hour(s))  POCT INR     Status: Normal   Collection Time: 12/11/20  9:06 AM  Result Value Ref Range   INR 2.1 2.0 - 3.0  POCT INR     Status: Normal   Collection Time: 01/08/21  9:20 AM  Result Value Ref Range   INR 2.8 2.0 - 3.0  POCT INR     Status: None   Collection Time: 02/19/21  9:23 AM  Result Value Ref Range   INR 2.9 2.0 - 3.0  Surgical pathology     Status: None   Collection Time: 02/25/21 12:00 AM  Result Value Ref Range   SURGICAL PATHOLOGY      Surgical Pathology CASE: [id number] PATIENT: Susa Griffins Flow Pathology Report     Clinical history: Neutropenia   DIAGNOSIS:  -  A myeloblast population with the phenotype listed below comprises 6% of all cellular events -  See comment  COMMENT:  The peripheral blood has a macrocytic anemia, absolute monocytosis and thrombocytopenia.  There are hypogranular and hypolobated neutrophils present.  Circulating blasts do not display Auer rods.  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  Additional work-up is recommended.  GATING AND PHENOTYPIC ANALYSIS:  Gated population: Flow cytometric immunophenotyping is performed using antibodies to the antigens listed in the table below. Electronic gates are placed around a cell cluster displaying light scatter properties corresponding to: blasts  Abnormal Cells in sample: 6%  Phenotype of Abnormal Cells: CD34, CD38, CD117, CD123,  CD200, HLA-Dr                       Lymphoid Antigens        Myeloid Antigens Miscellaneous CD2  NEG  CD10 NEG  CD11b     NEG  CD45 POS CD3  NEG  CD19 NEG  CD11c     ND   HLA-Dr    POS CD4  NEG  CD20 NEG  CD13 NEG  CD34 POS CD5  NEG  CD22 ND   CD14 NEG  CD38 POS CD7  NEG  CD79b     ND   CD15 NEG  CD138     ND CD8  NEG  CD103     ND   CD16 NEG  TdT  ND CD25 ND   CD200     POS  CD33 NEG  CD123     POS TCRab     ND   sKappa    NEG  CD64 NEG  CD41 ND TCRgd     NEG  sLambda   NEG  CD117     POS  CD61 ND CD56 NEG  cKappa    ND   MPO  ND   CD71 ND CD57 ND   cLambda   ND        [id number]     GROSS DESCRIPTION:  One lavender top tube submitted from [facility] for leukemia testing.    Final Diagnosis performed by Thressa Sheller, MD.   Electronically signed 02/26/2021 Technical component performed at [facility], [place] [address], [place], [place] [address].  Professional component performed at [employer]. [facility], [place] [address], [place], [place] [address].   Comprehensive metabolic panel     Status: Abnormal   Collection Time: 02/25/21  8:39 AM  Result Value Ref Range   Sodium 134 (L) 135 - 145 mmol/L   Potassium 3.0 (L) 3.5 - 5.1 mmol/L   Chloride 102 98 - 111 mmol/L   CO2 21 (L) 22 - 32 mmol/L   Glucose, Bld 120 (H) 70 - 99 mg/dL    Comment: Glucose reference range applies only to samples taken after fasting for at least 8 hours.   BUN 30 (H) 8 - 23 mg/dL   Creatinine, Ser 1.53 (H) 0.61 - 1.24 mg/dL   Calcium 7.8 (L) 8.9 - 10.3 mg/dL   Total Protein 7.0 6.5 - 8.1 g/dL   Albumin 3.2 (L) 3.5 - 5.0 g/dL   AST 10 (L) 15 - 41 U/L   ALT 11 0 - 44 U/L   Alkaline Phosphatase 48 38 - 126 U/L   Total Bilirubin 1.1 0.3 - 1.2 mg/dL   GFR, Estimated 47 (L) >60 mL/min    Comment: (NOTE) Calculated using the CKD-EPI Creatinine Equation (2021)    Anion gap 11 5 - 15    Comment: Performed at [facility], [address] [place], [place] [address]   CBC with Differential     Status: Abnormal   Collection Time: 02/25/21  8:39 AM  Result Value Ref Range   WBC 11.2 (H) 4.0 - 10.5 K/uL   RBC 3.06 (L) 4.22 - 5.81 MIL/uL   Hemoglobin 10.6 (L) 13.0 - 17.0 g/dL   HCT 31.8 (L) 39.0 - 52.0 %   MCV 103.9 (H) 80.0 - 100.0 fL   MCH 34.6 (H) 26.0 - 34.0 pg   MCHC 33.3 30.0 - 36.0 g/dL   RDW 18.6 (H) 11.5 - 15.5 %   Platelets 134 (L) 150 - 400 K/uL    Comment: Immature Platelet Fraction may be clinically indicated, consider ordering this additional test [id number]    nRBC 0.0 0.0 - 0.2 %   Neutrophils Relative % 3 %   Neutro Abs 0.3 (LL) 1.7 - 7.7 K/uL    Comment: This critical result has verified and been called to S RODGERS by [facility] on 02 15 2023 at 0943, and has been read back.    Lymphocytes Relative 39 %   Lymphs Abs 4.4 (H) 0.7 - 4.0 K/uL   Monocytes Relative 58 %   Monocytes Absolute 6.5 (H) 0.1 - 1.0 K/uL   Eosinophils Relative 0 %   Eosinophils Absolute 0.0 0.0 - 0.5 K/uL   Basophils Relative 0 %   Basophils Absolute 0.0 0.0 - 0.1 K/uL   RBC Morphology MORPHOLOGY UNREMARKABLE    Smear Review MORPHOLOGY UNREMARKABLE     Comment: Performed at [facility],  [address] [place], [place] [address]  Protime-INR     Status: Abnormal   Collection Time: 02/25/21  8:39 AM  Result Value Ref Range   Prothrombin Time 20.9 (H) 11.4 - 15.2 seconds   INR 1.8 (H) 0.8 - 1.2    Comment: (NOTE) INR goal varies based on device and disease states. Performed at [facility], [address] [place], [place] [address]   Pathologist smear review     Status: None   Collection Time: 02/25/21  8:39 AM  Result Value Ref Range   Path Review 02/26/2021     Comment: MONOCYTOSIS AND NEUTROPENIA. Reviewed by Chrystie Nose. Saralyn Pilar, M.D. Performed at [facility], [place] [address], [place], [place] [address]   POC occult blood, ED     Status: None   Collection Time: 02/25/21 11:23 AM  Result Value Ref Range   Fecal Occult  Bld NEGATIVE NEGATIVE  Reticulocytes     Status: Abnormal   Collection Time: 02/25/21 11:38 AM  Result Value Ref Range   Retic Ct Pct 1.0 0.4 - 3.1 %   RBC. 2.79 (L) 4.22 - 5.81 MIL/uL   Retic Count, Absolute 28.5 19.0 - 186.0 K/uL   Immature Retic Fract 19.4 (H) 2.3 - 15.9 %    Comment: Performed at [facility], [address], [place], [place] [address]  Culture, blood (routine x 2)     Status: None   Collection Time: 02/25/21 11:38 AM   Specimen: BLOOD  Result Value Ref Range   Specimen Description BLOOD BLOOD RIGHT HAND    Special Requests      BOTTLES DRAWN AEROBIC AND ANAEROBIC Blood Culture adequate volume   Culture      NO GROWTH 6 DAYS Performed at [facility], [address] [place], [place] [address]    Report Status 03/03/2021 FINAL   Vitamin B12     Status: Abnormal   Collection Time: 02/25/21 11:39 AM  Result Value Ref Range   Vitamin B-12 112 (L) 180 - 914 pg/mL    Comment: (NOTE) This assay is not validated for testing neonatal or myeloproliferative syndrome specimens for Vitamin B12 levels. Performed at [facility], [address], [place], [place] [address]   Folate, serum, performed at [facility] lab     Status: None   Collection Time: 02/25/21 11:39 AM  Result Value Ref Range   Folate 7.7 >5.9 ng/mL    Comment: Performed at [facility], [address], [place], [place] [address]  Methylmalonic acid, serum     Status: Abnormal   Collection Time: 02/25/21 11:39 AM  Result Value Ref Range   Methylmalonic Acid, Quantitative 394 (H) 0 - 378 nmol/L    Comment: (NOTE) This test was developed and its performance characteristics determined by [facility]. It has not been cleared or approved by the Food and Drug Administration. Performed At: [facility] [address] [place], [place] [id number] Rush Farmer MD [id number]   Copper, serum     Status: None   Collection Time: 02/25/21 11:39 AM  Result Value Ref Range   Copper 114 69 - 132 ug/dL     Comment: (NOTE) This test was developed and its performance characteristics determined by [facility]. It has not been cleared or approved by the Food and Drug Administration.                                Detection Limit =  5 Performed At: [facility] [address] [place], [place] [id number] Rush Farmer MD [id number]   Lactate dehydrogenase     Status: None   Collection Time: 02/25/21 11:39 AM  Result Value Ref Range   LDH 106 98 - 192 U/L    Comment: Performed at [facility], [address], [place], [place] [address]  Flow Cytometry, Peripheral Blood (Oncology)     Status: None   Collection Time: 02/25/21 11:39 AM  Result Value Ref Range   Flow Cytometry SEE SEPARATE REPORT     Comment: Performed at [facility], [place] [address], [place], [place] [address]  Culture, blood (routine x 2)     Status: None   Collection Time: 02/25/21 11:39 AM   Specimen: BLOOD  Result Value Ref Range   Specimen Description BLOOD LEFT ANTECUBITAL    Special Requests      BOTTLES DRAWN AEROBIC AND ANAEROBIC Blood Culture results may not be optimal due to an excessive volume of blood received in culture bottles   Culture      NO GROWTH 6 DAYS Performed at [facility], [address], [place], [place] [address]    Report Status 03/03/2021 FINAL   CBC with Differential     Status: Abnormal   Collection Time: 02/28/21 10:08 PM  Result Value Ref Range   WBC 11.8 (H) 4.0 - 10.5 K/uL   RBC 2.82 (L) 4.22 - 5.81 MIL/uL   Hemoglobin 9.8 (L) 13.0 - 17.0 g/dL   HCT 29.8 (L) 39.0 - 52.0 %   MCV 105.7 (H) 80.0 - 100.0 fL   MCH 34.8 (H) 26.0 - 34.0 pg   MCHC 32.9 30.0 - 36.0 g/dL   RDW 18.9 (H) 11.5 - 15.5 %   Platelets 135 (L) 150 - 400 K/uL   nRBC 0.0 0.0 - 0.2 %   Neutrophils Relative % 3 %   Neutro Abs 0.4 (LL) 1.7 - 7.7 K/uL    Comment: This critical result has verified and been called to [facility] by Duncan Dull on 02 18 2023 at 2309, and has been read back.     Lymphocytes Relative 32 %   Lymphs Abs 3.8 0.7 - 4.0 K/uL   Monocytes Relative 64 %   Monocytes Absolute 7.5 (H) 0.1 - 1.0 K/uL   Eosinophils Relative 0 %   Eosinophils Absolute 0.1 0.0 - 0.5 K/uL   Basophils Relative 0 %   Basophils Absolute 0.0 0.0 - 0.1 K/uL   WBC Morphology MORPHOLOGY UNREMARKABLE     Comment: ATYPICAL MONONUCLEAR CELLS   Immature Granulocytes 1 %   Abs Immature Granulocytes 0.06 0.00 - 0.07 K/uL    Comment: Performed at [facility], [address] [place], [place] [address]  Comprehensive metabolic panel     Status: Abnormal   Collection Time: 02/28/21 10:08 PM  Result Value Ref Range   Sodium 135 135 - 145 mmol/L   Potassium 4.1 3.5 - 5.1 mmol/L   Chloride 108 98 - 111 mmol/L   CO2 22 22 - 32 mmol/L   Glucose, Bld 119 (H) 70 - 99 mg/dL    Comment: Glucose reference range applies only to samples taken after fasting for at least 8 hours.   BUN 26 (H) 8 - 23 mg/dL   Creatinine, Ser 1.38 (H) 0.61 - 1.24 mg/dL   Calcium 8.0 (L) 8.9 - 10.3 mg/dL   Total Protein 6.9 6.5 - 8.1 g/dL   Albumin 2.8 (L) 3.5 - 5.0  g/dL   AST 11 (L) 15 - 41 U/L   ALT 13 0 - 44 U/L   Alkaline Phosphatase 50 38 - 126 U/L   Total Bilirubin 0.8 0.3 - 1.2 mg/dL   GFR, Estimated 53 (L) >60 mL/min    Comment: (NOTE) Calculated using the CKD-EPI Creatinine Equation (2021)    Anion gap 5 5 - 15    Comment: Performed at [facility], [address], [place], [place] [address]  Troponin I (High Sensitivity)     Status: None   Collection Time: 02/28/21 10:08 PM  Result Value Ref Range   Troponin I (High Sensitivity) 6 <18 ng/L    Comment: (NOTE) Elevated high sensitivity troponin I (hsTnI) values and significant  changes across serial measurements may suggest ACS but many other  chronic and acute conditions are known to elevate hsTnI results.  Refer to the "Links" section for chest pain algorithms and additional  guidance. Performed at [facility], [address], [place], [place]  [address]   Resp Panel by RT-PCR (Flu A&B, Covid) Nasopharyngeal Swab     Status: None   Collection Time: 02/28/21 10:19 PM   Specimen: Nasopharyngeal Swab; Nasopharyngeal(NP) swabs in vial transport medium  Result Value Ref Range   SARS Coronavirus 2 by RT PCR NEGATIVE NEGATIVE    Comment: (NOTE) SARS-CoV-2 target nucleic acids are NOT DETECTED.  The SARS-CoV-2 RNA is generally detectable in upper respiratory specimens during the acute phase of infection. The lowest concentration of SARS-CoV-2 viral copies this assay can detect is 138 copies/mL. A negative result does not preclude SARS-Cov-2 infection and should not be used as the sole basis for treatment or other patient management decisions. A negative result may occur with  improper specimen collection/handling, submission of specimen other than nasopharyngeal swab, presence of viral mutation(s) within the areas targeted by this assay, and inadequate number of viral copies(<138 copies/mL). A negative result must be combined with clinical observations, patient history, and epidemiological information. The expected result is Negative.  Fact Sheet for Patients:  EntrepreneurPulse.com.au  Fact Sheet for Healthcare Providers:  IncredibleEmployment.be  This test is no t yet approved or cleared by the [place] FDA and  has been authorized for detection and/or diagnosis of SARS-CoV-2 by FDA under an Emergency Use Authorization (EUA). This EUA will remain  in effect (meaning this test can be used) for the duration of the COVID-19 declaration under Section 564(b)(1) of the Act, 21 U.S.C.section 360bbb-3(b)(1), unless the authorization is terminated  or revoked sooner.       Influenza A by PCR NEGATIVE NEGATIVE   Influenza B by PCR NEGATIVE NEGATIVE    Comment: (NOTE) The Xpert Xpress SARS-CoV-2/FLU/RSV plus assay is intended as an aid in the diagnosis of influenza from Nasopharyngeal swab  specimens and should not be used as a sole basis for treatment. Nasal washings and aspirates are unacceptable for Xpert Xpress SARS-CoV-2/FLU/RSV testing.  Fact Sheet for Patients: EntrepreneurPulse.com.au  Fact Sheet for Healthcare Providers: IncredibleEmployment.be  This test is not yet approved or cleared by the [place] FDA and has been authorized for detection and/or diagnosis of SARS-CoV-2 by FDA under an Emergency Use Authorization (EUA). This EUA will remain in effect (meaning this test can be used) for the duration of the COVID-19 declaration under Section 564(b)(1) of the Act, 21 U.S.C. section 360bbb-3(b)(1), unless the authorization is terminated or revoked.  Performed at [facility], [address], [place], [place] [address]     RADIOGRAPHIC STUDIES: I  have personally reviewed the radiological images as listed and agreed with the findings in the report. DG Chest Port 1 View  Result Date: 02/28/2021 CLINICAL DATA:  Chest pain and shortness of breath. EXAM: PORTABLE CHEST 1 VIEW COMPARISON:  February 25, 2021 FINDINGS: Multiple sternal wires are noted. Stable mild to moderate severity linear scarring and/or atelectasis is seen within the bilateral lung bases. There is no evidence of a pleural effusion or pneumothorax. The heart size and mediastinal contours are within normal limits. Degenerative changes seen throughout the thoracic spine. IMPRESSION: Stable mild to moderate severity bibasilar linear scarring and/or atelectasis. Electronically Signed   By: Virgina Norfolk M.D.   On: 02/28/2021 22:39   DG Chest Port 1 View  Result Date: 02/25/2021 CLINICAL DATA:  Shortness of breath and cough since December. EXAM: PORTABLE CHEST 1 VIEW COMPARISON:  07/16/2020 FINDINGS: Previous median sternotomy and CABG procedure. Stable cardiomediastinal contours. Unchanged appearance of left midlung scarring. Mild subsegmental atelectasis and  volume loss within the right lung base is increased from previous exam. No signs of pleural effusion or interstitial edema. IMPRESSION: 1. Increase in subsegmental atelectasis and volume loss within the right lung base. 2. Stable left midlung scarring. Electronically Signed   By: Kerby Moors M.D.   On: 02/25/2021 09:38    ASSESSMENT:  Abnormal CBCD: - Patient presented with dehydration and hypotension to the ER on 02/24/2021. - CBCD showed hemoglobin 10.6 (17.2 on 07/18/2020), platelet count 134, white count 11.2 with differential showing 3% neutrophils, 39% lymphocytes, 58% monocytes. - He has CKD since 2020. - Vitamin B12 was low at 112, MMA 188, copper and folic acid normal.  LDH was 106. - Flow cytometry on 02/25/2021: Myeloblast population 6%.  Phenotype positive for CD34, CD38, CD1 1 7, CD123, CD200, HLA-DR. - No B symptoms.  Had 1 sinus infection 1 month ago requiring antibiotics.  No other infections requiring antibiotics.  Reports progressive shortness of breath recently and is being seen by Dr. Halford Chessman for management of COPD.  Daughter also reports drop in oxygen saturations on exertion.   Social/family history: - He lives by himself at home.  His daughter [name] works at [employer]. - He is retired Nature conservation officer.  No chemical exposure.  Quit smoking 5 to 6 years ago.  Smoked 2 packs/day for 60 years. - No family history of malignancies.  3.  Prostate cancer: - T1 cNX MX Gleason 7 (3+4) in 40% of the left base lateral: Gleason 6 (3+3) and 5% of the left mid lateral core, 90% of the left base lateral: 5% of the left mid lateral core. - PSA was 4.7. - Status post I-125 radioactive seed implantation on 05/02/2014.   PLAN:  Abnormal CBCD: - We have discussed findings on flow cytometry and CBC.  We have also reviewed recent CBC from 02/28/2021 which showed further worsening of hemoglobin. - Differential diagnosis includes high-grade MDS and AML. - Recommend bone marrow  aspiration and biopsy with flow cytometry, cytogenetics and FISH panel. - Would also recommend myeloid NGS panel. - We will also check PT, PTT and fibrinogen levels. - We will see him back after the biopsy.  2.  Prostate cancer: - We will also check PSA.  3.  B12 deficiency: - B12 is low with elevated methylmalonic acid. - We will give him B12 injection today.  He will take B12 1 mg tablet daily.   All questions were answered. The patient knows to call the clinic with any problems, questions  or concerns.  Derek Jack, MD 03/03/21 2:38 PM  [place] [phone]   I, Thana Ates, am acting as a scribe for Dr. Derek Jack.  I, Derek Jack MD, have reviewed the above documentation for accuracy and completeness, and I agree with the above.

## 2021-03-03 NOTE — Patient Instructions (Addendum)
[place] [facility] Discharge Instructions  You were seen and examined today by Dr. Delton Coombes. Dr. Delton Coombes is a hematologist, meaning that he specializes in blood abnormalities. Dr. Delton Coombes discussed your past medical history, family history of cancers/blood conditions and the events that led to you being here today.  You were referred to Dr. Delton Coombes by the Emergency Department Doctor due an abnormality in your white blood cell count. Dr. Delton Coombes recommended additional lab work while you were in the ED. This revealed a Vitamin B12 deficiency. For this, we will give you a B12 injection and please start taking 79m daily supplement to take until further notice. You were anemic in addition to your WBC abnormality, this is indicative of issues arising from the bone marrow, Dr. KDelton Coombeshas recommended a bone marrow biopsy. The bone marrow biopsy will evaluate for MDS (Myelodysplastic Syndrome) and Acute Leukemia.  Follow-up approximately 2 weeks after BMBx.   [facility].  To afford each patient quality time with our provider, please arrive at least 15 minutes before your scheduled appointment time.   If you have a lab appointment with the [facility] come in thru the Main Entrance and check in at the main information desk.  You need to re-schedule your appointment should you arrive 10 or more minutes late.  We strive to give you quality time with our providers, and arriving late affects you and other patients whose appointments are after yours.  Also, if you no show three or more times for appointments you may be dismissed from the clinic at the providers discretion.     Again, [facility]  Our hope is that these requests will decrease the amount of time that you wait before being seen by our physicians.        _____________________________________________________________  Should you have questions [facility] please contact our office at [phone] follow the prompts.  Our office hours are 8:00 a.m. and 4:30 p.m. Monday - Friday.  Please note that voicemails left after 4:00 p.m. may not be returned until the following business day.  We are closed weekends and major holidays.  You do have access to a nurse 24-7, just call the main number to the clinic [phone] do not press any options, hold on the line and a nurse will answer the phone.    For prescription refill requests, have your pharmacy contact our office and allow 72 hours.    Due to Covid, you will need to wear a mask upon entering the hospital. If you do not have a mask, a mask will be given to you at the Main Entrance upon arrival. For doctor visits, patients may have 1 support person age 5123or older with them. For treatment visits, patients can not have anyone with them due to social distancing guidelines and our immunocompromised population.

## 2021-03-04 ENCOUNTER — Encounter (HOSPITAL_COMMUNITY): Payer: Self-pay

## 2021-03-04 LAB — PSA: Prostatic Specific Antigen: 0.01 ng/mL (ref 0.00–4.00)

## 2021-03-04 NOTE — Progress Notes (Signed)
I met with the patient and his family during and following initial visit with Dr. Delton Coombes. I provided my contact information and encouraged the patient to call with questions and concerns.

## 2021-03-09 ENCOUNTER — Other Ambulatory Visit: Payer: Self-pay | Admitting: Radiology

## 2021-03-09 DIAGNOSIS — J441 Chronic obstructive pulmonary disease with (acute) exacerbation: Secondary | ICD-10-CM | POA: Diagnosis not present

## 2021-03-09 DIAGNOSIS — K4090 Unilateral inguinal hernia, without obstruction or gangrene, not specified as recurrent: Secondary | ICD-10-CM | POA: Diagnosis not present

## 2021-03-10 ENCOUNTER — Other Ambulatory Visit: Payer: Self-pay

## 2021-03-10 ENCOUNTER — Ambulatory Visit (HOSPITAL_COMMUNITY)
Admission: RE | Admit: 2021-03-10 | Discharge: 2021-03-10 | Disposition: A | Discharge status: Home / Self Care | Payer: Medicare Other | Source: Ambulatory Visit | Attending: Hematology | Admitting: Hematology

## 2021-03-10 DIAGNOSIS — N189 Chronic kidney disease, unspecified: Secondary | ICD-10-CM | POA: Insufficient documentation

## 2021-03-10 DIAGNOSIS — I509 Heart failure, unspecified: Secondary | ICD-10-CM | POA: Insufficient documentation

## 2021-03-10 DIAGNOSIS — D72829 Elevated white blood cell count, unspecified: Secondary | ICD-10-CM | POA: Insufficient documentation

## 2021-03-10 DIAGNOSIS — D709 Neutropenia, unspecified: Secondary | ICD-10-CM | POA: Diagnosis not present

## 2021-03-10 DIAGNOSIS — D696 Thrombocytopenia, unspecified: Secondary | ICD-10-CM | POA: Diagnosis not present

## 2021-03-10 DIAGNOSIS — E538 Deficiency of other specified B group vitamins: Secondary | ICD-10-CM | POA: Diagnosis not present

## 2021-03-10 DIAGNOSIS — Z87891 Personal history of nicotine dependence: Secondary | ICD-10-CM | POA: Insufficient documentation

## 2021-03-10 DIAGNOSIS — Z01818 Encounter for other preprocedural examination: Secondary | ICD-10-CM | POA: Insufficient documentation

## 2021-03-10 DIAGNOSIS — I252 Old myocardial infarction: Secondary | ICD-10-CM | POA: Insufficient documentation

## 2021-03-10 DIAGNOSIS — D649 Anemia, unspecified: Secondary | ICD-10-CM | POA: Diagnosis not present

## 2021-03-10 DIAGNOSIS — I2510 Atherosclerotic heart disease of native coronary artery without angina pectoris: Secondary | ICD-10-CM | POA: Insufficient documentation

## 2021-03-10 DIAGNOSIS — Z951 Presence of aortocoronary bypass graft: Secondary | ICD-10-CM | POA: Diagnosis not present

## 2021-03-10 DIAGNOSIS — Z8546 Personal history of malignant neoplasm of prostate: Secondary | ICD-10-CM | POA: Diagnosis not present

## 2021-03-10 DIAGNOSIS — Z955 Presence of coronary angioplasty implant and graft: Secondary | ICD-10-CM | POA: Insufficient documentation

## 2021-03-10 DIAGNOSIS — I130 Hypertensive heart and chronic kidney disease with heart failure and stage 1 through stage 4 chronic kidney disease, or unspecified chronic kidney disease: Secondary | ICD-10-CM | POA: Diagnosis not present

## 2021-03-10 DIAGNOSIS — I739 Peripheral vascular disease, unspecified: Secondary | ICD-10-CM | POA: Insufficient documentation

## 2021-03-10 DIAGNOSIS — D539 Nutritional anemia, unspecified: Secondary | ICD-10-CM | POA: Diagnosis not present

## 2021-03-10 DIAGNOSIS — K5790 Diverticulosis of intestine, part unspecified, without perforation or abscess without bleeding: Secondary | ICD-10-CM | POA: Diagnosis not present

## 2021-03-10 DIAGNOSIS — M1990 Unspecified osteoarthritis, unspecified site: Secondary | ICD-10-CM | POA: Diagnosis not present

## 2021-03-10 DIAGNOSIS — Z8673 Personal history of transient ischemic attack (TIA), and cerebral infarction without residual deficits: Secondary | ICD-10-CM | POA: Insufficient documentation

## 2021-03-10 DIAGNOSIS — N200 Calculus of kidney: Secondary | ICD-10-CM | POA: Diagnosis not present

## 2021-03-10 DIAGNOSIS — C9500 Acute leukemia of unspecified cell type not having achieved remission: Secondary | ICD-10-CM | POA: Diagnosis not present

## 2021-03-10 LAB — CBC WITH DIFFERENTIAL/PLATELET
Abs Immature Granulocytes: 0 K/uL (ref 0.00–0.07)
Basophils Absolute: 0 K/uL (ref 0.0–0.1)
Basophils Relative: 0 %
Blasts: 11 %
Eosinophils Absolute: 0 K/uL (ref 0.0–0.5)
Eosinophils Relative: 0 %
HCT: 29.6 % — ABNORMAL LOW (ref 39.0–52.0)
Hemoglobin: 9.9 g/dL — ABNORMAL LOW (ref 13.0–17.0)
Lymphocytes Relative: 22 %
Lymphs Abs: 5.1 K/uL — ABNORMAL HIGH (ref 0.7–4.0)
MCH: 34.6 pg — ABNORMAL HIGH (ref 26.0–34.0)
MCHC: 33.4 g/dL (ref 30.0–36.0)
MCV: 103.5 fL — ABNORMAL HIGH (ref 80.0–100.0)
Monocytes Absolute: 15.2 K/uL — ABNORMAL HIGH (ref 0.1–1.0)
Monocytes Relative: 66 %
Neutro Abs: 0.2 K/uL — CL (ref 1.7–7.7)
Neutrophils Relative %: 1 %
Platelets: 79 K/uL — ABNORMAL LOW (ref 150–400)
RBC: 2.86 MIL/uL — ABNORMAL LOW (ref 4.22–5.81)
RDW: 18.9 % — ABNORMAL HIGH (ref 11.5–15.5)
WBC: 23.1 K/uL — ABNORMAL HIGH (ref 4.0–10.5)
nRBC: 0 % (ref 0.0–0.2)

## 2021-03-10 MED ORDER — MIDAZOLAM HCL 2 MG/2ML IJ SOLN
INTRAMUSCULAR | Status: AC | PRN
  Administered 2021-03-10: 1 mg via INTRAVENOUS

## 2021-03-10 MED ORDER — FENTANYL CITRATE (PF) 100 MCG/2ML IJ SOLN
INTRAMUSCULAR | Status: AC | PRN
  Administered 2021-03-10: 50 ug via INTRAVENOUS

## 2021-03-10 MED ORDER — FENTANYL CITRATE (PF) 100 MCG/2ML IJ SOLN
INTRAMUSCULAR | Status: AC
  Filled 2021-03-10: qty 2

## 2021-03-10 MED ORDER — MIDAZOLAM HCL 2 MG/2ML IJ SOLN
INTRAMUSCULAR | Status: AC
Start: 2021-03-10 — End: 2021-03-10
  Filled 2021-03-10: qty 4

## 2021-03-10 MED ORDER — SODIUM CHLORIDE 0.9 % IV SOLN: INTRAVENOUS | Status: DC

## 2021-03-10 NOTE — Procedures (Signed)
Vascular and Interventional Radiology Procedure Note  Patient: Vincent Carter DOB: 06/26/1945 Medical Record Number: [id number] Note Date/Time: 03/10/21 9:57 AM   Performing Physician: Michaelle Birks, MD Assistant(s): None  Diagnosis: Anemia  Procedure: BONE MARROW ASPIRATION AND BIOPSY  Anesthesia: Conscious Sedation Complications: None Estimated Blood Loss: Minimal Specimens: Sent for Pathology  Findings:  Successful CT-guided bone marrow biopsy A total of 1 cores were obtained. Hemostasis of the tract was achieved using Manual Pressure.  Plan: Bed rest for 1 hours.  See detailed procedure note with images in PACS. The patient tolerated the procedure well without incident or complication and was returned to Recovery in stable condition.    Michaelle Birks, MD Vascular and Interventional Radiology Specialists [facility] Radiology   Pager. [place]

## 2021-03-10 NOTE — Progress Notes (Signed)
Rowe Robert, PA viewed right low back dressing prior to discharge (changed dressing prior to discharge).

## 2021-03-10 NOTE — Consult Note (Addendum)
Chief Complaint: Patient was seen in consultation today for  CT guided bone marrow biopsy   Referring Physician(s): [place]  Supervising Physician: Michaelle Birks  Patient Status: [facility] - Out-pt  History of Present Illness: Vincent Carter is a 76 y.o. male ex smoker with past medical history significant for arthritis, coronary artery disease with prior stenting/MI/CABG, CHF, COPD, CKD, diverticulosis, prior cerebral infarcts, nephrolithiasis, HTN, B12 def, peripheral vascular disease, and prostate cancer who presents now with persistent anemia of uncertain etiology along with 6% myeloblast population on flow cytometry.  He also has rising WBC and persistent thrombocytopenia .He is scheduled today for CT-guided bone marrow biopsy for further evaluation/rule out MDS/AML.   Past Medical History:  Diagnosis Date   Arthritis    ASCVD (arteriosclerotic cardiovascular disease) cardiologist--  is now dr Roderic Palau branch (was dr [name])    CABG in 1997 -- LIMA to LAD;  cath 07-17-2012 PCI and BMStenting;   RE-DO CABG X4  in  07-24-2004 three-vessel disease; cath in 08-24-2004 post CABG ef 40-45% w/ anteroapical hypokinesis; occlusion SVG  to  RI; patent RIMA to LAD; unsuccessful PCI in totally occluded RI graft.;  last myoview 09-09-2013, no ischemia, moderate inferior wall defect, ef 49%   CHF (congestive heart failure) (HCC)    Chronic obstructive pulmonary disease ([place])    Diverticulosis of colon    Euthyroid goiter    with low TSH    History of colon polyps    hyperplastic 2011   History of diverticulitis    History of kidney stones    History of ST elevation myocardial infarction (STEMI)    07-17-2004   w/ extensive RV infact per cath report   HOH (hard of hearing)    no hearing aid   Hypertension    Low TSH level    Clinically euthyroid   Peripheral vascular disease (HCC)    Decreased distal pulses and abnormal Doppler   Prostate cancer [facility]) urologist--  dr Irine Seal   T1c, Gleason 3+4=7,  PSA 4.71,  vol 38cc   Ptosis    Left, secondary to injury   S/p bare metal coronary artery stent    x3  to RCA  07-17-2004    Past Surgical History:  Procedure Laterality Date   [place]  08-24-2004  dr Lia Foyer   post re-do CABG/  Unsuccessful attempt PCI of clotted SVG to RI/  anteroapical segment severely hypokinetic,  ef 40-45%/  fresh occlusion SVG to RI,  continued patency of SVG to PAD and RIMA to LAD /   dLM 80%   CARDIOVASCULAR STRESS TEST  09-09-2013  dr Roderic Palau branch   no reversible ischemia,  moderately large defect noted in the inferior wall extending from  mid ventricle to apex also involves true ventricle apex and anteroapical wall/  hypokinesis of inferior wall in region of prior infarct/scarring/  LVEF 49%   CATARACT EXTRACTION W/ INTRAOCULAR LENS  IMPLANT, BILATERAL  2006   COLONOSCOPY W/ POLYPECTOMY  12-26-2009   CORONARY ANGIOPLASTY WITH STENT PLACEMENT  07-17-2004   dr Sabino Snipes   PCI w/ BMS x3 to RCA and balloon pump placement/  LVSF after revascularization , ef 65% w/ mild inferior hypokinesis/  dLM  70% extending into origins of LAD (70% ostial, 80% mid, D1 branch 90% ostial)  CFX (80% ostial OM, 70% after first branch) /   99% mRCA stented and no residual;  remains 80% origin of posterior LV branch/  most apical portion of PDA was embolized    CORONARY ARTERY BYPASS GRAFT  1997   dr Merleen Nicely   LIMA to LAD   CORONARY ARTERY BYPASS GRAFT  07-24-2004  dr gerhardt   RIMA to LAD,  SVG to OM,  SVG to PDA,  SVG to RI   EYE SURGERY Left 1996   repair ocular injury   ORIF RIGHT TIB-FIB FX  1990's   PROSTATE BIOPSY     RADIOACTIVE SEED IMPLANT N/A 05/02/2014   Procedure: RADIOACTIVE SEED IMPLANT;  Surgeon: Irine Seal, MD;  Location: [facility];  Service: Urology;  Laterality: N/A;   TONSILLECTOMY  age 31   TOTAL HIP ARTHROPLASTY Left 1983   RECONSTRUCTION/ THA/  SKIN AND BONE  GRAFTS--  pt fell from 5 stories off [place]   TRANSTHORACIC ECHOCARDIOGRAM  [id number]   diastolic dysfunction/  ef 45-50%/  mild AV calcification without stenosis,  mild AR/  mild LAE and RAE/  trivial MR and TR      Allergies: Augmentin [amoxicillin-pot clavulanate], Codeine, and Statins  Medications: Prior to Admission medications   Medication Sig Start Date End Date Taking? Authorizing Provider  carvedilol (COREG) 12.5 MG tablet Take 1 tablet (12.5 mg total) by mouth 2 (two) times daily with a meal. 07/18/20  Yes Gonfa, Charlesetta Ivory, MD  clindamycin (CLEOCIN) 300 MG capsule Take 1 capsule (300 mg total) by mouth 4 (four) times daily. X 7 days 02/25/21  Yes Sherwood Gambler, MD  dextromethorphan-guaiFENesin [facility] DM) 30-600 MG 12hr tablet Take 1 tablet by mouth 2 (two) times daily. Patient taking differently: Take 1 tablet by mouth 2 (two) times daily as needed. 03/30/18  Yes Kathie Dike, MD  fluticasone (FLONASE) 50 MCG/ACT nasal spray Place 2 sprays into both nostrils daily. 02/25/21  Yes Sherwood Gambler, MD  furosemide (LASIX) 20 MG tablet Take 1 tablet (20 mg total) by mouth daily. 07/22/20 07/22/21 Yes Mercy Riding, MD  hydrochlorothiazide (HYDRODIURIL) 25 MG tablet Take 25 mg by mouth daily. 02/23/21  Yes [provider]  lisinopril (ZESTRIL) 2.5 MG tablet Take 1 tablet (2.5 mg total) by mouth daily. 07/22/20  Yes Mercy Riding, MD  montelukast (SINGULAIR) 10 MG tablet TAKE 1 TABLET BY MOUTH AT BEDTIME 09/28/20  Yes Chesley Mires, MD  predniSONE (DELTASONE) 10 MG tablet Take q day 6,5,4,3,2,1 02/28/21  Yes Daleen Bo, MD  SYMBICORT 80-4.5 MCG/ACT inhaler SMARTSIG:2 Puff(s) Via Inhaler Morning-Evening 11/10/20  Yes [provider]  warfarin (COUMADIN) 2.5 MG tablet Take 1.5 tablets (3.75 mg total) every Monday, Wednesday, Friday, and Saturday, and 1 tablet (2.5 mg) on other days 01/08/21  Yes Branch, Alphonse Guild, MD  ipratropium-albuterol (DUONEB) 0.5-2.5 (3) MG/3ML SOLN  Take 3 mLs by nebulization every 6 (six) hours as needed. 06/27/20   Chesley Mires, MD  Nebulizer MISC Please provide 1 nebulizer machine and all supplies. Diagnosis: J44.1, Office number: [phone] 03/30/18   Kathie Dike, MD  nitroGLYCERIN (NITROSTAT) 0.4 MG SL tablet PLACE 1 TABLET UNDER TONGUE EVERY 5 MINUTES AS NEEDED FOR CHEST PAIN. 03/07/20   Arnoldo Lenis, MD  Omega-3 Fatty Acids (FISH OIL) 300 MG CAPS Take 2 capsules by mouth 2 (two) times daily.    [provider]  potassium chloride SA (KLOR-CON M) 20 MEQ tablet Take 1 tablet (20 mEq total) by mouth 2 (two) times daily for 3 days. 02/25/21 02/28/21  Sherwood Gambler, MD     Family History  Problem Relation Age of Onset  Clotting disorder Mother    Heart attack Mother    Lung cancer Father    Cancer Brother        prostate    Social History   Socioeconomic History   Marital status: Widowed    Spouse name: Not on file   Number of children: Not on file   Years of education: Not on file   Highest education level: Not on file  Occupational History   Occupation: Retired  Tobacco Use   Smoking status: Former    Packs/day: 1.00    Years: 55.00    Pack years: 55.00    Types: Cigarettes    Start date: 08/13/1958    Quit date: 04/12/2018    Years since quitting: 2.9   Smokeless tobacco: Never  Vaping Use   Vaping Use: Former  Substance and Sexual Activity   Alcohol use: No    Alcohol/week: 0.0 standard drinks   Drug use: No   Sexual activity: Not on file  Other Topics Concern   Not on file  Social History Narrative   Widowed   No regular exercise   Social Determinants of Health   Financial Resource Strain: Not on file  Food Insecurity: Not on file  Transportation Needs: Not on file  Physical Activity: Not on file  Stress: Not on file  Social Connections: Not on file      Review of Systems currently denies fever, headache, chest pain, abdominal/back pain, nausea, vomiting or bleeding.  He does  have chronic dyspnea and occasional cough.  Vital Signs: BP (!) 113/54    Pulse (!) 59    Temp 98.4 F (36.9 C) (Oral)    Resp 16    Wt 155 lb 11.2 oz (70.6 kg)    SpO2 100%    BMI 29.78 kg/m   Physical Exam awake, alert.  Chest with few scattered expiratory wheezes.  Heart with bradycardic but regular rhythm.  Abdomen soft, positive bowel sounds, nontender.  Extremities with full range of motion.  Imaging: DG Chest Port 1 View  Result Date: 02/28/2021 CLINICAL DATA:  Chest pain and shortness of breath. EXAM: PORTABLE CHEST 1 VIEW COMPARISON:  February 25, 2021 FINDINGS: Multiple sternal wires are noted. Stable mild to moderate severity linear scarring and/or atelectasis is seen within the bilateral lung bases. There is no evidence of a pleural effusion or pneumothorax. The heart size and mediastinal contours are within normal limits. Degenerative changes seen throughout the thoracic spine. IMPRESSION: Stable mild to moderate severity bibasilar linear scarring and/or atelectasis. Electronically Signed   By: Virgina Norfolk M.D.   On: 02/28/2021 22:39   DG Chest Port 1 View  Result Date: 02/25/2021 CLINICAL DATA:  Shortness of breath and cough since December. EXAM: PORTABLE CHEST 1 VIEW COMPARISON:  07/16/2020 FINDINGS: Previous median sternotomy and CABG procedure. Stable cardiomediastinal contours. Unchanged appearance of left midlung scarring. Mild subsegmental atelectasis and volume loss within the right lung base is increased from previous exam. No signs of pleural effusion or interstitial edema. IMPRESSION: 1. Increase in subsegmental atelectasis and volume loss within the right lung base. 2. Stable left midlung scarring. Electronically Signed   By: Kerby Moors M.D.   On: 02/25/2021 09:38    Labs:  CBC: Recent Labs    07/17/20 0041 07/18/20 0201 02/25/21 0839 02/28/21 2208  WBC 5.5 4.3 11.2* 11.8*  HGB 17.1* 17.2* 10.6* 9.8*  HCT 51.9 53.0* 31.8* 29.8*  PLT 95* 114* 134* 135*     COAGS:  Recent Labs    07/15/20 1750 07/17/20 0041 01/08/21 0920 02/19/21 0923 02/25/21 0839 03/03/21 1500  INR 1.7*   < > 2.8 2.9 1.8* 2.8*  APTT 42*  --   --   --   --   --    < > = values in this interval not displayed.    BMP: Recent Labs    07/17/20 0041 07/18/20 0201 02/25/21 0839 02/28/21 2208  NA 135 138 134* 135  K 3.4* 4.0 3.0* 4.1  CL 105 109 102 108  CO2 23 23 21* 22  GLUCOSE 91 93 120* 119*  BUN 26* 23 30* 26*  CALCIUM 8.3* 8.4* 7.8* 8.0*  CREATININE 1.40* 1.21 1.53* 1.38*  GFRNONAA 52* >60 47* 53*    LIVER FUNCTION TESTS: Recent Labs    07/17/20 0041 07/18/20 0201 02/25/21 0839 02/28/21 2208  BILITOT 1.2 1.0 1.1 0.8  AST 22 20 10* 11*  ALT _0 ALKPHOS 43 40 48 50  PROT 5.6* 5.5* 7.0 6.9  ALBUMIN 3.2* 3.1* 3.2* 2.8*    TUMOR MARKERS: No results for input(s): AFPTM, CEA, CA199, CHROMGRNA in the last 8760 hours.  Assessment and Plan: 76 y.o. male ex smoker with past medical history significant for arthritis, coronary artery disease with prior stenting/MI/CABG, CHF, COPD, CKD, diverticulosis, prior cerebral infarcts, nephrolithiasis, HTN, B12 def, peripheral vascular disease, and prostate cancer who presents now with persistent anemia of uncertain etiology along with 6% myeloblast population on flow cytometry.  He also has rising WBC and persistent thrombocytopenia .He is scheduled today for CT-guided bone marrow biopsy for further evaluation/rule out MDS/AML. Risks and benefits of procedure was discussed with the patient  including, but not limited to bleeding, infection, damage to adjacent structures or low yield requiring additional tests.  All of the questions were answered and there is agreement to proceed.  Consent signed and in chart.    Thank you for this interesting consult.  I greatly enjoyed meeting KEELEN QUEVEDO and look forward to participating in their care.  A copy of this report was sent to the requesting provider  on this date.  Electronically Signed: D. Rowe Robert, PA-C 03/10/2021, 8:18 AM   I spent a total of  20 minutes   in face to face in clinical consultation, greater than 50% of which was counseling/coordinating care for CT guided bone marrow biopsy

## 2021-03-13 LAB — SURGICAL PATHOLOGY

## 2021-03-14 ENCOUNTER — Other Ambulatory Visit: Payer: Self-pay

## 2021-03-14 ENCOUNTER — Emergency Department (HOSPITAL_COMMUNITY): Payer: Medicare Other

## 2021-03-14 ENCOUNTER — Inpatient Hospital Stay (HOSPITAL_COMMUNITY)
Admission: EM | Admit: 2021-03-14 | Discharge: 2021-03-17 | DRG: 871 | Disposition: A | Discharge status: Home / Self Care | Payer: Medicare Other | Attending: Family Medicine | Admitting: Family Medicine

## 2021-03-14 ENCOUNTER — Encounter (HOSPITAL_COMMUNITY): Payer: Self-pay

## 2021-03-14 DIAGNOSIS — Z79899 Other long term (current) drug therapy: Secondary | ICD-10-CM | POA: Diagnosis not present

## 2021-03-14 DIAGNOSIS — I4891 Unspecified atrial fibrillation: Secondary | ICD-10-CM | POA: Diagnosis not present

## 2021-03-14 DIAGNOSIS — Z20822 Contact with and (suspected) exposure to covid-19: Secondary | ICD-10-CM | POA: Diagnosis not present

## 2021-03-14 DIAGNOSIS — M1990 Unspecified osteoarthritis, unspecified site: Secondary | ICD-10-CM | POA: Diagnosis present

## 2021-03-14 DIAGNOSIS — Z8249 Family history of ischemic heart disease and other diseases of the circulatory system: Secondary | ICD-10-CM

## 2021-03-14 DIAGNOSIS — Z880 Allergy status to penicillin: Secondary | ICD-10-CM

## 2021-03-14 DIAGNOSIS — J432 Centrilobular emphysema: Secondary | ICD-10-CM

## 2021-03-14 DIAGNOSIS — Z7952 Long term (current) use of systemic steroids: Secondary | ICD-10-CM | POA: Diagnosis not present

## 2021-03-14 DIAGNOSIS — C9200 Acute myeloblastic leukemia, not having achieved remission: Secondary | ICD-10-CM | POA: Diagnosis not present

## 2021-03-14 DIAGNOSIS — Z885 Allergy status to narcotic agent status: Secondary | ICD-10-CM

## 2021-03-14 DIAGNOSIS — J439 Emphysema, unspecified: Secondary | ICD-10-CM | POA: Diagnosis not present

## 2021-03-14 DIAGNOSIS — I110 Hypertensive heart disease with heart failure: Secondary | ICD-10-CM | POA: Diagnosis present

## 2021-03-14 DIAGNOSIS — I2510 Atherosclerotic heart disease of native coronary artery without angina pectoris: Secondary | ICD-10-CM | POA: Diagnosis not present

## 2021-03-14 DIAGNOSIS — I739 Peripheral vascular disease, unspecified: Secondary | ICD-10-CM | POA: Diagnosis not present

## 2021-03-14 DIAGNOSIS — D72823 Leukemoid reaction: Secondary | ICD-10-CM

## 2021-03-14 DIAGNOSIS — Z87442 Personal history of urinary calculi: Secondary | ICD-10-CM

## 2021-03-14 DIAGNOSIS — E876 Hypokalemia: Secondary | ICD-10-CM | POA: Diagnosis present

## 2021-03-14 DIAGNOSIS — Z888 Allergy status to other drugs, medicaments and biological substances status: Secondary | ICD-10-CM

## 2021-03-14 DIAGNOSIS — R079 Chest pain, unspecified: Secondary | ICD-10-CM | POA: Diagnosis not present

## 2021-03-14 DIAGNOSIS — I480 Paroxysmal atrial fibrillation: Secondary | ICD-10-CM | POA: Diagnosis present

## 2021-03-14 DIAGNOSIS — I5023 Acute on chronic systolic (congestive) heart failure: Secondary | ICD-10-CM | POA: Diagnosis present

## 2021-03-14 DIAGNOSIS — Z7951 Long term (current) use of inhaled steroids: Secondary | ICD-10-CM | POA: Diagnosis not present

## 2021-03-14 DIAGNOSIS — Z9861 Coronary angioplasty status: Secondary | ICD-10-CM

## 2021-03-14 DIAGNOSIS — Z8546 Personal history of malignant neoplasm of prostate: Secondary | ICD-10-CM

## 2021-03-14 DIAGNOSIS — Z955 Presence of coronary angioplasty implant and graft: Secondary | ICD-10-CM

## 2021-03-14 DIAGNOSIS — I639 Cerebral infarction, unspecified: Secondary | ICD-10-CM | POA: Diagnosis present

## 2021-03-14 DIAGNOSIS — I159 Secondary hypertension, unspecified: Secondary | ICD-10-CM

## 2021-03-14 DIAGNOSIS — H9190 Unspecified hearing loss, unspecified ear: Secondary | ICD-10-CM | POA: Diagnosis not present

## 2021-03-14 DIAGNOSIS — C9590 Leukemia, unspecified not having achieved remission: Secondary | ICD-10-CM | POA: Diagnosis not present

## 2021-03-14 DIAGNOSIS — E860 Dehydration: Secondary | ICD-10-CM | POA: Diagnosis present

## 2021-03-14 DIAGNOSIS — Z8673 Personal history of transient ischemic attack (TIA), and cerebral infarction without residual deficits: Secondary | ICD-10-CM

## 2021-03-14 DIAGNOSIS — D696 Thrombocytopenia, unspecified: Secondary | ICD-10-CM | POA: Diagnosis present

## 2021-03-14 DIAGNOSIS — N179 Acute kidney failure, unspecified: Secondary | ICD-10-CM | POA: Diagnosis present

## 2021-03-14 DIAGNOSIS — I252 Old myocardial infarction: Secondary | ICD-10-CM | POA: Diagnosis not present

## 2021-03-14 DIAGNOSIS — D6959 Other secondary thrombocytopenia: Secondary | ICD-10-CM | POA: Diagnosis present

## 2021-03-14 DIAGNOSIS — Z87891 Personal history of nicotine dependence: Secondary | ICD-10-CM

## 2021-03-14 DIAGNOSIS — A419 Sepsis, unspecified organism: Principal | ICD-10-CM | POA: Diagnosis present

## 2021-03-14 DIAGNOSIS — I10 Essential (primary) hypertension: Secondary | ICD-10-CM | POA: Diagnosis present

## 2021-03-14 DIAGNOSIS — Z96642 Presence of left artificial hip joint: Secondary | ICD-10-CM | POA: Diagnosis present

## 2021-03-14 DIAGNOSIS — E785 Hyperlipidemia, unspecified: Secondary | ICD-10-CM | POA: Diagnosis present

## 2021-03-14 DIAGNOSIS — D72829 Elevated white blood cell count, unspecified: Secondary | ICD-10-CM | POA: Diagnosis present

## 2021-03-14 DIAGNOSIS — I517 Cardiomegaly: Secondary | ICD-10-CM | POA: Diagnosis not present

## 2021-03-14 DIAGNOSIS — I959 Hypotension, unspecified: Secondary | ICD-10-CM | POA: Diagnosis present

## 2021-03-14 DIAGNOSIS — Z7901 Long term (current) use of anticoagulants: Secondary | ICD-10-CM

## 2021-03-14 DIAGNOSIS — C9500 Acute leukemia of unspecified cell type not having achieved remission: Secondary | ICD-10-CM | POA: Diagnosis not present

## 2021-03-14 DIAGNOSIS — R0989 Other specified symptoms and signs involving the circulatory and respiratory systems: Secondary | ICD-10-CM | POA: Diagnosis not present

## 2021-03-14 DIAGNOSIS — C61 Malignant neoplasm of prostate: Secondary | ICD-10-CM | POA: Diagnosis present

## 2021-03-14 DIAGNOSIS — K5730 Diverticulosis of large intestine without perforation or abscess without bleeding: Secondary | ICD-10-CM | POA: Diagnosis not present

## 2021-03-14 DIAGNOSIS — J9811 Atelectasis: Secondary | ICD-10-CM | POA: Diagnosis not present

## 2021-03-14 DIAGNOSIS — R6520 Severe sepsis without septic shock: Secondary | ICD-10-CM | POA: Diagnosis not present

## 2021-03-14 DIAGNOSIS — Z951 Presence of aortocoronary bypass graft: Secondary | ICD-10-CM

## 2021-03-14 LAB — COMPREHENSIVE METABOLIC PANEL
ALT: 21 U/L (ref 0–44)
AST: 18 U/L (ref 15–41)
Albumin: 2.8 g/dL — ABNORMAL LOW (ref 3.5–5.0)
Alkaline Phosphatase: 48 U/L (ref 38–126)
Anion gap: 6 (ref 5–15)
BUN: 35 mg/dL — ABNORMAL HIGH (ref 8–23)
CO2: 22 mmol/L (ref 22–32)
Calcium: 8.1 mg/dL — ABNORMAL LOW (ref 8.9–10.3)
Chloride: 106 mmol/L (ref 98–111)
Creatinine, Ser: 2.11 mg/dL — ABNORMAL HIGH (ref 0.61–1.24)
GFR, Estimated: 32 mL/min — ABNORMAL LOW (ref >60)
Glucose, Bld: 141 mg/dL — ABNORMAL HIGH (ref 70–99)
Potassium: 3.5 mmol/L (ref 3.5–5.1)
Sodium: 134 mmol/L — ABNORMAL LOW (ref 135–145)
Total Bilirubin: 1.5 mg/dL — ABNORMAL HIGH (ref 0.3–1.2)
Total Protein: 6.6 g/dL (ref 6.5–8.1)

## 2021-03-14 LAB — URINALYSIS, ROUTINE W REFLEX MICROSCOPIC
Bilirubin Urine: NEGATIVE
Glucose, UA: NEGATIVE mg/dL
Hgb urine dipstick: NEGATIVE
Ketones, ur: NEGATIVE mg/dL
Leukocytes,Ua: NEGATIVE
Nitrite: NEGATIVE
Protein, ur: NEGATIVE mg/dL
Specific Gravity, Urine: 1.019 (ref 1.005–1.030)
pH: 5 (ref 5.0–8.0)

## 2021-03-14 LAB — LACTIC ACID, PLASMA
Lactic Acid, Venous: 3.4 mmol/L (ref 0.5–1.9)
Lactic Acid, Venous: 2.6 mmol/L (ref 0.5–1.9)

## 2021-03-14 LAB — CBC WITH DIFFERENTIAL/PLATELET
Basophils Absolute: 0 10*3/uL (ref 0.0–0.1)
Basophils Relative: 0 %
Blasts: 2 %
Eosinophils Absolute: 0 10*3/uL (ref 0.0–0.5)
Eosinophils Relative: 0 %
HCT: 26.6 % — ABNORMAL LOW (ref 39.0–52.0)
Hemoglobin: 8.6 g/dL — ABNORMAL LOW (ref 13.0–17.0)
Lymphocytes Relative: 31 %
Lymphs Abs: 10.3 10*3/uL — ABNORMAL HIGH (ref 0.7–4.0)
MCH: 34.8 pg — ABNORMAL HIGH (ref 26.0–34.0)
MCHC: 32.3 g/dL (ref 30.0–36.0)
MCV: 107.7 fL — ABNORMAL HIGH (ref 80.0–100.0)
Monocytes Absolute: 21.6 10*3/uL — ABNORMAL HIGH (ref 0.1–1.0)
Monocytes Relative: 65 %
Neutro Abs: 0.7 10*3/uL — ABNORMAL LOW (ref 1.7–7.7)
Neutrophils Relative %: 2 %
Platelets: 67 10*3/uL — ABNORMAL LOW (ref 150–400)
RBC: 2.47 MIL/uL — ABNORMAL LOW (ref 4.22–5.81)
RDW: 19.6 % — ABNORMAL HIGH (ref 11.5–15.5)
WBC: 33.2 10*3/uL — ABNORMAL HIGH (ref 4.0–10.5)
nRBC: 0 % (ref 0.0–0.2)

## 2021-03-14 LAB — RESP PANEL BY RT-PCR (FLU A&B, COVID) ARPGX2
Influenza A by PCR: NEGATIVE
Influenza B by PCR: NEGATIVE
SARS Coronavirus 2 by RT PCR: NEGATIVE

## 2021-03-14 LAB — TROPONIN I (HIGH SENSITIVITY)
Troponin I (High Sensitivity): 34 ng/L — ABNORMAL HIGH (ref <18)
Troponin I (High Sensitivity): 40 ng/L — ABNORMAL HIGH (ref <18)

## 2021-03-14 LAB — CORTISOL: Cortisol, Plasma: 17.2 ug/dL

## 2021-03-14 LAB — PROTIME-INR
INR: 2.1 — ABNORMAL HIGH (ref 0.8–1.2)
Prothrombin Time: 23.8 seconds — ABNORMAL HIGH (ref 11.4–15.2)

## 2021-03-14 LAB — FIBRINOGEN: Fibrinogen: 511 mg/dL — ABNORMAL HIGH (ref 210–475)

## 2021-03-14 LAB — LACTATE DEHYDROGENASE: LDH: 174 U/L (ref 98–192)

## 2021-03-14 LAB — APTT: aPTT: 49 seconds — ABNORMAL HIGH (ref 24–36)

## 2021-03-14 LAB — BRAIN NATRIURETIC PEPTIDE: B Natriuretic Peptide: 146 pg/mL — ABNORMAL HIGH (ref 0.0–100.0)

## 2021-03-14 LAB — URIC ACID: Uric Acid, Serum: 10.8 mg/dL — ABNORMAL HIGH (ref 3.7–8.6)

## 2021-03-14 MED ORDER — SODIUM CHLORIDE 0.9 % IV SOLN: 2.0000 g | Freq: Once | INTRAVENOUS | Status: DC

## 2021-03-14 MED ORDER — SODIUM CHLORIDE 0.9 % IV BOLUS (SEPSIS)
500.0000 mL | Freq: Once | INTRAVENOUS | Status: AC
  Administered 2021-03-14: 500 mL via INTRAVENOUS

## 2021-03-14 MED ORDER — NITROGLYCERIN 0.4 MG SL SUBL: 0.4000 mg | SUBLINGUAL_TABLET | SUBLINGUAL | Status: DC | PRN

## 2021-03-14 MED ORDER — ONDANSETRON HCL 4 MG PO TABS: 4.0000 mg | ORAL_TABLET | Freq: Four times a day (QID) | ORAL | Status: DC | PRN

## 2021-03-14 MED ORDER — HYDROMORPHONE HCL 1 MG/ML IJ SOLN: 0.5000 mg | INTRAMUSCULAR | Status: DC | PRN

## 2021-03-14 MED ORDER — VANCOMYCIN HCL IN DEXTROSE 1-5 GM/200ML-% IV SOLN: 1000.0000 mg | Freq: Once | INTRAVENOUS | Status: DC

## 2021-03-14 MED ORDER — VANCOMYCIN HCL IN DEXTROSE 1-5 GM/200ML-% IV SOLN
1000.0000 mg | Freq: Once | INTRAVENOUS | Status: DC
  Administered 2021-03-14: 1000 mg via INTRAVENOUS
  Filled 2021-03-14: qty 200

## 2021-03-14 MED ORDER — CARVEDILOL 12.5 MG PO TABS
12.5000 mg | ORAL_TABLET | Freq: Two times a day (BID) | ORAL | Status: DC
Start: 2021-03-14 — End: 2021-03-17
  Administered 2021-03-15 – 2021-03-17 (×4): 12.5 mg via ORAL
  Filled 2021-03-14 (×4): qty 1

## 2021-03-14 MED ORDER — MONTELUKAST SODIUM 10 MG PO TABS
10.0000 mg | ORAL_TABLET | Freq: Every day | ORAL | Status: DC
  Administered 2021-03-14 – 2021-03-16 (×3): 10 mg via ORAL
  Filled 2021-03-14 (×3): qty 1

## 2021-03-14 MED ORDER — VANCOMYCIN HCL 1500 MG/300ML IV SOLN
1500.0000 mg | Freq: Once | INTRAVENOUS | Status: DC
  Filled 2021-03-14: qty 300

## 2021-03-14 MED ORDER — SODIUM CHLORIDE 0.9 % IV SOLN: INTRAVENOUS | Status: AC

## 2021-03-14 MED ORDER — SODIUM CHLORIDE 0.9 % IV SOLN
2.0000 g | Freq: Once | INTRAVENOUS | Status: AC
  Administered 2021-03-14: 2 g via INTRAVENOUS
  Filled 2021-03-14: qty 20

## 2021-03-14 MED ORDER — HEPARIN SODIUM (PORCINE) 5000 UNIT/ML IJ SOLN
5000.0000 [IU] | Freq: Three times a day (TID) | INTRAMUSCULAR | Status: DC
  Administered 2021-03-14 – 2021-03-15 (×2): 5000 [IU] via SUBCUTANEOUS
  Filled 2021-03-14 (×2): qty 1

## 2021-03-14 MED ORDER — LEVALBUTEROL HCL 0.63 MG/3ML IN NEBU
0.6300 mg | INHALATION_SOLUTION | Freq: Four times a day (QID) | RESPIRATORY_TRACT | Status: DC | PRN
  Administered 2021-03-15: 0.63 mg via RESPIRATORY_TRACT
  Filled 2021-03-14: qty 3

## 2021-03-14 MED ORDER — ACETAMINOPHEN 650 MG RE SUPP: 650.0000 mg | Freq: Four times a day (QID) | RECTAL | Status: DC | PRN

## 2021-03-14 MED ORDER — SODIUM CHLORIDE 0.9 % IV BOLUS
500.0000 mL | Freq: Once | INTRAVENOUS | Status: AC
  Administered 2021-03-14: 500 mL via INTRAVENOUS

## 2021-03-14 MED ORDER — LACTATED RINGERS IV BOLUS (SEPSIS)
1000.0000 mL | Freq: Once | INTRAVENOUS | Status: AC
  Administered 2021-03-14: 1000 mL via INTRAVENOUS

## 2021-03-14 MED ORDER — SODIUM CHLORIDE 0.9 % IV SOLN
2.0000 g | INTRAVENOUS | Status: DC
  Administered 2021-03-15: 2 g via INTRAVENOUS
  Filled 2021-03-14: qty 20

## 2021-03-14 MED ORDER — ACETAMINOPHEN 325 MG PO TABS: 650.0000 mg | ORAL_TABLET | Freq: Four times a day (QID) | ORAL | Status: DC | PRN

## 2021-03-14 MED ORDER — METRONIDAZOLE 500 MG/100ML IV SOLN
500.0000 mg | Freq: Once | INTRAVENOUS | Status: AC
  Administered 2021-03-14: 500 mg via INTRAVENOUS
  Filled 2021-03-14: qty 100

## 2021-03-14 MED ORDER — SENNOSIDES-DOCUSATE SODIUM 8.6-50 MG PO TABS: 1.0000 | ORAL_TABLET | Freq: Every evening | ORAL | Status: DC | PRN

## 2021-03-14 MED ORDER — SODIUM CHLORIDE 0.9% FLUSH
3.0000 mL | Freq: Two times a day (BID) | INTRAVENOUS | Status: DC
  Administered 2021-03-14 – 2021-03-17 (×4): 3 mL via INTRAVENOUS

## 2021-03-14 MED ORDER — PIPERACILLIN-TAZOBACTAM 3.375 G IVPB 30 MIN
3.3750 g | Freq: Once | INTRAVENOUS | Status: DC
  Filled 2021-03-14: qty 50

## 2021-03-14 MED ORDER — HYDRALAZINE HCL 20 MG/ML IJ SOLN: 10.0000 mg | INTRAMUSCULAR | Status: DC | PRN

## 2021-03-14 MED ORDER — LACTATED RINGERS IV BOLUS (SEPSIS): 250.0000 mL | Freq: Once | INTRAVENOUS | Status: DC

## 2021-03-14 MED ORDER — SODIUM CHLORIDE 0.9% FLUSH: 3.0000 mL | INTRAVENOUS | Status: DC | PRN

## 2021-03-14 MED ORDER — METRONIDAZOLE 500 MG/100ML IV SOLN
500.0000 mg | Freq: Two times a day (BID) | INTRAVENOUS | Status: DC
  Administered 2021-03-15 – 2021-03-16 (×3): 500 mg via INTRAVENOUS
  Filled 2021-03-14 (×3): qty 100

## 2021-03-14 MED ORDER — FLUTICASONE PROPIONATE 50 MCG/ACT NA SUSP
2.0000 | Freq: Every day | NASAL | Status: DC
  Administered 2021-03-14 – 2021-03-17 (×4): 2 via NASAL
  Filled 2021-03-14: qty 16

## 2021-03-14 MED ORDER — OXYCODONE HCL 5 MG PO TABS: 5.0000 mg | ORAL_TABLET | ORAL | Status: DC | PRN

## 2021-03-14 MED ORDER — ONDANSETRON HCL 4 MG/2ML IJ SOLN
4.0000 mg | Freq: Four times a day (QID) | INTRAMUSCULAR | Status: DC | PRN
  Administered 2021-03-17: 4 mg via INTRAVENOUS
  Filled 2021-03-14: qty 2

## 2021-03-14 MED ORDER — SODIUM CHLORIDE 0.9 % IV SOLN: 250.0000 mL | INTRAVENOUS | Status: DC | PRN

## 2021-03-14 MED ORDER — IPRATROPIUM BROMIDE 0.02 % IN SOLN
0.5000 mg | Freq: Four times a day (QID) | RESPIRATORY_TRACT | Status: DC | PRN
  Administered 2021-03-15: 0.5 mg via RESPIRATORY_TRACT
  Filled 2021-03-14: qty 2.5

## 2021-03-14 MED ORDER — VANCOMYCIN HCL 1250 MG/250ML IV SOLN
1250.0000 mg | INTRAVENOUS | Status: DC
Start: 2021-03-16 — End: 2021-03-15

## 2021-03-14 MED ORDER — FLUTICASONE FUROATE-VILANTEROL 100-25 MCG/ACT IN AEPB
1.0000 | INHALATION_SPRAY | Freq: Every day | RESPIRATORY_TRACT | Status: DC
  Administered 2021-03-15 – 2021-03-17 (×3): 1 via RESPIRATORY_TRACT
  Filled 2021-03-14 (×2): qty 28

## 2021-03-14 MED ORDER — SODIUM CHLORIDE 0.9% FLUSH
3.0000 mL | Freq: Two times a day (BID) | INTRAVENOUS | Status: DC
  Administered 2021-03-14 – 2021-03-17 (×5): 3 mL via INTRAVENOUS

## 2021-03-14 MED ORDER — TRAZODONE HCL 50 MG PO TABS
25.0000 mg | ORAL_TABLET | Freq: Every evening | ORAL | Status: DC | PRN
Start: 2021-03-14 — End: 2021-03-17

## 2021-03-14 MED ORDER — SODIUM CHLORIDE 0.9 % IV BOLUS
1500.0000 mL | Freq: Once | INTRAVENOUS | Status: AC
  Administered 2021-03-14: 1500 mL via INTRAVENOUS

## 2021-03-14 MED ORDER — BISACODYL 5 MG PO TBEC: 5.0000 mg | DELAYED_RELEASE_TABLET | Freq: Every day | ORAL | Status: DC | PRN

## 2021-03-14 NOTE — ED Provider Notes (Addendum)
[facility] EMERGENCY DEPARTMENT Provider Note   CSN: [id number] Arrival date & time: 03/14/21  1153     History  Chief Complaint  Patient presents with   Weakness   Chest Pain    Vincent Carter is a 76 y.o. male.   Weakness Associated symptoms: chest pain   Chest Pain Associated symptoms: weakness    This patient is a 76 year old male, he has a history of prior hypertension on carvedilol and hydrochlorothiazide and lisinopril, he has a history of coronary disease, history of being on Coumadin.  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  A CBC that was performed 4 days ago showed a white blood cell count of 23,000 a hemoglobin of 9.9 and platelets of 79,000.  According to the patient and his daughter who are the primary historians the patient was in his usual state of health up until last night, started to have some chest pain, he had recently been treated for some type of respiratory illness, this is verified in the medical record based on ER visit from about 3 weeks ago.  He slept with a heating pad on his chest because of the pain he felt when he woke up this morning that he was okay.  He went to eat breakfast and was found to be altered while eating, lightheaded, dizzy and hypotensive with a blood pressure of 80/50 on arrival.  The patient was mildly tachypneic but denies any chest pain at this time, family members report that he has been confused and not answering questions appropriately today.  Home Medications Prior to Admission medications   Medication Sig Start Date End Date Taking? Authorizing Provider  carvedilol (COREG) 12.5 MG tablet Take 1 tablet (12.5 mg total) by mouth 2 (two) times daily with a meal. 07/18/20   Mercy Riding, MD  clindamycin (CLEOCIN) 300 MG capsule Take 1 capsule (300  mg total) by mouth 4 (four) times daily. X 7 days 02/25/21   Sherwood Gambler, MD  dextromethorphan-guaiFENesin [facility] DM) 30-600 MG 12hr tablet Take 1 tablet by mouth 2 (two) times daily. Patient taking differently: Take 1 tablet by mouth 2 (two) times daily as needed. 03/30/18   Kathie Dike, MD  fluticasone (FLONASE) 50 MCG/ACT nasal spray Place 2 sprays into both nostrils daily. 02/25/21   Sherwood Gambler, MD  furosemide (LASIX) 20 MG tablet Take 1 tablet (20 mg total) by mouth daily. 07/22/20 07/22/21  Mercy Riding, MD  hydrochlorothiazide (HYDRODIURIL) 25 MG tablet Take 25 mg by mouth daily. 02/23/21   [provider]  ipratropium-albuterol (DUONEB) 0.5-2.5 (3) MG/3ML SOLN Take 3 mLs by nebulization every 6 (six) hours as needed. 06/27/20   Chesley Mires, MD  lisinopril (ZESTRIL) 2.5 MG tablet Take 1 tablet (2.5 mg total) by mouth daily. 07/22/20   Mercy Riding, MD  montelukast (SINGULAIR) 10 MG tablet TAKE 1 TABLET BY MOUTH AT BEDTIME 09/28/20   Chesley Mires, MD  Nebulizer MISC Please provide 1 nebulizer machine and all supplies. Diagnosis: J44.1, Office number: [phone] 03/30/18   Kathie Dike, MD  nitroGLYCERIN (NITROSTAT) 0.4 MG SL tablet PLACE 1 TABLET UNDER TONGUE EVERY 5 MINUTES AS NEEDED FOR CHEST PAIN. 03/07/20   Arnoldo Lenis, MD  Omega-3 Fatty Acids (FISH OIL) 300 MG CAPS Take 2 capsules by mouth 2 (two) times daily.  [provider]  potassium chloride SA (KLOR-CON M) 20 MEQ tablet Take 1 tablet (20 mEq total) by mouth 2 (two) times daily for 3 days. 02/25/21 02/28/21  Sherwood Gambler, MD  predniSONE (DELTASONE) 10 MG tablet Take q day 6,5,4,3,2,1 02/28/21   Daleen Bo, MD  [facility] 80-4.5 MCG/ACT inhaler SMARTSIG:2 Puff(s) Via Inhaler Morning-Evening 11/10/20   [provider]  warfarin (COUMADIN) 2.5 MG tablet Take 1.5 tablets (3.75 mg total) every Monday, Wednesday, Friday, and Saturday, and 1 tablet (2.5 mg) on other days 01/08/21   Arnoldo Lenis, MD      Allergies    Augmentin [amoxicillin-pot clavulanate], Codeine, and Statins    Review of Systems   Review of Systems  Cardiovascular:  Positive for chest pain.  Neurological:  Positive for weakness.  All other systems reviewed and are negative.  Physical Exam Updated Vital Signs BP (!) 85/46    Pulse 66    Temp 97.6 F (36.4 C) (Oral)    Resp (!) 30    Ht 1.575 m (_0 )    Wt 71 kg    SpO2 96%    BMI 28.63 kg/m  Physical Exam Vitals and nursing note reviewed.  Constitutional:      General: He is not in acute distress.    Appearance: He is well-developed. He is ill-appearing (Weak and chronically ill-appearing, frail).  HENT:     Head: Normocephalic and atraumatic.     Mouth/Throat:     Pharynx: No oropharyngeal exudate.  Eyes:     General: No scleral icterus.       Right eye: No discharge.        Left eye: No discharge.     Conjunctiva/sclera: Conjunctivae normal.     Pupils: Pupils are equal, round, and reactive to light.  Neck:     Thyroid: No thyromegaly.     Vascular: No JVD.  Cardiovascular:     Rate and Rhythm: Regular rhythm. Bradycardia present.     Heart sounds: Normal heart sounds. No murmur heard.   No friction rub. No gallop.     Comments: Bradycardia, heart rate of 55 to 60 bpm Pulmonary:     Effort: Pulmonary effort is normal. No respiratory distress.     Breath sounds: Normal breath sounds. No wheezing or rales.  Abdominal:     General: Bowel sounds are normal. There is no distension.     Palpations: Abdomen is soft. There is no mass.     Tenderness: There is no abdominal tenderness.  Musculoskeletal:        General: No tenderness. Normal range of motion.     Cervical back: Normal range of motion and neck supple.     Right lower leg: No tenderness. No edema.     Left lower leg: No tenderness. No edema.  Lymphadenopathy:     Cervical: No cervical adenopathy.  Skin:    General: Skin is warm and dry.     Coloration: Skin is pale.      Findings: No erythema or rash.  Neurological:     Mental Status: He is alert.     Coordination: Coordination normal.     Comments: Answers questions, follows commands, seems to be mildly confused but able to move all 4 extremities symmetrically and has no facial droop  Psychiatric:        Behavior: Behavior normal.    ED Results / Procedures / Treatments   Labs (all labs ordered are listed, but  only abnormal results are displayed) Labs Reviewed  COMPREHENSIVE METABOLIC PANEL - Abnormal; Notable for the following components:      Result Value   Sodium 134 (*)    Glucose, Bld 141 (*)    BUN 35 (*)    Creatinine, Ser 2.11 (*)    Calcium 8.1 (*)    Albumin 2.8 (*)    Total Bilirubin 1.5 (*)    GFR, Estimated 32 (*)    All other components within normal limits  CBC WITH DIFFERENTIAL/PLATELET - Abnormal; Notable for the following components:   WBC 33.2 (*)    RBC 2.47 (*)    Hemoglobin 8.6 (*)    HCT 26.6 (*)    MCV 107.7 (*)    MCH 34.8 (*)    RDW 19.6 (*)    Platelets 67 (*)    Neutro Abs 0.7 (*)    Lymphs Abs 10.3 (*)    Monocytes Absolute 21.6 (*)    All other components within normal limits  PROTIME-INR - Abnormal; Notable for the following components:   Prothrombin Time 23.8 (*)    INR 2.1 (*)    All other components within normal limits  APTT - Abnormal; Notable for the following components:   aPTT 49 (*)    All other components within normal limits  RESP PANEL BY RT-PCR (FLU A&B, COVID) ARPGX2  CULTURE, BLOOD (ROUTINE X 2)  CULTURE, BLOOD (ROUTINE X 2)  URINE CULTURE  LACTIC ACID, PLASMA  LACTIC ACID, PLASMA  URINALYSIS, ROUTINE W REFLEX MICROSCOPIC  URIC ACID  LACTATE DEHYDROGENASE    EKG None  Radiology DG Chest Port 1 View  Result Date: 03/14/2021 CLINICAL DATA:  Questionable sepsis.  Chest pain. EXAM: PORTABLE CHEST 1 VIEW COMPARISON:  Chest radiograph dated February 28, 2021. FINDINGS: The heart is enlarged. Atherosclerotic calcification of  aortic arch. Evidence of prior coronary artery bypass grafting. Low lung volumes with bibasilar atelectasis. Bandlike atelectasis in the left mid lung is unchanged. IMPRESSION: 1. Stable cardiomegaly. 2. Low lung volumes with bandlike atelectasis in the left mid lung, unchanged. No evidence of pneumonia or large pleural effusion. Electronically Signed   By: Keane Police D.O.   On: 03/14/2021 13:19    Procedures .Critical Care Performed by: Noemi Chapel, MD Authorized by: Noemi Chapel, MD   Critical care provider statement:    Critical care time (minutes):  35   Critical care time was exclusive of:  Separately billable procedures and treating other patients and teaching time   Critical care was necessary to treat or prevent imminent or life-threatening deterioration of the following conditions:  Sepsis   Critical care was time spent personally by me on the following activities:  Development of treatment plan with patient or surrogate, discussions with consultants, evaluation of patient's response to treatment, examination of patient, ordering and review of laboratory studies, ordering and review of radiographic studies, ordering and performing treatments and interventions, pulse oximetry, re-evaluation of patient's condition, review of old charts and obtaining history from patient or surrogate   I assumed direction of critical care for this patient from another provider in my specialty: no     Care discussed with: admitting provider   Comments:          Medications Ordered in ED Medications  0.9 %  sodium chloride infusion (has no administration in time range)  vancomycin (VANCOCIN) IVPB 1000 mg/200 mL premix (has no administration in time range)  piperacillin-tazobactam (ZOSYN) IVPB 3.375 g (has no administration in  time range)  sodium chloride 0.9 % bolus 1,500 mL (has no administration in time range)  sodium chloride 0.9 % bolus 500 mL (500 mLs Intravenous Bolus 03/14/21 1309)   cefTRIAXone (ROCEPHIN) 2 g in sodium chloride 0.9 % 100 mL IVPB (2 g Intravenous New Bag/Given 03/14/21 1322)  metroNIDAZOLE (FLAGYL) IVPB 500 mg (500 mg Intravenous New Bag/Given 03/14/21 1307)  sodium chloride 0.9 % bolus 500 mL (0 mLs Intravenous Stopped 03/14/21 1304)    ED Course/ Medical Decision Making/ A&P                           Medical Decision Making Amount and/or Complexity of Data Reviewed Labs: ordered. Radiology: ordered. ECG/medicine tests: ordered.  Risk Prescription drug management. Decision regarding hospitalization.   EKG does not show any acute findings other than poor R wave progression, this patient is ill-appearing hypotensive and slightly bradycardic which is certainly concerning, labs of been ordered including a troponin, cultures, chest x-ray, IV fluids to resuscitate the hypotension and I anticipate the need for admission.  This could be related to an acute blast crisis as well   Co morbidities that complicate the patient evaluation  Acute leukemia, metastatic prostate cancer, hypertension, prior coronary disease   Additional history obtained:  Additional history obtained from electronic medical record as well as the family at the bedside External records from outside source obtained and reviewed including prior bone marrow biopsies and admissions to the hospital   Lab Tests:  I Ordered, and personally interpreted labs.  The pertinent results include: Significant leukocytosis, his blood counts have gone from 23,000-33,000 over the last 5 days, his metabolic panel shows that he has an acute kidney injury with a creatinine that is gone over 2.  His hemoglobin is dropped to 8.6 from 9.9.  His platelets have dropped to 67,000.   Imaging Studies ordered:  I ordered imaging studies including portable chest x-ray I independently visualized and interpreted imaging which showed atelectasis but no infiltrate I agree with the radiologist  interpretation   Cardiac Monitoring:  The patient was maintained on a cardiac monitor.  I personally viewed and interpreted the cardiac monitored which showed an underlying rhythm of: Normal sinus rhythm bordering on sinus bradycardia   Medicines ordered and prescription drug management:  I ordered medication including vancomycin and Zosyn as well as IV fluids for hydration for possible sepsis Reevaluation of the patient after these medicines showed that the patient improved I have reviewed the patients home medicines and have made adjustments as needed   Test Considered:  CT scan, after discussion with oncology this does not seem to be necessary   Critical Interventions:  IV fluids and antibiotics, blood cultures, lactic acid, admission to the hospital, this patient is critically ill   Consultations Obtained:  I requested consultation with the Dr. Delton Coombes with the oncology service,  and discussed lab and imaging findings as well as pertinent plan - they recommend: Admission to the hospital, they will consult.  They recommend 1 unit of blood packed red blood cells, hydration, antibiotics.   Dr. 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  A  total of 2500 cc of IV fluids were given      Final Clinical Impression(s) / ED Diagnoses Final diagnoses:  Hypotension, unspecified hypotension type  Acute kidney injury ([place])  Acute leukemia not having achieved remission [facility])   Noemi Chapel, MD 03/14/21 1414    Noemi Chapel, MD 03/15/21 [phone]

## 2021-03-14 NOTE — Assessment & Plan Note (Addendum)
-  Resolved ?- Likely due to sepsis, dehydration, hypotension ?-With holding BP meds ?-Status post IV fluid resuscitation ?-Avoiding nephrotoxin ? ?-BUN 35 /creatinine 2.11 >> 1.56 >>> 1.27, 1.24 ?

## 2021-03-14 NOTE — Assessment & Plan Note (Addendum)
-   Abnormal CBC with differential: Acute leukocytosis, thrombocytopenia ?-Recent work-up by oncologist Dr. Delton Coombes ?Including bone biopsy 04/07/2021 ?-ED provider Dr. Sabra Heck has discussed the case with Dr. Delton Coombes -he recommended supportive therapy IV fluids ruling out infection,  1 unit PRBC transfusion ?-If WBC > 50 K, consideration of treatment with hydroxyurea ? ?-Dr. Delton Coombes will be consulted appreciate his follow-up and input ?

## 2021-03-14 NOTE — Assessment & Plan Note (Addendum)
-   Acute on chronic, with recent diagnosis of leukemia ?-Ruling out sepsis-infection ?-With elevated lactic acid, will pursue with treatment for sepsis ?-We will monitor leukocytosis closely ? ?-ED provider Dr. Sabra Heck has discussed the case with Dr. Lorraine Lax if this is leukemoid reaction and if WBC gets > greater than 50 K then hydroxyurea treatment will be considered ? ?

## 2021-03-14 NOTE — Assessment & Plan Note (Addendum)
In remission ?-- PSA was 4.7. ?- Status post I-125 radioactive seed implantation on 05/02/2014. ?

## 2021-03-14 NOTE — Assessment & Plan Note (Addendum)
-   Currently hypotensive likely due to sepsis ?-We will holding home BP meds: Which includes lisinopril, HCTZ, Lasix, Coreg ?

## 2021-03-14 NOTE — Assessment & Plan Note (Signed)
-   Continue oxygen supplements, as needed DuoNeb bronchodilator treatment, ?-Home inhalers including Breo, Singulair,  will be continued ?

## 2021-03-14 NOTE — Assessment & Plan Note (Signed)
-   Currently hypotensive ?-Initiating IV fluids for sepsis physiology ?-Monitoring for fluid overload ?-We will monitor I's and O's, daily weight ?-Holding home dose Lasix ?-Last Echo: 03/24/2018 EJ EF 40-45%, mild LVH, A-fib, mild/moderate systolic dysfunction ?

## 2021-03-14 NOTE — ED Notes (Signed)
Blood cultures and lactic drawn at 1250 ?

## 2021-03-14 NOTE — Progress Notes (Signed)
Pharmacy Antibiotic Note ? ?Vincent Carter is a 76 y.o. male admitted on 03/14/2021 with  unknown source of infection .  Pharmacy has been consulted for Vancomycin dosing. ? ?Plan: ?Vancomycin '1500mg'$  IV loading dose then 1250 mg IV Q 48 hrs. Goal AUC 400-550. ?Expected AUC: 469 ?SCr used: 2.11  ?Ceftriaxone 2gm IV q24h ?Flagyl '500mg'$  IV q12h ?F/U cxs and clinical progress ?Monitor V/S, labs and levels as indicated ? ?Height: '5\' 2"'$  (157.5 cm) ?Weight: 71 kg (156 lb 8.4 oz) ?IBW/kg (Calculated) : 54.6 ? ?Temp (24hrs), Avg:97.6 ?F (36.4 ?C), Min:97.6 ?F (36.4 ?C), Max:97.6 ?F (36.4 ?C) ? ?Recent Labs  ?Lab 03/10/21 ?0800 03/14/21 ?1235  ?WBC 23.1* 33.2*  ?CREATININE  --  2.11*  ?LATICACIDVEN  --  3.4*  ?  ?Estimated Creatinine Clearance: 25.4 mL/min (A) (by C-G formula based on SCr of 2.11 mg/dL (H)).   ? ?Allergies  ?Allergen Reactions  ? Augmentin [Amoxicillin-Pot Clavulanate] Diarrhea and Nausea And Vomiting  ? Codeine Nausea Only  ?  Pill form of codeine causes nausea  ? Statins Other (See Comments)  ?  Myalgia   ? ? ?Antimicrobials this admission: ?Vancomycin  3/4 >>  ?ceftriaxone 3/4 >>  ?Flagyl 3/4>> ? ?Microbiology results: ?3/4 BCx: pending ?3/4 UCx: pending  ? MRSA PCR:  ? ?Thank you for allowing pharmacy to be a part of this patient?s care. ? ?Vincent Carter, BS Pharm D, BCPS ?Clinical Pharmacist ?Pager [phone] ?03/14/2021 2:52 PM ? ?

## 2021-03-14 NOTE — Assessment & Plan Note (Signed)
-   Likely associated with sepsis versus leukemia with acute reaction ?-Monitoring for any overt bleeding ?-We will holding home medication-Coumadin ? ?

## 2021-03-14 NOTE — Assessment & Plan Note (Signed)
-   Holding Coumadin due to thrombocytopenia ?-We will continue beta-blockers if tolerated ?-Holding BP meds ?

## 2021-03-14 NOTE — ED Notes (Signed)
Dinner Tray given to pt at this time. Vincent Carter ?

## 2021-03-14 NOTE — Assessment & Plan Note (Addendum)
-  No residual or new focal neurological findings ?- Currently anemic, thrombocytopenic ?-Holding Coumadin  ?-Stable we will monitor closely ? ?

## 2021-03-14 NOTE — Assessment & Plan Note (Addendum)
-   Likely due to sepsis ?-Continue aggressive IV fluid resuscitation ?-If no improvement-with initiation of pressors ?-Holding home medication of hydralazine, Coreg, Lasix, HCTZ, ?

## 2021-03-14 NOTE — Sepsis Progress Note (Signed)
Sepsis protocol monitored by eLink 

## 2021-03-14 NOTE — ED Triage Notes (Signed)
Patient c/o CP intermittently since last night and had to sleep with a heating pad. Patient is confused and lethargic per family. Patient pale in triage.  ?

## 2021-03-14 NOTE — Assessment & Plan Note (Addendum)
-   CABG 1997, last cardiac cath 2006-with 2 bare-metal stent in place ?Last cardiovascular stress test 09/09/2013 LVEF 49% ?-We will holding Coumadin antiplatelet therapy due to thrombocytopenia. ?-He is not on any statins ?

## 2021-03-14 NOTE — ED Notes (Signed)
Ekg completed. EDP at bedside. Family reports recent bone marrow biopsy ?

## 2021-03-14 NOTE — Hospital Course (Addendum)
Mr. 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 presenting today with generalized weakness, chest pain.  ?According to the family symptoms started last night, slept with heating pad on his chest, he was asymptomatic this morning until breakfast time ?When he was  found to be altered, confused while eating, lightheaded, dizziness, and hypotensive. ? ?ED work-up: ?Blood pressure (!) 85/46, pulse 66, temperature 97.6 ?F (36.4 ?C), temperature source Oral, resp. rate (!) 30, height 5' 2"  (1.575 m), weight 71 kg, SpO2 96 %. ? ?-CBC 4 days ago WBC 23, hemoglobin 9.9, platelet 97 ?Today: WBC 33.2, hemoglobin 8.6, platelets 67, absolute neutrophils 0.7, lymphocyte absolute 10.3, BUN 35, creatinine 2.11, ?-Lactic acid 3.4 ? ?EKG: Bradycardic, negative for any ST elevation depression, ?CXR: IMPRESSION: ?1. Stable cardiomegaly. ?2. Low lung volumes with bandlike atelectasis in the left mid lung, ?unchanged. No evidence of pneumonia or large pleural effusion. ?

## 2021-03-14 NOTE — Assessment & Plan Note (Addendum)
-   Meeting sepsis physiology, RR 30, BP 77/ 46 ?WBC 33.2, 67, creatinine 2.11, ?-Unknown etiology ?-Investigating chest imaging, UA ?-Chest x-ray reviewed-clear ?-Lactic acid 3.4 >> ? ?-Activated sepsis pathway: Aggressive IV fluid resuscitation with LR ?-Broad-spectrum IV antibiotics Vanco and Zosyn initiated >> switching to Azactam, vancomycin and metronidazole (patient seems to have allergies/intolerance to penicillin products) ? ?

## 2021-03-14 NOTE — H&P (Addendum)
History and Physical   Patient: Vincent Carter                            PCP: [name], MD                    DOB: [birth year]            DOA: 03/14/2021 HWE:[id number]             DOS: 03/14/2021, 4:15 PM  [name], MD  Patient coming from:   HOME  I have personally reviewed patient's medical records, in electronic medical records, including:  [facility], and care everywhere.    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 presenting today with generalized weakness, chest pain.  According to the family symptoms started last night, slept with heating pad on his chest, he was asymptomatic this morning until breakfast time When he was  found to be altered, confused while eating, lightheaded, dizziness, and hypotensive.  ED work-up: Blood pressure (!) 85/46, pulse 66, temperature 97.6 F (36.4 C), temperature source Oral, resp. rate (!) 30, height _0  (1.575 m), weight 71 kg, SpO2 96 %.  -CBC 4 days ago WBC 23, hemoglobin 9.9, platelet 97 Today: WBC 33.2, hemoglobin 8.6, platelets 67, absolute neutrophils 0.7, lymphocyte absolute 10.3, BUN 35, creatinine 2.11, -Lactic acid 3.4  EKG: Bradycardic, negative for any ST elevation depression, CXR: IMPRESSION: 1. Stable cardiomegaly. 2. Low lung volumes with bandlike atelectasis in the left mid lung, unchanged. No evidence of pneumonia or large pleural effusion.   Patient Denies having: Fever, Chills, Cough, SOB, Chest Pain, Abd pain, N/V/D, headache, dizziness, lightheadedness,  Dysuria, Joint pain, rash, open wounds at this time    Review of  Systems: As per HPI, otherwise 10 point review of systems were negative.   ----------------------------------------------------------------------------------------------------------------------  Allergies  Allergen Reactions   Augmentin [Amoxicillin-Pot Clavulanate] Diarrhea and Nausea And Vomiting   Codeine Nausea Only    Pill form of codeine causes nausea   Statins Other (See Comments)    Myalgia     Home MEDs:  Prior to Admission medications   Medication Sig Start Date End Date Taking? Authorizing Provider  carvedilol (COREG) 12.5 MG tablet Take 1 tablet (12.5 mg total) by mouth 2 (two) times daily with a meal. 07/18/20   Mercy Riding, MD  clindamycin (CLEOCIN) 300 MG capsule Take 1 capsule (300 mg total) by mouth 4 (four) times daily. X 7 days 02/25/21   Sherwood Gambler, MD  dextromethorphan-guaiFENesin [facility] DM) 30-600 MG 12hr tablet Take 1 tablet by mouth 2 (two) times daily. Patient taking differently: Take 1 tablet by mouth 2 (two) times daily as needed. 03/30/18   Kathie Dike, MD  fluticasone (FLONASE) 50 MCG/ACT nasal spray Place 2 sprays into both nostrils daily. 02/25/21   Sherwood Gambler, MD  furosemide (LASIX) 20 MG tablet Take 1 tablet (20 mg total) by mouth daily. 07/22/20 07/22/21  Mercy Riding, MD  hydrochlorothiazide (HYDRODIURIL) 25 MG tablet Take 25 mg by mouth daily. 02/23/21  [provider]  ipratropium-albuterol (DUONEB) 0.5-2.5 (3) MG/3ML SOLN Take 3 mLs by nebulization every 6 (six) hours as needed. 06/27/20   Chesley Mires, MD  lisinopril (ZESTRIL) 2.5 MG tablet Take 1 tablet (2.5 mg total) by mouth daily. 07/22/20   Mercy Riding, MD  montelukast (SINGULAIR) 10 MG tablet TAKE 1 TABLET BY MOUTH AT BEDTIME 09/28/20   Chesley Mires, MD  Nebulizer MISC Please provide 1 nebulizer machine and all supplies. Diagnosis: J44.1, Office number: [phone] 03/30/18   Kathie Dike, MD  nitroGLYCERIN (NITROSTAT) 0.4 MG SL tablet PLACE 1 TABLET UNDER TONGUE EVERY 5  MINUTES AS NEEDED FOR CHEST PAIN. 03/07/20   Arnoldo Lenis, MD  Omega-3 Fatty Acids (FISH OIL) 300 MG CAPS Take 2 capsules by mouth 2 (two) times daily.    [provider]  potassium chloride SA (KLOR-CON M) 20 MEQ tablet Take 1 tablet (20 mEq total) by mouth 2 (two) times daily for 3 days. 02/25/21 02/28/21  Sherwood Gambler, MD  predniSONE (DELTASONE) 10 MG tablet Take q day 6,5,4,3,2,1 02/28/21   Daleen Bo, MD  [facility] 80-4.5 MCG/ACT inhaler SMARTSIG:2 Puff(s) Via Inhaler Morning-Evening 11/10/20   [provider]  warfarin (COUMADIN) 2.5 MG tablet Take 1.5 tablets (3.75 mg total) every Monday, Wednesday, Friday, and Saturday, and 1 tablet (2.5 mg) on other days 01/08/21   Arnoldo Lenis, MD    PRN MEDs: sodium chloride, acetaminophen **OR** acetaminophen, bisacodyl, hydrALAZINE, HYDROmorphone (DILAUDID) injection, ipratropium, levalbuterol, nitroGLYCERIN, ondansetron **OR** ondansetron (ZOFRAN) IV, oxyCODONE, senna-docusate, sodium chloride flush, traZODone  Past Medical History:  Diagnosis Date   Arthritis    ASCVD (arteriosclerotic cardiovascular disease) cardiologist--  is now dr Roderic Palau branch (was dr [name])    CABG in 1997 -- LIMA to LAD;  cath 07-17-2012 PCI and BMStenting;   RE-DO CABG X4  in  07-24-2004 three-vessel disease; cath in 08-24-2004 post CABG ef 40-45% w/ anteroapical hypokinesis; occlusion SVG  to  RI; patent RIMA to LAD; unsuccessful PCI in totally occluded RI graft.;  last myoview 09-09-2013, no ischemia, moderate inferior wall defect, ef 49%   CHF (congestive heart failure) ([place])    Chronic obstructive pulmonary disease ([place])    Diverticulosis of colon    Euthyroid goiter    with low TSH    History of colon polyps    hyperplastic 2011   History of diverticulitis    History of kidney stones    History of ST elevation myocardial infarction (STEMI)    07-17-2004   w/ extensive RV infact per cath report   HOH (hard of hearing)    no  hearing aid   Hypertension    Low TSH level    Clinically euthyroid   Peripheral vascular disease (HCC)    Decreased distal pulses and abnormal Doppler   Prostate cancer [facility] urologist--  dr Irine Seal   T1c, Gleason 3+4=7,  PSA 4.71,  vol 38cc   Ptosis    Left, secondary to injury   S/p bare metal coronary artery stent    x3  to RCA  07-17-2004    Past Surgical History:  Procedure Laterality Date   [place]  08-24-2004  dr Lia Foyer   post re-do CABG/  Unsuccessful attempt PCI of clotted SVG to RI/  anteroapical segment severely hypokinetic,  ef 40-45%/  fresh occlusion SVG to RI,  continued patency of SVG to PAD and RIMA to LAD /   dLM 80%   CARDIOVASCULAR STRESS  TEST  09-09-2013  dr Roderic Palau branch   no reversible ischemia,  moderately large defect noted in the inferior wall extending from  mid ventricle to apex also involves true ventricle apex and anteroapical wall/  hypokinesis of inferior wall in region of prior infarct/scarring/  LVEF 49%   CATARACT EXTRACTION W/ INTRAOCULAR LENS  IMPLANT, BILATERAL  2006   COLONOSCOPY W/ POLYPECTOMY  12-26-2009   CORONARY ANGIOPLASTY WITH STENT PLACEMENT  07-17-2004   dr Sabino Snipes   PCI w/ BMS x3 to RCA and balloon pump placement/  LVSF after revascularization , ef 65% w/ mild inferior hypokinesis/  dLM  70% extending into origins of LAD (70% ostial, 80% mid, D1 branch 90% ostial)  CFX (80% ostial OM, 70% after first branch) /   99% mRCA stented and no residual;  remains 80% origin of posterior LV branch/  most apical portion of PDA was embolized    CORONARY ARTERY BYPASS GRAFT  1997   dr Merleen Nicely   LIMA to LAD   CORONARY ARTERY BYPASS GRAFT  07-24-2004  dr gerhardt   RIMA to LAD,  SVG to OM,  SVG to PDA,  SVG to RI   EYE SURGERY Left 1996   repair ocular injury   ORIF RIGHT TIB-FIB FX  1990's   PROSTATE BIOPSY     RADIOACTIVE SEED IMPLANT N/A 05/02/2014   Procedure: RADIOACTIVE SEED IMPLANT;   Surgeon: Irine Seal, MD;  [facility];  Service: Urology;  Laterality: N/A;   TONSILLECTOMY  age 25   TOTAL HIP ARTHROPLASTY Left 1983   RECONSTRUCTION/ THA/  SKIN AND BONE GRAFTS--  pt fell from 5 stories off [place]   TRANSTHORACIC ECHOCARDIOGRAM  [id number]   diastolic dysfunction/  ef 45-50%/  mild AV calcification without stenosis,  mild AR/  mild LAE and RAE/  trivial MR and TR       reports that he quit smoking about 2 years ago. His smoking use included cigarettes. He started smoking about 62 years ago. He has a 55.00 pack-year smoking history. He has never used smokeless tobacco. He reports that he does not drink alcohol and does not use drugs.   Family History  Problem Relation Age of Onset   Clotting disorder Mother    Heart attack Mother    Lung cancer Father    Cancer Brother        prostate    Physical Exam:   Vitals:   03/14/21 1315 03/14/21 1330 03/14/21 1345 03/14/21 1530  BP: (!) 88/48 94/66 (!) 85/46 96/67  Pulse: (!) 59 65 66 66  Resp: (!) 28 (!) 33 (!) 30 (!) 24  Temp:      TempSrc:      SpO2: 97% 98% 96% 94%  Weight:      Height:       Constitutional: NAD, calm, comfortable Eyes: PERRL, lids and conjunctivae normal ENMT: Mucous membranes are moist. Posterior pharynx clear of any exudate or lesions.Normal dentition.  Neck: normal, supple, no masses, no thyromegaly Respiratory: clear to auscultation bilaterally, no wheezing, no crackles. Normal respiratory effort. No accessory muscle use.  Cardiovascular: Regular rate and rhythm, no murmurs / rubs / gallops. No extremity edema. 2+ pedal pulses. No carotid bruits.  Abdomen: no tenderness, no masses palpated. No hepatosplenomegaly. Bowel sounds positive.  Musculoskeletal: no clubbing / cyanosis. No joint deformity upper and lower extremities. Good ROM, no contractures. Normal muscle tone.  Neurologic: CN II-XII grossly intact. Sensation intact, DTR normal.  Strength 5/5 in all 4.   Psychiatric: Normal judgment and insight. Alert and oriented x 3. Normal mood.  Skin: no rashes, lesions, ulcers. No induration Wounds: per nursing documentation       Labs on admission:    I have personally reviewed following labs and imaging studies  CBC: Recent Labs  Lab 03/10/21 0800 03/14/21 1235  WBC 23.1* 33.2*  NEUTROABS 0.2* 0.7*  HGB 9.9* 8.6*  HCT 29.6* 26.6*  MCV 103.5* 107.7*  PLT 79* 67*   Basic Metabolic Panel: Recent Labs  Lab 03/14/21 1235  NA 134*  K 3.5  CL 106  CO2 22  GLUCOSE 141*  BUN 35*  CREATININE 2.11*  CALCIUM 8.1*   GFR: Estimated Creatinine Clearance: 25.4 mL/min (A) (by C-G formula based on SCr of 2.11 mg/dL (H)). Liver Function Tests: Recent Labs  Lab 03/14/21 1235  AST 18  ALT 21  ALKPHOS 48  BILITOT 1.5*  PROT 6.6  ALBUMIN 2.8*   No results for input(s): LIPASE, AMYLASE in the last 168 hours. No results for input(s): AMMONIA in the last 168 hours. Coagulation Profile: Recent Labs  Lab 03/14/21 1235  INR 2.1*       Component Value Date/Time   COLORURINE YELLOW 07/15/2020 1939   APPEARANCEUR CLEAR 07/15/2020 1939   LABSPEC 1.045 (H) 07/15/2020 1939   PHURINE 6.0 07/15/2020 1939   GLUCOSEU NEGATIVE 07/15/2020 1939   HGBUR SMALL (A) 07/15/2020 1939   BILIRUBINUR NEGATIVE 07/15/2020 1939   KETONESUR NEGATIVE 07/15/2020 1939   PROTEINUR NEGATIVE 07/15/2020 1939   NITRITE NEGATIVE 07/15/2020 1939   LEUKOCYTESUR NEGATIVE 07/15/2020 1939    Last A1C:  Lab Results  Component Value Date   HGBA1C 5.4 07/16/2020     Radiologic Exams on Admission:   DG Chest Port 1 View  Result Date: 03/14/2021 CLINICAL DATA:  Questionable sepsis.  Chest pain. EXAM: PORTABLE CHEST 1 VIEW COMPARISON:  Chest radiograph dated February 28, 2021. FINDINGS: The heart is enlarged. Atherosclerotic calcification of aortic arch. Evidence of prior coronary artery bypass grafting. Low lung volumes with bibasilar atelectasis. Bandlike  atelectasis in the left mid lung is unchanged. IMPRESSION: 1. Stable cardiomegaly. 2. Low lung volumes with bandlike atelectasis in the left mid lung, unchanged. No evidence of pneumonia or large pleural effusion. Electronically Signed   By: Keane Police D.O.   On: 03/14/2021 13:19     EKG:   Independently reviewed.  Orders placed or performed during the hospital encounter of 03/14/21   ED EKG 12-Lead   ED EKG 12-Lead   EKG 12-Lead   ---------------------------------------------------------------------------------------------------------------------------------------    Assessment / Plan:   Principal Problem:   Sepsis ([place]) Active Problems:   Leukocytosis   PAF (paroxysmal atrial fibrillation) (HCC)   AML (acute myeloblastic leukemia) ([place])   CVA (cerebral vascular accident) ([place])   AKI (acute kidney injury) ([place])   Hypertension   Emphysema lung ([place])   CAD S/P percutaneous coronary angioplasty   Malignant neoplasm of prostate ([place])   Acute on chronic systolic CHF (congestive heart failure) (HCC)   Atrial fibrillation with RVR (HCC)   Hypotensive episode   Thrombocytopenia (HCC)   Assessment and Plan: * Sepsis ([place]) - Meeting sepsis physiology, RR 30, BP 77/ 46 WBC 33.2, 67, creatinine 2.11, -Unknown etiology -Investigating chest imaging, UA -Chest x-ray reviewed-clear -Lactic acid 3.4 >>  -Activated sepsis pathway: Aggressive IV fluid resuscitation with LR -Broad-spectrum IV antibiotics Vanco and Zosyn initiated >> switching to Azactam, vancomycin and metronidazole (patient seems  to have allergies/intolerance to penicillin products)   Leukocytosis - Acute on chronic, with recent diagnosis of leukemia -Ruling out sepsis-infection -With elevated lactic acid, will pursue with treatment for sepsis -We will monitor leukocytosis closely  -ED provider Dr. Sabra Heck has discussed the case with Dr. Lorraine Lax if this is leukemoid reaction and if WBC gets > greater  than 50 K then hydroxyurea treatment will be considered   AML (acute myeloblastic leukemia) (HCC) - Abnormal CBC with differential: Acute leukocytosis, thrombocytopenia -Recent work-up by oncologist Dr. Delton Coombes Including bone biopsy 04/07/2021 -ED provider Dr. Sabra Heck has discussed the case with Dr. Delton Coombes -he recommended supportive therapy IV fluids ruling out infection,  1 unit PRBC transfusion -If WBC > 50 K, consideration of treatment with hydroxyurea  -Dr. Delton Coombes will be consulted appreciate his follow-up and input  PAF (paroxysmal atrial fibrillation) ([place]) - Holding Coumadin due to thrombocytopenia -We will continue beta-blockers if tolerated -Holding BP meds  CVA (cerebral vascular accident) ([place]) -No residual or new focal neurological findings - Currently anemic, thrombocytopenic -Holding Coumadin  -Stable we will monitor closely   AKI (acute kidney injury) ([place]) - Likely due to sepsis, dehydration, hypotension -With holding BP meds -Continue IV fluid resuscitation -Avoiding nephrotoxin  -BUN 35 /creatinine 2.11  Thrombocytopenia (HCC) - Likely associated with sepsis versus leukemia with acute reaction -Monitoring for any overt bleeding -We will holding home medication-Coumadin   Hypotensive episode - Likely due to sepsis -Continue aggressive IV fluid resuscitation -If no improvement-with initiation of pressors -Holding home medication of hydralazine, Coreg, Lasix, HCTZ,  Acute on chronic systolic CHF (congestive heart failure) ([place]) - Currently hypotensive -Initiating IV fluids for sepsis physiology -Monitoring for fluid overload -We will monitor I's and O's, daily weight -Holding home dose Lasix -Last Echo: 03/24/2018 EJ EF 40-45%, mild LVH, A-fib, mild/moderate systolic dysfunction  Malignant neoplasm of prostate (HCC) In remission -- PSA was 4.7. - Status post I-125 radioactive seed implantation on 05/02/2014.  CAD S/P percutaneous coronary  angioplasty - CABG 1997, last cardiac cath 2006-with 2 bare-metal stent in place Last cardiovascular stress test 09/09/2013 LVEF 49% -We will holding Coumadin antiplatelet therapy due to thrombocytopenia. -He is not on any statins  Emphysema lung (HCC) - Continue oxygen supplements, as needed DuoNeb bronchodilator treatment, -Home inhalers including Breo, Singulair,  will be continued  Hypertension - Currently hypotensive likely due to sepsis -We will holding home BP meds: Which includes lisinopril, HCTZ, Lasix, Coreg              Consults called: Oncologist Dr. Delton Coombes -------------------------------------------------------------------------------------------------------------------------------------------- DVT prophylaxis:  heparin injection 5,000 Units Start: 03/14/21 2200 TED hose Start: 03/14/21 1432 SCDs Start: 03/14/21 1432   Code Status:   Code Status: Full Code   Admission status: Patient will be admitted as Inpatient, with a greater than 2 midnight length of stay. Level of care: Stepdown   Family Communication:  none at bedside  (The above findings and plan of care has been discussed with patient in detail, the patient expressed understanding and agreement of above plan)  --------------------------------------------------------------------------------------------------------------------------------------------------  Disposition Plan: >3 days Status is: Inpatient Remains inpatient appropriate because: Will need aggressive treatment for sepsis, leukocytosis, ICU admission IV fluids, IV antibiotics  -----------------------------------------------------------------------------------------------------------------------------------------  Time spent: > than  77  Min.  Of critical time was spent evaluating the patient, reviewing all medical records including historical records, electronic record, drawn plan of care admitting to ICU with aggressive IV fluid  resuscitation IV antibiotics-stabilizing the patient  SIGNED: Deatra James, MD,  FHM. [facility],  Pager (Please use [email or web address] to page to text)  If 7PM-7AM, please contact night-coverage [email or web address],  03/14/2021, 4:15 PM

## 2021-03-14 NOTE — ED Provider Notes (Incomplete Revision)
[facility] EMERGENCY DEPARTMENT Provider Note   CSN: [id number] Arrival date & time: 03/14/21  1153     History  Chief Complaint  Patient presents with   Weakness   Chest Pain    Vincent Carter is a 76 y.o. male.   Weakness Associated symptoms: chest pain   Chest Pain Associated symptoms: weakness    This patient is a 76 year old male, he has a history of prior hypertension on carvedilol and hydrochlorothiazide and lisinopril, he has a history of coronary disease, history of being on Coumadin.  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  A CBC that was performed 4 days ago showed a white blood cell count of 23,000 a hemoglobin of 9.9 and platelets of 79,000.  According to the patient and his daughter who are the primary historians the patient was in his usual state of health up until last night, started to have some chest pain, he had recently been treated for some type of respiratory illness, this is verified in the medical record based on ER visit from about 3 weeks ago.  He slept with a heating pad on his chest because of the pain he felt when he woke up this morning that he was okay.  He went to eat breakfast and was found to be altered while eating, lightheaded, dizzy and hypotensive with a blood pressure of 80/50 on arrival.  The patient was mildly tachypneic but denies any chest pain at this time, family members report that he has been confused and not answering questions appropriately today.  Home Medications Prior to Admission medications   Medication Sig Start Date End Date Taking? Authorizing Provider  carvedilol (COREG) 12.5 MG tablet Take 1 tablet (12.5 mg total) by mouth 2 (two) times daily with a meal. 07/18/20   Mercy Riding, MD  clindamycin (CLEOCIN) 300 MG capsule Take 1 capsule (300  mg total) by mouth 4 (four) times daily. X 7 days 02/25/21   Sherwood Gambler, MD  dextromethorphan-guaiFENesin [facility] DM) 30-600 MG 12hr tablet Take 1 tablet by mouth 2 (two) times daily. Patient taking differently: Take 1 tablet by mouth 2 (two) times daily as needed. 03/30/18   Kathie Dike, MD  fluticasone (FLONASE) 50 MCG/ACT nasal spray Place 2 sprays into both nostrils daily. 02/25/21   Sherwood Gambler, MD  furosemide (LASIX) 20 MG tablet Take 1 tablet (20 mg total) by mouth daily. 07/22/20 07/22/21  Mercy Riding, MD  hydrochlorothiazide (HYDRODIURIL) 25 MG tablet Take 25 mg by mouth daily. 02/23/21   [provider]  ipratropium-albuterol (DUONEB) 0.5-2.5 (3) MG/3ML SOLN Take 3 mLs by nebulization every 6 (six) hours as needed. 06/27/20   Chesley Mires, MD  lisinopril (ZESTRIL) 2.5 MG tablet Take 1 tablet (2.5 mg total) by mouth daily. 07/22/20   Mercy Riding, MD  montelukast (SINGULAIR) 10 MG tablet TAKE 1 TABLET BY MOUTH AT BEDTIME 09/28/20   Chesley Mires, MD  Nebulizer MISC Please provide 1 nebulizer machine and all supplies. Diagnosis: J44.1, Office number: [phone] 03/30/18   Kathie Dike, MD  nitroGLYCERIN (NITROSTAT) 0.4 MG SL tablet PLACE 1 TABLET UNDER TONGUE EVERY 5 MINUTES AS NEEDED FOR CHEST PAIN. 03/07/20   Arnoldo Lenis, MD  Omega-3 Fatty Acids (FISH OIL) 300 MG CAPS Take 2 capsules by mouth 2 (two) times daily.  [provider]  potassium chloride SA (KLOR-CON M) 20 MEQ tablet Take 1 tablet (20 mEq total) by mouth 2 (two) times daily for 3 days. 02/25/21 02/28/21  Sherwood Gambler, MD  predniSONE (DELTASONE) 10 MG tablet Take q day 6,5,4,3,2,1 02/28/21   Daleen Bo, MD  [facility] 80-4.5 MCG/ACT inhaler SMARTSIG:2 Puff(s) Via Inhaler Morning-Evening 11/10/20   [provider]  warfarin (COUMADIN) 2.5 MG tablet Take 1.5 tablets (3.75 mg total) every Monday, Wednesday, Friday, and Saturday, and 1 tablet (2.5 mg) on other days 01/08/21   Arnoldo Lenis, MD      Allergies    Augmentin [amoxicillin-pot clavulanate], Codeine, and Statins    Review of Systems   Review of Systems  Cardiovascular:  Positive for chest pain.  Neurological:  Positive for weakness.  All other systems reviewed and are negative.  Physical Exam Updated Vital Signs BP (!) 85/46    Pulse 66    Temp 97.6 F (36.4 C) (Oral)    Resp (!) 30    Ht 1.575 m (_0 )    Wt 71 kg    SpO2 96%    BMI 28.63 kg/m  Physical Exam Vitals and nursing note reviewed.  Constitutional:      General: He is not in acute distress.    Appearance: He is well-developed. He is ill-appearing (Weak and chronically ill-appearing, frail).  HENT:     Head: Normocephalic and atraumatic.     Mouth/Throat:     Pharynx: No oropharyngeal exudate.  Eyes:     General: No scleral icterus.       Right eye: No discharge.        Left eye: No discharge.     Conjunctiva/sclera: Conjunctivae normal.     Pupils: Pupils are equal, round, and reactive to light.  Neck:     Thyroid: No thyromegaly.     Vascular: No JVD.  Cardiovascular:     Rate and Rhythm: Regular rhythm. Bradycardia present.     Heart sounds: Normal heart sounds. No murmur heard.   No friction rub. No gallop.     Comments: Bradycardia, heart rate of 55 to 60 bpm Pulmonary:     Effort: Pulmonary effort is normal. No respiratory distress.     Breath sounds: Normal breath sounds. No wheezing or rales.  Abdominal:     General: Bowel sounds are normal. There is no distension.     Palpations: Abdomen is soft. There is no mass.     Tenderness: There is no abdominal tenderness.  Musculoskeletal:        General: No tenderness. Normal range of motion.     Cervical back: Normal range of motion and neck supple.     Right lower leg: No tenderness. No edema.     Left lower leg: No tenderness. No edema.  Lymphadenopathy:     Cervical: No cervical adenopathy.  Skin:    General: Skin is warm and dry.     Coloration: Skin is pale.      Findings: No erythema or rash.  Neurological:     Mental Status: He is alert.     Coordination: Coordination normal.     Comments: Answers questions, follows commands, seems to be mildly confused but able to move all 4 extremities symmetrically and has no facial droop  Psychiatric:        Behavior: Behavior normal.    ED Results / Procedures / Treatments   Labs (all labs ordered are listed, but  only abnormal results are displayed) Labs Reviewed  COMPREHENSIVE METABOLIC PANEL - Abnormal; Notable for the following components:      Result Value   Sodium 134 (*)    Glucose, Bld 141 (*)    BUN 35 (*)    Creatinine, Ser 2.11 (*)    Calcium 8.1 (*)    Albumin 2.8 (*)    Total Bilirubin 1.5 (*)    GFR, Estimated 32 (*)    All other components within normal limits  CBC WITH DIFFERENTIAL/PLATELET - Abnormal; Notable for the following components:   WBC 33.2 (*)    RBC 2.47 (*)    Hemoglobin 8.6 (*)    HCT 26.6 (*)    MCV 107.7 (*)    MCH 34.8 (*)    RDW 19.6 (*)    Platelets 67 (*)    Neutro Abs 0.7 (*)    Lymphs Abs 10.3 (*)    Monocytes Absolute 21.6 (*)    All other components within normal limits  PROTIME-INR - Abnormal; Notable for the following components:   Prothrombin Time 23.8 (*)    INR 2.1 (*)    All other components within normal limits  APTT - Abnormal; Notable for the following components:   aPTT 49 (*)    All other components within normal limits  RESP PANEL BY RT-PCR (FLU A&B, COVID) ARPGX2  CULTURE, BLOOD (ROUTINE X 2)  CULTURE, BLOOD (ROUTINE X 2)  URINE CULTURE  LACTIC ACID, PLASMA  LACTIC ACID, PLASMA  URINALYSIS, ROUTINE W REFLEX MICROSCOPIC  URIC ACID  LACTATE DEHYDROGENASE    EKG None  Radiology DG Chest Port 1 View  Result Date: 03/14/2021 CLINICAL DATA:  Questionable sepsis.  Chest pain. EXAM: PORTABLE CHEST 1 VIEW COMPARISON:  Chest radiograph dated February 28, 2021. FINDINGS: The heart is enlarged. Atherosclerotic calcification of  aortic arch. Evidence of prior coronary artery bypass grafting. Low lung volumes with bibasilar atelectasis. Bandlike atelectasis in the left mid lung is unchanged. IMPRESSION: 1. Stable cardiomegaly. 2. Low lung volumes with bandlike atelectasis in the left mid lung, unchanged. No evidence of pneumonia or large pleural effusion. Electronically Signed   By: Keane Police D.O.   On: 03/14/2021 13:19    Procedures .Critical Care Performed by: Noemi Chapel, MD Authorized by: Noemi Chapel, MD   Critical care provider statement:    Critical care time (minutes):  35   Critical care time was exclusive of:  Separately billable procedures and treating other patients and teaching time   Critical care was necessary to treat or prevent imminent or life-threatening deterioration of the following conditions:  Sepsis   Critical care was time spent personally by me on the following activities:  Development of treatment plan with patient or surrogate, discussions with consultants, evaluation of patient's response to treatment, examination of patient, ordering and review of laboratory studies, ordering and review of radiographic studies, ordering and performing treatments and interventions, pulse oximetry, re-evaluation of patient's condition, review of old charts and obtaining history from patient or surrogate   I assumed direction of critical care for this patient from another provider in my specialty: no     Care discussed with: admitting provider   Comments:          Medications Ordered in ED Medications  0.9 %  sodium chloride infusion (has no administration in time range)  vancomycin (VANCOCIN) IVPB 1000 mg/200 mL premix (has no administration in time range)  piperacillin-tazobactam (ZOSYN) IVPB 3.375 g (has no administration in  time range)  sodium chloride 0.9 % bolus 1,500 mL (has no administration in time range)  sodium chloride 0.9 % bolus 500 mL (500 mLs Intravenous Bolus 03/14/21 1309)   cefTRIAXone (ROCEPHIN) 2 g in sodium chloride 0.9 % 100 mL IVPB (2 g Intravenous New Bag/Given 03/14/21 1322)  metroNIDAZOLE (FLAGYL) IVPB 500 mg (500 mg Intravenous New Bag/Given 03/14/21 1307)  sodium chloride 0.9 % bolus 500 mL (0 mLs Intravenous Stopped 03/14/21 1304)    ED Course/ Medical Decision Making/ A&P                           Medical Decision Making Amount and/or Complexity of Data Reviewed Labs: ordered. Radiology: ordered. ECG/medicine tests: ordered.  Risk Prescription drug management. Decision regarding hospitalization.   EKG does not show any acute findings other than poor R wave progression, this patient is ill-appearing hypotensive and slightly bradycardic which is certainly concerning, labs of been ordered including a troponin, cultures, chest x-ray, IV fluids to resuscitate the hypotension and I anticipate the need for admission.  This could be related to an acute blast crisis as well   Co morbidities that complicate the patient evaluation  Acute leukemia, metastatic prostate cancer, hypertension, prior coronary disease   Additional history obtained:  Additional history obtained from electronic medical record as well as the family at the bedside External records from outside source obtained and reviewed including prior bone marrow biopsies and admissions to the hospital   Lab Tests:  I Ordered, and personally interpreted labs.  The pertinent results include: Significant leukocytosis, his blood counts have gone from 23,000-33,000 over the last 5 days, his metabolic panel shows that he has an acute kidney injury with a creatinine that is gone over 2.  His hemoglobin is dropped to 8.6 from 9.9.  His platelets have dropped to 67,000.   Imaging Studies ordered:  I ordered imaging studies including portable chest x-ray I independently visualized and interpreted imaging which showed atelectasis but no infiltrate I agree with the radiologist  interpretation   Cardiac Monitoring:  The patient was maintained on a cardiac monitor.  I personally viewed and interpreted the cardiac monitored which showed an underlying rhythm of: Normal sinus rhythm bordering on sinus bradycardia   Medicines ordered and prescription drug management:  I ordered medication including vancomycin and Zosyn as well as IV fluids for hydration for possible sepsis Reevaluation of the patient after these medicines showed that the patient improved I have reviewed the patients home medicines and have made adjustments as needed   Test Considered:  CT scan, after discussion with oncology this does not seem to be necessary   Critical Interventions:  IV fluids and antibiotics, blood cultures, lactic acid, admission to the hospital, this patient is critically ill   Consultations Obtained:  I requested consultation with the Dr. Delton Coombes with the oncology service,  and discussed lab and imaging findings as well as pertinent plan - they recommend: Admission to the hospital, they will consult.  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  A total of 2500 cc of IV fluids were  given           Final Clinical Impression(s) / ED Diagnoses Final diagnoses:  Hypotension, unspecified hypotension type  Acute kidney injury ([place])  Acute leukemia not having achieved remission [facility])    Rx / DC Orders ED Discharge Orders     None         Noemi Chapel, MD 03/14/21 1414

## 2021-03-15 ENCOUNTER — Inpatient Hospital Stay (HOSPITAL_COMMUNITY): Payer: Medicare Other

## 2021-03-15 DIAGNOSIS — N179 Acute kidney failure, unspecified: Secondary | ICD-10-CM

## 2021-03-15 LAB — COMPREHENSIVE METABOLIC PANEL
ALT: 21 U/L (ref 0–44)
AST: 17 U/L (ref 15–41)
Albumin: 2.5 g/dL — ABNORMAL LOW (ref 3.5–5.0)
Alkaline Phosphatase: 47 U/L (ref 38–126)
Anion gap: 6 (ref 5–15)
BUN: 31 mg/dL — ABNORMAL HIGH (ref 8–23)
CO2: 21 mmol/L — ABNORMAL LOW (ref 22–32)
Calcium: 7.6 mg/dL — ABNORMAL LOW (ref 8.9–10.3)
Chloride: 109 mmol/L (ref 98–111)
Creatinine, Ser: 1.56 mg/dL — ABNORMAL HIGH (ref 0.61–1.24)
GFR, Estimated: 45 mL/min — ABNORMAL LOW (ref >60)
Glucose, Bld: 110 mg/dL — ABNORMAL HIGH (ref 70–99)
Potassium: 3.9 mmol/L (ref 3.5–5.1)
Sodium: 136 mmol/L (ref 135–145)
Total Bilirubin: 0.8 mg/dL (ref 0.3–1.2)
Total Protein: 5.8 g/dL — ABNORMAL LOW (ref 6.5–8.1)

## 2021-03-15 LAB — EXPECTORATED SPUTUM ASSESSMENT W GRAM STAIN, RFLX TO RESP C

## 2021-03-15 LAB — CBC WITH DIFFERENTIAL/PLATELET
Basophils Absolute: 0 10*3/uL (ref 0.0–0.1)
Basophils Relative: 0 %
Blasts: 11 %
Eosinophils Absolute: 0 10*3/uL (ref 0.0–0.5)
Eosinophils Relative: 0 %
HCT: 25 % — ABNORMAL LOW (ref 39.0–52.0)
Hemoglobin: 8 g/dL — ABNORMAL LOW (ref 13.0–17.0)
Lymphocytes Relative: 12 %
Lymphs Abs: 3.9 10*3/uL (ref 0.7–4.0)
MCH: 34.8 pg — ABNORMAL HIGH (ref 26.0–34.0)
MCHC: 32 g/dL (ref 30.0–36.0)
MCV: 108.7 fL — ABNORMAL HIGH (ref 80.0–100.0)
Monocytes Absolute: 23.2 10*3/uL — ABNORMAL HIGH (ref 0.1–1.0)
Monocytes Relative: 71 %
Neutro Abs: 2 10*3/uL (ref 1.7–7.7)
Neutrophils Relative %: 6 %
Platelets: 63 10*3/uL — ABNORMAL LOW (ref 150–400)
RBC: 2.3 MIL/uL — ABNORMAL LOW (ref 4.22–5.81)
RDW: 19.7 % — ABNORMAL HIGH (ref 11.5–15.5)
WBC: 32.7 10*3/uL — ABNORMAL HIGH (ref 4.0–10.5)
nRBC: 0.1 % (ref 0.0–0.2)

## 2021-03-15 LAB — CBC
HCT: 29.1 % — ABNORMAL LOW (ref 39.0–52.0)
Hemoglobin: 9.2 g/dL — ABNORMAL LOW (ref 13.0–17.0)
MCH: 32.6 pg (ref 26.0–34.0)
MCHC: 31.6 g/dL (ref 30.0–36.0)
MCV: 103.2 fL — ABNORMAL HIGH (ref 80.0–100.0)
Platelets: 67 10*3/uL — ABNORMAL LOW (ref 150–400)
RBC: 2.82 MIL/uL — ABNORMAL LOW (ref 4.22–5.81)
RDW: 22.4 % — ABNORMAL HIGH (ref 11.5–15.5)
WBC: 35.7 10*3/uL — ABNORMAL HIGH (ref 4.0–10.5)
nRBC: 0 % (ref 0.0–0.2)

## 2021-03-15 LAB — CBG MONITORING, ED: Glucose-Capillary: 130 mg/dL — ABNORMAL HIGH (ref 70–99)

## 2021-03-15 LAB — PROTIME-INR
INR: 2.4 — ABNORMAL HIGH (ref 0.8–1.2)
Prothrombin Time: 26.2 seconds — ABNORMAL HIGH (ref 11.4–15.2)

## 2021-03-15 LAB — ABO/RH: ABO/RH(D): O POS

## 2021-03-15 LAB — PROCALCITONIN: Procalcitonin: 0.42 ng/mL

## 2021-03-15 LAB — MRSA NEXT GEN BY PCR, NASAL: MRSA by PCR Next Gen: NOT DETECTED

## 2021-03-15 LAB — PREPARE RBC (CROSSMATCH)

## 2021-03-15 MED ORDER — VANCOMYCIN HCL 1500 MG/300ML IV SOLN
1500.0000 mg | INTRAVENOUS | Status: DC
  Administered 2021-03-16: 1500 mg via INTRAVENOUS
  Filled 2021-03-15: qty 300

## 2021-03-15 MED ORDER — VANCOMYCIN HCL 1500 MG/300ML IV SOLN
1500.0000 mg | INTRAVENOUS | Status: DC
Start: 2021-03-16 — End: 2021-03-15

## 2021-03-15 MED ORDER — LEVALBUTEROL HCL 0.63 MG/3ML IN NEBU
0.6300 mg | INHALATION_SOLUTION | Freq: Three times a day (TID) | RESPIRATORY_TRACT | Status: DC
  Administered 2021-03-15 – 2021-03-17 (×7): 0.63 mg via RESPIRATORY_TRACT
  Filled 2021-03-15 (×7): qty 3

## 2021-03-15 MED ORDER — CHLORHEXIDINE GLUCONATE CLOTH 2 % EX PADS
6.0000 | MEDICATED_PAD | Freq: Every day | CUTANEOUS | Status: DC
  Administered 2021-03-15 – 2021-03-16 (×2): 6 via TOPICAL

## 2021-03-15 MED ORDER — SODIUM CHLORIDE 0.9% IV SOLUTION: Freq: Once | INTRAVENOUS | Status: AC

## 2021-03-15 MED ORDER — DIPHENHYDRAMINE HCL 25 MG PO CAPS
25.0000 mg | ORAL_CAPSULE | Freq: Once | ORAL | Status: AC
  Administered 2021-03-15: 25 mg via ORAL
  Filled 2021-03-15: qty 1

## 2021-03-15 MED ORDER — IPRATROPIUM BROMIDE 0.02 % IN SOLN
0.5000 mg | Freq: Three times a day (TID) | RESPIRATORY_TRACT | Status: DC
  Administered 2021-03-15 – 2021-03-17 (×7): 0.5 mg via RESPIRATORY_TRACT
  Filled 2021-03-15 (×7): qty 2.5

## 2021-03-15 NOTE — Assessment & Plan Note (Signed)
-   CABG 1997, last cardiac cath 2006-with 2 bare-metal stent in place ?Last cardiovascular stress test 09/09/2013 LVEF 49% ?-We will holding Coumadin antiplatelet therapy due to thrombocytopenia. ?-He is not on any statins ?

## 2021-03-15 NOTE — Assessment & Plan Note (Addendum)
-   we were Holding Coumadin due to thrombocytopenia ?-We will continue beta-blockers if tolerated ?-Holding BP meds ?-Per Dr. Delton Coombes as long as platelets are> 50 K ?Can resume Coumadin ?

## 2021-03-15 NOTE — Assessment & Plan Note (Addendum)
-  Acute on chronic, with recent diagnosis of leukemia ?-Met sepsis criteria on admission with unknown etiology ?(Hypotensive, lactic acidosis, marked leukocytosis, AKI) ?-Ulcers remain negative to date ? ?-We will monitor leukocytosis closely ? ?-Discussed with Dr. Lorraine Lax if this is leukemoid reaction and if WBC gets > greater than 50 K then hydroxyurea treatment will be considered ?-As long as platelets greater than 50 can resume his home medication of Coumadin ? ?-WBC improved to 23.1>>16.2,   hemoglobin 8.6, platelet 58 >> 66 ?-Remained stable ? ?

## 2021-03-15 NOTE — Assessment & Plan Note (Signed)
In remission ?-- PSA was 4.7. ?- Status post I-125 radioactive seed implantation on 05/02/2014. ?

## 2021-03-15 NOTE — Progress Notes (Signed)
PROGRESS NOTE    Patient: Vincent Carter                            PCP: [name], MD                    DOB: May 07, 1945            DOA: 03/14/2021 OJJ:[id number]             DOS: 03/15/2021, 11:00 AM   LOS: 1 day   Date of Service: The patient was seen and examined on 03/15/2021  Subjective:   The patient was seen and examined this morning. 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 presenting today with generalized weakness, chest pain.  According to the family symptoms started last night, slept with heating pad on his chest, he was asymptomatic this morning until breakfast time When he was  found to be altered, confused while eating, lightheaded, dizziness, and hypotensive.  ED work-up: Blood pressure (!) 85/46, pulse 66, temperature 97.6 F (36.4 C), temperature source Oral, resp. rate (!) 30, height 5' 2"  (1.575 m), weight 71 kg, SpO2 96 %.  -CBC 4 days ago WBC 23, hemoglobin 9.9, platelet 97 Today: WBC 33.2, hemoglobin 8.6, platelets 67, absolute neutrophils 0.7, lymphocyte absolute 10.3, BUN 35, creatinine 2.11, -Lactic acid 3.4  EKG: Bradycardic, negative for any ST elevation depression, CXR: IMPRESSION: 1. Stable cardiomegaly. 2. Low lung volumes with bandlike atelectasis in the left mid lung, unchanged. No evidence of pneumonia or large pleural effusion.    Assessment & Plan:   Principal Problem:   Sepsis ([place]) Active Problems:   Leukocytosis   PAF (paroxysmal atrial fibrillation) (HCC)   AML (acute myeloblastic leukemia) (HCC)   CVA  (cerebral vascular accident) ([place])   AKI (acute kidney injury) ([place])   Hypertension   Emphysema lung (HCC)   CAD S/P percutaneous coronary angioplasty   Malignant neoplasm of prostate (HCC)   Acute on chronic systolic CHF (congestive heart failure) (HCC)   Hypotensive episode   Thrombocytopenia (HCC)     Assessment and Plan: * Sepsis ([place]) - Meeting sepsis physiology, RR 30, BP 77/ 46 WBC 33.2, 67, creatinine 2.11, -Unknown etiology -Investigating chest imaging, UA -Chest x-ray reviewed-clear -Lactic acid 3.4 >> 2.6  -Activated sepsis pathway: Aggressive IV fluid resuscitation with LR -Broad-spectrum IV antibiotics Vanco and Zosyn initiated >> switching to Rocephin, Flagyl, vancomycin  --Afebrile this morning, mildly hypertensive, improved leukocytosis mildly -Cultures remain negative, continue current antibiotics  Leukocytosis - Acute on chronic, with recent diagnosis of leukemia -Ruling out sepsis-infection -With elevated lactic acid, will pursue with treatment for sepsis -We will monitor leukocytosis closely  -ED provider Dr. Sabra Heck has discussed the case with Dr. Lorraine Lax if this is leukemoid reaction and if WBC gets > greater than 50 K then hydroxyurea treatment will be considered  -WBC 32.7, hemoglobin 8, hematocrit 25, platelets 63 -Remained stable   AML (acute myeloblastic leukemia) (HCC) - Abnormal CBC with differential: Acute leukocytosis, thrombocytopenia -Recent work-up by oncologist Dr. Delton Coombes Including bone biopsy  04/07/2021 -ED provider Dr. Sabra Heck has discussed the case with Dr. Delton Coombes -he recommended supportive therapy IV fluids ruling out infection,  1 unit PRBC transfusion -If WBC > 50 K, consideration of treatment with hydroxyurea  -Dr. Delton Coombes will be consulted appreciate his follow-up and input  -On admission hemoglobin 8.6 >> 1U PRBC 3/4 >> 8.0 >> 1U PRBC 3/5 >>>  PAF (paroxysmal atrial fibrillation) (HCC) - Holding  Coumadin due to thrombocytopenia -We will continue beta-blockers if tolerated -Holding BP meds  CVA (cerebral vascular accident) ([place]) -No residual or new focal neurological findings - Currently anemic, thrombocytopenic -Holding Coumadin  -Stable we will monitor closely   AKI (acute kidney injury) ([place]) - Likely due to sepsis, dehydration, hypotension -With holding BP meds -Continue IV fluid resuscitation -Avoiding nephrotoxin  -BUN 35 /creatinine 2.11 >> 1.56  Thrombocytopenia (HCC) - Likely associated with sepsis versus leukemia with acute reaction -Monitoring for any overt bleeding -We will holding home medication-Coumadin   Hypotensive episode - Likely due to sepsis -Continue aggressive IV fluid resuscitation -If no improvement-with initiation of pressors -Holding home medication of hydralazine, Coreg, Lasix, HCTZ,  Acute on chronic systolic CHF (congestive heart failure) (HCC) - Currently hypotensive -Initiating IV fluids for sepsis physiology -Monitoring for fluid overload -We will monitor I's and O's, daily weight -Holding home dose Lasix -Last Echo: 03/24/2018 EJ EF 40-45%, mild LVH, A-fib, mild/moderate systolic dysfunction  Malignant neoplasm of prostate (HCC) In remission -- PSA was 4.7. - Status post I-125 radioactive seed implantation on 05/02/2014.  CAD S/P percutaneous coronary angioplasty - CABG 1997, last cardiac cath 2006-with 2 bare-metal stent in place Last cardiovascular stress test 09/09/2013 LVEF 49% -We will holding Coumadin antiplatelet therapy due to thrombocytopenia. -He is not on any statins  Emphysema lung (HCC) - Continue oxygen supplements, as needed DuoNeb bronchodilator treatment, -Home inhalers including Breo, Singulair,  will be continued  Hypertension - Currently hypotensive likely due to sepsis -We will holding home BP meds: Which includes lisinopril, HCTZ, Lasix, Coreg     Blood Cultures x 2 >> NGT Urine Culture  >>> NGT   Sputum Culture >> NGT         ------------------------------------------------------------------------------------------------------------------------------------------------  DVT prophylaxis:  Place and maintain sequential compression device Start: 03/15/21 0810 TED hose Start: 03/14/21 1432 SCDs Start: 03/14/21 1432   Code Status:   Code Status: Full Code  Family Communication: No family member present at bedside- attempt will be made to update daily The above findings and plan of care has been discussed with patient (and family)  in detail,  they expressed understanding and agreement of above. -Advance care planning has been discussed.   Admission status:   Status is: Inpatient Remains inpatient appropriate because: Treating sepsis, hypertension, anemia, leukocytosis, AKI,             Procedures:   No admission procedures for hospital encounter.   Antimicrobials:  Anti-infectives (From admission, onward)    Start     Dose/Rate Route Frequency Ordered Stop   03/16/21 1600  vancomycin (VANCOREADY) IVPB 1250 mg/250 mL  Status:  Discontinued       See Hyperspace for full Linked Orders Report.   1,250 mg 166.7 mL/hr over 90 Minutes Intravenous Every 48 hours 03/14/21 1451 03/15/21 1056   03/16/21 1600  vancomycin (VANCOREADY) IVPB 1500 mg/300 mL  Status:  Discontinued       See Hyperspace for full Linked Orders Report.   1,500 mg 150 mL/hr over 120 Minutes Intravenous Every 48 hours 03/15/21  1056 03/15/21 1058   03/16/21 1000  vancomycin (VANCOREADY) IVPB 1500 mg/300 mL        1,500 mg 150 mL/hr over 120 Minutes Intravenous Every 48 hours 03/15/21 1058     03/15/21 1300  cefTRIAXone (ROCEPHIN) 2 g in sodium chloride 0.9 % 100 mL IVPB        2 g 200 mL/hr over 30 Minutes Intravenous Every 24 hours 03/14/21 1451     03/14/21 1600  vancomycin (VANCOREADY) IVPB 1500 mg/300 mL  Status:  Discontinued       See Hyperspace for full Linked Orders Report.   1,500  mg 150 mL/hr over 120 Minutes Intravenous  Once 03/14/21 1451 03/15/21 1058   03/14/21 1500  metroNIDAZOLE (FLAGYL) IVPB 500 mg        500 mg 100 mL/hr over 60 Minutes Intravenous Every 12 hours 03/14/21 1437 03/21/21 1459   03/14/21 1445  aztreonam (AZACTAM) 2 g in sodium chloride 0.9 % 100 mL IVPB  Status:  Discontinued        2 g 200 mL/hr over 30 Minutes Intravenous  Once 03/14/21 1437 03/14/21 1442   03/14/21 1445  vancomycin (VANCOCIN) IVPB 1000 mg/200 mL premix  Status:  Discontinued        1,000 mg 200 mL/hr over 60 Minutes Intravenous  Once 03/14/21 1437 03/14/21 1451   03/14/21 1415  vancomycin (VANCOCIN) IVPB 1000 mg/200 mL premix  Status:  Discontinued        1,000 mg 200 mL/hr over 60 Minutes Intravenous  Once 03/14/21 1401 03/14/21 1623   03/14/21 1415  piperacillin-tazobactam (ZOSYN) IVPB 3.375 g  Status:  Discontinued        3.375 g 100 mL/hr over 30 Minutes Intravenous  Once 03/14/21 1401 03/14/21 1437   03/14/21 1245  cefTRIAXone (ROCEPHIN) 2 g in sodium chloride 0.9 % 100 mL IVPB        2 g 200 mL/hr over 30 Minutes Intravenous  Once 03/14/21 1235 03/14/21 1437   03/14/21 1245  metroNIDAZOLE (FLAGYL) IVPB 500 mg        500 mg 100 mL/hr over 60 Minutes Intravenous  Once 03/14/21 1235 03/14/21 1437        Medication:   sodium chloride   Intravenous Once   carvedilol  12.5 mg Oral BID WC   fluticasone  2 spray Each Nare Daily   fluticasone furoate-vilanterol  1 puff Inhalation Daily   ipratropium  0.5 mg Nebulization TID   levalbuterol  0.63 mg Nebulization TID   montelukast  10 mg Oral QHS   sodium chloride flush  3 mL Intravenous Q12H   sodium chloride flush  3 mL Intravenous Q12H    sodium chloride, acetaminophen **OR** acetaminophen, bisacodyl, hydrALAZINE, HYDROmorphone (DILAUDID) injection, ipratropium, levalbuterol, nitroGLYCERIN, ondansetron **OR** ondansetron (ZOFRAN) IV, oxyCODONE, senna-docusate, sodium chloride flush, traZODone   Objective:    Vitals:   03/15/21 0700 03/15/21 0730 03/15/21 0800 03/15/21 0843  BP: (!) 89/46 120/60 (!) 132/119   Pulse: 75 87 86   Resp: (!) 34 (!) 29 20   Temp:      TempSrc:      SpO2: 97% 92% 93% 99%  Weight:      Height:        Intake/Output Summary (Last 24 hours) at 03/15/2021 1100 Last data filed at 03/15/2021 0557 Gross per 24 hour  Intake 3000.18 ml  Output 200 ml  Net 2800.18 ml   Filed Weights   03/14/21 1201  Weight: 71 kg  Examination:   Physical Exam  Constitution:  Alert, cooperative, no distress,  Appears calm and comfortable  Psychiatric:   Normal and stable mood and affect, cognition intact,   HEENT:        Normocephalic, PERRL, otherwise with in Normal limits  Chest:         Chest symmetric Cardio vascular:  S1/S2, RRR, No murmure, No Rubs or Gallops  pulmonary: Clear to auscultation bilaterally, respirations unlabored, negative wheezes / crackles Abdomen: Soft, non-tender, non-distended, bowel sounds,no masses, no organomegaly Muscular skeletal: Limited exam - in bed, able to move all 4 extremities,   Neuro: CNII-XII intact. , normal motor and sensation, reflexes intact  Extremities: No pitting edema lower extremities, +2 pulses  Skin: Dry, warm to touch, negative for any Rashes, No open wounds Wounds: per nursing documentation   ------------------------------------------------------------------------------------------------------------------------------------------    LABs:  CBC Latest Ref Rng & Units 03/15/2021 03/14/2021 03/10/2021  WBC 4.0 - 10.5 K/uL 32.7(H) 33.2(H) 23.1(H)  Hemoglobin 13.0 - 17.0 g/dL 8.0(L) 8.6(L) 9.9(L)  Hematocrit 39.0 - 52.0 % 25.0(L) 26.6(L) 29.6(L)  Platelets 150 - 400 K/uL 63(L) 67(L) 79(L)   CMP Latest Ref Rng & Units 03/15/2021 03/14/2021 02/28/2021  Glucose 70 - 99 mg/dL 110(H) 141(H) 119(H)  BUN 8 - 23 mg/dL 31(H) 35(H) 26(H)  Creatinine 0.61 - 1.24 mg/dL 1.56(H) 2.11(H) 1.38(H)  Sodium 135 - 145 mmol/L 136 134(L) 135   Potassium 3.5 - 5.1 mmol/L 3.9 3.5 4.1  Chloride 98 - 111 mmol/L 109 106 108  CO2 22 - 32 mmol/L 21(L) 22 22  Calcium 8.9 - 10.3 mg/dL 7.6(L) 8.1(L) 8.0(L)  Total Protein 6.5 - 8.1 g/dL 5.8(L) 6.6 6.9  Total Bilirubin 0.3 - 1.2 mg/dL 0.8 1.5(H) 0.8  Alkaline Phos 38 - 126 U/L 47 48 50  AST 15 - 41 U/L 17 18 11(L)  ALT 0 - 44 U/L 21 21 13        Micro Results Recent Results (from the past 240 hour(s))  Resp Panel by RT-PCR (Flu A&B, Covid) Nasopharyngeal Swab     Status: None   Collection Time: 03/14/21  8:02 AM   Specimen: Nasopharyngeal Swab; Nasopharyngeal(NP) swabs in vial transport medium  Result Value Ref Range Status   SARS Coronavirus 2 by RT PCR NEGATIVE NEGATIVE Final    Comment: (NOTE) SARS-CoV-2 target nucleic acids are NOT DETECTED.  The SARS-CoV-2 RNA is generally detectable in upper respiratory specimens during the acute phase of infection. The lowest concentration of SARS-CoV-2 viral copies this assay can detect is 138 copies/mL. A negative result does not preclude SARS-Cov-2 infection and should not be used as the sole basis for treatment or other patient management decisions. A negative result may occur with  improper specimen collection/handling, submission of specimen other than nasopharyngeal swab, presence of viral mutation(s) within the areas targeted by this assay, and inadequate number of viral copies(<138 copies/mL). A negative result must be combined with clinical observations, patient history, and epidemiological information. The expected result is Negative.  Fact Sheet for Patients:  EntrepreneurPulse.com.au  Fact Sheet for Healthcare Providers:  IncredibleEmployment.be  This test is no t yet approved or cleared by the [place] FDA and  has been authorized for detection and/or diagnosis of SARS-CoV-2 by FDA under an Emergency Use Authorization (EUA). This EUA will remain  in effect (meaning this test  can be used) for the duration of the COVID-19 declaration under Section 564(b)(1) of the Act, 21 U.S.C.section 360bbb-3(b)(1), unless the authorization is terminated  or  revoked sooner.       Influenza A by PCR NEGATIVE NEGATIVE Final   Influenza B by PCR NEGATIVE NEGATIVE Final    Comment: (NOTE) The Xpert Xpress SARS-CoV-2/FLU/RSV plus assay is intended as an aid in the diagnosis of influenza from Nasopharyngeal swab specimens and should not be used as a sole basis for treatment. Nasal washings and aspirates are unacceptable for Xpert Xpress SARS-CoV-2/FLU/RSV testing.  Fact Sheet for Patients: EntrepreneurPulse.com.au  Fact Sheet for Healthcare Providers: IncredibleEmployment.be  This test is not yet approved or cleared by the [place] FDA and has been authorized for detection and/or diagnosis of SARS-CoV-2 by FDA under an Emergency Use Authorization (EUA). This EUA will remain in effect (meaning this test can be used) for the duration of the COVID-19 declaration under Section 564(b)(1) of the Act, 21 U.S.C. section 360bbb-3(b)(1), unless the authorization is terminated or revoked.  Performed at [facility], [address], [place], [place] [address]   Blood Culture (routine x 2)     Status: None (Preliminary result)   Collection Time: 03/14/21 12:35 PM   Specimen: Left Antecubital; Blood  Result Value Ref Range Status   Specimen Description   Final    LEFT ANTECUBITAL BOTTLES DRAWN AEROBIC AND ANAEROBIC   Special Requests   Final    Blood Culture adequate volume Performed at [facility], [address] [place], [place] [address]    Culture PENDING  Incomplete   Report Status PENDING  Incomplete  Blood Culture (routine x 2)     Status: None (Preliminary result)   Collection Time: 03/14/21 12:55 PM   Specimen: BLOOD RIGHT HAND  Result Value Ref Range Status   Specimen Description   Final    BLOOD RIGHT HAND BOTTLES DRAWN  AEROBIC AND ANAEROBIC   Special Requests   Final    Blood Culture adequate volume Performed at [facility], [address], [place],  [address]    Culture PENDING  Incomplete   Report Status PENDING  Incomplete    Radiology Reports DG CHEST PORT 1 VIEW  Result Date: 03/15/2021 CLINICAL DATA:  Being treated for sepsis, presenting with bibasilar crackles. EXAM: PORTABLE CHEST 1 VIEW COMPARISON:  March 14, 2021 FINDINGS: Multiple sternal wires and vascular clips are seen. Stable, mild to moderate severity scarring and/or atelectasis is seen within the bilateral lung bases and mid left lung. There is no evidence of a pleural effusion or pneumothorax. The cardiac silhouette is mildly enlarged and unchanged in size. The visualized skeletal structures are unremarkable. IMPRESSION: Stable cardiomegaly with stable bibasilar and mid left lung scarring and/or atelectasis. Electronically Signed   By: Virgina Norfolk M.D.   On: 03/15/2021 04:09   DG Chest Port 1 View  Result Date: 03/14/2021 CLINICAL DATA:  Questionable sepsis.  Chest pain. EXAM: PORTABLE CHEST 1 VIEW COMPARISON:  Chest radiograph dated February 28, 2021. FINDINGS: The heart is enlarged. Atherosclerotic calcification of aortic arch. Evidence of prior coronary artery bypass grafting. Low lung volumes with bibasilar atelectasis. Bandlike atelectasis in the left mid lung is unchanged. IMPRESSION: 1. Stable cardiomegaly. 2. Low lung volumes with bandlike atelectasis in the left mid lung, unchanged. No evidence of pneumonia or large pleural effusion. Electronically Signed   By: Keane Police D.O.   On: 03/14/2021 13:19    SIGNED: Deatra James, MD, FHM. [facility],  Pager (please use [email or web address] to page/text) Please use [facility] Secure Chat for non-urgent communication (7AM-7PM)  If 7PM-7AM, please contact night-coverage [email or web address], 03/15/2021, 11:00  AM

## 2021-03-15 NOTE — Assessment & Plan Note (Signed)
-   Continue oxygen supplements, as needed DuoNeb bronchodilator treatment, ?-Home inhalers including Breo, Singulair,  will be continued ?

## 2021-03-15 NOTE — Assessment & Plan Note (Addendum)
-  No residual or new focal neurological findings ?- Currently anemic, thrombocytopenic ?-Holding Coumadin >> resuming Coumadin ?-Stable we will monitor closely ? ?

## 2021-03-15 NOTE — Assessment & Plan Note (Addendum)
-   Currently hypotensive ?-Status post IV fluid resuscitation due to sepsis..  DC'd ?-Resuming home dose Lasix now ?-Last Echo: 03/24/2018 EJ EF 40-45%, mild LVH, A-fib, mild/moderate systolic dysfunction ?

## 2021-03-15 NOTE — Assessment & Plan Note (Addendum)
-  Resolved ? ?- Met sepsis physiology on admission RR 30, BP 77/ 46 ?WBC 33.2, 67, creatinine 2.11, ?-Unknown etiology ?-UA nl  ?-Chest x-ray reviewed-clear ?-Lactic acid 3.4 >> 2.6 ? ?-Activated sepsis pathway: Aggressive IV fluid resuscitation with LR --- DC'd now ?-Broad-spectrum IV antibiotics Vanco and Zosyn initiated >> switching to Rocephin, Flagyl, vancomycin ? ?-Much improved sepsis physiology with exception of tachypnea -stable blood pressure, resolved lactic acidosis, improved leukocytosis ?-Cultures remain negative anticipating discontinuing IV antibiotics (Rocephin/Flagyl/Vanc) switching to p.o. Levaquin,  ?

## 2021-03-15 NOTE — Assessment & Plan Note (Addendum)
-   Much improved ?-BP has stabilized ?-Discontinue home medication of lisinopril and HCTZ due to hypotension and AKI ?Resuming home medication Lasix and Coreg ? ?

## 2021-03-15 NOTE — Progress Notes (Addendum)
Pharmacy Antibiotic Note ? [name] is a 76 y.o. male admitted on 03/14/2021 with  unknown source of infection .  Pharmacy has been consulted for Vancomycin dosing. ?Renal fxn improved ? ?Plan: ?Vancomycin '1500mg'$  IV  Q 48 hrs. Goal AUC 400-550. ?Expected AUC: 434 ?SCr used: 1.56 ?Ceftriaxone 2gm IV q24h ?Flagyl '500mg'$  IV q12h ?F/U cxs and clinical progress ?Monitor V/S, labs and levels as indicated ? ?Height: '5\' 2"'$  (157.5 cm) ?Weight: 71 kg (156 lb 8.4 oz) ?IBW/kg (Calculated) : 54.6 ? ?Temp (24hrs), Avg:97.6 ?F (36.4 ?C), Min:97.6 ?F (36.4 ?C), Max:97.6 ?F (36.4 ?C) ? ?Recent Labs  ?Lab 03/10/21 ?0800 03/14/21 ?1235 03/14/21 ?1457 03/15/21 ?0272  ?WBC 23.1* 33.2*  --  32.7*  ?CREATININE  --  2.11*  --  1.56*  ?LATICACIDVEN  --  3.4* 2.6*  --   ? ?  ?Estimated Creatinine Clearance: 34.3 mL/min (A) (by C-G formula based on SCr of 1.56 mg/dL (H)).   ? ?Allergies  ?Allergen Reactions  ? Augmentin [Amoxicillin-Pot Clavulanate] Diarrhea and Nausea And Vomiting  ? Codeine Nausea Only  ?  Pill form of codeine causes nausea  ? Statins Other (See Comments)  ?  Myalgia   ? ? ?Antimicrobials this admission: ?Vancomycin  3/4 >>  ?ceftriaxone 3/4 >>  ?Flagyl 3/4>> ? ?Microbiology results: ?3/4 BCx: pending ?3/4 UCx: pending  ? MRSA PCR:  ? ?Thank you for allowing pharmacy to be a part of this patient?s care. ? ?Vincent Carter, BS Pharm D, BCPS ?Clinical Pharmacist ?Pager [phone] ?03/15/2021 10:51 AM ? ?

## 2021-03-15 NOTE — ED Notes (Signed)
Patient transferred to room 8, Left a message with [name] (daughter-in law) of transfer. ?

## 2021-03-15 NOTE — Assessment & Plan Note (Addendum)
-  Not official diagnosis yet ?-Dr. Delton Coombes has arranged patient to follow-up at [facility] Dr. Jerrye Noble at this 03/20/21 Friday at 1 PM in his clinic. ? ?- Abnormal CBC with differential: Acute leukocytosis, thrombocytopenia ?-Recent work-up by oncologist Dr. Delton Coombes ?Including bone biopsy 04/07/2021 ?- case with Dr. Delton Coombes -he recommended supportive therapy IV fluids ruling out infection,  PRBC transfusion ?-If WBC > 50 K, consideration of treatment with hydroxyurea ? ?-Dr. Delton Coombes F/up  ? ?-On admission hemoglobin 8.6 >> 1U PRBC 3/4 >> 8.0 >> 1U PRBC 3/5 >>> 8.8 >> 8.6 ?

## 2021-03-16 DIAGNOSIS — E876 Hypokalemia: Secondary | ICD-10-CM | POA: Diagnosis present

## 2021-03-16 LAB — TYPE AND SCREEN
ABO/RH(D): O POS
Antibody Screen: NEGATIVE
Unit division: 0

## 2021-03-16 LAB — COMPREHENSIVE METABOLIC PANEL WITH GFR
ALT: 15 U/L (ref 0–44)
AST: 11 U/L — ABNORMAL LOW (ref 15–41)
Albumin: 2.4 g/dL — ABNORMAL LOW (ref 3.5–5.0)
Alkaline Phosphatase: 50 U/L (ref 38–126)
Anion gap: 7 (ref 5–15)
BUN: 30 mg/dL — ABNORMAL HIGH (ref 8–23)
CO2: 19 mmol/L — ABNORMAL LOW (ref 22–32)
Calcium: 7.6 mg/dL — ABNORMAL LOW (ref 8.9–10.3)
Chloride: 109 mmol/L (ref 98–111)
Creatinine, Ser: 1.27 mg/dL — ABNORMAL HIGH (ref 0.61–1.24)
GFR, Estimated: 58 mL/min — ABNORMAL LOW
Glucose, Bld: 100 mg/dL — ABNORMAL HIGH (ref 70–99)
Potassium: 3.4 mmol/L — ABNORMAL LOW (ref 3.5–5.1)
Sodium: 135 mmol/L (ref 135–145)
Total Bilirubin: 1.2 mg/dL (ref 0.3–1.2)
Total Protein: 5.6 g/dL — ABNORMAL LOW (ref 6.5–8.1)

## 2021-03-16 LAB — BPAM RBC
Blood Product Expiration Date: 202304092359
ISSUE DATE / TIME: 202303051111
Unit Type and Rh: 5100

## 2021-03-16 LAB — CBC WITH DIFFERENTIAL/PLATELET
Basophils Absolute: 0 10*3/uL (ref 0.0–0.1)
Basophils Relative: 0 %
Blasts: 1 %
Eosinophils Absolute: 0 10*3/uL (ref 0.0–0.5)
Eosinophils Relative: 0 %
HCT: 26.8 % — ABNORMAL LOW (ref 39.0–52.0)
Hemoglobin: 8.8 g/dL — ABNORMAL LOW (ref 13.0–17.0)
Lymphocytes Relative: 29 %
Lymphs Abs: 6.7 10*3/uL — ABNORMAL HIGH (ref 0.7–4.0)
MCH: 34 pg (ref 26.0–34.0)
MCHC: 32.8 g/dL (ref 30.0–36.0)
MCV: 103.5 fL — ABNORMAL HIGH (ref 80.0–100.0)
Monocytes Absolute: 15.2 10*3/uL — ABNORMAL HIGH (ref 0.1–1.0)
Monocytes Relative: 66 %
Neutro Abs: 0.9 10*3/uL — ABNORMAL LOW (ref 1.7–7.7)
Neutrophils Relative %: 4 %
Platelets: 58 10*3/uL — ABNORMAL LOW (ref 150–400)
RBC: 2.59 MIL/uL — ABNORMAL LOW (ref 4.22–5.81)
RDW: 22.1 % — ABNORMAL HIGH (ref 11.5–15.5)
WBC: 23.1 10*3/uL — ABNORMAL HIGH (ref 4.0–10.5)
nRBC: 0 % (ref 0.0–0.2)

## 2021-03-16 LAB — URINE CULTURE: Culture: <10000 — AB

## 2021-03-16 LAB — PROCALCITONIN: Procalcitonin: 0.27 ng/mL

## 2021-03-16 LAB — GLUCOSE, CAPILLARY: Glucose-Capillary: 116 mg/dL — ABNORMAL HIGH (ref 70–99)

## 2021-03-16 MED ORDER — SODIUM CHLORIDE 0.9 % IV SOLN
2.0000 g | INTRAVENOUS | Status: AC
  Administered 2021-03-16: 2 g via INTRAVENOUS
  Filled 2021-03-16: qty 20

## 2021-03-16 MED ORDER — WARFARIN SODIUM 2.5 MG PO TABS
2.5000 mg | ORAL_TABLET | Freq: Once | ORAL | Status: AC
  Administered 2021-03-16: 2.5 mg via ORAL
  Filled 2021-03-16: qty 1

## 2021-03-16 MED ORDER — LEVOFLOXACIN 750 MG PO TABS: 750.0000 mg | ORAL_TABLET | Freq: Every day | ORAL | Status: DC

## 2021-03-16 MED ORDER — LEVOFLOXACIN 500 MG PO TABS
500.0000 mg | ORAL_TABLET | Freq: Every day | ORAL | Status: DC
  Administered 2021-03-17: 500 mg via ORAL
  Filled 2021-03-16: qty 1

## 2021-03-16 MED ORDER — WARFARIN - PHARMACIST DOSING INPATIENT: Freq: Every day | Status: DC

## 2021-03-16 MED ORDER — POTASSIUM CHLORIDE CRYS ER 20 MEQ PO TBCR
40.0000 meq | EXTENDED_RELEASE_TABLET | Freq: Once | ORAL | Status: AC
Start: 2021-03-16 — End: 2021-03-16
  Administered 2021-03-16: 40 meq via ORAL
  Filled 2021-03-16: qty 2

## 2021-03-16 NOTE — Consult Note (Signed)
[facility] Consultation Oncology  Name: Vincent Carter      MRN: [id number]    Location: IC07/IC07-01  Date: 03/16/2021 Time:5:09 PM   REFERRING PHYSICIAN: Dr. Roger Shelter  REASON FOR CONSULT: Leukocytosis, anemia and thrombocytopenia    DIAGNOSIS: Acute leukemia, ambiguous morphology.  HISTORY OF PRESENT ILLNESS: Vincent Carter is a very pleasant white male who is seen in consultation today at the request of Dr. Roger Shelter.  He was evaluated by me for abnormal CBC with thrombocytopenia on 03/03/2021.  A bone marrow biopsy was ordered.  Nutritional deficiency work-up showed B12 was low which was replenished.  He came to the hospital on 03/14/2021 with weakness and chest pain.  He was also found to be hypotensive.  He was admitted to the hospital and was started on broad-spectrum antibiotics.  He also received 2 units of PRBC.  His blood pressure has improved.  His respiratory rate is around 25/min.  He is not on oxygen.  Patient has a history of prostate cancer and underwent radioactive seed implantation in 2016.  PAST MEDICAL HISTORY:   Past Medical History:  Diagnosis Date   Arthritis    ASCVD (arteriosclerotic cardiovascular disease) cardiologist--  is now dr Roderic Palau branch (was dr [name])    CABG in 1997 -- LIMA to LAD;  cath 07-17-2012 PCI and BMStenting;   RE-DO CABG X4  in  07-24-2004 three-vessel disease; cath in 08-24-2004 post CABG ef 40-45% w/ anteroapical hypokinesis; occlusion SVG  to  RI; patent RIMA to LAD; unsuccessful PCI in totally occluded RI graft.;  last myoview 09-09-2013, no ischemia, moderate inferior wall defect, ef 49%   CHF (congestive heart failure) (HCC)    Chronic obstructive pulmonary disease ([place])    Diverticulosis of colon    Euthyroid goiter    with low TSH    History of colon polyps    hyperplastic 2011   History of diverticulitis    History of kidney stones    History of ST elevation myocardial infarction (STEMI)    07-17-2004   w/ extensive RV  infact per cath report   HOH (hard of hearing)    no hearing aid   Hypertension    Low TSH level    Clinically euthyroid   Peripheral vascular disease (HCC)    Decreased distal pulses and abnormal Doppler   Prostate cancer [facility]) urologist--  dr Irine Seal   T1c, Gleason 3+4=7,  PSA 4.71,  vol 38cc   Ptosis    Left, secondary to injury   S/p bare metal coronary artery stent    x3  to RCA  07-17-2004    ALLERGIES: Allergies  Allergen Reactions   Augmentin [Amoxicillin-Pot Clavulanate] Diarrhea and Nausea And Vomiting   Codeine Nausea Only    Pill form of codeine causes nausea   Statins Other (See Comments)    Myalgia       MEDICATIONS: I have reviewed the patient's current medications.     PAST SURGICAL HISTORY Past Surgical History:  Procedure Laterality Date   APPENDECTOMY  1973   CARDIAC CATHETERIZATION  08-24-2004  dr Lia Foyer   post re-do CABG/  Unsuccessful attempt PCI of clotted SVG to RI/  anteroapical segment severely hypokinetic,  ef 40-45%/  fresh occlusion SVG to RI,  continued patency of SVG to PAD and RIMA to LAD /   dLM 80%   CARDIOVASCULAR STRESS TEST  09-09-2013  dr Roderic Palau branch   no reversible ischemia,  moderately large defect noted in the inferior  wall extending from  mid ventricle to apex also involves true ventricle apex and anteroapical wall/  hypokinesis of inferior wall in region of prior infarct/scarring/  LVEF 49%   CATARACT EXTRACTION W/ INTRAOCULAR LENS  IMPLANT, BILATERAL  2006   COLONOSCOPY W/ POLYPECTOMY  12-26-2009   CORONARY ANGIOPLASTY WITH STENT PLACEMENT  07-17-2004   dr Sabino Snipes   PCI w/ BMS x3 to RCA and balloon pump placement/  LVSF after revascularization , ef 65% w/ mild inferior hypokinesis/  dLM  70% extending into origins of LAD (70% ostial, 80% mid, D1 branch 90% ostial)  CFX (80% ostial OM, 70% after first branch) /   99% mRCA stented and no residual;  remains 80% origin of posterior LV branch/  most apical portion of PDA was  embolized    CORONARY ARTERY BYPASS GRAFT  1997   dr Merleen Nicely   LIMA to LAD   CORONARY ARTERY BYPASS GRAFT  07-24-2004  dr gerhardt   RIMA to LAD,  SVG to OM,  SVG to PDA,  SVG to RI   EYE SURGERY Left 1996   repair ocular injury   ORIF RIGHT TIB-FIB FX  1990's   PROSTATE BIOPSY     RADIOACTIVE SEED IMPLANT N/A 05/02/2014   Procedure: RADIOACTIVE SEED IMPLANT;  Surgeon: Irine Seal, MD;  Location: [facility];  Service: Urology;  Laterality: N/A;   TONSILLECTOMY  age 76   TOTAL HIP ARTHROPLASTY Left 1983   RECONSTRUCTION/ THA/  SKIN AND BONE GRAFTS--  pt fell from 5 stories off [place]   TRANSTHORACIC ECHOCARDIOGRAM  [id number]   diastolic dysfunction/  ef 45-50%/  mild AV calcification without stenosis,  mild AR/  mild LAE and RAE/  trivial MR and TR      FAMILY HISTORY: Family History  Problem Relation Age of Onset   Clotting disorder Mother    Heart attack Mother    Lung cancer Father    Cancer Brother        prostate    SOCIAL HISTORY:  reports that he quit smoking about 2 years ago. His smoking use included cigarettes. He started smoking about 62 years ago. He has a 55.00 pack-year smoking history. He has never used smokeless tobacco. He reports that he does not drink alcohol and does not use drugs.  PERFORMANCE STATUS: The patient's performance status is 2 - Symptomatic, <50% confined to bed  PHYSICAL EXAM: Most Recent Vital Signs: Blood pressure 118/68, pulse 88, temperature 99.1 F (37.3 C), temperature source Oral, resp. rate 20, height 5' 2"  (1.575 m), weight 156 lb 15.5 oz (71.2 kg), SpO2 97 %. BP 118/68    Pulse 88    Temp 99.1 F (37.3 C) (Oral)    Resp 20    Ht 5' 2"  (1.575 m)    Wt 156 lb 15.5 oz (71.2 kg)    SpO2 97%    BMI 28.71 kg/m  General appearance: alert, cooperative, and appears stated age Lungs: clear to auscultation bilaterally Heart: regular rate and rhythm Abdomen:  Soft, nontender with no palpable masses. Extremities:  No  edema or cyanosis. Lymph nodes:  No palpable adenopathy. Neurologic: Grossly normal  LABORATORY DATA:  Results for orders placed or performed during the hospital encounter of 03/14/21 (from the past 48 hour(s))  Troponin I (High Sensitivity)     Status: Abnormal   Collection Time: 03/14/21  5:13 PM  Result Value Ref Range   Troponin I (High Sensitivity) 34 (H) <18 ng/L  Comment: (NOTE) Elevated high sensitivity troponin I (hsTnI) values and significant  changes across serial measurements may suggest ACS but many other  chronic and acute conditions are known to elevate hsTnI results.  Refer to the "Links" section for chest pain algorithms and additional  guidance. Performed at [facility], [address], [place], [place] [address]   Urinalysis, Routine w reflex microscopic Urine, Clean Catch     Status: None   Collection Time: 03/14/21  5:29 PM  Result Value Ref Range   Color, Urine YELLOW YELLOW   APPearance CLEAR CLEAR   Specific Gravity, Urine 1.019 1.005 - 1.030   pH 5.0 5.0 - 8.0   Glucose, UA NEGATIVE NEGATIVE mg/dL   Hgb urine dipstick NEGATIVE NEGATIVE   Bilirubin Urine NEGATIVE NEGATIVE   Ketones, ur NEGATIVE NEGATIVE mg/dL   Protein, ur NEGATIVE NEGATIVE mg/dL   Nitrite NEGATIVE NEGATIVE   Leukocytes,Ua NEGATIVE NEGATIVE    Comment: Performed at [facility], [address] [place], [place] [address]  Urine Culture     Status: Abnormal   Collection Time: 03/14/21  5:29 PM   Specimen: Urine, Clean Catch  Result Value Ref Range   Specimen Description      URINE, CLEAN CATCH Performed at [facility], [address], [place], [place] [address]    Special Requests      NONE Performed at [facility], [address], [place], [place] [address]    Culture (A)     <10,000 COLONIES/mL INSIGNIFICANT GROWTH Performed at [facility] [address] [place], [place] [address]    Report Status 03/16/2021 FINAL   Troponin I (High Sensitivity)     Status: Abnormal    Collection Time: 03/14/21  7:18 PM  Result Value Ref Range   Troponin I (High Sensitivity) 40 (H) <18 ng/L    Comment: (NOTE) Elevated high sensitivity troponin I (hsTnI) values and significant  changes across serial measurements may suggest ACS but many other  chronic and acute conditions are known to elevate hsTnI results.  Refer to the "Links" section for chest pain algorithms and additional  guidance. Performed at [facility], [address] [place], [place] [address]   Protime-INR     Status: Abnormal   Collection Time: 03/15/21  5:20 AM  Result Value Ref Range   Prothrombin Time 26.2 (H) 11.4 - 15.2 seconds   INR 2.4 (H) 0.8 - 1.2    Comment: (NOTE) INR goal varies based on device and disease states. Performed at [facility], [address] [place], [place] [address]   Comprehensive metabolic panel     Status: Abnormal   Collection Time: 03/15/21  5:20 AM  Result Value Ref Range   Sodium 136 135 - 145 mmol/L   Potassium 3.9 3.5 - 5.1 mmol/L   Chloride 109 98 - 111 mmol/L   CO2 21 (L) 22 - 32 mmol/L   Glucose, Bld 110 (H) 70 - 99 mg/dL    Comment: Glucose reference range applies only to samples taken after fasting for at least 8 hours.   BUN 31 (H) 8 - 23 mg/dL   Creatinine, Ser 1.56 (H) 0.61 - 1.24 mg/dL   Calcium 7.6 (L) 8.9 - 10.3 mg/dL   Total Protein 5.8 (L) 6.5 - 8.1 g/dL   Albumin 2.5 (L) 3.5 - 5.0 g/dL   AST 17 15 - 41 U/L   ALT 21 0 - 44 U/L   Alkaline Phosphatase 47 38 - 126 U/L  Total Bilirubin 0.8 0.3 - 1.2 mg/dL   GFR, Estimated 45 (L) >60 mL/min    Comment: (NOTE) Calculated using the CKD-EPI Creatinine Equation (2021)    Anion gap 6 5 - 15    Comment: Performed at [facility], [address], [place], [place] [address]  CBC with Differential/Platelet     Status: Abnormal   Collection Time: 03/15/21  5:20 AM  Result Value Ref Range   WBC 32.7 (H) 4.0 - 10.5 K/uL   RBC 2.30 (L) 4.22 - 5.81 MIL/uL   Hemoglobin 8.0 (L) 13.0 - 17.0 g/dL   HCT  25.0 (L) 39.0 - 52.0 %   MCV 108.7 (H) 80.0 - 100.0 fL   MCH 34.8 (H) 26.0 - 34.0 pg   MCHC 32.0 30.0 - 36.0 g/dL   RDW 19.7 (H) 11.5 - 15.5 %   Platelets 63 (L) 150 - 400 K/uL    Comment: SPECIMEN CHECKED FOR CLOTS Immature Platelet Fraction may be clinically indicated, consider ordering this additional test [id number] REPEATED TO VERIFY PLATELET COUNT CONFIRMED BY SMEAR    nRBC 0.1 0.0 - 0.2 %   Neutrophils Relative % 6 %   Neutro Abs 2.0 1.7 - 7.7 K/uL   Lymphocytes Relative 12 %   Lymphs Abs 3.9 0.7 - 4.0 K/uL   Monocytes Relative 71 %   Monocytes Absolute 23.2 (H) 0.1 - 1.0 K/uL   Eosinophils Relative 0 %   Eosinophils Absolute 0.0 0.0 - 0.5 K/uL   Basophils Relative 0 %   Basophils Absolute 0.0 0.0 - 0.1 K/uL   WBC Morphology See Note     Comment: VACUOLATED NEUTROPHILS BLASTS PRESENT    RBC Morphology MORPHOLOGY UNREMARKABLE    Smear Review PLATELET COUNT CONFIRMED BY SMEAR    Blasts 11 %   Ovalocytes PRESENT     Comment: Performed at [facility], [address] [place],  [address]  Procalcitonin - Baseline     Status: None   Collection Time: 03/15/21  5:20 AM  Result Value Ref Range   Procalcitonin 0.42 ng/mL    Comment:        Interpretation: PCT (Procalcitonin) <= 0.5 ng/mL: Systemic infection (sepsis) is not likely. Local bacterial infection is possible. (NOTE)       Sepsis PCT Algorithm           Lower Respiratory Tract                                      Infection PCT Algorithm    ----------------------------     ----------------------------         PCT < 0.25 ng/mL                PCT < 0.10 ng/mL          Strongly encourage             Strongly discourage   discontinuation of antibiotics    initiation of antibiotics    ----------------------------     -----------------------------       PCT 0.25 - 0.50 ng/mL            PCT 0.10 - 0.25 ng/mL               OR       >80% decrease in PCT            Discourage initiation of  antibiotics      Encourage discontinuation           of antibiotics    ----------------------------     -----------------------------         PCT >= 0.50 ng/mL              PCT 0.26 - 0.50 ng/mL               AND        <80% decrease in PCT             Encourage initiation of                                             antibiotics       Encourage continuation           of antibiotics    ----------------------------     -----------------------------        PCT >= 0.50 ng/mL                  PCT > 0.50 ng/mL               AND         increase in PCT                  Strongly encourage                                      initiation of antibiotics    Strongly encourage escalation           of antibiotics                                     -----------------------------                                           PCT <= 0.25 ng/mL                                                 OR                                        > 80% decrease in PCT                                      Discontinue / Do not initiate                                             antibiotics  Performed at [facility], [address] [place], [place] [address]   ABO/Rh     Status: None   Collection Time: 03/15/21  5:28  AM  Result Value Ref Range   ABO/RH(D)      O POS Performed at [facility], [address], [place], [place] [address]   Expectorated Sputum Assessment w Gram Stain, Rflx to Resp Cult     Status: None   Collection Time: 03/15/21  8:08 AM   Specimen: Sputum  Result Value Ref Range   Specimen Description SPUTUM    Special Requests NONE    Sputum evaluation      THIS SPECIMEN IS ACCEPTABLE FOR SPUTUM CULTURE Performed at [facility], [address], [place], [place] [address]    Report Status 03/15/2021 FINAL   Culture, Respiratory w Gram Stain     Status: None (Preliminary result)   Collection Time: 03/15/21  8:08 AM   Specimen: SPU  Result Value Ref Range   Specimen  Description      SPUTUM Performed at [facility], [address] [place], [place] [address]    Special Requests      NONE Reflexed from [id number] Performed at [facility], [address], [place], [place] [address]    Gram Stain      RARE SQUAMOUS EPITHELIAL CELLS PRESENT FEW GRAM POSITIVE COCCI IN CLUSTERS    Culture      TOO YOUNG TO READ Performed at [facility], [place] [address], [place], [place] [address]    Report Status PENDING   Type and screen [facility]     Status: None   Collection Time: 03/15/21  8:18 AM  Result Value Ref Range   ABO/RH(D) O POS    Antibody Screen NEG    Sample Expiration 03/18/2021,2359    Unit Number [id number]    Blood Component Type RBC LR PHER2    Unit division 00    Status of Unit ISSUED,FINAL    Transfusion Status OK TO TRANSFUSE    Crossmatch Result      Compatible Performed at [facility], [address], [place], [place] [address]   Prepare RBC (crossmatch)     Status: None   Collection Time: 03/15/21  8:18 AM  Result Value Ref Range   Order Confirmation      ORDER PROCESSED BY BLOOD BANK Performed at [facility], [address] [place], [place] [address]   CBG monitoring, ED     Status: Abnormal   Collection Time: 03/15/21  8:18 AM  Result Value Ref Range   Glucose-Capillary 130 (H) 70 - 99 mg/dL    Comment: Glucose reference range applies only to samples taken after fasting for at least 8 hours.  CBC     Status: Abnormal   Collection Time: 03/15/21  3:48 PM  Result Value Ref Range   WBC 35.7 (H) 4.0 - 10.5 K/uL   RBC 2.82 (L) 4.22 - 5.81 MIL/uL   Hemoglobin 9.2 (L) 13.0 - 17.0 g/dL   HCT 29.1 (L) 39.0 - 52.0 %   MCV 103.2 (H) 80.0 - 100.0 fL   MCH 32.6 26.0 - 34.0 pg   MCHC 31.6 30.0 - 36.0 g/dL   RDW 22.4 (H) 11.5 - 15.5 %   Platelets 67 (L) 150 - 400 K/uL    Comment: Immature Platelet Fraction may be clinically indicated, consider ordering this additional test [id number] CONSISTENT WITH PREVIOUS  RESULT    nRBC 0.0 0.0 - 0.2 %    Comment: Performed at [facility], [address], [place], [place] [address]  MRSA Next Gen by PCR, Nasal  Status: None   Collection Time: 03/15/21  4:25 PM   Specimen: Nasal Mucosa; Nasal Swab  Result Value Ref Range   MRSA by PCR Next Gen NOT DETECTED NOT DETECTED    Comment: (NOTE) The GeneXpert MRSA Assay (FDA approved for NASAL specimens only), is one component of a comprehensive MRSA colonization surveillance program. It is not intended to diagnose MRSA infection nor to guide or monitor treatment for MRSA infections. Test performance is not FDA approved in patients less than 54 years old. Performed at [facility], [address] [place], [place] [address]   Comprehensive metabolic panel     Status: Abnormal   Collection Time: 03/16/21  4:59 AM  Result Value Ref Range   Sodium 135 135 - 145 mmol/L   Potassium 3.4 (L) 3.5 - 5.1 mmol/L   Chloride 109 98 - 111 mmol/L   CO2 19 (L) 22 - 32 mmol/L   Glucose, Bld 100 (H) 70 - 99 mg/dL    Comment: Glucose reference range applies only to samples taken after fasting for at least 8 hours.   BUN 30 (H) 8 - 23 mg/dL   Creatinine, Ser 1.27 (H) 0.61 - 1.24 mg/dL   Calcium 7.6 (L) 8.9 - 10.3 mg/dL   Total Protein 5.6 (L) 6.5 - 8.1 g/dL   Albumin 2.4 (L) 3.5 - 5.0 g/dL   AST 11 (L) 15 - 41 U/L   ALT 15 0 - 44 U/L   Alkaline Phosphatase 50 38 - 126 U/L   Total Bilirubin 1.2 0.3 - 1.2 mg/dL   GFR, Estimated 58 (L) >60 mL/min    Comment: (NOTE) Calculated using the CKD-EPI Creatinine Equation (2021)    Anion gap 7 5 - 15    Comment: Performed at [facility], [address], [place], [place] [address]  CBC with Differential/Platelet     Status: Abnormal   Collection Time: 03/16/21  4:59 AM  Result Value Ref Range   WBC 23.1 (H) 4.0 - 10.5 K/uL   RBC 2.59 (L) 4.22 - 5.81 MIL/uL   Hemoglobin 8.8 (L) 13.0 - 17.0 g/dL   HCT 26.8 (L) 39.0 - 52.0 %   MCV 103.5 (H) 80.0 - 100.0 fL   MCH 34.0 26.0 -  34.0 pg   MCHC 32.8 30.0 - 36.0 g/dL   RDW 22.1 (H) 11.5 - 15.5 %   Platelets 58 (L) 150 - 400 K/uL    Comment: Immature Platelet Fraction may be clinically indicated, consider ordering this additional test [id number]    nRBC 0.0 0.0 - 0.2 %   Neutrophils Relative % 4 %   Neutro Abs 0.9 (L) 1.7 - 7.7 K/uL   Lymphocytes Relative 29 %   Lymphs Abs 6.7 (H) 0.7 - 4.0 K/uL   Monocytes Relative 66 %   Monocytes Absolute 15.2 (H) 0.1 - 1.0 K/uL   Eosinophils Relative 0 %   Eosinophils Absolute 0.0 0.0 - 0.5 K/uL   Basophils Relative 0 %   Basophils Absolute 0.0 0.0 - 0.1 K/uL   WBC Morphology SMUDGE CELLS    RBC Morphology MACROCYTOSIS    Smear Review PLATELET COUNT CONFIRMED BY SMEAR    Blasts 1 %   Smudge Cells PRESENT     Comment: Performed at [facility], [address] [place], [place] [address]  Procalcitonin - Baseline     Status: None   Collection Time: 03/16/21  4:59 AM  Result Value Ref Range   Procalcitonin 0.27 ng/mL  Comment:        Interpretation: PCT (Procalcitonin) <= 0.5 ng/mL: Systemic infection (sepsis) is not likely. Local bacterial infection is possible. (NOTE)       Sepsis PCT Algorithm           Lower Respiratory Tract                                      Infection PCT Algorithm    ----------------------------     ----------------------------         PCT < 0.25 ng/mL                PCT < 0.10 ng/mL          Strongly encourage             Strongly discourage   discontinuation of antibiotics    initiation of antibiotics    ----------------------------     -----------------------------       PCT 0.25 - 0.50 ng/mL            PCT 0.10 - 0.25 ng/mL               OR       >80% decrease in PCT            Discourage initiation of                                            antibiotics      Encourage discontinuation           of antibiotics    ----------------------------     -----------------------------         PCT >= 0.50 ng/mL              PCT 0.26 - 0.50  ng/mL               AND        <80% decrease in PCT             Encourage initiation of                                             antibiotics       Encourage continuation           of antibiotics    ----------------------------     -----------------------------        PCT >= 0.50 ng/mL                  PCT > 0.50 ng/mL               AND         increase in PCT                  Strongly encourage                                      initiation of antibiotics    Strongly encourage escalation           of antibiotics                                     -----------------------------  PCT <= 0.25 ng/mL                                                 OR                                        > 80% decrease in PCT                                      Discontinue / Do not initiate                                             antibiotics  Performed at [facility], [address], [place], [place] [address]   Glucose, capillary     Status: Abnormal   Collection Time: 03/16/21  7:53 AM  Result Value Ref Range   Glucose-Capillary 116 (H) 70 - 99 mg/dL    Comment: Glucose reference range applies only to samples taken after fasting for at least 8 hours.      RADIOGRAPHY: DG CHEST PORT 1 VIEW  Result Date: 03/15/2021 CLINICAL DATA:  Being treated for sepsis, presenting with bibasilar crackles. EXAM: PORTABLE CHEST 1 VIEW COMPARISON:  March 14, 2021 FINDINGS: Multiple sternal wires and vascular clips are seen. Stable, mild to moderate severity scarring and/or atelectasis is seen within the bilateral lung bases and mid left lung. There is no evidence of a pleural effusion or pneumothorax. The cardiac silhouette is mildly enlarged and unchanged in size. The visualized skeletal structures are unremarkable. IMPRESSION: Stable cardiomegaly with stable bibasilar and mid left lung scarring and/or atelectasis. Electronically Signed   By: Virgina Norfolk M.D.   On:  03/15/2021 04:09       PATHOLOGY: I have reviewed bone marrow biopsy reports.  ASSESSMENT and PLAN:  1.  Acute leukemia with ambiguous lineage: - His white count has been steadily going up since 03/14/2021, but improved to 23,000 today. - I have reviewed bone marrow biopsy results with the patient, his daughter at bedside. - Pathology: Blasts positive for CD34 and TdT but negative for lineage specific markers MPO, CD79 a and CD3.  By flow, blasts express CD34, CD38, CD1 1 7, CD123, CD200 and HLA-DR.  This was consistent with acute leukemia with ambiguous lineage.  Cytogenetics/NGS is pending. - Fibrinogen and LDH was normal.  PT and PTT were elevated as he was on Coumadin. - He does not have any bleeding issues. - I had a prolonged discussion with the patient and his daughter and rest of the family.  He would like to receive appropriate treatment for his leukemia.  I have suggested transfer to a tertiary leukemia center.  He is agreeable.  2.  Sepsis: - He presented with hypotension which has improved.  He continues to be afebrile.  White count since admission has improved.  Cultures so far have been negative. - Dr. Roger Shelter will discontinue IV antibiotics and change to oral antibiotics.  3.  Paroxysmal atrial fibrillation: - On Coumadin.  We will consider discontinuation of Coumadin if platelet count drops  below 50 K.   All questions were answered. The patient knows to call the clinic with any problems, questions or concerns. We can certainly see the patient much sooner if necessary.   Derek Jack

## 2021-03-16 NOTE — Assessment & Plan Note (Addendum)
-   Likely due to sepsis -- ?- resolved ?- S/P IV fluid resuscitation ?- ?

## 2021-03-16 NOTE — Progress Notes (Addendum)
ANTICOAGULATION CONSULT NOTE - Initial Consult ? ?Pharmacy Consult for Coumadin ?Indication: atrial fibrillation ? ?Allergies  ?Allergen Reactions  ? Augmentin [Amoxicillin-Pot Clavulanate] Diarrhea and Nausea And Vomiting  ? Codeine Nausea Only  ?  Pill form of codeine causes nausea  ? Statins Other (See Comments)  ?  Myalgia   ? ? ?Patient Measurements: ?Height: '5\' 2"'$  (157.5 cm) ?Weight: 71.2 kg (156 lb 15.5 oz) ?IBW/kg (Calculated) : 54.6 ? ?Vital Signs: ?Temp: 97.6 ?F (36.4 ?C) (03/06 0741) ?Temp Source: Oral (03/06 0741) ?BP: 108/56 (03/06 0900) ?Pulse Rate: 70 (03/06 0900) ? ?Labs: ?Recent Labs  ?  03/14/21 ?1235 03/14/21 ?1713 03/14/21 ?1918 03/15/21 ?7824 03/15/21 ?1548 03/16/21 ?0459  ?HGB 8.6*  --   --  8.0* 9.2* 8.8*  ?HCT 26.6*  --   --  25.0* 29.1* 26.8*  ?PLT 67*  --   --  63* 67* 58*  ?APTT 49*  --   --   --   --   --   ?LABPROT 23.8*  --   --  26.2*  --   --   ?INR 2.1*  --   --  2.4*  --   --   ?CREATININE 2.11*  --   --  1.56*  --  1.27*  ?TROPONINIHS  --  34* 40*  --   --   --   ? ? ?Estimated Creatinine Clearance: 42.2 mL/min (A) (by C-G formula based on SCr of 1.27 mg/dL (H)). ? ? ?Medical History: ?Past Medical History:  ?Diagnosis Date  ? Arthritis   ? ASCVD (arteriosclerotic cardiovascular disease) cardiologist--  is now dr Roderic Palau branch (was dr [name])   ? CABG in 1997 -- LIMA to LAD;  cath 07-17-2012 PCI and BMStenting;   RE-DO CABG X4  in  07-24-2004 three-vessel disease; cath in 08-24-2004 post CABG ef 40-45% w/ anteroapical hypokinesis; occlusion SVG  to  RI; patent RIMA to LAD; unsuccessful PCI in totally occluded RI graft.;  last myoview 09-09-2013, no ischemia, moderate inferior wall defect, ef 49%  ? CHF (congestive heart failure) ([place])   ? Chronic obstructive pulmonary disease (HCC)   ? Diverticulosis of colon   ? Euthyroid goiter   ? with low TSH   ? History of colon polyps   ? hyperplastic 2011  ? History of diverticulitis   ? History of kidney stones   ? History of ST  elevation myocardial infarction (STEMI)   ? 07-17-2004   w/ extensive RV infact per cath report  ? HOH (hard of hearing)   ? no hearing aid  ? Hypertension   ? Low TSH level   ? Clinically euthyroid  ? Peripheral vascular disease ([place])   ? Decreased distal pulses and abnormal Doppler  ? Prostate cancer [facility]) urologist--  dr Irine Seal  ? T1c, Gleason 3+4=7,  PSA 4.71,  vol 38cc  ? Ptosis   ? Left, secondary to injury  ? S/p bare metal coronary artery stent   ? x3  to RCA  07-17-2004  ? ? ?Medications:  ?Medications Prior to Admission  ?Medication Sig Dispense Refill Last Dose  ? carvedilol (COREG) 12.5 MG tablet Take 1 tablet (12.5 mg total) by mouth 2 (two) times daily with a meal. (Patient taking differently: Take 12.5 mg by mouth daily.) 180 tablet 0 03/14/2021  ? dextromethorphan-guaiFENesin (MUCINEX DM) 30-600 MG 12hr tablet Take 1 tablet by mouth 2 (two) times daily. (Patient taking differently: Take 1 tablet by mouth 2 (two) times daily as  needed.) 30 tablet 0 03/14/2021  ? fluticasone (FLONASE) 50 MCG/ACT nasal spray Place 2 sprays into both nostrils daily. 16 g 0   ? furosemide (LASIX) 20 MG tablet Take 1 tablet (20 mg total) by mouth daily. 90 tablet 3 03/14/2021  ? hydrochlorothiazide (HYDRODIURIL) 25 MG tablet Take 25 mg by mouth daily.   03/14/2021  ? ipratropium-albuterol (DUONEB) 0.5-2.5 (3) MG/3ML SOLN Take 3 mLs by nebulization every 6 (six) hours as needed. 360 mL 3   ? lisinopril (ZESTRIL) 2.5 MG tablet Take 1 tablet (2.5 mg total) by mouth daily. 30 tablet 11 03/14/2021  ? montelukast (SINGULAIR) 10 MG tablet TAKE 1 TABLET BY MOUTH AT BEDTIME 30 tablet 6 03/14/2021  ? Nebulizer MISC Please provide 1 nebulizer machine and all supplies. Diagnosis: J44.1, Office number: [phone] 1 each 0 03/14/2021  ? nitroGLYCERIN (NITROSTAT) 0.4 MG SL tablet PLACE 1 TABLET UNDER TONGUE EVERY 5 MINUTES AS NEEDED FOR CHEST PAIN. 25 tablet 3   ? Omega-3 Fatty Acids (FISH OIL) 300 MG CAPS Take 2 capsules by mouth 2 (two) times  daily.   03/14/2021  ? SYMBICORT 80-4.5 MCG/ACT inhaler SMARTSIG:2 Puff(s) Via Inhaler Morning-Evening   03/14/2021  ? warfarin (COUMADIN) 2.5 MG tablet Take 1.5 tablets (3.75 mg total) every Monday, Wednesday, Friday, and Saturday, and 1 tablet (2.5 mg) on other days 40 tablet 6 03/14/2021  ? clindamycin (CLEOCIN) 300 MG capsule Take 1 capsule (300 mg total) by mouth 4 (four) times daily. X 7 days (Patient not taking: Reported on 03/15/2021) 28 capsule 0 Completed Course  ? potassium chloride SA (KLOR-CON M) 20 MEQ tablet Take 1 tablet (20 mEq total) by mouth 2 (two) times daily for 3 days. 6 tablet 0   ? predniSONE (DELTASONE) 10 MG tablet Take q day 6,5,4,3,2,1 (Patient not taking: Reported on 03/15/2021) 21 tablet 0 Completed Course  ? ? ?Assessment: ?Patient admitted with sepsis. Abnormal CBC with differential: Acute leukocytosis, thrombocytopenia. Patient with recent work-up by oncologist Dr. 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 Patient on chronic coumadin tx for afib. Was holding coumadin for thrombocytopenia since admission .Last dose of coumadin on 3/4. Now MD  wants to continue coumadin as long as platelets> 50K.  ?3/5 INR= 2.4 ?3/6 plt 58 ?Patient on flagyl so will only give 2.'5mg'$  since DI and can increase effect. ? ?Home dose is 2.'5mg'$  every Sun, Tue, Thurs; 3.'75mg'$  every Mon, Wed, Fri, and Saturday ? ?Goal of Therapy:  ?INR 2-3 ?Monitor platelets by anticoagulation protocol: Yes ?  ?Plan:  ?Coumadin 2.'5mg'$  po x 1 today ?PT-INR daily ?CBC daily for plt ? ?Isac Sarna, BS Pharm D, BCPS ?Clinical Pharmacist ?Pager [phone] ?03/16/2021,11:13 AM ? ? ?

## 2021-03-16 NOTE — Progress Notes (Signed)
Patient moved to room 333 by wheelchair with Joellen Jersey, RN. Alert with no distress noted when leaving ICU.  ?

## 2021-03-16 NOTE — Progress Notes (Signed)
Gave patient IS.  Explained usage to patient.  Patient able to achieve 1273m X10 with good patient effort.  Left at bedside. ?

## 2021-03-16 NOTE — Progress Notes (Signed)
Report called to [facility], RN on unit 300. Patient will be moved to room 333.  ?

## 2021-03-16 NOTE — Assessment & Plan Note (Signed)
-   Likely associated with sepsis versus leukemia with acute reaction ?-Monitoring for any overt bleeding ?-According to Dr. Delton Coombes as long as platelets > 50 K ?We can resume Coumadin ? ?

## 2021-03-16 NOTE — Progress Notes (Signed)
PROGRESS NOTE    Patient: Hubbard Robinson                            PCP: [name], MD                    DOB: Jun 11, 1945            DOA: 03/14/2021 YVO:[id number]             DOS: 03/16/2021, 9:27 AM   LOS: 2 days   Date of Service: The patient was seen and examined on 03/16/2021  Subjective:   The patient was seen and examined this morning, stable no acute distress Still requiring 2 L of oxygen, satting 100% now Blood pressure has stabilized Major issues overnight  Brief Narrative:   Mr. 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 presenting today with generalized weakness, chest pain.  According to the family symptoms started last night, slept with heating pad on his chest, he was asymptomatic this morning until breakfast time When he was  found to be altered, confused while eating, lightheaded, dizziness, and hypotensive.  ED work-up: Blood pressure (!) 85/46, pulse 66, temperature 97.6 F (36.4 C), temperature source Oral, resp. rate (!) 30, height _0  (1.575 m), weight 71 kg, SpO2 96 %.  -CBC 4 days ago WBC 23, hemoglobin 9.9, platelet 97 Today: WBC 33.2, hemoglobin 8.6, platelets 67, absolute neutrophils 0.7, lymphocyte absolute 10.3, BUN 35, creatinine 2.11, -Lactic acid 3.4  EKG: Bradycardic, negative for any ST elevation depression, CXR: IMPRESSION: 1. Stable cardiomegaly. 2. Low lung volumes with bandlike atelectasis in the left mid lung, unchanged. No evidence of pneumonia or large pleural effusion.    Assessment & Plan:   Principal Problem:   Sepsis ([place]) Active Problems:   Leukocytosis   PAF (paroxysmal atrial fibrillation) (HCC)   AML (acute myeloblastic leukemia) (HCC)   CVA (cerebral vascular  accident) ([place])   AKI (acute kidney injury) ([place])   Hypertension   Emphysema lung ([place])   CAD S/P percutaneous coronary angioplasty   Malignant neoplasm of prostate ([place])   Acute on chronic systolic CHF (congestive heart failure) (HCC)   Hypotensive episode   Thrombocytopenia (HCC)   Hypokalemia     Assessment and Plan: * Sepsis ([place]) - Meeting sepsis physiology, RR 30, BP 77/ 46 WBC 33.2, 67, creatinine 2.11, -Unknown etiology -UA [place] -Chest x-ray reviewed-clear -Lactic acid 3.4 >> 2.6  -Activated sepsis pathway: Aggressive IV fluid resuscitation with LR --- DC'd now -Broad-spectrum IV antibiotics Vanco and Zosyn initiated >> switching to Rocephin, Flagyl, vancomycin  -Much improved sepsis physiology with exception of tachypnea -stable blood pressure, resolved lactic acidosis, improved leukocytosis -Cultures remain negative anticipating discontinuing IV antibiotics (Rocephin/Flagyl/Vanc) switching to p.o. Levaquin,   Leukocytosis - Acute on chronic, with recent diagnosis of leukemia -Met sepsis criteria on admission with unknown etiology (Hypotensive, lactic acidosis, marked leukocytosis, AKI) -Ulcers remain negative to date  -We will monitor leukocytosis closely  -Discussed with Dr. Lorraine Lax if this is leukemoid reaction and if WBC gets > greater than 50 K then hydroxyurea treatment will be considered -As long as platelets greater than 50 can resume his home medication of Coumadin  -WBC improved to 23.1  hemoglobin 8.7, platelet 58 -Remained stable   AML (acute myeloblastic leukemia) ([place]) -Not official diagnosis yet - Abnormal CBC with differential: Acute leukocytosis, thrombocytopenia -Recent work-up by oncologist Dr. Delton Coombes Including bone biopsy 04/07/2021 - case with Dr. Delton Coombes -he recommended supportive therapy IV fluids ruling out infection,  PRBC transfusion -If WBC > 50 K, consideration of treatment with hydroxyurea  -Dr. Delton Coombes  will be consulted appreciate his follow-up and input  -On admission hemoglobin 8.6 >> 1U PRBC 3/4 >> 8.0 >> 1U PRBC 3/5 >>> 8.8  PAF (paroxysmal atrial fibrillation) (HCC) - we were Holding Coumadin due to thrombocytopenia -We will continue beta-blockers if tolerated -Holding BP meds -Per Dr. Delton Coombes as long as platelets are> 50 K Can resume Coumadin  CVA (cerebral vascular accident) ([place]) -No residual or new focal neurological findings - Currently anemic, thrombocytopenic -Holding Coumadin >> resuming Coumadin -Stable we will monitor closely   AKI (acute kidney injury) ([place]) -Resolved - Likely due to sepsis, dehydration, hypotension -With holding BP meds -Status post IV fluid resuscitation -Avoiding nephrotoxin  -BUN 35 /creatinine 2.11 >> 1.56 >>> 1.27  Hypokalemia - Continue to monitor, replete orally  Thrombocytopenia ([place]) - Likely associated with sepsis versus leukemia with acute reaction -Monitoring for any overt bleeding -According to Dr. Delton Coombes as long as platelets > 50 K We can resume Coumadin   Hypotensive episode - Likely due to sepsis --- resolved - S/P IV fluid resuscitation -Holding home medication of hydralazine, Coreg, Lasix, HCTZ,  Acute on chronic systolic CHF (congestive heart failure) (HCC) - Currently hypotensive -Status post IV fluid resuscitation due to sepsis..  DC'd -Monitoring for fluid overload -We will monitor I's and O's, daily weight -Resuming home dose Lasix now -Last Echo: 03/24/2018 EJ EF 40-45%, mild LVH, A-fib, mild/moderate systolic dysfunction  Malignant neoplasm of prostate (HCC) In remission -- PSA was 4.7. - Status post I-125 radioactive seed implantation on 05/02/2014.  CAD S/P percutaneous coronary angioplasty - CABG 1997, last cardiac cath 2006-with 2 bare-metal stent in place Last cardiovascular stress test 09/09/2013 LVEF 49% -We will holding Coumadin antiplatelet therapy due to thrombocytopenia. -He is not  on any statins  Emphysema lung (HCC) - Continue oxygen supplements, as needed DuoNeb bronchodilator treatment, -Home inhalers including Breo, Singulair,  will be continued  Hypertension - Currently hypotensive likely due to sepsis -BP has stabilized -Holding home BP meds: Which includes lisinopril, HCTZ, Lasix, Coreg -Stable continue to hold above meds     Blood Cultures x 2 >> NGT Urine Culture  >>> NGT  Sputum Culture >> NGT    -------------------------------------------------------------------------------------------------------------------------  DVT prophylaxis:  Place and maintain sequential compression device Start: 03/15/21 0810 TED hose Start: 03/14/21 1432 SCDs Start: 03/14/21 1432   Code Status:   Code Status: Full Code  Family Communication: Discussed with family at bedside Patient is a former daughter-in-law Ms. [name] was updated The above findings and plan of care has been discussed with patient (and family)  in detail,  they expressed understanding and agreement of above. -Advance care planning has been discussed.   Admission status:   Status is: Inpatient Remains inpatient appropriate because: Treating sepsis, hypertension, anemia, leukocytosis, AKI,     Disposition: Pending discharging home in 1-2 days With home health        Procedures:   No admission procedures for hospital encounter.   Antimicrobials:  Anti-infectives (From admission, onward)    Start     Dose/Rate Route Frequency Ordered Stop   03/16/21 1600  vancomycin (VANCOREADY) IVPB  1250 mg/250 mL  Status:  Discontinued       See Hyperspace for full Linked Orders Report.   1,250 mg 166.7 mL/hr over 90 Minutes Intravenous Every 48 hours 03/14/21 1451 03/15/21 1056   03/16/21 1600  vancomycin (VANCOREADY) IVPB 1500 mg/300 mL  Status:  Discontinued       See Hyperspace for full Linked Orders Report.   1,500 mg 150 mL/hr over 120 Minutes Intravenous Every 48 hours 03/15/21 1056  03/15/21 1058   03/16/21 1000  vancomycin (VANCOREADY) IVPB 1500 mg/300 mL        1,500 mg 150 mL/hr over 120 Minutes Intravenous Every 48 hours 03/15/21 1058     03/15/21 1300  cefTRIAXone (ROCEPHIN) 2 g in sodium chloride 0.9 % 100 mL IVPB        2 g 200 mL/hr over 30 Minutes Intravenous Every 24 hours 03/14/21 1451     03/14/21 1600  vancomycin (VANCOREADY) IVPB 1500 mg/300 mL  Status:  Discontinued       See Hyperspace for full Linked Orders Report.   1,500 mg 150 mL/hr over 120 Minutes Intravenous  Once 03/14/21 1451 03/15/21 1058   03/14/21 1500  metroNIDAZOLE (FLAGYL) IVPB 500 mg        500 mg 100 mL/hr over 60 Minutes Intravenous Every 12 hours 03/14/21 1437 03/21/21 1459   03/14/21 1445  aztreonam (AZACTAM) 2 g in sodium chloride 0.9 % 100 mL IVPB  Status:  Discontinued        2 g 200 mL/hr over 30 Minutes Intravenous  Once 03/14/21 1437 03/14/21 1442   03/14/21 1445  vancomycin (VANCOCIN) IVPB 1000 mg/200 mL premix  Status:  Discontinued        1,000 mg 200 mL/hr over 60 Minutes Intravenous  Once 03/14/21 1437 03/14/21 1451   03/14/21 1415  vancomycin (VANCOCIN) IVPB 1000 mg/200 mL premix  Status:  Discontinued        1,000 mg 200 mL/hr over 60 Minutes Intravenous  Once 03/14/21 1401 03/14/21 1623   03/14/21 1415  piperacillin-tazobactam (ZOSYN) IVPB 3.375 g  Status:  Discontinued        3.375 g 100 mL/hr over 30 Minutes Intravenous  Once 03/14/21 1401 03/14/21 1437   03/14/21 1245  cefTRIAXone (ROCEPHIN) 2 g in sodium chloride 0.9 % 100 mL IVPB        2 g 200 mL/hr over 30 Minutes Intravenous  Once 03/14/21 1235 03/14/21 1437   03/14/21 1245  metroNIDAZOLE (FLAGYL) IVPB 500 mg        500 mg 100 mL/hr over 60 Minutes Intravenous  Once 03/14/21 1235 03/14/21 1437        Medication:   carvedilol  12.5 mg Oral BID WC   Chlorhexidine Gluconate Cloth  6 each Topical Daily   fluticasone  2 spray Each Nare Daily   fluticasone furoate-vilanterol  1 puff Inhalation Daily    ipratropium  0.5 mg Nebulization TID   levalbuterol  0.63 mg Nebulization TID   montelukast  10 mg Oral QHS   sodium chloride flush  3 mL Intravenous Q12H   sodium chloride flush  3 mL Intravenous Q12H    sodium chloride, acetaminophen **OR** acetaminophen, bisacodyl, hydrALAZINE, HYDROmorphone (DILAUDID) injection, ipratropium, levalbuterol, nitroGLYCERIN, ondansetron **OR** ondansetron (ZOFRAN) IV, oxyCODONE, senna-docusate, sodium chloride flush, traZODone   Objective:   Vitals:   03/16/21 0600 03/16/21 0700 03/16/21 0736 03/16/21 0741  BP: (!) 129/49 118/62 119/85 119/85  Pulse: 73 74 80 76  Resp: (!)  27 (!) 22    Temp:    97.6 F (36.4 C)  TempSrc:    Oral  SpO2: 96% 98%  100%  Weight:      Height:        Intake/Output Summary (Last 24 hours) at 03/16/2021 0927 Last data filed at 03/16/2021 0905 Gross per 24 hour  Intake 428 ml  Output 850 ml  Net -422 ml   Filed Weights   03/14/21 1201 03/15/21 1616 03/16/21 0443  Weight: 71 kg 70.5 kg 71.2 kg     Examination:      Physical Exam:   General:  Alert, oriented, cooperative, no distress;   HEENT:  Normocephalic, PERRL, otherwise with in Normal limits   Neuro:  CNII-XII intact. , normal motor and sensation, reflexes intact   Lungs:   Clear to auscultation BL, Respirations unlabored, no wheezes / crackles  Cardio:    S1/S2, RRR, No murmure, No Rubs or Gallops   Abdomen:   Soft, non-tender, bowel sounds active all four quadrants,  no guarding or peritoneal signs.  Muscular skeletal:  Limited exam - in bed, able to move all 4 extremities, Normal strength,  2+ pulses,  symmetric, No pitting edema  Skin:  Dry, warm to touch, negative for any Rashes,  Wounds: Please see nursing documentation        ---------------------------------------------------------------------------------------------------------    LABs:  CBC Latest Ref Rng & Units 03/16/2021 03/15/2021 03/15/2021  WBC 4.0 - 10.5 K/uL 23.1(H) 35.7(H) 32.7(H)   Hemoglobin 13.0 - 17.0 g/dL 8.8(L) 9.2(L) 8.0(L)  Hematocrit 39.0 - 52.0 % 26.8(L) 29.1(L) 25.0(L)  Platelets 150 - 400 K/uL 58(L) 67(L) 63(L)   CMP Latest Ref Rng & Units 03/16/2021 03/15/2021 03/14/2021  Glucose 70 - 99 mg/dL 100(H) 110(H) 141(H)  BUN 8 - 23 mg/dL 30(H) 31(H) 35(H)  Creatinine 0.61 - 1.24 mg/dL 1.27(H) 1.56(H) 2.11(H)  Sodium 135 - 145 mmol/L 135 136 134(L)  Potassium 3.5 - 5.1 mmol/L 3.4(L) 3.9 3.5  Chloride 98 - 111 mmol/L 109 109 106  CO2 22 - 32 mmol/L 19(L) 21(L) 22  Calcium 8.9 - 10.3 mg/dL 7.6(L) 7.6(L) 8.1(L)  Total Protein 6.5 - 8.1 g/dL 5.6(L) 5.8(L) 6.6  Total Bilirubin 0.3 - 1.2 mg/dL 1.2 0.8 1.5(H)  Alkaline Phos 38 - 126 U/L 50 47 48  AST 15 - 41 U/L 11(L) 17 18  ALT 0 - 44 U/L _0 Micro Results Recent Results (from the past 240 hour(s))  Resp Panel by RT-PCR (Flu A&B, Covid) Nasopharyngeal Swab     Status: None   Collection Time: 03/14/21  8:02 AM   Specimen: Nasopharyngeal Swab; Nasopharyngeal(NP) swabs in vial transport medium  Result Value Ref Range Status   SARS Coronavirus 2 by RT PCR NEGATIVE NEGATIVE Final    Comment: (NOTE) SARS-CoV-2 target nucleic acids are NOT DETECTED.  The SARS-CoV-2 RNA is generally detectable in upper respiratory specimens during the acute phase of infection. The lowest concentration of SARS-CoV-2 viral copies this assay can detect is 138 copies/mL. A negative result does not preclude SARS-Cov-2 infection and should not be used as the sole basis for treatment or other patient management decisions. A negative result may occur with  improper specimen collection/handling, submission of specimen other than nasopharyngeal swab, presence of viral mutation(s) within the areas targeted by this assay, and inadequate number of viral copies(<138 copies/mL). A negative result must be combined with clinical observations, patient history, and epidemiological information. The expected  result is Negative.  Fact  Sheet for Patients:  EntrepreneurPulse.com.au  Fact Sheet for Healthcare Providers:  IncredibleEmployment.be  This test is no t yet approved or cleared by the [place] FDA and  has been authorized for detection and/or diagnosis of SARS-CoV-2 by FDA under an Emergency Use Authorization (EUA). This EUA will remain  in effect (meaning this test can be used) for the duration of the COVID-19 declaration under Section 564(b)(1) of the Act, 21 U.S.C.section 360bbb-3(b)(1), unless the authorization is terminated  or revoked sooner.       Influenza A by PCR NEGATIVE NEGATIVE Final   Influenza B by PCR NEGATIVE NEGATIVE Final    Comment: (NOTE) The Xpert Xpress SARS-CoV-2/FLU/RSV plus assay is intended as an aid in the diagnosis of influenza from Nasopharyngeal swab specimens and should not be used as a sole basis for treatment. Nasal washings and aspirates are unacceptable for Xpert Xpress SARS-CoV-2/FLU/RSV testing.  Fact Sheet for Patients: EntrepreneurPulse.com.au  Fact Sheet for Healthcare Providers: IncredibleEmployment.be  This test is not yet approved or cleared by the [place] FDA and has been authorized for detection and/or diagnosis of SARS-CoV-2 by FDA under an Emergency Use Authorization (EUA). This EUA will remain in effect (meaning this test can be used) for the duration of the COVID-19 declaration under Section 564(b)(1) of the Act, 21 U.S.C. section 360bbb-3(b)(1), unless the authorization is terminated or revoked.  Performed at [facility], [address] [place], [place] [address]   Blood Culture (routine x 2)     Status: None (Preliminary result)   Collection Time: 03/14/21 12:35 PM   Specimen: Left Antecubital; Blood  Result Value Ref Range Status   Specimen Description   Final    LEFT ANTECUBITAL BOTTLES DRAWN AEROBIC AND ANAEROBIC   Special Requests   Final    Blood Culture  adequate volume Performed at [facility], [address], [place], [place] [address]    Culture PENDING  Incomplete   Report Status PENDING  Incomplete  Blood Culture (routine x 2)     Status: None (Preliminary result)   Collection Time: 03/14/21 12:55 PM   Specimen: BLOOD RIGHT HAND  Result Value Ref Range Status   Specimen Description   Final    BLOOD RIGHT HAND BOTTLES DRAWN AEROBIC AND ANAEROBIC   Special Requests   Final    Blood Culture adequate volume Performed at [facility], [address] [place], [place] [address]    Culture PENDING  Incomplete   Report Status PENDING  Incomplete  Expectorated Sputum Assessment w Gram Stain, Rflx to Resp Cult     Status: None   Collection Time: 03/15/21  8:08 AM   Specimen: Sputum  Result Value Ref Range Status   Specimen Description SPUTUM  Final   Special Requests NONE  Final   Sputum evaluation   Final    THIS SPECIMEN IS ACCEPTABLE FOR SPUTUM CULTURE Performed at [facility], [address], [place], [place] [address]    Report Status 03/15/2021 FINAL  Final  Culture, Respiratory w Gram Stain     Status: None (Preliminary result)   Collection Time: 03/15/21  8:08 AM   Specimen: SPU  Result Value Ref Range Status   Specimen Description   Final    SPUTUM Performed at [facility], [address], [place], [place] [address]    Special Requests   Final    NONE Reflexed from [id number] Performed at [facility], [address], [place], [place] [address]  Gram Stain   Final    RARE SQUAMOUS EPITHELIAL CELLS PRESENT FEW GRAM POSITIVE COCCI IN CLUSTERS Performed at [facility], [place] [address] [place], [place] [address]    Culture PENDING  Incomplete   Report Status PENDING  Incomplete  MRSA Next Gen by PCR, Nasal     Status: None   Collection Time: 03/15/21  4:25 PM   Specimen: Nasal Mucosa; Nasal Swab  Result Value Ref Range Status   MRSA by PCR Next Gen NOT DETECTED NOT DETECTED Final    Comment: (NOTE) The  GeneXpert MRSA Assay (FDA approved for NASAL specimens only), is one component of a comprehensive MRSA colonization surveillance program. It is not intended to diagnose MRSA infection nor to guide or monitor treatment for MRSA infections. Test performance is not FDA approved in patients less than 68 years old. Performed at [facility], [address], [place], [place] [address]     Radiology Reports No results found.  SIGNED: Deatra James, MD, FHM. [facility],  Pager (please use [email or web address] to page/text) Please use Epic Secure Chat for non-urgent communication (7AM-7PM)  If 7PM-7AM, please contact night-coverage [email or web address], 03/16/2021, 9:28 AM

## 2021-03-16 NOTE — Assessment & Plan Note (Addendum)
-   was repleted orally ?

## 2021-03-16 NOTE — TOC Initial Note (Signed)
Transition of Care (TOC) - Initial/Assessment Note  ? ? ?Patient Details  ?Name: Vincent Carter ?MRN: [id number] ?Date of Birth: 1945/06/26 ? ?Transition of Care (TOC) CM/SW Contact:    ?Salome Arnt, LCSW ?Phone Number: ?03/16/2021, 1:06 PM ? ?Clinical Narrative:  Pt admitted with sepsis. TOC completed assessment due to high risk readmission score and consult for home health/DME. Pt reports he lives alone, but his daughter-in-law lives right beside him. He is independent with ADLs and drives himself to most appointments or his daughter-in-law will take him. No home health prior to admission. PT evaluated pt and no needs. Pt indicates he does not need any DME. TOC will continue to follow.                ? ? ?Expected Discharge Plan: Home/Self Care ?Barriers to Discharge: Continued Medical Work up ? ? ?Patient Goals and CMS Choice ?Patient states their goals for this hospitalization and ongoing recovery are:: return home ?  ?Choice offered to / list presented to : Patient ? ?Expected Discharge Plan and Services ?Expected Discharge Plan: Home/Self Care ?In-house Referral: Clinical Social Work ?  ?  ?Living arrangements for the past 2 months: [place] ?                ?  ?  ?  ?  ?  ?  ?  ?  ?  ?  ? ?Prior Living Arrangements/Services ?Living arrangements for the past 2 months: [place] ?Lives with:: Self ?Patient language and need for interpreter reviewed:: Yes ?Do you feel safe going back to the place where you live?: Yes      ?Need for Family Participation in Patient Care: No (Comment) ?  ?  ?Criminal Activity/Legal Involvement Pertinent to Current Situation/Hospitalization: No - Comment as needed ? ?Activities of Daily Living ?Home Assistive Devices/Equipment: None ?ADL Screening (condition at time of admission) ?Patient's cognitive ability adequate to safely complete daily activities?: Yes ?Is the patient deaf or have difficulty hearing?: No ?Does the patient have difficulty  seeing, even when wearing glasses/contacts?: No ?Does the patient have difficulty concentrating, remembering, or making decisions?: No ?Patient able to express need for assistance with ADLs?: Yes ?Does the patient have difficulty dressing or bathing?: No ?Independently performs ADLs?: Yes (appropriate for developmental age) ?Does the patient have difficulty walking or climbing stairs?: No ?Weakness of Legs: None ?Weakness of Arms/Hands: None ? ?Permission Sought/Granted ?  ?  ?   ?   ?   ?   ? ?Emotional Assessment ?  ?  ?Affect (typically observed): Appropriate ?Orientation: : Oriented to Self, Oriented to Place, Oriented to Situation, Oriented to  Time ?Alcohol / Substance Use: Not Applicable ?Psych Involvement: No (comment) ? ?Admission diagnosis:  Bibasilar crackles [R09.89] ?Acute leukemia not having achieved remission ([place]) [C95.00] ?Acute kidney injury ([place]) [N17.9] ?Sepsis ([place]) [A41.9] ?Hypotension, unspecified hypotension type [I95.9] ?Patient Active Problem List  ? Diagnosis Date Noted  ? Hypokalemia 03/16/2021  ? Sepsis ([place]) 03/14/2021  ? Hypotensive episode 03/14/2021  ? Leukocytosis 03/14/2021  ?  Class: Acute  ? Thrombocytopenia ([place]) 03/14/2021  ? AML (acute myeloblastic leukemia) ([place]) 03/14/2021  ?  Class: Acute  ? AKI (acute kidney injury) ([place]) 03/14/2021  ?  Class: Acute  ? PAF (paroxysmal atrial fibrillation) ([place]) 07/15/2020  ? CVA (cerebral vascular accident) ([place]) 07/15/2020  ? Acute on chronic systolic CHF (congestive heart failure) ([place]) 03/24/2018  ? COPD with acute exacerbation ([place]) 03/24/2018  ? Malignant  neoplasm of prostate ([place]) 03/05/2014  ? CAD S/P percutaneous coronary angioplasty   ? Tobacco abuse, in remission   ? Low TSH level   ? Peripheral vascular disease ([place])   ? Hypertension 12/09/2008  ? Emphysema lung ([place]) 12/09/2008  ? ?PCP:  [name], MD ?Pharmacy:   ?[facility] [address] ?Phone:  [phone] Fax: [phone] ? ? ? ? ?Social Determinants of Health (SDOH) Interventions ?  ? ?Readmission Risk Interventions ?Readmission Risk Prevention Plan 03/16/2021  ?Transportation Screening Complete  ?Medication Review Press photographer) Complete  ?[place] or Home Care Consult Complete  ?SW Recovery Care/Counseling Consult Complete  ?Palliative Care Screening Not Applicable  ?[place] Not Applicable  ?Some recent data might be hidden  ? ? ? ?

## 2021-03-16 NOTE — Evaluation (Signed)
Physical Therapy Evaluation ?Patient Details ?Name: Vincent Carter ?MRN: [id number] ?DOB: 01/21/1945 ?Today's Date: 03/16/2021 ? ?History of Present Illness ? 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 presenting today with generalized weakness, chest pain.   According to the family symptoms started last night, slept with heating pad on his chest, he was asymptomatic this morning until breakfast time  When he was  found to be altered, confused while eating, lightheaded, dizziness, and hypotensive. ?  ?Clinical Impression ? Patient functioning near baseline for functional mobility and gait demonstrating good return for ambulation in room and hallways without use of an AD.  Patient on room air with SpO2 at 93-95% during ambulation and required 1 standing rest break due to mild SOB before returning to room - RN notified.  Patient encouraged to ambulate in room ad lib and with nursing staff in hallways.  Plan:  Patient discharged from physical therapy to care of nursing for ambulation daily as tolerated for length of stay.  ?   ?   ? ?Recommendations for follow up therapy are one component of a multi-disciplinary discharge planning process, led by the attending physician.  Recommendations may be updated based on patient status, additional functional criteria and insurance authorization. ? ?Follow Up Recommendations No PT follow up ? ?  ?Assistance Recommended at Discharge PRN  ?Patient can return home with the following ? Help with stairs or ramp for entrance;Assistance with cooking/housework ? ?  ?Equipment Recommendations None recommended by PT  ?Recommendations for Other Services ?    ?  ?Functional Status Assessment Patient has had a recent decline in their  functional status and/or demonstrates limited ability to make significant improvements in function in a reasonable and predictable amount of time  ? ?  ?Precautions / Restrictions Precautions ?Precautions: None ?Restrictions ?Weight Bearing Restrictions: No  ? ?  ? ?Mobility ? Bed Mobility ?Overal bed mobility: Modified Independent ?  ?  ?  ?  ?  ?  ?  ?  ? ?Transfers ?Overall transfer level: Modified independent ?  ?  ?  ?  ?  ?  ?  ?  ?  ?  ? ?Ambulation/Gait ?Ambulation/Gait assistance: Modified independent (Device/Increase time), Supervision ?Gait Distance (Feet): 100 Feet ?Assistive device: None ?Gait Pattern/deviations: Decreased step length - right, Decreased step length - left, Decreased stride length ?Gait velocity: decreased ?  ?  ?General Gait Details: slightly labored cadence with good return for ambulation in room and hallways without loss of balance, on room air with SpO2 at 93-95%, required 1 standing rest break due to SOB leaning on counter top before returning to room ? ?Stairs ?  ?  ?  ?  ?  ? ?Wheelchair Mobility ?  ? ?Modified Rankin (Stroke Patients Only) ?  ? ?  ? ?Balance Overall balance assessment: Mild deficits observed, not formally tested ?  ?  ?  ?  ?  ?  ?  ?  ?  ?  ?  ?  ?  ?  ?  ?  ?  ?  ?   ? ? ? ?Pertinent Vitals/Pain Pain Assessment ?Pain Assessment: No/denies pain  ? ? ?Home Living Family/patient expects to be discharged to:: Private residence ?Living Arrangements: Alone ?Available Help at Discharge: Family;Friend(s);Available 24  hours/day ?Type of Home: Mobile home ?Home Access: Stairs to enter ?Entrance Stairs-Rails: Right;Left;Can reach both ?Entrance Stairs-Number of Steps: 4-5 ?  ?Home Layout: One level ?Home Equipment: Conservation officer, nature (2 wheels) ?   ?  ?Prior Function Prior Level of Function : Independent/Modified Independent ?  ?  ?  ?  ?  ?  ?Mobility Comments: community ambulator, drives ?ADLs Comments: Independent ?  ? ? ?Hand Dominance  ? Dominant Hand: Left ? ?   ?Extremity/Trunk Assessment  ? Upper Extremity Assessment ?Upper Extremity Assessment: Overall WFL for tasks assessed ?  ? ?Lower Extremity Assessment ?Lower Extremity Assessment: Overall WFL for tasks assessed ?  ? ?Cervical / Trunk Assessment ?Cervical / Trunk Assessment: Normal  ?Communication  ? Communication: HOH  ?Cognition Arousal/Alertness: Awake/alert ?Behavior During Therapy: [facility] for tasks assessed/performed ?Overall Cognitive Status: Within Functional Limits for tasks assessed ?  ?  ?  ?  ?  ?  ?  ?  ?  ?  ?  ?  ?  ?  ?  ?  ?  ?  ?  ? ?  ?General Comments   ? ?  ?Exercises    ? ?Assessment/Plan  ?  ?PT Assessment Patient does not need any further PT services  ?PT Problem List   ? ?   ?  ?PT Treatment Interventions     ? ?PT Goals (Current goals can be found in the Care Plan section)  ?Acute Rehab PT Goals ?Patient Stated Goal: return home ?PT Goal Formulation: With patient ?Time For Goal Achievement: 03/16/21 ?Potential to Achieve Goals: Good ? ?  ?Frequency   ?  ? ? ?Co-evaluation   ?  ?  ?  ?  ? ? ?  ?AM-PAC PT "6 Clicks" Mobility  ?Outcome Measure Help needed turning from your back to your side while in a flat bed without using bedrails?: None ?Help needed moving from lying on your back to sitting on the side of a flat bed without using bedrails?: None ?Help needed moving to and from a bed to a chair (including a wheelchair)?: None ?Help needed standing up from a chair using your arms (e.g., wheelchair or bedside chair)?: None ?Help needed to walk in hospital room?: None ?Help needed climbing 3-5 steps with a railing? : A Little ?6 Click Score: 23 ? ?  ?End of Session   ?Activity Tolerance: Patient limited by fatigue ?Patient left: in chair;with call bell/phone within reach ?Nurse Communication: Mobility status ?PT Visit Diagnosis: Unsteadiness on feet (R26.81);Other abnormalities of gait and mobility (R26.89);Muscle weakness (generalized) (M62.81) ?  ? ?Time: 4132-4401 ?PT Time Calculation (min)  (ACUTE ONLY): 15 min ? ? ?Charges:   PT Evaluation ?$PT Eval Low Complexity: 1 Low ?PT Treatments ?$Therapeutic Activity: 8-22 mins ?  ?   ? ? ?2:57 PM, 03/16/21 ?Lonell Grandchild, MPT ?Physical Therapist with [facility] ?[facility] ?[phone] office ?[phone] mobile phone ? ? ?

## 2021-03-17 ENCOUNTER — Encounter (HOSPITAL_COMMUNITY): Payer: Self-pay | Admitting: Hematology

## 2021-03-17 DIAGNOSIS — C9500 Acute leukemia of unspecified cell type not having achieved remission: Secondary | ICD-10-CM

## 2021-03-17 LAB — COMPREHENSIVE METABOLIC PANEL
ALT: 15 U/L (ref 0–44)
AST: 10 U/L — ABNORMAL LOW (ref 15–41)
Albumin: 2.5 g/dL — ABNORMAL LOW (ref 3.5–5.0)
Alkaline Phosphatase: 52 U/L (ref 38–126)
Anion gap: 7 (ref 5–15)
BUN: 31 mg/dL — ABNORMAL HIGH (ref 8–23)
CO2: 20 mmol/L — ABNORMAL LOW (ref 22–32)
Calcium: 7.8 mg/dL — ABNORMAL LOW (ref 8.9–10.3)
Chloride: 110 mmol/L (ref 98–111)
Creatinine, Ser: 1.24 mg/dL (ref 0.61–1.24)
GFR, Estimated: 60 mL/min — ABNORMAL LOW (ref >60)
Glucose, Bld: 101 mg/dL — ABNORMAL HIGH (ref 70–99)
Potassium: 3.8 mmol/L (ref 3.5–5.1)
Sodium: 137 mmol/L (ref 135–145)
Total Bilirubin: 1.1 mg/dL (ref 0.3–1.2)
Total Protein: 5.8 g/dL — ABNORMAL LOW (ref 6.5–8.1)

## 2021-03-17 LAB — CBC WITH DIFFERENTIAL/PLATELET
Basophils Absolute: 0 10*3/uL (ref 0.0–0.1)
Basophils Relative: 0 %
Eosinophils Absolute: 0 10*3/uL (ref 0.0–0.5)
Eosinophils Relative: 0 %
HCT: 27.7 % — ABNORMAL LOW (ref 39.0–52.0)
Hemoglobin: 8.6 g/dL — ABNORMAL LOW (ref 13.0–17.0)
Lymphocytes Relative: 40 %
Lymphs Abs: 6.5 10*3/uL — ABNORMAL HIGH (ref 0.7–4.0)
MCH: 32.3 pg (ref 26.0–34.0)
MCHC: 31 g/dL (ref 30.0–36.0)
MCV: 104.1 fL — ABNORMAL HIGH (ref 80.0–100.0)
Monocytes Absolute: 8.9 10*3/uL — ABNORMAL HIGH (ref 0.1–1.0)
Monocytes Relative: 55 %
Neutro Abs: 0.8 10*3/uL — ABNORMAL LOW (ref 1.7–7.7)
Neutrophils Relative %: 5 %
Platelets: 66 10*3/uL — ABNORMAL LOW (ref 150–400)
RBC: 2.66 MIL/uL — ABNORMAL LOW (ref 4.22–5.81)
RDW: 21.5 % — ABNORMAL HIGH (ref 11.5–15.5)
WBC: 16.2 10*3/uL — ABNORMAL HIGH (ref 4.0–10.5)
nRBC: 0 % (ref 0.0–0.2)

## 2021-03-17 LAB — PROTIME-INR
INR: 2.1 — ABNORMAL HIGH (ref 0.8–1.2)
Prothrombin Time: 23.9 seconds — ABNORMAL HIGH (ref 11.4–15.2)

## 2021-03-17 LAB — GLUCOSE, CAPILLARY: Glucose-Capillary: 93 mg/dL (ref 70–99)

## 2021-03-17 MED ORDER — LEVOFLOXACIN 500 MG PO TABS: 500.0000 mg | ORAL_TABLET | Freq: Every day | ORAL | 0 refills | Status: AC

## 2021-03-17 MED ORDER — IPRATROPIUM BROMIDE 0.02 % IN SOLN: 0.5000 mg | Freq: Two times a day (BID) | RESPIRATORY_TRACT | Status: DC

## 2021-03-17 MED ORDER — LEVALBUTEROL HCL 0.63 MG/3ML IN NEBU: 0.6300 mg | INHALATION_SOLUTION | Freq: Two times a day (BID) | RESPIRATORY_TRACT | Status: DC

## 2021-03-17 NOTE — Discharge Summary (Signed)
Physician Discharge Summary   Patient: Vincent Carter MRN: [id number] DOB: [birth year]  Admit date:     03/14/2021  Discharge date: 03/17/21  Discharge Physician: Deatra James   PCP: [name], MD   Recommendations at discharge:   Please follow-up with oncologist Dr. Jerrye Noble at [facility]. This Friday 03/20/21  at 1 PM in his clinic. Follow-up with Dr. 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 [facility] Course: 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 presenting today with generalized weakness, chest pain.  According to the family symptoms started last night, slept with heating pad on his chest, he was asymptomatic this morning until breakfast time When he was  found to be altered, confused while eating, lightheaded, dizziness, and hypotensive.  ED work-up: Blood pressure (!) 85/46, pulse 66, temperature 97.6 F (36.4 C), temperature source Oral, resp. rate (!) 30, height _0  (1.575 m), weight 71 kg, SpO2 96 %.  -CBC 4 days ago WBC 23, hemoglobin 9.9, platelet  97 Today: WBC 33.2, hemoglobin 8.6, platelets 67, absolute neutrophils 0.7, lymphocyte absolute 10.3, BUN 35, creatinine 2.11, -Lactic acid 3.4  EKG: Bradycardic, negative for any ST elevation depression, CXR: IMPRESSION: 1. Stable cardiomegaly. 2. Low lung volumes with bandlike atelectasis in the left mid lung, unchanged. No evidence of pneumonia or large pleural effusion.  Assessment and Plan: * Sepsis ([place]) - Meeting sepsis physiology, RR 30, BP 77/ 46 WBC 33.2, 67, creatinine 2.11, -Unknown etiology -UA [place] -Chest x-ray reviewed-clear -Lactic acid 3.4 >> 2.6  -Activated sepsis pathway: Aggressive IV fluid resuscitation with LR --- DC'd now -Broad-spectrum IV antibiotics Vanco and Zosyn initiated >> switching to Rocephin, Flagyl, vancomycin  -Much improved sepsis physiology with exception of tachypnea -stable blood pressure, resolved lactic acidosis, improved leukocytosis -Cultures remain negative anticipating discontinuing IV antibiotics (Rocephin/Flagyl/Vanc) switching to p.o. Levaquin,   Leukocytosis - Acute on chronic, with recent diagnosis of leukemia -Met sepsis criteria on admission with unknown etiology (Hypotensive, lactic acidosis, marked leukocytosis, AKI) -Ulcers remain negative to date  -We will monitor leukocytosis closely  -Discussed with Dr. 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 Delton Coombes Including bone biopsy 04/07/2021 - case with Dr. Delton Coombes -he recommended supportive therapy IV fluids ruling out infection,  PRBC  transfusion -If WBC > 50 K, consideration of treatment with hydroxyurea  -Dr. Delton Coombes will be consulted appreciate his follow-up and input  -On admission hemoglobin 8.6 >> 1U PRBC 3/4 >> 8.0 >> 1U PRBC 3/5 >>> 8.8  PAF (paroxysmal atrial fibrillation) (HCC) - we were Holding Coumadin due to thrombocytopenia -We will continue beta-blockers if tolerated -Holding BP meds -Per Dr. Delton Coombes as long as platelets are> 50 K Can resume Coumadin  CVA (cerebral vascular accident) ([place]) -No residual or new focal neurological findings - Currently anemic, thrombocytopenic -Holding Coumadin >> resuming Coumadin -Stable we will monitor closely   AKI (acute kidney injury) ([place]) -Resolved - Likely due to sepsis, dehydration, hypotension -With holding BP meds -Status post IV fluid resuscitation -Avoiding nephrotoxin  -BUN 35 /creatinine 2.11 >> 1.56 >>> 1.27  Hypokalemia - Continue to monitor, replete orally  Thrombocytopenia ([place]) - Likely associated with sepsis versus leukemia with acute reaction -Monitoring for any overt bleeding -According to Dr. Delton Coombes as long as platelets > 50 K We can resume Coumadin   Hypotensive episode - Likely due to sepsis --- resolved - S/P IV fluid resuscitation -Holding home medication of hydralazine, Coreg, Lasix, HCTZ,  Acute on chronic systolic CHF (congestive heart failure) (HCC) - Currently hypotensive -Status post IV fluid resuscitation due to sepsis..  DC'd -Monitoring for fluid overload -We will monitor I's and O's, daily weight -Resuming home dose Lasix now -Last Echo: 03/24/2018 EJ EF 40-45%, mild LVH, A-fib, mild/moderate systolic dysfunction  Malignant neoplasm of prostate (HCC) In remission -- PSA was 4.7. - Status post I-125 radioactive seed implantation on 05/02/2014.  CAD S/P percutaneous coronary angioplasty - CABG 1997, last cardiac cath 2006-with 2 bare-metal stent in place Last cardiovascular stress test 09/09/2013  LVEF 49% -We will holding Coumadin antiplatelet therapy due to thrombocytopenia. -He is not on any statins  Emphysema lung (HCC) - Continue oxygen supplements, as needed DuoNeb bronchodilator treatment, -Home inhalers including Breo, Singulair,  will be continued  Hypertension - Currently hypotensive likely due to sepsis -BP has stabilized -Holding home BP meds: Which includes lisinopril, HCTZ, Lasix, Coreg -Stable continue to hold above meds    Consultants: Oncologist Dr. Delton Coombes Procedures performed: None Disposition: Home Diet recommendation:  Discharge Diet Orders (From admission, onward)     Start     Ordered   03/17/21 0000  Diet - low sodium heart healthy        03/17/21 1030           Regular diet DISCHARGE MEDICATION: Allergies as of 03/17/2021       Reactions   Augmentin [amoxicillin-pot Clavulanate] Diarrhea, Nausea And Vomiting   Codeine Nausea Only   Pill form of codeine causes nausea   Statins Other (See Comments)   Myalgia         Medication List     STOP taking these medications    clindamycin 300 MG capsule Commonly known as: CLEOCIN   hydrochlorothiazide 25 MG tablet Commonly known as: HYDRODIURIL   lisinopril 2.5 MG tablet Commonly known as: ZESTRIL   predniSONE 10 MG tablet Commonly known as: DELTASONE       TAKE these medications    carvedilol 12.5 MG tablet Commonly known as: COREG Take 1 tablet (12.5 mg total) by mouth 2 (two) times daily with a meal. What changed: when to take this   dextromethorphan-guaiFENesin 30-600 MG 12hr tablet Commonly known as: MUCINEX DM Take 1 tablet by mouth 2 (two) times daily. What changed:  when  to take this reasons to take this   Fish Oil 300 MG Caps Take 2 capsules by mouth 2 (two) times daily.   fluticasone 50 MCG/ACT nasal spray Commonly known as: FLONASE Place 2 sprays into both nostrils daily.   furosemide 20 MG tablet Commonly known as: Lasix Take 1 tablet (20 mg  total) by mouth daily.   ipratropium-albuterol 0.5-2.5 (3) MG/3ML Soln Commonly known as: DUONEB Take 3 mLs by nebulization every 6 (six) hours as needed.   levofloxacin 500 MG tablet Commonly known as: LEVAQUIN Take 1 tablet (500 mg total) by mouth daily for 4 days. Start taking on: March 18, 2021   montelukast 10 MG tablet Commonly known as: SINGULAIR TAKE 1 TABLET BY MOUTH AT BEDTIME   Nebulizer Misc Please provide 1 nebulizer machine and all supplies. Diagnosis: J44.1, Office number: [phone]   nitroGLYCERIN 0.4 MG SL tablet Commonly known as: NITROSTAT PLACE 1 TABLET UNDER TONGUE EVERY 5 MINUTES AS NEEDED FOR CHEST PAIN.   potassium chloride SA 20 MEQ tablet Commonly known as: KLOR-CON M Take 1 tablet (20 mEq total) by mouth 2 (two) times daily for 3 days.   Symbicort 80-4.5 MCG/ACT inhaler Generic drug: budesonide-formoterol SMARTSIG:2 Puff(s) Via Inhaler Morning-Evening   warfarin 2.5 MG tablet Commonly known as: COUMADIN Take as directed. If you are unsure how to take this medication, talk to your nurse or doctor. Original instructions: Take 1.5 tablets (3.75 mg total) every Monday, Wednesday, Friday, and Saturday, and 1 tablet (2.5 mg) on other days        Discharge Exam: Filed Weights   03/16/21 0443 03/16/21 1754 03/17/21 0300  Weight: 71.2 kg 71.2 kg 71.5 kg      Physical Exam:   General:  AAO x 3,  cooperative, no distress;   HEENT:  Normocephalic, PERRL, otherwise with in Normal limits   Neuro:  CNII-XII intact. , normal motor and sensation, reflexes intact   Lungs:   Clear to auscultation BL, Respirations unlabored,  No wheezes / crackles  Cardio:    S1/S2, RRR, No murmure, No Rubs or Gallops   Abdomen:  Soft, non-tender, bowel sounds active all four quadrants, no guarding or peritoneal signs.  Muscular  skeletal:  Limited exam -global generalized weaknesses - in bed, able to move all 4 extremities,   2+ pulses,  symmetric, No pitting  edema  Skin:  Dry, warm to touch, negative for any Rashes,  Wounds: Please see nursing documentation          Condition at discharge: stable  The results of significant diagnostics from this hospitalization (including imaging, microbiology, ancillary and laboratory) are listed below for reference.   Imaging Studies: CT BIOPSY  Result Date: 2021/03/31 INDICATION: Anemia. EXAM: CT GUIDED BONE MARROW ASPIRATION AND CORE BIOPSY MEDICATIONS: None. ANESTHESIA/SEDATION: Moderate (conscious) sedation was employed during this procedure. A total of 2 milligrams versed and 100 micrograms fentanyl were administered intravenously. The patient's level of consciousness and vital signs were monitored continuously by radiology nursing throughout the procedure under my direct supervision. Total monitored sedation time: 14 minutes FLUOROSCOPY TIME:  CT dose in mGy was provided COMPLICATIONS: None immediate. Estimated blood loss: <5 mL PROCEDURE: Informed written consent was obtained from the the patient and/or patient's representative after a thorough discussion of the procedural risks, benefits and alternatives. All questions were addressed. Maximal Sterile Barrier Technique was utilized including caps, mask, sterile gowns, sterile gloves, sterile drape, hand hygiene and skin antiseptic. A timeout was performed prior to the initiation  of the procedure. The patient was positioned prone and non-contrast localization CT was performed of the pelvis to demonstrate the iliac marrow spaces. Maximal barrier sterile technique utilized including caps, mask, sterile gowns, sterile gloves, large sterile drape, hand hygiene, and chlorhexidine prep. Under sterile conditions and local anesthesia, an 11 gauge coaxial bone biopsy needle was advanced into the RIGHT iliac marrow space. Needle position was confirmed with CT imaging. Initially, bone marrow aspiration was performed. Next, the 11 gauge outer cannula was utilized to obtain a  1 iliac bone marrow core biopsy. Needle was removed. Hemostasis was obtained with compression. The patient tolerated the procedure well. Samples were prepared with the cytotechnologist. IMPRESSION: Successful CT-guided bone marrow aspiration and biopsy, as above. Michaelle Birks, MD [facility] Electronically Signed   By: Michaelle Birks M.D.   On: 03/10/2021 21:33   DG CHEST PORT 1 VIEW  Result Date: 03/15/2021 CLINICAL DATA:  Being treated for sepsis, presenting with bibasilar crackles. EXAM: PORTABLE CHEST 1 VIEW COMPARISON:  March 14, 2021 FINDINGS: Multiple sternal wires and vascular clips are seen. Stable, mild to moderate severity scarring and/or atelectasis is seen within the bilateral lung bases and mid left lung. There is no evidence of a pleural effusion or pneumothorax. The cardiac silhouette is mildly enlarged and unchanged in size. The visualized skeletal structures are unremarkable. IMPRESSION: Stable cardiomegaly with stable bibasilar and mid left lung scarring and/or atelectasis. Electronically Signed   By: Virgina Norfolk M.D.   On: 03/15/2021 04:09   DG Chest Port 1 View  Result Date: 03/14/2021 CLINICAL DATA:  Questionable sepsis.  Chest pain. EXAM: PORTABLE CHEST 1 VIEW COMPARISON:  Chest radiograph dated February 28, 2021. FINDINGS: The heart is enlarged. Atherosclerotic calcification of aortic arch. Evidence of prior coronary artery bypass grafting. Low lung volumes with bibasilar atelectasis. Bandlike atelectasis in the left mid lung is unchanged. IMPRESSION: 1. Stable cardiomegaly. 2. Low lung volumes with bandlike atelectasis in the left mid lung, unchanged. No evidence of pneumonia or large pleural effusion. Electronically Signed   By: Keane Police D.O.   On: 03/14/2021 13:19   DG Chest Port 1 View  Result Date: 02/28/2021 CLINICAL DATA:  Chest pain and shortness of breath. EXAM: PORTABLE CHEST 1 VIEW COMPARISON:  February 25, 2021 FINDINGS: Multiple sternal wires are noted. Stable mild to moderate severity linear scarring and/or atelectasis is seen within the bilateral lung bases. There is no evidence of a pleural effusion or pneumothorax. The heart size and mediastinal contours are within normal limits. Degenerative changes seen throughout the thoracic spine. IMPRESSION: Stable mild to moderate severity bibasilar linear scarring and/or atelectasis. Electronically Signed   By: Virgina Norfolk M.D.   On: 02/28/2021 22:39   DG Chest Port 1 View  Result Date: 02/25/2021 CLINICAL DATA:  Shortness of breath and cough since December. EXAM: PORTABLE CHEST 1 VIEW COMPARISON:  07/16/2020 FINDINGS: Previous median sternotomy and CABG procedure. Stable cardiomediastinal contours. Unchanged appearance of left midlung scarring. Mild subsegmental atelectasis and volume loss within the right lung base is increased from previous exam. No signs of pleural effusion or interstitial edema. IMPRESSION: 1. Increase in subsegmental atelectasis and volume loss within the right lung base. 2. Stable left midlung scarring. Electronically Signed   By: Kerby Moors M.D.   On: 02/25/2021 09:38   CT BONE MARROW BIOPSY & ASPIRATION  Result Date: 03/10/2021 INDICATION: Anemia. EXAM: CT GUIDED BONE MARROW ASPIRATION AND CORE BIOPSY MEDICATIONS: None. ANESTHESIA/SEDATION: Moderate (conscious)  sedation was employed during this procedure. A total of 2 milligrams versed and 100 micrograms fentanyl were administered intravenously. The patient's level of consciousness and vital signs were monitored continuously by radiology nursing throughout the procedure under my direct supervision. Total monitored sedation time: 14 minutes FLUOROSCOPY TIME:  CT dose in mGy was provided COMPLICATIONS: None immediate. Estimated blood loss: <5 mL PROCEDURE: Informed written consent was obtained from the the patient and/or patient's representative after a thorough discussion of the  procedural risks, benefits and alternatives. All questions were addressed. Maximal Sterile Barrier Technique was utilized including caps, mask, sterile gowns, sterile gloves, sterile drape, hand hygiene and skin antiseptic. A timeout was performed prior to the initiation of the procedure. The patient was positioned prone and non-contrast localization CT was performed of the pelvis to demonstrate the iliac marrow spaces. Maximal barrier sterile technique utilized including caps, mask, sterile gowns, sterile gloves, large sterile drape, hand hygiene, and chlorhexidine prep. Under sterile conditions and local anesthesia, an 11 gauge coaxial bone biopsy needle was advanced into the RIGHT iliac marrow space. Needle position was confirmed with CT imaging. Initially, bone marrow aspiration was performed. Next, the 11 gauge outer cannula was utilized to obtain a 1 iliac bone marrow core biopsy. Needle was removed. Hemostasis was obtained with compression. The patient tolerated the procedure well. Samples were prepared with the cytotechnologist. IMPRESSION: Successful CT-guided bone marrow aspiration and biopsy, as above. Michaelle Birks, MD [facility] Radiology Electronically Signed   By: Michaelle Birks M.D.   On: 03/10/2021 21:33    Microbiology: Results for orders placed or performed during the hospital encounter of 03/14/21  Resp Panel by RT-PCR (Flu A&B, Covid) Nasopharyngeal Swab     Status: None   Collection Time: 03/14/21  8:02 AM   Specimen: Nasopharyngeal Swab; Nasopharyngeal(NP) swabs in vial transport medium  Result Value Ref Range Status   SARS Coronavirus 2 by RT PCR NEGATIVE NEGATIVE Final    Comment: (NOTE) SARS-CoV-2 target nucleic acids are NOT DETECTED.  The SARS-CoV-2 RNA is generally detectable in upper respiratory specimens during the acute phase of infection. The lowest concentration of SARS-CoV-2 viral copies this assay can detect is 138  copies/mL. A negative result does not preclude SARS-Cov-2 infection and should not be used as the sole basis for treatment or other patient management decisions. A negative result may occur with  improper specimen collection/handling, submission of specimen other than nasopharyngeal swab, presence of viral mutation(s) within the areas targeted by this assay, and inadequate number of viral copies(<138 copies/mL). A negative result must be combined with clinical observations, patient history, and epidemiological information. The expected result is Negative.  Fact Sheet for Patients:  EntrepreneurPulse.com.au  Fact Sheet for Healthcare Providers:  IncredibleEmployment.be  This test is no t yet approved or cleared by the [place] FDA and  has been authorized for detection and/or diagnosis of SARS-CoV-2 by FDA under an Emergency Use Authorization (EUA). This EUA will remain  in effect (meaning this test can be used) for the duration of the COVID-19 declaration under Section 564(b)(1) of the Act, 21 U.S.C.section 360bbb-3(b)(1), unless the authorization is terminated  or revoked sooner.       Influenza A by PCR NEGATIVE NEGATIVE Final   Influenza B by PCR NEGATIVE NEGATIVE Final    Comment: (NOTE) The Xpert Xpress SARS-CoV-2/FLU/RSV plus assay is intended as an aid in the diagnosis of influenza from Nasopharyngeal swab specimens and should not be used as a sole  basis for treatment. Nasal washings and aspirates are unacceptable for Xpert Xpress SARS-CoV-2/FLU/RSV testing.  Fact Sheet for Patients: EntrepreneurPulse.com.au  Fact Sheet for Healthcare Providers: IncredibleEmployment.be  This test is not yet approved or cleared by the [place] FDA and has been authorized for detection and/or diagnosis of SARS-CoV-2 by FDA under an Emergency Use Authorization (EUA). This EUA will remain in effect (meaning  this test can be used) for the duration of the COVID-19 declaration under Section 564(b)(1) of the Act, 21 U.S.C. section 360bbb-3(b)(1), unless the authorization is terminated or revoked.  Performed at [facility], [address] [place], [place] [address]   Blood Culture (routine x 2)     Status: None (Preliminary result)   Collection Time: 03/14/21 12:35 PM   Specimen: Left Antecubital; Blood  Result Value Ref Range Status   Specimen Description   Final    LEFT ANTECUBITAL BOTTLES DRAWN AEROBIC AND ANAEROBIC   Special Requests Blood Culture adequate volume  Final   Culture   Final    NO GROWTH 3 DAYS Performed at [facility], [address], [place], [place] [address]    Report Status PENDING  Incomplete  Blood Culture (routine x 2)     Status: None (Preliminary result)   Collection Time: 03/14/21 12:55 PM   Specimen: BLOOD RIGHT HAND  Result Value Ref Range Status   Specimen Description   Final    BLOOD RIGHT HAND BOTTLES DRAWN AEROBIC AND ANAEROBIC   Special Requests Blood Culture adequate volume  Final   Culture   Final    NO GROWTH 3 DAYS Performed at [facility], [address] [place], [place] [address]    Report Status PENDING  Incomplete  Urine Culture     Status: Abnormal   Collection Time: 03/14/21  5:29 PM   Specimen: Urine, Clean Catch  Result Value Ref Range Status   Specimen Description   Final    URINE, CLEAN CATCH Performed at [facility], [address], [place], [place] [address]    Special Requests   Final    NONE Performed at [facility], [address], [place], [place] [address]    Culture (A)  Final    <10,000 COLONIES/mL INSIGNIFICANT GROWTH Performed at [facility], [place] [address], [place], [place] [address]    Report Status 03/16/2021 FINAL  Final  Expectorated Sputum Assessment w Gram Stain, Rflx to Resp Cult     Status: None   Collection Time: 03/15/21  8:08 AM   Specimen: Sputum  Result Value Ref Range Status   Specimen  Description SPUTUM  Final   Special Requests NONE  Final   Sputum evaluation   Final    THIS SPECIMEN IS ACCEPTABLE FOR SPUTUM CULTURE Performed at [facility], [address] [place], [place] [address]    Report Status 03/15/2021 FINAL  Final  Culture, Respiratory w Gram Stain     Status: None (Preliminary result)   Collection Time: 03/15/21  8:08 AM   Specimen: SPU  Result Value Ref Range Status   Specimen Description   Final    SPUTUM Performed at [facility], [address] [place], [place] [address]    Special Requests   Final    NONE Reflexed from [id number] Performed at [facility], [address], [place], [place] [address]    Gram Stain   Final    RARE SQUAMOUS EPITHELIAL CELLS PRESENT FEW GRAM POSITIVE COCCI IN CLUSTERS    Culture  Final    MODERATE ROTHIA MUCILAGINOSA Standardized susceptibility testing for this organism is not available. Performed at [facility], [place] [address], [place], [place] [address]    Report Status PENDING  Incomplete  MRSA Next Gen by PCR, Nasal     Status: None   Collection Time: 03/15/21  4:25 PM   Specimen: Nasal Mucosa; Nasal Swab  Result Value Ref Range Status   MRSA by PCR Next Gen NOT DETECTED NOT DETECTED Final    Comment: (NOTE) The GeneXpert MRSA Assay (FDA approved for NASAL specimens only), is one component of a comprehensive MRSA colonization surveillance program. It is not intended to diagnose MRSA infection nor to guide or monitor treatment for MRSA infections. Test performance is not FDA approved in patients less than 77 years old. Performed at [facility], [address], [place], [place] [address]     Labs: CBC: Recent Labs  Lab 03/14/21 1235 03/15/21 0520 03/15/21 1548 03/16/21 0459 03/17/21 0551  WBC 33.2* 32.7* 35.7* 23.1* 16.2*  NEUTROABS 0.7* 2.0  --  0.9* 0.8*  HGB 8.6* 8.0* 9.2* 8.8* 8.6*  HCT 26.6* 25.0* 29.1* 26.8* 27.7*  MCV 107.7* 108.7* 103.2* 103.5* 104.1*  PLT 67* 63* 67* 58* 66*    Basic Metabolic Panel: Recent Labs  Lab 03/14/21 1235 03/15/21 0520 03/16/21 0459 03/17/21 0551  NA 134* 136 135 137  K 3.5 3.9 3.4* 3.8  CL 106 109 109 110  CO2 22 21* 19* 20*  GLUCOSE 141* 110* 100* 101*  BUN 35* 31* 30* 31*  CREATININE 2.11* 1.56* 1.27* 1.24  CALCIUM 8.1* 7.6* 7.6* 7.8*   Liver Function Tests: Recent Labs  Lab 03/14/21 1235 03/15/21 0520 03/16/21 0459 03/17/21 0551  AST 18 17 11* 10*  ALT _0 ALKPHOS 48 47 50 52  BILITOT 1.5* 0.8 1.2 1.1  PROT 6.6 5.8* 5.6* 5.8*  ALBUMIN 2.8* 2.5* 2.4* 2.5*   CBG: Recent Labs  Lab 03/15/21 0818 03/16/21 0753 03/17/21 0750  GLUCAP 130* 116* 93    Discharge time spent: greater than 30 minutes.  Signed: Deatra James, MD [facility] 03/17/2021

## 2021-03-17 NOTE — Plan of Care (Signed)

## 2021-03-17 NOTE — Progress Notes (Signed)
Nsg Discharge Note ? ?Admit Date:  03/14/2021 ?Discharge date: 03/17/2021 ?  ?Vincent Carter to be D/C'd Home per MD order.  AVS completed.  Copy for chart, and copy for patient signed, and dated. ?Patient/caregiver able to verbalize understanding. ? ?Discharge Medication: ?Allergies as of 03/17/2021   ? ?   Reactions  ? Augmentin [amoxicillin-pot Clavulanate] Diarrhea, Nausea And Vomiting  ? Codeine Nausea Only  ? Pill form of codeine causes nausea  ? Statins Other (See Comments)  ? Myalgia   ? ?  ? ?  ?Medication List  ?  ? ?STOP taking these medications   ? ?clindamycin 300 MG capsule ?Commonly known as: CLEOCIN ?  ?hydrochlorothiazide 25 MG tablet ?Commonly known as: HYDRODIURIL ?  ?lisinopril 2.5 MG tablet ?Commonly known as: ZESTRIL ?  ?predniSONE 10 MG tablet ?Commonly known as: DELTASONE ?  ? ?  ? ?TAKE these medications   ? ?carvedilol 12.5 MG tablet ?Commonly known as: COREG ?Take 1 tablet (12.5 mg total) by mouth 2 (two) times daily with a meal. ?What changed: when to take this ?  ?dextromethorphan-guaiFENesin 30-600 MG 12hr tablet ?Commonly known as: [place] DM ?Take 1 tablet by mouth 2 (two) times daily. ?What changed:  ?when to take this ?reasons to take this ?  ?Fish Oil 300 MG Caps ?Take 2 capsules by mouth 2 (two) times daily. ?  ?fluticasone 50 MCG/ACT nasal spray ?Commonly known as: FLONASE ?Place 2 sprays into both nostrils daily. ?  ?furosemide 20 MG tablet ?Commonly known as: Lasix ?Take 1 tablet (20 mg total) by mouth daily. ?  ?ipratropium-albuterol 0.5-2.5 (3) MG/3ML Soln ?Commonly known as: DUONEB ?Take 3 mLs by nebulization every 6 (six) hours as needed. ?  ?levofloxacin 500 MG tablet ?Commonly known as: LEVAQUIN ?Take 1 tablet (500 mg total) by mouth daily for 4 days. ?Start taking on: March 18, 2021 ?  ?montelukast 10 MG tablet ?Commonly known as: SINGULAIR ?TAKE 1 TABLET BY MOUTH AT BEDTIME ?  ?Nebulizer Misc ?Please provide 1 nebulizer machine and all supplies. Diagnosis: J44.1, Office  number: [phone] ?  ?nitroGLYCERIN 0.4 MG SL tablet ?Commonly known as: NITROSTAT ?PLACE 1 TABLET UNDER TONGUE EVERY 5 MINUTES AS NEEDED FOR CHEST PAIN. ?  ?potassium chloride SA 20 MEQ tablet ?Commonly known as: KLOR-CON M ?Take 1 tablet (20 mEq total) by mouth 2 (two) times daily for 3 days. ?  ?Symbicort 80-4.5 MCG/ACT inhaler ?Generic drug: budesonide-formoterol ?SMARTSIG:2 Puff(s) Via Inhaler Morning-Evening ?  ?warfarin 2.5 MG tablet ?Commonly known as: COUMADIN ?Take as directed. If you are unsure how to take this medication, talk to your nurse or doctor. ?Original instructions: Take 1.5 tablets (3.75 mg total) every Monday, Wednesday, Friday, and Saturday, and 1 tablet (2.5 mg) on other days ?  ? ?  ? ? ?Discharge Assessment: ?Vitals:  ? 03/17/21 0827 03/17/21 0855  ?BP: 127/69   ?Pulse: 66   ?Resp: 18   ?Temp: 97.7 ?F (36.5 ?C)   ?SpO2: 94% 95%  ? Skin clean, dry and intact without evidence of skin break down, no evidence of skin tears noted. ?IV catheter discontinued intact. Site without signs and symptoms of complications - no redness or edema noted at insertion site, patient denies c/o pain - only slight tenderness at site.  Dressing with slight pressure applied. ? ?D/c Instructions-Education: ?Discharge instructions given to patient/family with verbalized understanding. ?D/c education completed with patient/family including follow up instructions, medication list, d/c activities limitations if indicated, with other d/c instructions as indicated  by MD - patient able to verbalize understanding, all questions fully answered. ?Patient instructed to return to ED, call 911, or call MD for any changes in condition.  ?Patient escorted via [place], and D/C home via private auto. ? ?Loa Socks, RN ?03/17/2021 11:04 AM  ?

## 2021-03-17 NOTE — Progress Notes (Signed)
OT Cancellation Note ? ?Patient Details ?Name: Vincent Carter ?MRN: [id number] ?DOB: 10/05/1945 ? ? ?Cancelled Treatment:    Reason Eval/Treat Not Completed: OT screened, no needs identified, will sign off. Pt able to transfer to toilet and demonstrate G B UE strength. Pt at or near baseline levels for ADL's.  ? ?Isai Gottlieb OT, MOT ? ? ?Larey Seat ?03/17/2021, 10:43 AM ?

## 2021-03-18 LAB — CULTURE, RESPIRATORY W GRAM STAIN

## 2021-03-19 ENCOUNTER — Encounter (HOSPITAL_COMMUNITY): Payer: Self-pay | Admitting: Hematology

## 2021-03-19 LAB — CULTURE, BLOOD (ROUTINE X 2)
Culture: NO GROWTH
Culture: NO GROWTH
Special Requests: ADEQUATE
Special Requests: ADEQUATE

## 2021-03-19 LAB — MISC LABCORP TEST (SEND OUT): Labcorp test code: 451953

## 2021-03-20 DIAGNOSIS — J449 Chronic obstructive pulmonary disease, unspecified: Secondary | ICD-10-CM | POA: Diagnosis not present

## 2021-03-20 DIAGNOSIS — C9200 Acute myeloblastic leukemia, not having achieved remission: Secondary | ICD-10-CM | POA: Diagnosis not present

## 2021-03-20 DIAGNOSIS — C9500 Acute leukemia of unspecified cell type not having achieved remission: Secondary | ICD-10-CM | POA: Diagnosis not present

## 2021-03-20 DIAGNOSIS — D630 Anemia in neoplastic disease: Secondary | ICD-10-CM | POA: Diagnosis not present

## 2021-03-20 DIAGNOSIS — Z951 Presence of aortocoronary bypass graft: Secondary | ICD-10-CM | POA: Diagnosis not present

## 2021-03-20 DIAGNOSIS — E8779 Other fluid overload: Secondary | ICD-10-CM | POA: Diagnosis not present

## 2021-03-20 DIAGNOSIS — D6959 Other secondary thrombocytopenia: Secondary | ICD-10-CM | POA: Diagnosis not present

## 2021-03-20 DIAGNOSIS — I2510 Atherosclerotic heart disease of native coronary artery without angina pectoris: Secondary | ICD-10-CM | POA: Diagnosis not present

## 2021-03-20 DIAGNOSIS — Z79899 Other long term (current) drug therapy: Secondary | ICD-10-CM | POA: Diagnosis not present

## 2021-03-20 DIAGNOSIS — E538 Deficiency of other specified B group vitamins: Secondary | ICD-10-CM | POA: Diagnosis not present

## 2021-03-23 LAB — SURGICAL PATHOLOGY

## 2021-03-24 ENCOUNTER — Other Ambulatory Visit (HOSPITAL_COMMUNITY): Payer: Self-pay

## 2021-03-24 ENCOUNTER — Telehealth: Payer: Self-pay | Admitting: Cardiology

## 2021-03-24 ENCOUNTER — Encounter (HOSPITAL_COMMUNITY): Payer: Self-pay | Admitting: Hematology

## 2021-03-24 DIAGNOSIS — I2510 Atherosclerotic heart disease of native coronary artery without angina pectoris: Secondary | ICD-10-CM | POA: Diagnosis not present

## 2021-03-24 DIAGNOSIS — E1151 Type 2 diabetes mellitus with diabetic peripheral angiopathy without gangrene: Secondary | ICD-10-CM | POA: Diagnosis not present

## 2021-03-24 DIAGNOSIS — D61810 Antineoplastic chemotherapy induced pancytopenia: Secondary | ICD-10-CM | POA: Diagnosis not present

## 2021-03-24 DIAGNOSIS — C9500 Acute leukemia of unspecified cell type not having achieved remission: Secondary | ICD-10-CM

## 2021-03-24 DIAGNOSIS — J439 Emphysema, unspecified: Secondary | ICD-10-CM | POA: Diagnosis not present

## 2021-03-24 DIAGNOSIS — J811 Chronic pulmonary edema: Secondary | ICD-10-CM | POA: Diagnosis not present

## 2021-03-24 DIAGNOSIS — C9200 Acute myeloblastic leukemia, not having achieved remission: Secondary | ICD-10-CM | POA: Diagnosis not present

## 2021-03-24 DIAGNOSIS — E8770 Fluid overload, unspecified: Secondary | ICD-10-CM | POA: Diagnosis not present

## 2021-03-24 DIAGNOSIS — Z951 Presence of aortocoronary bypass graft: Secondary | ICD-10-CM | POA: Diagnosis not present

## 2021-03-24 DIAGNOSIS — J9691 Respiratory failure, unspecified with hypoxia: Secondary | ICD-10-CM | POA: Diagnosis not present

## 2021-03-24 DIAGNOSIS — Z955 Presence of coronary angioplasty implant and graft: Secondary | ICD-10-CM | POA: Diagnosis not present

## 2021-03-24 DIAGNOSIS — R0789 Other chest pain: Secondary | ICD-10-CM | POA: Diagnosis not present

## 2021-03-24 DIAGNOSIS — K5900 Constipation, unspecified: Secondary | ICD-10-CM | POA: Diagnosis not present

## 2021-03-24 DIAGNOSIS — E119 Type 2 diabetes mellitus without complications: Secondary | ICD-10-CM | POA: Diagnosis not present

## 2021-03-24 DIAGNOSIS — E538 Deficiency of other specified B group vitamins: Secondary | ICD-10-CM | POA: Diagnosis not present

## 2021-03-24 DIAGNOSIS — T451X5A Adverse effect of antineoplastic and immunosuppressive drugs, initial encounter: Secondary | ICD-10-CM | POA: Diagnosis not present

## 2021-03-24 DIAGNOSIS — D61818 Other pancytopenia: Secondary | ICD-10-CM | POA: Diagnosis not present

## 2021-03-24 DIAGNOSIS — N390 Urinary tract infection, site not specified: Secondary | ICD-10-CM | POA: Diagnosis not present

## 2021-03-24 DIAGNOSIS — I252 Old myocardial infarction: Secondary | ICD-10-CM | POA: Diagnosis not present

## 2021-03-24 DIAGNOSIS — I080 Rheumatic disorders of both mitral and aortic valves: Secondary | ICD-10-CM | POA: Diagnosis not present

## 2021-03-24 DIAGNOSIS — J449 Chronic obstructive pulmonary disease, unspecified: Secondary | ICD-10-CM | POA: Diagnosis not present

## 2021-03-24 DIAGNOSIS — I5022 Chronic systolic (congestive) heart failure: Secondary | ICD-10-CM | POA: Diagnosis not present

## 2021-03-24 DIAGNOSIS — Z5111 Encounter for antineoplastic chemotherapy: Secondary | ICD-10-CM | POA: Diagnosis not present

## 2021-03-24 DIAGNOSIS — E876 Hypokalemia: Secondary | ICD-10-CM | POA: Diagnosis not present

## 2021-03-24 DIAGNOSIS — I110 Hypertensive heart disease with heart failure: Secondary | ICD-10-CM | POA: Diagnosis not present

## 2021-03-24 DIAGNOSIS — R059 Cough, unspecified: Secondary | ICD-10-CM | POA: Diagnosis not present

## 2021-03-24 DIAGNOSIS — I25810 Atherosclerosis of coronary artery bypass graft(s) without angina pectoris: Secondary | ICD-10-CM | POA: Diagnosis not present

## 2021-03-24 DIAGNOSIS — E785 Hyperlipidemia, unspecified: Secondary | ICD-10-CM | POA: Diagnosis not present

## 2021-03-24 DIAGNOSIS — I509 Heart failure, unspecified: Secondary | ICD-10-CM | POA: Diagnosis not present

## 2021-03-24 DIAGNOSIS — Z95828 Presence of other vascular implants and grafts: Secondary | ICD-10-CM | POA: Diagnosis not present

## 2021-03-24 DIAGNOSIS — J90 Pleural effusion, not elsewhere classified: Secondary | ICD-10-CM | POA: Diagnosis not present

## 2021-03-24 DIAGNOSIS — Z87891 Personal history of nicotine dependence: Secondary | ICD-10-CM | POA: Diagnosis not present

## 2021-03-24 NOTE — Telephone Encounter (Signed)
No answer at this number then goes to busy signal,  Will try later. ?

## 2021-03-24 NOTE — Telephone Encounter (Signed)
Vincent Carter was calling with drug interaction on the warfarin.  ?

## 2021-03-24 NOTE — Progress Notes (Signed)
Labs ordered per recommendation from [facility]. ?

## 2021-03-24 NOTE — Telephone Encounter (Signed)
Called number listed again.  Phone rang but no one answered then rolled over to message that stated call could not be delivered at this time.  Will retry tomorrow. ?

## 2021-03-25 NOTE — Telephone Encounter (Signed)
Patient is admitted to [facility] for treatment of leukemia.  He has been taken off warfarin for now per hospital records. ?

## 2021-03-26 ENCOUNTER — Ambulatory Visit (HOSPITAL_COMMUNITY): Payer: Medicare Other | Admitting: Hematology

## 2021-03-30 ENCOUNTER — Ambulatory Visit (HOSPITAL_COMMUNITY): Payer: Medicare Other | Admitting: Hematology

## 2021-03-30 ENCOUNTER — Other Ambulatory Visit (HOSPITAL_COMMUNITY): Payer: Medicare Other

## 2021-04-02 ENCOUNTER — Other Ambulatory Visit: Payer: Self-pay | Admitting: *Deleted

## 2021-04-02 ENCOUNTER — Encounter (HOSPITAL_COMMUNITY): Payer: Self-pay

## 2021-04-02 ENCOUNTER — Other Ambulatory Visit (HOSPITAL_COMMUNITY): Payer: Medicare Other

## 2021-04-02 ENCOUNTER — Telehealth: Payer: Self-pay | Admitting: *Deleted

## 2021-04-02 ENCOUNTER — Inpatient Hospital Stay (HOSPITAL_COMMUNITY): Payer: Medicare Other | Attending: Hematology

## 2021-04-02 ENCOUNTER — Other Ambulatory Visit (HOSPITAL_COMMUNITY): Payer: Self-pay | Admitting: *Deleted

## 2021-04-02 VITALS — BP 101/64 | HR 70 | Temp 97.2°F | Resp 20

## 2021-04-02 DIAGNOSIS — C9500 Acute leukemia of unspecified cell type not having achieved remission: Secondary | ICD-10-CM

## 2021-04-02 DIAGNOSIS — Z87891 Personal history of nicotine dependence: Secondary | ICD-10-CM | POA: Insufficient documentation

## 2021-04-02 DIAGNOSIS — C9200 Acute myeloblastic leukemia, not having achieved remission: Secondary | ICD-10-CM | POA: Insufficient documentation

## 2021-04-02 DIAGNOSIS — Z452 Encounter for adjustment and management of vascular access device: Secondary | ICD-10-CM

## 2021-04-02 DIAGNOSIS — I110 Hypertensive heart disease with heart failure: Secondary | ICD-10-CM | POA: Insufficient documentation

## 2021-04-02 DIAGNOSIS — C61 Malignant neoplasm of prostate: Secondary | ICD-10-CM

## 2021-04-02 DIAGNOSIS — Z8042 Family history of malignant neoplasm of prostate: Secondary | ICD-10-CM | POA: Insufficient documentation

## 2021-04-02 DIAGNOSIS — Z801 Family history of malignant neoplasm of trachea, bronchus and lung: Secondary | ICD-10-CM | POA: Diagnosis not present

## 2021-04-02 DIAGNOSIS — I509 Heart failure, unspecified: Secondary | ICD-10-CM | POA: Diagnosis not present

## 2021-04-02 DIAGNOSIS — I4891 Unspecified atrial fibrillation: Secondary | ICD-10-CM | POA: Diagnosis not present

## 2021-04-02 LAB — CBC WITH DIFFERENTIAL/PLATELET
Basophils Absolute: 0 10*3/uL (ref 0.0–0.1)
Basophils Relative: 0 %
Eosinophils Absolute: 0 10*3/uL (ref 0.0–0.5)
Eosinophils Relative: 0 %
HCT: 30.6 % — ABNORMAL LOW (ref 39.0–52.0)
Hemoglobin: 9.8 g/dL — ABNORMAL LOW (ref 13.0–17.0)
Lymphocytes Relative: 84 %
Lymphs Abs: 1 10*3/uL (ref 0.7–4.0)
MCH: 31.1 pg (ref 26.0–34.0)
MCHC: 32 g/dL (ref 30.0–36.0)
MCV: 97.1 fL (ref 80.0–100.0)
Monocytes Absolute: 0.2 10*3/uL (ref 0.1–1.0)
Monocytes Relative: 14 %
Neutro Abs: 0 10*3/uL — CL (ref 1.7–7.7)
Neutrophils Relative %: 2 %
Platelets: 26 10*3/uL — CL (ref 150–400)
RBC: 3.15 MIL/uL — ABNORMAL LOW (ref 4.22–5.81)
RDW: 17.6 % — ABNORMAL HIGH (ref 11.5–15.5)
WBC: 1.2 10*3/uL — CL (ref 4.0–10.5)
nRBC: 0 % (ref 0.0–0.2)

## 2021-04-02 LAB — COMPREHENSIVE METABOLIC PANEL
ALT: 17 U/L (ref 0–44)
AST: 17 U/L (ref 15–41)
Albumin: 2.6 g/dL — ABNORMAL LOW (ref 3.5–5.0)
Alkaline Phosphatase: 116 U/L (ref 38–126)
Anion gap: 7 (ref 5–15)
BUN: 31 mg/dL — ABNORMAL HIGH (ref 8–23)
CO2: 23 mmol/L (ref 22–32)
Calcium: 8.4 mg/dL — ABNORMAL LOW (ref 8.9–10.3)
Chloride: 106 mmol/L (ref 98–111)
Creatinine, Ser: 1.6 mg/dL — ABNORMAL HIGH (ref 0.61–1.24)
GFR, Estimated: 44 mL/min — ABNORMAL LOW (ref >60)
Glucose, Bld: 118 mg/dL — ABNORMAL HIGH (ref 70–99)
Potassium: 4.1 mmol/L (ref 3.5–5.1)
Sodium: 136 mmol/L (ref 135–145)
Total Bilirubin: 3.1 mg/dL — ABNORMAL HIGH (ref 0.3–1.2)
Total Protein: 6.7 g/dL (ref 6.5–8.1)

## 2021-04-02 LAB — LACTATE DEHYDROGENASE: LDH: 145 U/L (ref 98–192)

## 2021-04-02 LAB — URIC ACID: Uric Acid, Serum: 5 mg/dL (ref 3.7–8.6)

## 2021-04-02 LAB — MAGNESIUM: Magnesium: 2.3 mg/dL (ref 1.7–2.4)

## 2021-04-02 LAB — PHOSPHORUS: Phosphorus: 3.2 mg/dL (ref 2.5–4.6)

## 2021-04-02 MED ORDER — SODIUM CHLORIDE 0.9% FLUSH
10.0000 mL | Freq: Once | INTRAVENOUS | Status: AC
  Administered 2021-04-02: 10 mL via INTRAVENOUS

## 2021-04-02 MED ORDER — ALTEPLASE 2 MG IJ SOLR
2.0000 mg | Freq: Once | INTRAMUSCULAR | Status: AC
  Administered 2021-04-02: 2 mg

## 2021-04-02 MED ORDER — HEPARIN SOD (PORK) LOCK FLUSH 100 UNIT/ML IV SOLN
250.0000 [IU] | Freq: Once | INTRAVENOUS | Status: AC
  Administered 2021-04-02: 250 [IU] via INTRAVENOUS

## 2021-04-02 NOTE — Progress Notes (Signed)
[facility] Triage line called to report critical values. Spoke with Kalman Shan, RN. No immediate actions required per Rose. Message also left that patient's PICC line is not giving any blood return despite Alteplase. Triage nurse has sent a message to Dr. Florene Glen to make him aware of that.  ?

## 2021-04-02 NOTE — Progress Notes (Signed)
Patient presents today for Lab draw and dressing change. PICC line flushed with no blood return noted, alteplase placed at 1329, at 1400 no blood return noted, at 1430 blood return noted. PICC flushed with good blood return noted. No bruising or swelling at site. Dressing changed and patient discharged in satisfactory condition. VVS stable with no signs or symptoms of distressed noted.  ?

## 2021-04-02 NOTE — Patient Instructions (Signed)
[facility]  Discharge Instructions: ?Thank you for choosing [facility] to provide your oncology and hematology care.  ?If you have a lab appointment with the [facility], please come in thru the Main Entrance and check in at the main information desk. ? ?Wear comfortable clothing and clothing appropriate for easy access to any Portacath or PICC line.  ? ?We strive to give you quality time with your provider. You may need to reschedule your appointment if you arrive late (15 or more minutes).  Arriving late affects you and other patients whose appointments are after yours.  Also, if you miss three or more appointments without notifying the office, you may be dismissed from the clinic at the provider?s discretion.    ?  ?For prescription refill requests, have your pharmacy contact our office and allow 72 hours for refills to be completed.   ? ?Today your labs were drawn, and PICC was flushed and dressing was changed. Return as scheduled. ?  ?To help prevent nausea and vomiting after your treatment, we encourage you to take your nausea medication as directed. ? ?BELOW ARE SYMPTOMS THAT SHOULD BE REPORTED IMMEDIATELY: ?*FEVER GREATER THAN 100.4 F (38 ?C) OR HIGHER ?*CHILLS OR SWEATING ?*NAUSEA AND VOMITING THAT IS NOT CONTROLLED WITH YOUR NAUSEA MEDICATION ?*UNUSUAL SHORTNESS OF BREATH ?*UNUSUAL BRUISING OR BLEEDING ?*URINARY PROBLEMS (pain or burning when urinating, or frequent urination) ?*BOWEL PROBLEMS (unusual diarrhea, constipation, pain near the anus) ?TENDERNESS IN MOUTH AND THROAT WITH OR WITHOUT PRESENCE OF ULCERS (sore throat, sores in mouth, or a toothache) ?UNUSUAL RASH, SWELLING OR PAIN  ?UNUSUAL VAGINAL DISCHARGE OR ITCHING  ? ?Items with * indicate a potential emergency and should be followed up as soon as possible or go to the Emergency Department if any problems should occur. ? ?Please show the CHEMOTHERAPY ALERT CARD or IMMUNOTHERAPY ALERT CARD at check-in to the  Emergency Department and triage nurse. ? ?Should you have questions after your visit or need to cancel or reschedule your appointment, please contact [facility] [phone]  and follow the prompts.  Office hours are 8:00 a.m. to 4:30 p.m. Monday - Friday. Please note that voicemails left after 4:00 p.m. may not be returned until the following business day.  We are closed weekends and major holidays. You have access to a nurse at all times for urgent questions. Please call the main number to the clinic [phone] and follow the prompts. ? ?For any non-urgent questions, you may also contact your provider using MyChart. We now offer e-Visits for anyone 20 and older to request care online for non-urgent symptoms. For details visit [email or web address]. ?  ?Also download the MyChart app! Go to the app store, search "MyChart", open the app, select [facility], and log in with your MyChart username and password. ? ?Due to Covid, a mask is required upon entering the hospital/clinic. If you do not have a mask, one will be given to you upon arrival. For doctor visits, patients may have 1 support person aged 85 or older with them. For treatment visits, patients cannot have anyone with them due to current Covid guidelines and our immunocompromised population.  ?

## 2021-04-02 NOTE — Telephone Encounter (Signed)
Pt missed appointment today to have INR checked. Called pt and spoke to his daughter who stated that pt was taken off warfarin last week when he was a pt at [facility].  ?

## 2021-04-02 NOTE — Progress Notes (Signed)
CRITICAL VALUE ALERT ?Critical value received:  WBC 1.2, Platelet 26, ANC 0.0 ?Date of notification:  04/02/2021  ?Time of notification: 1610 ?Critical value read back:  Yes.   ?Nurse who received alert:  Ronnald Nian RN ?MD notified time and response:  Per [facility] standing orders; no orders needed at this time d/t standing order parameters, Labs faxed to [facility]  ?

## 2021-04-06 ENCOUNTER — Inpatient Hospital Stay (HOSPITAL_COMMUNITY): Payer: Medicare Other

## 2021-04-06 ENCOUNTER — Other Ambulatory Visit (HOSPITAL_COMMUNITY): Payer: Self-pay | Admitting: Hematology

## 2021-04-06 ENCOUNTER — Other Ambulatory Visit: Payer: Self-pay

## 2021-04-06 ENCOUNTER — Ambulatory Visit: Payer: Self-pay | Admitting: *Deleted

## 2021-04-06 ENCOUNTER — Encounter (HOSPITAL_COMMUNITY): Payer: Self-pay

## 2021-04-06 VITALS — BP 105/57 | HR 55 | Temp 97.5°F | Resp 20

## 2021-04-06 DIAGNOSIS — C61 Malignant neoplasm of prostate: Secondary | ICD-10-CM

## 2021-04-06 DIAGNOSIS — I4891 Unspecified atrial fibrillation: Secondary | ICD-10-CM | POA: Diagnosis not present

## 2021-04-06 DIAGNOSIS — Z801 Family history of malignant neoplasm of trachea, bronchus and lung: Secondary | ICD-10-CM | POA: Diagnosis not present

## 2021-04-06 DIAGNOSIS — I509 Heart failure, unspecified: Secondary | ICD-10-CM | POA: Diagnosis not present

## 2021-04-06 DIAGNOSIS — C9200 Acute myeloblastic leukemia, not having achieved remission: Secondary | ICD-10-CM | POA: Diagnosis not present

## 2021-04-06 DIAGNOSIS — Z87891 Personal history of nicotine dependence: Secondary | ICD-10-CM | POA: Diagnosis not present

## 2021-04-06 DIAGNOSIS — I110 Hypertensive heart disease with heart failure: Secondary | ICD-10-CM | POA: Diagnosis not present

## 2021-04-06 DIAGNOSIS — Z452 Encounter for adjustment and management of vascular access device: Secondary | ICD-10-CM

## 2021-04-06 DIAGNOSIS — C9500 Acute leukemia of unspecified cell type not having achieved remission: Secondary | ICD-10-CM

## 2021-04-06 LAB — CBC WITH DIFFERENTIAL/PLATELET
Basophils Absolute: 0 10*3/uL (ref 0.0–0.1)
Basophils Relative: 0 %
Eosinophils Absolute: 0 10*3/uL (ref 0.0–0.5)
Eosinophils Relative: 1 %
HCT: 22.7 % — ABNORMAL LOW (ref 39.0–52.0)
Hemoglobin: 7.5 g/dL — ABNORMAL LOW (ref 13.0–17.0)
Lymphocytes Relative: 71 %
Lymphs Abs: 0.9 10*3/uL (ref 0.7–4.0)
MCH: 32.2 pg (ref 26.0–34.0)
MCHC: 33 g/dL (ref 30.0–36.0)
MCV: 97.4 fL (ref 80.0–100.0)
Monocytes Absolute: 0.4 10*3/uL (ref 0.1–1.0)
Monocytes Relative: 27 %
Neutro Abs: 0 10*3/uL — CL (ref 1.7–7.7)
Neutrophils Relative %: 1 %
Platelets: 5 10*3/uL — CL (ref 150–400)
RBC: 2.33 MIL/uL — ABNORMAL LOW (ref 4.22–5.81)
RDW: 16.4 % — ABNORMAL HIGH (ref 11.5–15.5)
WBC: 1.3 10*3/uL — CL (ref 4.0–10.5)
nRBC: 0 % (ref 0.0–0.2)

## 2021-04-06 LAB — COMPREHENSIVE METABOLIC PANEL
ALT: 16 U/L (ref 0–44)
AST: 13 U/L — ABNORMAL LOW (ref 15–41)
Albumin: 2.5 g/dL — ABNORMAL LOW (ref 3.5–5.0)
Alkaline Phosphatase: 94 U/L (ref 38–126)
Anion gap: 5 (ref 5–15)
BUN: 33 mg/dL — ABNORMAL HIGH (ref 8–23)
CO2: 25 mmol/L (ref 22–32)
Calcium: 8.1 mg/dL — ABNORMAL LOW (ref 8.9–10.3)
Chloride: 104 mmol/L (ref 98–111)
Creatinine, Ser: 1.84 mg/dL — ABNORMAL HIGH (ref 0.61–1.24)
GFR, Estimated: 37 mL/min — ABNORMAL LOW (ref >60)
Glucose, Bld: 106 mg/dL — ABNORMAL HIGH (ref 70–99)
Potassium: 3.4 mmol/L — ABNORMAL LOW (ref 3.5–5.1)
Sodium: 134 mmol/L — ABNORMAL LOW (ref 135–145)
Total Bilirubin: 2.9 mg/dL — ABNORMAL HIGH (ref 0.3–1.2)
Total Protein: 5.8 g/dL — ABNORMAL LOW (ref 6.5–8.1)

## 2021-04-06 LAB — SAMPLE TO BLOOD BANK

## 2021-04-06 LAB — PREPARE RBC (CROSSMATCH)

## 2021-04-06 LAB — MAGNESIUM: Magnesium: 1.9 mg/dL (ref 1.7–2.4)

## 2021-04-06 MED ORDER — DIPHENHYDRAMINE HCL 25 MG PO CAPS
25.0000 mg | ORAL_CAPSULE | Freq: Once | ORAL | Status: AC
  Administered 2021-04-06: 25 mg via ORAL
  Filled 2021-04-06: qty 1

## 2021-04-06 MED ORDER — SODIUM CHLORIDE 0.9% IV SOLUTION
250.0000 mL | Freq: Once | INTRAVENOUS | Status: AC
  Administered 2021-04-06: 250 mL via INTRAVENOUS

## 2021-04-06 MED ORDER — SODIUM CHLORIDE 0.9% FLUSH
10.0000 mL | INTRAVENOUS | Status: AC | PRN
  Administered 2021-04-06: 10 mL

## 2021-04-06 MED ORDER — MAGNESIUM SULFATE 2 GM/50ML IV SOLN: 2.0000 g | Freq: Once | INTRAVENOUS | 0 refills | Status: DC

## 2021-04-06 MED ORDER — HEPARIN SOD (PORK) LOCK FLUSH 100 UNIT/ML IV SOLN
500.0000 [IU] | Freq: Every day | INTRAVENOUS | Status: AC | PRN
  Administered 2021-04-06: 500 [IU]

## 2021-04-06 MED ORDER — POTASSIUM CHLORIDE IN NACL 20-0.9 MEQ/L-% IV SOLN
Freq: Once | INTRAVENOUS | Status: AC
Start: 2021-04-06 — End: 2021-04-06
  Filled 2021-04-06: qty 1000

## 2021-04-06 MED ORDER — ACETAMINOPHEN 325 MG PO TABS
650.0000 mg | ORAL_TABLET | Freq: Once | ORAL | Status: AC
  Administered 2021-04-06: 650 mg via ORAL
  Filled 2021-04-06: qty 2

## 2021-04-06 MED ORDER — MAGNESIUM SULFATE 2 GM/50ML IV SOLN
2.0000 g | Freq: Once | INTRAVENOUS | Status: AC
  Administered 2021-04-06: 2 g via INTRAVENOUS
  Filled 2021-04-06: qty 50

## 2021-04-06 NOTE — Progress Notes (Signed)
Patient presents today for PICC flush/labs. Patient reports feeling dizziness today and yesterday, BP on arrival 81/51. Dr. Worthy Keeler made aware, received orders for house fluids over 2 hours. Patient tolerated house fluids with no complaints voiced. Side effects with management reviewed with understanding verbalized. ?Per orders 2 units of PRBCs and 1 unit of platelets transfused. Patient tolerated transfusions with no complaints voiced. Side effects with management reviewed with understanding verbalized. PICC site clean and dry with no bruising or swelling noted at site. Good blood return noted before and after administration of therapy. Patient left in satisfactory condition with VSS and no s/s of distress noted.  ?

## 2021-04-06 NOTE — Progress Notes (Signed)
CRITICAL VALUE ALERT ?Critical value received:  WBC 1.3 Platelets 5 ?Date of notification:  04-06-2021 ?Time of notification: 12:04 pm ?Critical value read back:  Yes.   ?Nurse who received alert:  B. Macarena Langseth RN ?MD notified time and response:  Dr. Delton Coombes / A. Anderson RN ? ?HGB 7.5. Per [facility] standing orders infuse 1 unit of platelets and 2 units of PRBC's. Irradiated.   ?Patient has not received blood products here before. ? ?Patient states he has appointment with [facility] tomorrow 04-07-2021. Labs will be faxed.  ? ?Per Dr. Delton Coombes infuse 1 unit of platelets and 2 units of PRBC's . Blood products do NOT have to be irradiated per Dr. Delton Coombes.  ? ?CRITICAL VALUE ALERT ?Critical value received:  ANC 0.0 ?Date of notification:  04/06/2021 ?Time of notification: 13:03 pm .  ?Critical value read back:  Yes.   ?Nurse who received alert:  B. Ismael Karge RN ?MD notified time and response:  [place].   ?

## 2021-04-07 LAB — BPAM RBC
Blood Product Expiration Date: 202305012359
Blood Product Expiration Date: 202305012359
ISSUE DATE / TIME: 202303271353
ISSUE DATE / TIME: 202303271519
Unit Type and Rh: 5100
Unit Type and Rh: 5100

## 2021-04-07 LAB — PREPARE PLATELET PHERESIS: Unit division: 0

## 2021-04-07 LAB — TYPE AND SCREEN
ABO/RH(D): O POS
Antibody Screen: NEGATIVE
Unit division: 0
Unit division: 0

## 2021-04-07 LAB — BPAM PLATELET PHERESIS
Blood Product Expiration Date: 202303302359
ISSUE DATE / TIME: 202303271638
Unit Type and Rh: 6200

## 2021-04-09 ENCOUNTER — Inpatient Hospital Stay (HOSPITAL_COMMUNITY): Payer: Medicare Other | Admitting: Hematology

## 2021-04-09 ENCOUNTER — Inpatient Hospital Stay (HOSPITAL_COMMUNITY): Payer: Medicare Other

## 2021-04-09 ENCOUNTER — Inpatient Hospital Stay (HOSPITAL_COMMUNITY): Payer: Medicare Other | Admitting: Dietician

## 2021-04-09 VITALS — BP 89/51 | HR 60 | Temp 97.8°F | Resp 17 | Ht 60.24 in | Wt 140.2 lb

## 2021-04-09 DIAGNOSIS — I509 Heart failure, unspecified: Secondary | ICD-10-CM | POA: Diagnosis not present

## 2021-04-09 DIAGNOSIS — D709 Neutropenia, unspecified: Secondary | ICD-10-CM

## 2021-04-09 DIAGNOSIS — C61 Malignant neoplasm of prostate: Secondary | ICD-10-CM | POA: Diagnosis not present

## 2021-04-09 DIAGNOSIS — C9200 Acute myeloblastic leukemia, not having achieved remission: Secondary | ICD-10-CM | POA: Diagnosis not present

## 2021-04-09 DIAGNOSIS — Z87891 Personal history of nicotine dependence: Secondary | ICD-10-CM | POA: Diagnosis not present

## 2021-04-09 DIAGNOSIS — I4891 Unspecified atrial fibrillation: Secondary | ICD-10-CM | POA: Diagnosis not present

## 2021-04-09 DIAGNOSIS — C9500 Acute leukemia of unspecified cell type not having achieved remission: Secondary | ICD-10-CM

## 2021-04-09 DIAGNOSIS — E876 Hypokalemia: Secondary | ICD-10-CM

## 2021-04-09 DIAGNOSIS — Z801 Family history of malignant neoplasm of trachea, bronchus and lung: Secondary | ICD-10-CM | POA: Diagnosis not present

## 2021-04-09 DIAGNOSIS — I110 Hypertensive heart disease with heart failure: Secondary | ICD-10-CM | POA: Diagnosis not present

## 2021-04-09 LAB — CBC WITH DIFFERENTIAL/PLATELET
Abs Immature Granulocytes: 0 10*3/uL (ref 0.00–0.07)
Basophils Absolute: 0 10*3/uL (ref 0.0–0.1)
Basophils Relative: 0 %
Eosinophils Absolute: 0 10*3/uL (ref 0.0–0.5)
Eosinophils Relative: 0 %
HCT: 28.7 % — ABNORMAL LOW (ref 39.0–52.0)
Hemoglobin: 9.6 g/dL — ABNORMAL LOW (ref 13.0–17.0)
Immature Granulocytes: 0 %
Lymphocytes Relative: 57 %
Lymphs Abs: 0.9 10*3/uL (ref 0.7–4.0)
MCH: 31.9 pg (ref 26.0–34.0)
MCHC: 33.4 g/dL (ref 30.0–36.0)
MCV: 95.3 fL (ref 80.0–100.0)
Monocytes Absolute: 0.6 10*3/uL (ref 0.1–1.0)
Monocytes Relative: 41 %
Neutro Abs: 0 10*3/uL — CL (ref 1.7–7.7)
Neutrophils Relative %: 2 %
Platelets: 6 10*3/uL — CL (ref 150–400)
RBC: 3.01 MIL/uL — ABNORMAL LOW (ref 4.22–5.81)
RDW: 15.5 % (ref 11.5–15.5)
WBC: 1.5 10*3/uL — ABNORMAL LOW (ref 4.0–10.5)
nRBC: 0 % (ref 0.0–0.2)

## 2021-04-09 LAB — COMPREHENSIVE METABOLIC PANEL
ALT: 13 U/L (ref 0–44)
AST: 12 U/L — ABNORMAL LOW (ref 15–41)
Albumin: 2.5 g/dL — ABNORMAL LOW (ref 3.5–5.0)
Alkaline Phosphatase: 90 U/L (ref 38–126)
Anion gap: 7 (ref 5–15)
BUN: 24 mg/dL — ABNORMAL HIGH (ref 8–23)
CO2: 24 mmol/L (ref 22–32)
Calcium: 8.2 mg/dL — ABNORMAL LOW (ref 8.9–10.3)
Chloride: 105 mmol/L (ref 98–111)
Creatinine, Ser: 1.65 mg/dL — ABNORMAL HIGH (ref 0.61–1.24)
GFR, Estimated: 43 mL/min — ABNORMAL LOW (ref >60)
Glucose, Bld: 104 mg/dL — ABNORMAL HIGH (ref 70–99)
Potassium: 3.3 mmol/L — ABNORMAL LOW (ref 3.5–5.1)
Sodium: 136 mmol/L (ref 135–145)
Total Bilirubin: 2.2 mg/dL — ABNORMAL HIGH (ref 0.3–1.2)
Total Protein: 5.7 g/dL — ABNORMAL LOW (ref 6.5–8.1)

## 2021-04-09 LAB — SAMPLE TO BLOOD BANK

## 2021-04-09 LAB — URIC ACID: Uric Acid, Serum: 4 mg/dL (ref 3.7–8.6)

## 2021-04-09 LAB — MAGNESIUM: Magnesium: 1.9 mg/dL (ref 1.7–2.4)

## 2021-04-09 LAB — LACTATE DEHYDROGENASE: LDH: 90 U/L — ABNORMAL LOW (ref 98–192)

## 2021-04-09 LAB — PHOSPHORUS: Phosphorus: 3.1 mg/dL (ref 2.5–4.6)

## 2021-04-09 MED ORDER — SODIUM CHLORIDE 0.9% FLUSH
10.0000 mL | INTRAVENOUS | Status: AC | PRN
  Administered 2021-04-09: 10 mL

## 2021-04-09 MED ORDER — HEPARIN SOD (PORK) LOCK FLUSH 100 UNIT/ML IV SOLN
500.0000 [IU] | Freq: Every day | INTRAVENOUS | Status: AC | PRN
  Administered 2021-04-09: 500 [IU]

## 2021-04-09 MED ORDER — DIPHENHYDRAMINE HCL 25 MG PO CAPS
25.0000 mg | ORAL_CAPSULE | Freq: Once | ORAL | Status: AC
  Administered 2021-04-09: 25 mg via ORAL
  Filled 2021-04-09: qty 1

## 2021-04-09 MED ORDER — SODIUM CHLORIDE 0.9% IV SOLUTION
250.0000 mL | Freq: Once | INTRAVENOUS | Status: AC
  Administered 2021-04-09: 250 mL via INTRAVENOUS

## 2021-04-09 MED ORDER — POTASSIUM CHLORIDE CRYS ER 20 MEQ PO TBCR
40.0000 meq | EXTENDED_RELEASE_TABLET | Freq: Once | ORAL | Status: AC
  Administered 2021-04-09: 40 meq via ORAL
  Filled 2021-04-09: qty 2

## 2021-04-09 MED ORDER — SODIUM CHLORIDE 0.9 % IV SOLN: INTRAVENOUS | Status: DC

## 2021-04-09 MED ORDER — ACETAMINOPHEN 325 MG PO TABS
650.0000 mg | ORAL_TABLET | Freq: Once | ORAL | Status: AC
  Administered 2021-04-09: 650 mg via ORAL
  Filled 2021-04-09: qty 2

## 2021-04-09 NOTE — Patient Instructions (Signed)
[facility]  Discharge Instructions: Thank you for choosing [facility] to provide your oncology and hematology care.  If you have a lab appointment with the Cancer Center, please come in thru the Main Entrance and check in at the main information desk.  Wear comfortable clothing and clothing appropriate for easy access to any Portacath or PICC line.   We strive to give you quality time with your provider. You may need to reschedule your appointment if you arrive late (15 or more minutes).  Arriving late affects you and other patients whose appointments are after yours.  Also, if you miss three or more appointments without notifying the office, you may be dismissed from the clinic at the provider's discretion.      For prescription refill requests, have your pharmacy contact our office and allow 72 hours for refills to be completed.        To help prevent nausea and vomiting after your treatment, we encourage you to take your nausea medication as directed.  BELOW ARE SYMPTOMS THAT SHOULD BE REPORTED IMMEDIATELY: *FEVER GREATER THAN 100.4 F (38 C) OR HIGHER *CHILLS OR SWEATING *NAUSEA AND VOMITING THAT IS NOT CONTROLLED WITH YOUR NAUSEA MEDICATION *UNUSUAL SHORTNESS OF BREATH *UNUSUAL BRUISING OR BLEEDING *URINARY PROBLEMS (pain or burning when urinating, or frequent urination) *BOWEL PROBLEMS (unusual diarrhea, constipation, pain near the anus) TENDERNESS IN MOUTH AND THROAT WITH OR WITHOUT PRESENCE OF ULCERS (sore throat, sores in mouth, or a toothache) UNUSUAL RASH, SWELLING OR PAIN  UNUSUAL VAGINAL DISCHARGE OR ITCHING   Items with * indicate a potential emergency and should be followed up as soon as possible or go to the Emergency Department if any problems should occur.  Please show the CHEMOTHERAPY ALERT CARD or IMMUNOTHERAPY ALERT CARD at check-in to the Emergency Department and triage nurse.  Should you have questions after your visit or need to cancel  or reschedule your appointment, please contact [facility] [phone]  and follow the prompts.  Office hours are 8:00 a.m. to 4:30 p.m. Monday - Friday. Please note that voicemails left after 4:00 p.m. may not be returned until the following business day.  We are closed weekends and major holidays. You have access to a nurse at all times for urgent questions. Please call the main number to the clinic [phone] and follow the prompts.  For any non-urgent questions, you may also contact your provider using MyChart. We now offer e-Visits for anyone 18 and older to request care online for non-urgent symptoms. For details visit [email or web address].   Also download the MyChart app! Go to the app store, search "MyChart", open the app, select [facility], and log in with your MyChart username and password.  Due to Covid, a mask is required upon entering the hospital/clinic. If you do not have a mask, one will be given to you upon arrival. For doctor visits, patients may have 1 support person aged 18 or older with them. For treatment visits, patients cannot have anyone with them due to current Covid guidelines and our immunocompromised population.  

## 2021-04-09 NOTE — Progress Notes (Signed)
Nutrition Assessment ? ? ?Reason for Assessment: Poor appetite ? ? 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 ?3/14-3/18 - patient admitted to [facility] for induction therapy with azacitidine and venetoclax. Noted volume overload on presentation and treated with IV diuresis during admission.  ? ?Past medical history includes prostate cancer s/p brachytherapy (2016), COPD, CAD s/p CABG, PVD, PAF, CVA, B12 deficiency, HTN, HLD, systolic CHF ? ?Met with patient and his "best friend" in infusion. Patient reports he doesn't get hungry, but is making himself eat 3 small meals/day. He had a sausage biscuit for breakfast, which is a typical breakfast for him. Patient recalls chicken pot pie for supper. Sometimes he will drink a Glucerna. He is drinking a lot of water, occasionally will have a sundrop. Patient denies constipation, diarrhea. Patient has intermittent nausea, reports needing to take zofran on 2 occassions. Patient reports one episode of vomiting since discharge. Per friend, he had taken medication on an empty stomach.  ? ? ?Nutrition Focused Physical Exam:  ?deferred ? ? ?Medications: Klor-con, acyclovir, tessalon, mucinex, levaquin, noxafil, flonase, lasix,  ? ? ?Labs: K 3.3, Glucose 104, BUN 24, Cr 1.65 ? ? ?Anthropometrics:  ? ?Height: 5' 0.24" ?Weight: 140 lb 3.2 oz  ?UBW: 135 lb (per pt - dry wt) ?BMI: 27.17 ? ? ?NUTRITION DIAGNOSIS: Inadequate oral intake related to chronic illness (COPD, CHF, and newly diagnosed AML) as evidenced by chronic shortness of breath, pt/family report of poor appetite and dietary recall.  ? ? ?INTERVENTION:  ?Educated on small frequent meals and snacks vs 3 larger meals day ?Encouraged soft, moist high protein foods for ease of intake - handout with ideas provided  ?Recommend drinking 1-2 Ensure Plus/equivalent day for added calories and protein ?One complimentary case of Ensure Plus High Protein provided today  ?Contact information provided  ? ? ?MONITORING, EVALUATION,  GOAL: weight trends, intake  ? ? ?Next Visit: Thursday April 20 during infusion  ? ? ? ? ? ?

## 2021-04-09 NOTE — Progress Notes (Signed)
? ?[facility] ?[address] ?[place], [place] [address] ? ? ?CLINIC:  ?Medical Oncology/Hematology ? ?PCP:  ?[name], MD ?[address] / [place] [address]  ?[phone] ? ?REASON FOR VISIT:  ?Follow-up for acute myeloid leukemia. ? ?PRIOR THERAPY: none ? ?CURRENT THERAPY: under work-up ? ?INTERVAL HISTORY:  ?[name], a 76 y.o. male, returns for routine follow-up for his AML. Vincent Carter was last seen on 03/03/2021. ? ?Today he reports feeling good. He denies fevers and chills. His appetite is poor, but he is eating well. His taste is unchanged. He denies current bleeding and skin rashes.  ? ?REVIEW OF SYSTEMS:  ?Review of Systems  ?Constitutional:  Negative for appetite change, chills, fatigue and fever.  ?HENT:   Positive for trouble swallowing. Negative for nosebleeds.   ?Respiratory:  Positive for shortness of breath. Negative for hemoptysis.   ?Gastrointestinal:  Negative for blood in stool.  ?Genitourinary:  Negative for hematuria.   ?Skin:  Negative for rash.  ?Neurological:  Positive for dizziness.  ?Hematological:  Does not bruise/bleed easily.  ?All other systems reviewed and are negative. ? ?PAST MEDICAL/SURGICAL HISTORY:  ?Past Medical History:  ?Diagnosis Date  ? Arthritis   ? ASCVD (arteriosclerotic cardiovascular disease) cardiologist--  is now dr Roderic Palau branch (was dr [name])   ? CABG in 1997 -- LIMA to LAD;  cath 07-17-2012 PCI and BMStenting;   RE-DO CABG X4  in  07-24-2004 three-vessel disease; cath in 08-24-2004 post CABG ef 40-45% w/ anteroapical hypokinesis; occlusion SVG  to  RI; patent RIMA to LAD; unsuccessful PCI in totally occluded RI graft.;  last myoview 09-09-2013, no ischemia, moderate inferior wall defect, ef 49%  ? CHF (congestive heart failure) ([place])   ? Chronic obstructive pulmonary disease (HCC)   ? Diverticulosis of colon   ? Euthyroid goiter   ? with low TSH   ? History of colon polyps   ? hyperplastic 2011  ? History of  diverticulitis   ? History of kidney stones   ? History of ST elevation myocardial infarction (STEMI)   ? 07-17-2004   w/ extensive RV infact per cath report  ? HOH (hard of hearing)   ? no hearing aid  ? Hypertension   ? Low TSH level   ? Clinically euthyroid  ? Peripheral vascular disease ([place])   ? Decreased distal pulses and abnormal Doppler  ? Prostate cancer [facility]) urologist--  dr Irine Seal  ? T1c, Gleason 3+4=7,  PSA 4.71,  vol 38cc  ? Ptosis   ? Left, secondary to injury  ? S/p bare metal coronary artery stent   ? x3  to RCA  07-17-2004  ? ?Past Surgical History:  ?Procedure Laterality Date  ? APPENDECTOMY  1973  ? CARDIAC CATHETERIZATION  08-24-2004  dr Lia Foyer  ? post re-do CABG/  Unsuccessful attempt PCI of clotted SVG to RI/  anteroapical segment severely hypokinetic,  ef 40-45%/  fresh occlusion SVG to RI,  continued patency of SVG to PAD and RIMA to LAD /   dLM 80%  ? CARDIOVASCULAR STRESS TEST  09-09-2013  dr Roderic Palau branch  ? no reversible ischemia,  moderately large defect noted in the inferior wall extending from  mid ventricle to apex also involves true ventricle apex and anteroapical wall/  hypokinesis of inferior wall in region of prior infarct/scarring/  LVEF 49%  ? CATARACT EXTRACTION W/ INTRAOCULAR LENS  IMPLANT, BILATERAL  2006  ? COLONOSCOPY W/ POLYPECTOMY  12-26-2009  ?  CORONARY ANGIOPLASTY WITH STENT PLACEMENT  07-17-2004   dr Sabino Snipes  ? PCI w/ BMS x3 to RCA and balloon pump placement/  LVSF after revascularization , ef 65% w/ mild inferior hypokinesis/  dLM  70% extending into origins of LAD (70% ostial, 80% mid, D1 branch 90% ostial)  CFX (80% ostial OM, 70% after first branch) /   99% mRCA stented and no residual;  remains 80% origin of posterior LV branch/  most apical portion of PDA was embolized   ? CORONARY ARTERY BYPASS GRAFT  1997   dr Merleen Nicely  ? LIMA to LAD  ? CORONARY ARTERY BYPASS GRAFT  07-24-2004  dr gerhardt  ? RIMA to LAD,  SVG to OM,  SVG to PDA,  SVG to [place]  ? repair ocular injury  ? ORIF RIGHT TIB-FIB FX  1990's  ? PROSTATE BIOPSY    ? RADIOACTIVE SEED IMPLANT N/A 05/02/2014  ? Procedure: RADIOACTIVE SEED IMPLANT;  Surgeon: Irine Seal, MD;  [facility];  Service: Urology;  Laterality: N/A;  ? TONSILLECTOMY  age 1  ? TOTAL HIP ARTHROPLASTY Left 1983  ? RECONSTRUCTION/ THA/  SKIN AND BONE GRAFTS--  pt fell from 5 stories off [place]  ? TRANSTHORACIC ECHOCARDIOGRAM  03-01-2013  ? diastolic dysfunction/  ef 45-50%/  mild AV calcification without stenosis,  mild AR/  mild LAE and RAE/  trivial MR and TR    ? ? ?SOCIAL HISTORY:  ?Social History  ? ?Socioeconomic History  ? Marital status: Widowed  ?  Spouse name: Not on file  ? Number of children: Not on file  ? Years of education: Not on file  ? Highest education level: Not on file  ?Occupational History  ? Occupation: Retired  ?Tobacco Use  ? Smoking status: Former  ?  Packs/day: 1.00  ?  Years: 55.00  ?  Pack years: 55.00  ?  Types: Cigarettes  ?  Start date: 08/13/1958  ?  Quit date: 04/12/2018  ?  Years since quitting: 2.9  ? Smokeless tobacco: Never  ?Vaping Use  ? Vaping Use: Former  ?Substance and Sexual Activity  ? Alcohol use: No  ?  Alcohol/week: 0.0 standard drinks  ? Drug use: No  ? Sexual activity: Not on file  ?Other Topics Concern  ? Not on file  ?Social History Narrative  ? Widowed  ? No regular exercise  ? ?Social Determinants of Health  ? ?Financial Resource Strain: Not on file  ?Food Insecurity: Not on file  ?Transportation Needs: Not on file  ?Physical Activity: Not on file  ?Stress: Not on file  ?Social Connections: Not on file  ?Intimate Partner Violence: Not on file  ? ? ?FAMILY HISTORY:  ?Family History  ?Problem Relation Age of Onset  ? Clotting disorder Mother   ? Heart attack Mother   ? Lung cancer Father   ? Cancer Brother   ?     prostate  ? ? ?CURRENT MEDICATIONS:  ?Current Outpatient Medications  ?Medication Sig Dispense Refill  ? acyclovir  (ZOVIRAX) 400 MG tablet Take 400 mg by mouth 2 (two) times daily.    ? benzonatate (TESSALON) 100 MG capsule Take 100 mg by mouth 3 (three) times daily as needed.    ? bumetanide (BUMEX) 1 MG tablet Take 1 mg by mouth daily.    ? Dextran 70-Hypromellose 0.1-0.3 % SOLN Apply 1 drop to eye every 4 (four) hours as needed.    ?  dextromethorphan-guaiFENesin (MUCINEX DM) 30-600 MG 12hr tablet Take 1 tablet by mouth 2 (two) times daily. (Patient taking differently: Take 1 tablet by mouth 2 (two) times daily as needed.) 30 tablet 0  ? fluticasone (FLONASE) 50 MCG/ACT nasal spray Place 2 sprays into both nostrils daily. 16 g 0  ? furosemide (LASIX) 20 MG tablet Take 1 tablet (20 mg total) by mouth daily. 90 tablet 3  ? guaiFENesin (MUCINEX) 600 MG 12 hr tablet Take by mouth.    ? ipratropium-albuterol (DUONEB) 0.5-2.5 (3) MG/3ML SOLN Inhale into the lungs.    ? levofloxacin (LEVAQUIN) 500 MG tablet Take 500 mg by mouth daily.    ? montelukast (SINGULAIR) 10 MG tablet TAKE 1 TABLET BY MOUTH AT BEDTIME 30 tablet 6  ? montelukast (SINGULAIR) 10 MG tablet Take by mouth.    ? Nebulizer MISC Please provide 1 nebulizer machine and all supplies. Diagnosis: J44.1, Office number: [phone] 1 each 0  ? Omega 3 1000 MG CAPS Take 1 capsule by mouth daily.    ? ondansetron (ZOFRAN) 4 MG tablet Take 8 mg by mouth every 8 (eight) hours as needed.    ? posaconazole (NOXAFIL) 100 MG TBEC delayed-release tablet Take by mouth.    ? potassium chloride SA (KLOR-CON M) 20 MEQ tablet Take by mouth.    ? SYMBICORT 80-4.5 MCG/ACT inhaler SMARTSIG:2 Puff(s) Via Inhaler Morning-Evening    ? venetoclax (VENCLEXTA) 100 MG tablet Take by mouth.    ? ?No current facility-administered medications for this visit.  ? ?Facility-Administered Medications Ordered in Other Visits  ?Medication Dose Route Frequency Provider Last Rate Last Admin  ? 0.9 %  sodium chloride infusion (Manually program via Guardrails IV Fluids)  250 mL Intravenous Once Derek Jack, MD      ? acetaminophen (TYLENOL) tablet 650 mg  650 mg Oral Once Derek Jack, MD      ? diphenhydrAMINE (BENADRYL) capsule 25 mg  25 mg Oral Once Derek Jack, MD      ? heparin lock flush 100

## 2021-04-09 NOTE — Progress Notes (Signed)
PICC flushed with good blood return noted. Labs drawn, No bruising or swelling at site. PICC dressing changed. Patient awaiting labs and Doctors visit. ?

## 2021-04-09 NOTE — Progress Notes (Signed)
CRITICAL VALUE ALERT ?Critical value received:  platelets 6 ANC 0.0 ?Date of notification:  04-09-2021 ?Time of notification: 09:36 am  ?Critical value read back:  Yes.   ?Nurse who received alert:  B. Denae Zulueta RN ?MD notified time and response:  Dr.Katragadda.   ?1 unit of Platelets ordered to infuse today.  ?

## 2021-04-09 NOTE — Patient Instructions (Signed)
[place] at [facility] ?Discharge Instructions ? ? ?You were seen and examined today by Dr. Delton Coombes. ? ?He reviewed your lab work.  Your platelet count is 6000.  We will give you a platelet transfusion today. ? ?We will continue to check your blood work twice a week and give you blood and platelets as needed. ? ?Return as scheduled to see Dr. Raliegh Ip in 4 weeks.  ? ? ?Thank you for choosing [place] at [facility] to provide your oncology and hematology care.  To afford each patient quality time with our provider, please arrive at least 15 minutes before your scheduled appointment time.  ? ?If you have a lab appointment with the [facility] please come in thru the Main Entrance and check in at the main information desk. ? ?You need to re-schedule your appointment should you arrive 10 or more minutes late.  We strive to give you quality time with our providers, and arriving late affects you and other patients whose appointments are after yours.  Also, if you no show three or more times for appointments you may be dismissed from the clinic at the providers discretion.     ?Again, thank you for choosing [facility].  Our hope is that these requests will decrease the amount of time that you wait before being seen by our physicians.       ?_____________________________________________________________ ? ?Should you have questions after your visit to [facility], please contact our office at [phone] and follow the prompts.  Our office hours are 8:00 a.m. and 4:30 p.m. Monday - Friday.  Please note that voicemails left after 4:00 p.m. may not be returned until the following business day.  We are closed weekends and major holidays.  You do have access to a nurse 24-7, just call the main number to the clinic [phone] and do not press any options, hold on the line and a nurse will answer the phone.   ? ?For prescription refill requests, have  your pharmacy contact our office and allow 72 hours.   ? ?Due to Covid, you will need to wear a mask upon entering the hospital. If you do not have a mask, a mask will be given to you at the Main Entrance upon arrival. For doctor visits, patients may have 1 support person age 74 or older with them. For treatment visits, patients can not have anyone with them due to social distancing guidelines and our immunocompromised population.  ? ?   ?

## 2021-04-09 NOTE — Progress Notes (Signed)
Patient presents today for platelets and IVF.  Patient is in satisfactory condition with no new complaints voiced.  Vital signs are stable.  Labs reviewed by Dr. Delton Coombes during his office visit.  Platelets today are 6.  We will proceed with platelets per MD orders.  ? ?Patient tolerated platelets well with no complaints voiced.  Patient left via wheelchair  in stable condition.  Vital signs stable at discharge.  Follow up as scheduled.    ?

## 2021-04-10 LAB — BPAM PLATELET PHERESIS
Blood Product Expiration Date: 202304012359
ISSUE DATE / TIME: 202303301119
Unit Type and Rh: 6200

## 2021-04-10 LAB — PREPARE PLATELET PHERESIS: Unit division: 0

## 2021-04-13 ENCOUNTER — Inpatient Hospital Stay (HOSPITAL_COMMUNITY): Payer: Medicare Other | Attending: Hematology

## 2021-04-13 ENCOUNTER — Inpatient Hospital Stay (HOSPITAL_COMMUNITY): Payer: Medicare Other

## 2021-04-13 VITALS — BP 95/55 | HR 69 | Temp 98.3°F | Resp 18

## 2021-04-13 VITALS — BP 76/48 | HR 75 | Temp 97.9°F | Resp 17

## 2021-04-13 DIAGNOSIS — C9200 Acute myeloblastic leukemia, not having achieved remission: Secondary | ICD-10-CM | POA: Diagnosis not present

## 2021-04-13 DIAGNOSIS — C61 Malignant neoplasm of prostate: Secondary | ICD-10-CM | POA: Diagnosis not present

## 2021-04-13 DIAGNOSIS — C9000 Multiple myeloma not having achieved remission: Secondary | ICD-10-CM

## 2021-04-13 DIAGNOSIS — C9500 Acute leukemia of unspecified cell type not having achieved remission: Secondary | ICD-10-CM

## 2021-04-13 LAB — COMPREHENSIVE METABOLIC PANEL
ALT: 13 U/L (ref 0–44)
AST: 18 U/L (ref 15–41)
Albumin: 2.5 g/dL — ABNORMAL LOW (ref 3.5–5.0)
Alkaline Phosphatase: 89 U/L (ref 38–126)
Anion gap: 9 (ref 5–15)
BUN: 34 mg/dL — ABNORMAL HIGH (ref 8–23)
CO2: 22 mmol/L (ref 22–32)
Calcium: 8.4 mg/dL — ABNORMAL LOW (ref 8.9–10.3)
Chloride: 104 mmol/L (ref 98–111)
Creatinine, Ser: 2.5 mg/dL — ABNORMAL HIGH (ref 0.61–1.24)
GFR, Estimated: 26 mL/min — ABNORMAL LOW (ref >60)
Glucose, Bld: 114 mg/dL — ABNORMAL HIGH (ref 70–99)
Potassium: 3 mmol/L — ABNORMAL LOW (ref 3.5–5.1)
Sodium: 135 mmol/L (ref 135–145)
Total Bilirubin: 2.9 mg/dL — ABNORMAL HIGH (ref 0.3–1.2)
Total Protein: 5.7 g/dL — ABNORMAL LOW (ref 6.5–8.1)

## 2021-04-13 LAB — CBC WITH DIFFERENTIAL/PLATELET
Basophils Absolute: 0 10*3/uL (ref 0.0–0.1)
Basophils Relative: 0 %
Blasts: 1 %
Eosinophils Absolute: 0 10*3/uL (ref 0.0–0.5)
Eosinophils Relative: 1 %
HCT: 22.9 % — ABNORMAL LOW (ref 39.0–52.0)
Hemoglobin: 7.5 g/dL — ABNORMAL LOW (ref 13.0–17.0)
Lymphocytes Relative: 72 %
Lymphs Abs: 1.2 10*3/uL (ref 0.7–4.0)
MCH: 30.7 pg (ref 26.0–34.0)
MCHC: 32.8 g/dL (ref 30.0–36.0)
MCV: 93.9 fL (ref 80.0–100.0)
Monocytes Absolute: 0.4 10*3/uL (ref 0.1–1.0)
Monocytes Relative: 21 %
Neutro Abs: 0.1 10*3/uL — CL (ref 1.7–7.7)
Neutrophils Relative %: 5 %
Platelets: 10 10*3/uL — CL (ref 150–400)
RBC: 2.44 MIL/uL — ABNORMAL LOW (ref 4.22–5.81)
RDW: 14.9 % (ref 11.5–15.5)
WBC: 1.7 10*3/uL — ABNORMAL LOW (ref 4.0–10.5)
nRBC: 0 % (ref 0.0–0.2)

## 2021-04-13 LAB — MAGNESIUM: Magnesium: 1.7 mg/dL (ref 1.7–2.4)

## 2021-04-13 LAB — SAMPLE TO BLOOD BANK

## 2021-04-13 LAB — PREPARE RBC (CROSSMATCH)

## 2021-04-13 MED ORDER — POTASSIUM CHLORIDE CRYS ER 20 MEQ PO TBCR
40.0000 meq | EXTENDED_RELEASE_TABLET | Freq: Once | ORAL | Status: AC
  Administered 2021-04-13: 40 meq via ORAL
  Filled 2021-04-13: qty 2

## 2021-04-13 MED ORDER — HEPARIN SOD (PORK) LOCK FLUSH 100 UNIT/ML IV SOLN: 500.0000 [IU] | Freq: Once | INTRAVENOUS | Status: DC

## 2021-04-13 MED ORDER — SODIUM CHLORIDE 0.9 % IV SOLN: Freq: Once | INTRAVENOUS | Status: AC

## 2021-04-13 MED ORDER — SODIUM CHLORIDE 0.9 % IV SOLN: INTRAVENOUS | Status: DC

## 2021-04-13 MED ORDER — HEPARIN SOD (PORK) LOCK FLUSH 100 UNIT/ML IV SOLN
250.0000 [IU] | INTRAVENOUS | Status: AC | PRN
  Administered 2021-04-13: 250 [IU]

## 2021-04-13 MED ORDER — SODIUM CHLORIDE 0.9% IV SOLUTION
250.0000 mL | Freq: Once | INTRAVENOUS | Status: AC
  Administered 2021-04-13: 250 mL via INTRAVENOUS

## 2021-04-13 MED ORDER — DIPHENHYDRAMINE HCL 25 MG PO CAPS
25.0000 mg | ORAL_CAPSULE | Freq: Once | ORAL | Status: AC
  Administered 2021-04-13: 25 mg via ORAL
  Filled 2021-04-13: qty 1

## 2021-04-13 MED ORDER — SODIUM CHLORIDE 0.9% FLUSH
10.0000 mL | INTRAVENOUS | Status: AC | PRN
  Administered 2021-04-13: 10 mL

## 2021-04-13 MED ORDER — ACETAMINOPHEN 325 MG PO TABS
650.0000 mg | ORAL_TABLET | Freq: Once | ORAL | Status: AC
  Administered 2021-04-13: 650 mg via ORAL
  Filled 2021-04-13: qty 2

## 2021-04-13 MED ORDER — SODIUM CHLORIDE 0.9% FLUSH
10.0000 mL | Freq: Once | INTRAVENOUS | Status: AC
  Administered 2021-04-13: 10 mL via INTRAVENOUS

## 2021-04-13 NOTE — Progress Notes (Signed)
CRITICAL VALUE ALERT ?Critical value received:  platelets 10 ?Date of notification:  04-13-21 ?Time of notification: 1050 ?Critical value read back:  Yes.   ?Nurse who received alert:  C. Raeley Gilmore RN ?MD notified time and response:  1055, see [facility] protocol orders. 2 units of blood and one unit of platelets.   ?

## 2021-04-13 NOTE — Patient Instructions (Signed)
[place]  Discharge Instructions: ?Thank you for choosing [facility] to provide your oncology and hematology care.  ?If you have a lab appointment with the [facility], please come in thru the Main Entrance and check in at the main information desk. ? ?Wear comfortable clothing and clothing appropriate for easy access to any Portacath or PICC line.  ? ?We strive to give you quality time with your provider. You may need to reschedule your appointment if you arrive late (15 or more minutes).  Arriving late affects you and other patients whose appointments are after yours.  Also, if you miss three or more appointments without notifying the office, you may be dismissed from the clinic at the provider?s discretion.    ?  ?For prescription refill requests, have your pharmacy contact our office and allow 72 hours for refills to be completed.   ? ?Today you received the following chemotherapy and/or immunotherapy agents blood and platelets    ?  ?To help prevent nausea and vomiting after your treatment, we encourage you to take your nausea medication as directed. ? ?BELOW ARE SYMPTOMS THAT SHOULD BE REPORTED IMMEDIATELY: ?*FEVER GREATER THAN 100.4 F (38 ?C) OR HIGHER ?*CHILLS OR SWEATING ?*NAUSEA AND VOMITING THAT IS NOT CONTROLLED WITH YOUR NAUSEA MEDICATION ?*UNUSUAL SHORTNESS OF BREATH ?*UNUSUAL BRUISING OR BLEEDING ?*URINARY PROBLEMS (pain or burning when urinating, or frequent urination) ?*BOWEL PROBLEMS (unusual diarrhea, constipation, pain near the anus) ?TENDERNESS IN MOUTH AND THROAT WITH OR WITHOUT PRESENCE OF ULCERS (sore throat, sores in mouth, or a toothache) ?UNUSUAL RASH, SWELLING OR PAIN  ?UNUSUAL VAGINAL DISCHARGE OR ITCHING  ? ?Items with * indicate a potential emergency and should be followed up as soon as possible or go to the Emergency Department if any problems should occur. ? ?Please show the CHEMOTHERAPY ALERT CARD or IMMUNOTHERAPY ALERT CARD at check-in to the  Emergency Department and triage nurse. ? ?Should you have questions after your visit or need to cancel or reschedule your appointment, please contact [facility] [phone]  and follow the prompts.  Office hours are 8:00 a.m. to 4:30 p.m. Monday - Friday. Please note that voicemails left after 4:00 p.m. may not be returned until the following business day.  We are closed weekends and major holidays. You have access to a nurse at all times for urgent questions. Please call the main number to the clinic [phone] and follow the prompts. ? ?For any non-urgent questions, you may also contact your provider using MyChart. We now offer e-Visits for anyone 13 and older to request care online for non-urgent symptoms. For details visit [email or web address]. ?  ?Also download the MyChart app! Go to the app store, search "MyChart", open the app, select [facility], and log in with your MyChart username and password. ? ?Due to Covid, a mask is required upon entering the hospital/clinic. If you do not have a mask, one will be given to you upon arrival. For doctor visits, patients may have 1 support person aged 27 or older with them. For treatment visits, patients cannot have anyone with them due to current Covid guidelines and our immunocompromised population.  ?

## 2021-04-13 NOTE — Progress Notes (Signed)
Patient presents today for possible blood products per providers order.  BP 76/48, MD notified and 250 cc bolus was ordered and given.  Hgb noted to be 7.5 and platelets 10.  Patient will receive 2UPRBC and 1U of platelets.   ? ?2UPRBC and 1U Platelets given today per MD orders.  Stable during infusion without adverse affects.  Vital signs stable.  No complaints at this time.  Discharge from clinic via wheelchair in stable condition.  Alert and oriented X 3.  Follow up with [facility] as scheduled.  ?

## 2021-04-14 DIAGNOSIS — C9200 Acute myeloblastic leukemia, not having achieved remission: Secondary | ICD-10-CM | POA: Diagnosis not present

## 2021-04-14 DIAGNOSIS — Z95828 Presence of other vascular implants and grafts: Secondary | ICD-10-CM | POA: Diagnosis not present

## 2021-04-14 DIAGNOSIS — I10 Essential (primary) hypertension: Secondary | ICD-10-CM | POA: Diagnosis not present

## 2021-04-14 DIAGNOSIS — D61818 Other pancytopenia: Secondary | ICD-10-CM | POA: Diagnosis not present

## 2021-04-14 DIAGNOSIS — D696 Thrombocytopenia, unspecified: Secondary | ICD-10-CM | POA: Diagnosis not present

## 2021-04-14 DIAGNOSIS — R7982 Elevated C-reactive protein (CRP): Secondary | ICD-10-CM | POA: Diagnosis not present

## 2021-04-14 DIAGNOSIS — I4891 Unspecified atrial fibrillation: Secondary | ICD-10-CM | POA: Diagnosis not present

## 2021-04-14 DIAGNOSIS — D61810 Antineoplastic chemotherapy induced pancytopenia: Secondary | ICD-10-CM | POA: Diagnosis not present

## 2021-04-14 DIAGNOSIS — R058 Other specified cough: Secondary | ICD-10-CM | POA: Diagnosis not present

## 2021-04-14 DIAGNOSIS — Z7951 Long term (current) use of inhaled steroids: Secondary | ICD-10-CM | POA: Diagnosis not present

## 2021-04-14 DIAGNOSIS — R600 Localized edema: Secondary | ICD-10-CM | POA: Diagnosis not present

## 2021-04-14 DIAGNOSIS — R944 Abnormal results of kidney function studies: Secondary | ICD-10-CM | POA: Diagnosis not present

## 2021-04-14 DIAGNOSIS — D709 Neutropenia, unspecified: Secondary | ICD-10-CM | POA: Diagnosis not present

## 2021-04-14 DIAGNOSIS — J449 Chronic obstructive pulmonary disease, unspecified: Secondary | ICD-10-CM | POA: Diagnosis not present

## 2021-04-14 DIAGNOSIS — Z79899 Other long term (current) drug therapy: Secondary | ICD-10-CM | POA: Diagnosis not present

## 2021-04-14 DIAGNOSIS — E876 Hypokalemia: Secondary | ICD-10-CM | POA: Diagnosis not present

## 2021-04-14 DIAGNOSIS — R110 Nausea: Secondary | ICD-10-CM | POA: Diagnosis not present

## 2021-04-14 DIAGNOSIS — R42 Dizziness and giddiness: Secondary | ICD-10-CM | POA: Diagnosis not present

## 2021-04-14 DIAGNOSIS — Z7969 Long term (current) use of other immunomodulators and immunosuppressants: Secondary | ICD-10-CM | POA: Diagnosis not present

## 2021-04-14 DIAGNOSIS — R112 Nausea with vomiting, unspecified: Secondary | ICD-10-CM | POA: Diagnosis not present

## 2021-04-14 LAB — PREPARE PLATELET PHERESIS: Unit division: 0

## 2021-04-14 LAB — BPAM RBC
Blood Product Expiration Date: 202304222359
Blood Product Expiration Date: 202305062359
ISSUE DATE / TIME: 202304031159
ISSUE DATE / TIME: 202304031333
Unit Type and Rh: 5100
Unit Type and Rh: 5100

## 2021-04-14 LAB — TYPE AND SCREEN
ABO/RH(D): O POS
Antibody Screen: NEGATIVE
Unit division: 0
Unit division: 0

## 2021-04-14 LAB — BPAM PLATELET PHERESIS
Blood Product Expiration Date: 202304052359
ISSUE DATE / TIME: 202304031509
Unit Type and Rh: 5100

## 2021-04-15 DIAGNOSIS — I110 Hypertensive heart disease with heart failure: Secondary | ICD-10-CM | POA: Diagnosis not present

## 2021-04-15 DIAGNOSIS — I5022 Chronic systolic (congestive) heart failure: Secondary | ICD-10-CM | POA: Diagnosis not present

## 2021-04-15 DIAGNOSIS — J449 Chronic obstructive pulmonary disease, unspecified: Secondary | ICD-10-CM | POA: Diagnosis not present

## 2021-04-15 DIAGNOSIS — I2510 Atherosclerotic heart disease of native coronary artery without angina pectoris: Secondary | ICD-10-CM | POA: Diagnosis not present

## 2021-04-16 ENCOUNTER — Encounter (HOSPITAL_COMMUNITY): Payer: Self-pay

## 2021-04-16 ENCOUNTER — Inpatient Hospital Stay (HOSPITAL_COMMUNITY): Payer: Medicare Other

## 2021-04-16 VITALS — BP 111/61 | HR 61 | Temp 98.0°F | Resp 18

## 2021-04-16 DIAGNOSIS — C9000 Multiple myeloma not having achieved remission: Secondary | ICD-10-CM

## 2021-04-16 DIAGNOSIS — Z452 Encounter for adjustment and management of vascular access device: Secondary | ICD-10-CM

## 2021-04-16 DIAGNOSIS — C9500 Acute leukemia of unspecified cell type not having achieved remission: Secondary | ICD-10-CM

## 2021-04-16 DIAGNOSIS — C9200 Acute myeloblastic leukemia, not having achieved remission: Secondary | ICD-10-CM | POA: Diagnosis not present

## 2021-04-16 LAB — CBC WITH DIFFERENTIAL/PLATELET
Basophils Absolute: 0 10*3/uL (ref 0.0–0.1)
Basophils Relative: 0 %
Eosinophils Absolute: 0 10*3/uL (ref 0.0–0.5)
Eosinophils Relative: 0 %
HCT: 25.9 % — ABNORMAL LOW (ref 39.0–52.0)
Hemoglobin: 8.6 g/dL — ABNORMAL LOW (ref 13.0–17.0)
Lymphocytes Relative: 68 %
Lymphs Abs: 0.8 10*3/uL (ref 0.7–4.0)
MCH: 30.7 pg (ref 26.0–34.0)
MCHC: 33.2 g/dL (ref 30.0–36.0)
MCV: 92.5 fL (ref 80.0–100.0)
Monocytes Absolute: 0.3 10*3/uL (ref 0.1–1.0)
Monocytes Relative: 28 %
Neutro Abs: 0 10*3/uL — CL (ref 1.7–7.7)
Neutrophils Relative %: 4 %
Platelets: 21 10*3/uL — CL (ref 150–400)
RBC: 2.8 MIL/uL — ABNORMAL LOW (ref 4.22–5.81)
RDW: 15.3 % (ref 11.5–15.5)
WBC: 1.2 10*3/uL — CL (ref 4.0–10.5)
nRBC: 0 % (ref 0.0–0.2)

## 2021-04-16 LAB — COMPREHENSIVE METABOLIC PANEL
ALT: 10 U/L (ref 0–44)
AST: 11 U/L — ABNORMAL LOW (ref 15–41)
Albumin: 2.4 g/dL — ABNORMAL LOW (ref 3.5–5.0)
Alkaline Phosphatase: 84 U/L (ref 38–126)
Anion gap: 5 (ref 5–15)
BUN: 32 mg/dL — ABNORMAL HIGH (ref 8–23)
CO2: 23 mmol/L (ref 22–32)
Calcium: 8.3 mg/dL — ABNORMAL LOW (ref 8.9–10.3)
Chloride: 109 mmol/L (ref 98–111)
Creatinine, Ser: 1.92 mg/dL — ABNORMAL HIGH (ref 0.61–1.24)
GFR, Estimated: 35 mL/min — ABNORMAL LOW (ref >60)
Glucose, Bld: 104 mg/dL — ABNORMAL HIGH (ref 70–99)
Potassium: 3.3 mmol/L — ABNORMAL LOW (ref 3.5–5.1)
Sodium: 137 mmol/L (ref 135–145)
Total Bilirubin: 2.3 mg/dL — ABNORMAL HIGH (ref 0.3–1.2)
Total Protein: 5.5 g/dL — ABNORMAL LOW (ref 6.5–8.1)

## 2021-04-16 LAB — PREPARE RBC (CROSSMATCH)

## 2021-04-16 LAB — MAGNESIUM: Magnesium: 2.1 mg/dL (ref 1.7–2.4)

## 2021-04-16 MED ORDER — SODIUM CHLORIDE 0.9% FLUSH
10.0000 mL | INTRAVENOUS | Status: AC | PRN
  Administered 2021-04-16: 10 mL

## 2021-04-16 MED ORDER — ACETAMINOPHEN 325 MG PO TABS
650.0000 mg | ORAL_TABLET | Freq: Once | ORAL | Status: AC
  Administered 2021-04-16: 650 mg via ORAL
  Filled 2021-04-16: qty 2

## 2021-04-16 MED ORDER — HEPARIN SOD (PORK) LOCK FLUSH 100 UNIT/ML IV SOLN
500.0000 [IU] | Freq: Every day | INTRAVENOUS | Status: AC | PRN
  Administered 2021-04-16: 500 [IU]

## 2021-04-16 MED ORDER — DIPHENHYDRAMINE HCL 25 MG PO CAPS
25.0000 mg | ORAL_CAPSULE | Freq: Once | ORAL | Status: AC
  Administered 2021-04-16: 25 mg via ORAL
  Filled 2021-04-16: qty 1

## 2021-04-16 MED ORDER — SODIUM CHLORIDE 0.9% FLUSH
10.0000 mL | Freq: Once | INTRAVENOUS | Status: AC
  Administered 2021-04-16: 10 mL via INTRAVENOUS

## 2021-04-16 MED ORDER — SODIUM CHLORIDE 0.9% IV SOLUTION
250.0000 mL | Freq: Once | INTRAVENOUS | Status: AC
  Administered 2021-04-16: 250 mL via INTRAVENOUS

## 2021-04-16 NOTE — Progress Notes (Signed)
Vincent Carter presented for PICC flush,lab draw and dressing change.  See IV assessment in docflowsheets for PICC details.    PICC located left arm.  Good blood return present. PICC flushed with 80m NS and 250U Heparin, see MAR for further details.  [name] procedure well and without incident.    ?

## 2021-04-16 NOTE — Progress Notes (Signed)
Patient presents today for lab work and possible blood products per [facility] guidelines. Patient has no complaints today. Patient denies any shortness of breath, dizziness, or chest pain. Patient states, " I feel much better than I did the other day." Labs pending.  ? ?HGB 8.8 and Platelets 21. Patient requests 1 unit of blood due to fatigue . [facility] guidelines infuse 1 unit of PRBC's if HGB < 9 and symptomatic.  ? ?CRITICAL VALUE ALERT ?Critical value received:  WBC 1.2 and Platelets 21 ?Date of notification:  04/16/2021 ?Time of notification: 09:15 am . ?Critical value read back:  Yes.   ?Nurse who received alert:  B.Bree Heinzelman Rn ?MD notified time and response:  Katragadda.   ? ?1 unit of blood given today per MD orders. Tolerated infusion without adverse affects. Vital signs stable. No complaints at this time. Discharged from clinic ambulatory in stable condition. Alert and oriented x 3. F/U with [facility] as scheduled.   ?

## 2021-04-16 NOTE — Patient Instructions (Signed)
[place]  Discharge Instructions: ?Thank you for choosing [facility] to provide your oncology and hematology care.  ?If you have a lab appointment with the [facility], please come in thru the Main Entrance and check in at the main information desk. ? ?Wear comfortable clothing and clothing appropriate for easy access to any Portacath or PICC line.  ? ?We strive to give you quality time with your provider. You may need to reschedule your appointment if you arrive late (15 or more minutes).  Arriving late affects you and other patients whose appointments are after yours.  Also, if you miss three or more appointments without notifying the office, you may be dismissed from the clinic at the provider?s discretion.    ?  ?For prescription refill requests, have your pharmacy contact our office and allow 72 hours for refills to be completed.   ? ?Today you received the following : 1 unit of PRBC's .    ?  ?To help prevent nausea and vomiting after your treatment, we encourage you to take your nausea medication as directed. ? ?BELOW ARE SYMPTOMS THAT SHOULD BE REPORTED IMMEDIATELY: ?*FEVER GREATER THAN 100.4 F (38 ?C) OR HIGHER ?*CHILLS OR SWEATING ?*NAUSEA AND VOMITING THAT IS NOT CONTROLLED WITH YOUR NAUSEA MEDICATION ?*UNUSUAL SHORTNESS OF BREATH ?*UNUSUAL BRUISING OR BLEEDING ?*URINARY PROBLEMS (pain or burning when urinating, or frequent urination) ?*BOWEL PROBLEMS (unusual diarrhea, constipation, pain near the anus) ?TENDERNESS IN MOUTH AND THROAT WITH OR WITHOUT PRESENCE OF ULCERS (sore throat, sores in mouth, or a toothache) ?UNUSUAL RASH, SWELLING OR PAIN  ?UNUSUAL VAGINAL DISCHARGE OR ITCHING  ? ?Items with * indicate a potential emergency and should be followed up as soon as possible or go to the Emergency Department if any problems should occur. ? ?Please show the CHEMOTHERAPY ALERT CARD or IMMUNOTHERAPY ALERT CARD at check-in to the Emergency Department and triage  nurse. ? ?Should you have questions after your visit or need to cancel or reschedule your appointment, please contact [facility] [phone]  and follow the prompts.  Office hours are 8:00 a.m. to 4:30 p.m. Monday - Friday. Please note that voicemails left after 4:00 p.m. may not be returned until the following business day.  We are closed weekends and major holidays. You have access to a nurse at all times for urgent questions. Please call the main number to the clinic [phone] and follow the prompts. ? ?For any non-urgent questions, you may also contact your provider using MyChart. We now offer e-Visits for anyone 71 and older to request care online for non-urgent symptoms. For details visit [email or web address]. ?  ?Also download the MyChart app! Go to the app store, search "MyChart", open the app, select [facility], and log in with your MyChart username and password. ? ?Due to Covid, a mask is required upon entering the hospital/clinic. If you do not have a mask, one will be given to you upon arrival. For doctor visits, patients may have 1 support person aged 40 or older with them. For treatment visits, patients cannot have anyone with them due to current Covid guidelines and our immunocompromised population.  ?

## 2021-04-17 LAB — TYPE AND SCREEN
ABO/RH(D): O POS
Antibody Screen: NEGATIVE
Unit division: 0

## 2021-04-17 LAB — BPAM RBC
Blood Product Expiration Date: 202305112359
ISSUE DATE / TIME: 202304061014
Unit Type and Rh: 5100

## 2021-04-20 ENCOUNTER — Inpatient Hospital Stay (HOSPITAL_COMMUNITY): Payer: Medicare Other

## 2021-04-20 VITALS — BP 119/68 | HR 61 | Temp 98.5°F | Resp 18

## 2021-04-20 DIAGNOSIS — C9500 Acute leukemia of unspecified cell type not having achieved remission: Secondary | ICD-10-CM

## 2021-04-20 DIAGNOSIS — C9000 Multiple myeloma not having achieved remission: Secondary | ICD-10-CM

## 2021-04-20 DIAGNOSIS — C9200 Acute myeloblastic leukemia, not having achieved remission: Secondary | ICD-10-CM | POA: Diagnosis not present

## 2021-04-20 LAB — CBC WITH DIFFERENTIAL/PLATELET
Abs Immature Granulocytes: 0.01 10*3/uL (ref 0.00–0.07)
Basophils Absolute: 0 10*3/uL (ref 0.0–0.1)
Basophils Relative: 0 %
Eosinophils Absolute: 0 10*3/uL (ref 0.0–0.5)
Eosinophils Relative: 0 %
HCT: 26.4 % — ABNORMAL LOW (ref 39.0–52.0)
Hemoglobin: 8.6 g/dL — ABNORMAL LOW (ref 13.0–17.0)
Immature Granulocytes: 1 %
Lymphocytes Relative: 60 %
Lymphs Abs: 0.7 10*3/uL (ref 0.7–4.0)
MCH: 30.2 pg (ref 26.0–34.0)
MCHC: 32.6 g/dL (ref 30.0–36.0)
MCV: 92.6 fL (ref 80.0–100.0)
Monocytes Absolute: 0.4 10*3/uL (ref 0.1–1.0)
Monocytes Relative: 36 %
Neutro Abs: 0 10*3/uL — CL (ref 1.7–7.7)
Neutrophils Relative %: 3 %
Platelets: 31 10*3/uL — ABNORMAL LOW (ref 150–400)
RBC: 2.85 MIL/uL — ABNORMAL LOW (ref 4.22–5.81)
RDW: 14.7 % (ref 11.5–15.5)
WBC: 1.1 10*3/uL — CL (ref 4.0–10.5)
nRBC: 0 % (ref 0.0–0.2)

## 2021-04-20 LAB — PREPARE RBC (CROSSMATCH)

## 2021-04-20 LAB — COMPREHENSIVE METABOLIC PANEL
ALT: 10 U/L (ref 0–44)
AST: 12 U/L — ABNORMAL LOW (ref 15–41)
Albumin: 2.5 g/dL — ABNORMAL LOW (ref 3.5–5.0)
Alkaline Phosphatase: 93 U/L (ref 38–126)
Anion gap: 5 (ref 5–15)
BUN: 24 mg/dL — ABNORMAL HIGH (ref 8–23)
CO2: 21 mmol/L — ABNORMAL LOW (ref 22–32)
Calcium: 8.5 mg/dL — ABNORMAL LOW (ref 8.9–10.3)
Chloride: 111 mmol/L (ref 98–111)
Creatinine, Ser: 1.68 mg/dL — ABNORMAL HIGH (ref 0.61–1.24)
GFR, Estimated: 42 mL/min — ABNORMAL LOW (ref >60)
Glucose, Bld: 109 mg/dL — ABNORMAL HIGH (ref 70–99)
Potassium: 4.2 mmol/L (ref 3.5–5.1)
Sodium: 137 mmol/L (ref 135–145)
Total Bilirubin: 2 mg/dL — ABNORMAL HIGH (ref 0.3–1.2)
Total Protein: 5.6 g/dL — ABNORMAL LOW (ref 6.5–8.1)

## 2021-04-20 LAB — SAMPLE TO BLOOD BANK

## 2021-04-20 LAB — MAGNESIUM: Magnesium: 2 mg/dL (ref 1.7–2.4)

## 2021-04-20 MED ORDER — DIPHENHYDRAMINE HCL 25 MG PO CAPS
25.0000 mg | ORAL_CAPSULE | Freq: Once | ORAL | Status: AC
  Administered 2021-04-20: 25 mg via ORAL
  Filled 2021-04-20: qty 1

## 2021-04-20 MED ORDER — ACETAMINOPHEN 325 MG PO TABS
650.0000 mg | ORAL_TABLET | Freq: Once | ORAL | Status: AC
  Administered 2021-04-20: 650 mg via ORAL
  Filled 2021-04-20: qty 2

## 2021-04-20 MED ORDER — SODIUM CHLORIDE 0.9% IV SOLUTION
250.0000 mL | Freq: Once | INTRAVENOUS | Status: AC
  Administered 2021-04-20: 250 mL via INTRAVENOUS

## 2021-04-20 MED ORDER — HEPARIN SOD (PORK) LOCK FLUSH 100 UNIT/ML IV SOLN: 250.0000 [IU] | INTRAVENOUS | Status: DC | PRN

## 2021-04-20 MED ORDER — SODIUM CHLORIDE 0.9% FLUSH: 10.0000 mL | INTRAVENOUS | Status: DC | PRN

## 2021-04-20 NOTE — Progress Notes (Signed)
CRITICAL VALUE ALERT ?Critical value received:  WBC 1.1, ANC 0.0 ?Date of notification:  04-20-21 ?Time of notification: 1234 ?Critical value read back:  Yes.   ?Nurse who received alert:  C. Darnelle Derrick RN ?MD notified time and response:  9480, will give one unit of blood per orders.   ?

## 2021-04-21 DIAGNOSIS — I5022 Chronic systolic (congestive) heart failure: Secondary | ICD-10-CM | POA: Diagnosis not present

## 2021-04-21 DIAGNOSIS — G9340 Encephalopathy, unspecified: Secondary | ICD-10-CM | POA: Diagnosis not present

## 2021-04-21 DIAGNOSIS — Z781 Physical restraint status: Secondary | ICD-10-CM | POA: Diagnosis not present

## 2021-04-21 DIAGNOSIS — G9389 Other specified disorders of brain: Secondary | ICD-10-CM | POA: Diagnosis not present

## 2021-04-21 DIAGNOSIS — I739 Peripheral vascular disease, unspecified: Secondary | ICD-10-CM | POA: Diagnosis not present

## 2021-04-21 DIAGNOSIS — E785 Hyperlipidemia, unspecified: Secondary | ICD-10-CM | POA: Diagnosis not present

## 2021-04-21 DIAGNOSIS — E876 Hypokalemia: Secondary | ICD-10-CM | POA: Diagnosis not present

## 2021-04-21 DIAGNOSIS — Z66 Do not resuscitate: Secondary | ICD-10-CM | POA: Diagnosis not present

## 2021-04-21 DIAGNOSIS — R5081 Fever presenting with conditions classified elsewhere: Secondary | ICD-10-CM | POA: Diagnosis not present

## 2021-04-21 DIAGNOSIS — T451X5A Adverse effect of antineoplastic and immunosuppressive drugs, initial encounter: Secondary | ICD-10-CM | POA: Diagnosis not present

## 2021-04-21 DIAGNOSIS — C9590 Leukemia, unspecified not having achieved remission: Secondary | ICD-10-CM | POA: Diagnosis not present

## 2021-04-21 DIAGNOSIS — I482 Chronic atrial fibrillation, unspecified: Secondary | ICD-10-CM | POA: Diagnosis not present

## 2021-04-21 DIAGNOSIS — I5020 Unspecified systolic (congestive) heart failure: Secondary | ICD-10-CM | POA: Diagnosis not present

## 2021-04-21 DIAGNOSIS — I472 Ventricular tachycardia, unspecified: Secondary | ICD-10-CM | POA: Diagnosis not present

## 2021-04-21 DIAGNOSIS — I4891 Unspecified atrial fibrillation: Secondary | ICD-10-CM | POA: Diagnosis not present

## 2021-04-21 DIAGNOSIS — C9200 Acute myeloblastic leukemia, not having achieved remission: Secondary | ICD-10-CM | POA: Diagnosis not present

## 2021-04-21 DIAGNOSIS — Z951 Presence of aortocoronary bypass graft: Secondary | ICD-10-CM | POA: Diagnosis not present

## 2021-04-21 DIAGNOSIS — Z7901 Long term (current) use of anticoagulants: Secondary | ICD-10-CM | POA: Diagnosis not present

## 2021-04-21 DIAGNOSIS — R570 Cardiogenic shock: Secondary | ICD-10-CM | POA: Diagnosis not present

## 2021-04-21 DIAGNOSIS — D696 Thrombocytopenia, unspecified: Secondary | ICD-10-CM | POA: Diagnosis not present

## 2021-04-21 DIAGNOSIS — I5021 Acute systolic (congestive) heart failure: Secondary | ICD-10-CM | POA: Diagnosis not present

## 2021-04-21 DIAGNOSIS — J9601 Acute respiratory failure with hypoxia: Secondary | ICD-10-CM | POA: Diagnosis not present

## 2021-04-21 DIAGNOSIS — N189 Chronic kidney disease, unspecified: Secondary | ICD-10-CM | POA: Diagnosis not present

## 2021-04-21 DIAGNOSIS — Z8674 Personal history of sudden cardiac arrest: Secondary | ICD-10-CM | POA: Diagnosis not present

## 2021-04-21 DIAGNOSIS — I130 Hypertensive heart and chronic kidney disease with heart failure and stage 1 through stage 4 chronic kidney disease, or unspecified chronic kidney disease: Secondary | ICD-10-CM | POA: Diagnosis not present

## 2021-04-21 DIAGNOSIS — R29898 Other symptoms and signs involving the musculoskeletal system: Secondary | ICD-10-CM | POA: Diagnosis not present

## 2021-04-21 DIAGNOSIS — Z5111 Encounter for antineoplastic chemotherapy: Secondary | ICD-10-CM | POA: Diagnosis not present

## 2021-04-21 DIAGNOSIS — J449 Chronic obstructive pulmonary disease, unspecified: Secondary | ICD-10-CM | POA: Diagnosis not present

## 2021-04-21 DIAGNOSIS — Z9911 Dependence on respirator [ventilator] status: Secondary | ICD-10-CM | POA: Diagnosis not present

## 2021-04-21 DIAGNOSIS — Z8673 Personal history of transient ischemic attack (TIA), and cerebral infarction without residual deficits: Secondary | ICD-10-CM | POA: Diagnosis not present

## 2021-04-21 DIAGNOSIS — I4901 Ventricular fibrillation: Secondary | ICD-10-CM | POA: Diagnosis not present

## 2021-04-21 DIAGNOSIS — D61818 Other pancytopenia: Secondary | ICD-10-CM | POA: Diagnosis not present

## 2021-04-21 DIAGNOSIS — Z515 Encounter for palliative care: Secondary | ICD-10-CM | POA: Diagnosis not present

## 2021-04-21 DIAGNOSIS — I2510 Atherosclerotic heart disease of native coronary artery without angina pectoris: Secondary | ICD-10-CM | POA: Diagnosis not present

## 2021-04-21 DIAGNOSIS — I351 Nonrheumatic aortic (valve) insufficiency: Secondary | ICD-10-CM | POA: Diagnosis not present

## 2021-04-21 DIAGNOSIS — Z955 Presence of coronary angioplasty implant and graft: Secondary | ICD-10-CM | POA: Diagnosis not present

## 2021-04-21 DIAGNOSIS — I462 Cardiac arrest due to underlying cardiac condition: Secondary | ICD-10-CM | POA: Diagnosis not present

## 2021-04-21 DIAGNOSIS — I469 Cardiac arrest, cause unspecified: Secondary | ICD-10-CM | POA: Diagnosis not present

## 2021-04-21 DIAGNOSIS — D709 Neutropenia, unspecified: Secondary | ICD-10-CM | POA: Diagnosis not present

## 2021-04-21 DIAGNOSIS — Z96642 Presence of left artificial hip joint: Secondary | ICD-10-CM | POA: Diagnosis not present

## 2021-04-21 LAB — TYPE AND SCREEN
ABO/RH(D): O POS
Antibody Screen: NEGATIVE
Unit division: 0

## 2021-04-21 LAB — BPAM RBC
Blood Product Expiration Date: 202305132359
ISSUE DATE / TIME: 202304101332
Unit Type and Rh: 5100

## 2021-04-23 ENCOUNTER — Inpatient Hospital Stay (HOSPITAL_COMMUNITY): Payer: Medicare Other

## 2021-04-27 ENCOUNTER — Inpatient Hospital Stay (HOSPITAL_COMMUNITY): Payer: Medicare Other

## 2021-04-30 ENCOUNTER — Encounter (HOSPITAL_COMMUNITY): Payer: Medicare Other | Admitting: Dietician

## 2021-04-30 ENCOUNTER — Inpatient Hospital Stay (HOSPITAL_COMMUNITY): Payer: Medicare Other

## 2021-05-04 ENCOUNTER — Inpatient Hospital Stay (HOSPITAL_COMMUNITY): Payer: Medicare Other

## 2021-05-07 ENCOUNTER — Inpatient Hospital Stay (HOSPITAL_COMMUNITY): Payer: Medicare Other

## 2021-05-07 ENCOUNTER — Inpatient Hospital Stay (HOSPITAL_COMMUNITY): Payer: Medicare Other | Admitting: Hematology

## 2021-05-10 DIAGNOSIS — J449 Chronic obstructive pulmonary disease, unspecified: Secondary | ICD-10-CM | POA: Diagnosis not present

## 2021-05-10 DIAGNOSIS — I5022 Chronic systolic (congestive) heart failure: Secondary | ICD-10-CM | POA: Diagnosis not present

## 2021-05-10 DIAGNOSIS — I110 Hypertensive heart disease with heart failure: Secondary | ICD-10-CM | POA: Diagnosis not present

## 2021-05-10 DIAGNOSIS — I2510 Atherosclerotic heart disease of native coronary artery without angina pectoris: Secondary | ICD-10-CM | POA: Diagnosis not present

## 2021-05-11 DEATH — deceased
